# Patient Record
Sex: Female | Born: 1995 | Race: Black or African American | Hispanic: No | Marital: Single | Smoking: Never smoker
Health system: Southern US, Community
[De-identification: ages and names within clinical notes are randomized; demographics above are authoritative.]

## PROBLEM LIST (undated history)

## (undated) ENCOUNTER — Inpatient Hospital Stay (HOSPITAL_COMMUNITY): Payer: Self-pay

## (undated) ENCOUNTER — Inpatient Hospital Stay (HOSPITAL_COMMUNITY): Payer: BC Managed Care – PPO

## (undated) DIAGNOSIS — O039 Complete or unspecified spontaneous abortion without complication: Secondary | ICD-10-CM

## (undated) DIAGNOSIS — F32A Depression, unspecified: Secondary | ICD-10-CM

## (undated) DIAGNOSIS — O210 Mild hyperemesis gravidarum: Secondary | ICD-10-CM

## (undated) DIAGNOSIS — I10 Essential (primary) hypertension: Secondary | ICD-10-CM

## (undated) DIAGNOSIS — E0590 Thyrotoxicosis, unspecified without thyrotoxic crisis or storm: Secondary | ICD-10-CM

## (undated) HISTORY — DX: Complete or unspecified spontaneous abortion without complication: O03.9

---
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
## 2014-06-12 ENCOUNTER — Other Ambulatory Visit: Payer: Self-pay | Admitting: Obstetrics and Gynecology

## 2014-06-12 DIAGNOSIS — O3680X0 Pregnancy with inconclusive fetal viability, not applicable or unspecified: Secondary | ICD-10-CM

## 2014-06-15 ENCOUNTER — Other Ambulatory Visit: Payer: Self-pay

## 2014-06-16 ENCOUNTER — Ambulatory Visit: Payer: Self-pay

## 2014-06-22 ENCOUNTER — Ambulatory Visit (INDEPENDENT_AMBULATORY_CARE_PROVIDER_SITE_OTHER): Payer: BLUE CROSS/BLUE SHIELD

## 2014-06-22 ENCOUNTER — Encounter: Payer: Self-pay | Admitting: Women's Health

## 2014-06-22 ENCOUNTER — Other Ambulatory Visit: Payer: Self-pay | Admitting: Obstetrics and Gynecology

## 2014-06-22 ENCOUNTER — Ambulatory Visit (INDEPENDENT_AMBULATORY_CARE_PROVIDER_SITE_OTHER): Payer: BLUE CROSS/BLUE SHIELD | Admitting: Women's Health

## 2014-06-22 VITALS — BP 128/70 | Wt 145.0 lb

## 2014-06-22 DIAGNOSIS — O3680X0 Pregnancy with inconclusive fetal viability, not applicable or unspecified: Secondary | ICD-10-CM

## 2014-06-22 DIAGNOSIS — O021 Missed abortion: Secondary | ICD-10-CM

## 2014-06-22 NOTE — Patient Instructions (Signed)
Miscarriage A miscarriage is the sudden loss of an unborn baby (fetus) before the 20th week of pregnancy. Most miscarriages happen in the first 3 months of pregnancy. Sometimes, it happens before a woman even knows she is pregnant. A miscarriage is also called a "spontaneous miscarriage" or "early pregnancy loss." Having a miscarriage can be an emotional experience. Talk with your caregiver about any questions you may have about miscarrying, the grieving process, and your future pregnancy plans. CAUSES   Problems with the fetal chromosomes that make it impossible for the baby to develop normally. Problems with the baby's genes or chromosomes are most often the result of errors that occur, by chance, as the embryo divides and grows. The problems are not inherited from the parents.  Infection of the cervix or uterus.   Hormone problems.   Problems with the cervix, such as having an incompetent cervix. This is when the tissue in the cervix is not strong enough to hold the pregnancy.   Problems with the uterus, such as an abnormally shaped uterus, uterine fibroids, or congenital abnormalities.   Certain medical conditions.   Smoking, drinking alcohol, or taking illegal drugs.   Trauma.  Often, the cause of a miscarriage is unknown.  SYMPTOMS   Vaginal bleeding or spotting, with or without cramps or pain.  Pain or cramping in the abdomen or lower back.  Passing fluid, tissue, or blood clots from the vagina. DIAGNOSIS  Your caregiver will perform a physical exam. You may also have an ultrasound to confirm the miscarriage. Blood or urine tests may also be ordered. TREATMENT   Sometimes, treatment is not necessary if you naturally pass all the fetal tissue that was in the uterus. If some of the fetus or placenta remains in the body (incomplete miscarriage), tissue left behind may become infected and must be removed. Usually, a dilation and curettage (D and C) procedure is performed.  During a D and C procedure, the cervix is widened (dilated) and any remaining fetal or placental tissue is gently removed from the uterus.  Antibiotic medicines are prescribed if there is an infection. Other medicines may be given to reduce the size of the uterus (contract) if there is a lot of bleeding.  If you have Rh negative blood and your baby was Rh positive, you will need a Rh immunoglobulin shot. This shot will protect any future baby from having Rh blood problems in future pregnancies. HOME CARE INSTRUCTIONS   Your caregiver may order bed rest or may allow you to continue light activity. Resume activity as directed by your caregiver.  Have someone help with home and family responsibilities during this time.   Keep track of the number of sanitary pads you use each day and how soaked (saturated) they are. Write down this information.   Do not use tampons. Do not douche or have sexual intercourse until approved by your caregiver.   Only take over-the-counter or prescription medicines for pain or discomfort as directed by your caregiver.   Do not take aspirin. Aspirin can cause bleeding.   Keep all follow-up appointments with your caregiver.   If you or your partner have problems with grieving, talk to your caregiver or seek counseling to help cope with the pregnancy loss. Allow enough time to grieve before trying to get pregnant again.  SEEK IMMEDIATE MEDICAL CARE IF:   You have severe cramps or pain in your back or abdomen.  You have a fever.  You pass large blood clots (walnut-sized   or larger) ortissue from your vagina. Save any tissue for your caregiver to inspect.   Your bleeding increases.   You have a thick, bad-smelling vaginal discharge.  You become lightheaded, weak, or you faint.   You have chills.  MAKE SURE YOU:  Understand these instructions.  Will watch your condition.  Will get help right away if you are not doing well or get  worse. Document Released: 10/11/2000 Document Revised: 08/12/2012 Document Reviewed: 06/06/2011 Exit[employer] Patient Information 2015 [employer]. This information is not intended to replace advice given to you by your health care provider. Make sure you discuss any questions you have with your health care provider.  

## 2014-06-22 NOTE — Progress Notes (Signed)
Patient ID: Deborah Evans, female   DOB: Nov 08, 1995, 19 y.o.   MRN: [id number]   [facility]  Patient name: Deborah Evans MRN [id number]  Date of birth: Nov 08, 1995  CC & HPI:  Deborah Evans is a 19 y.o. G1P0 African American female at 7022w0d by certain LMP here today for dating u/s and fetus was found to have no heartbeat. CRL measured 9524w1d. Denies n/v, cramping, vb. Does desire pregnancy. Has been taking pnv.   Pertinent History Reviewed:  Medical & Surgical Hx:   Past Medical History  Diagnosis Date  . Medical history non-contributory    Past Surgical History  Procedure Laterality Date  . No past surgeries     Medications: Reviewed & Updated - see associated section Social History: Reviewed -  reports that she has never smoked. She does not have any smokeless tobacco history on file.  Objective Findings:  Vitals: BP 128/70 mmHg  Wt 145 lb (65.772 kg)  LMP 04/27/2014 (Exact Date)  Physical Examination: General appearance - alert, well appearing, and in no distress Abdomen - soft, nontender  No results found for this or any previous visit (from the past 24 hour(s)).   Assessment & Plan:  A:   G1P0010  7824w1d by CRL  Missed Ab P:  CBC, ABO, HCG today   F/U 1d for visit w/ MD to discuss/schedule D&C  Continue pnv  Wait 3 months before trying to conceive  Marge DuncansBooker, Kimberly Randall CNM, [facility]NP-BC 06/22/2014 3:38 PM

## 2014-06-22 NOTE — Progress Notes (Signed)
This encounter was created in error - please disregard.

## 2014-06-23 ENCOUNTER — Ambulatory Visit (INDEPENDENT_AMBULATORY_CARE_PROVIDER_SITE_OTHER): Payer: BLUE CROSS/BLUE SHIELD | Admitting: Obstetrics & Gynecology

## 2014-06-23 ENCOUNTER — Telehealth: Payer: Self-pay | Admitting: *Deleted

## 2014-06-23 ENCOUNTER — Encounter: Payer: Self-pay | Admitting: Obstetrics & Gynecology

## 2014-06-23 VITALS — BP 116/70 | HR 72 | Wt 143.0 lb

## 2014-06-23 DIAGNOSIS — O021 Missed abortion: Secondary | ICD-10-CM | POA: Diagnosis not present

## 2014-06-23 LAB — HCG, QUANTITATIVE, PREGNANCY: hCG Quant: 89411 m[IU]/mL

## 2014-06-23 LAB — CBC
HEMATOCRIT: 38.3 % (ref 34.0–46.6)
Hemoglobin: 12.2 g/dL (ref 11.1–15.9)
MCH: 25.7 pg — ABNORMAL LOW (ref 26.6–33.0)
MCHC: 31.9 g/dL (ref 31.5–35.7)
MCV: 81 fL (ref 79–97)
PLATELETS: 236 10*3/uL (ref 150–379)
RBC: 4.74 x10E6/uL (ref 3.77–5.28)
RDW: 13.7 % (ref 12.3–15.4)
WBC: 6.4 10*3/uL (ref 3.4–10.8)

## 2014-06-23 LAB — ABO/RH: RH TYPE: POSITIVE

## 2014-06-23 MED ORDER — MISOPROSTOL 200 MCG PO TABS: ORAL_TABLET | ORAL | Status: DC

## 2014-06-23 MED ORDER — HYDROCODONE-ACETAMINOPHEN 5-325 MG PO TABS: 1.0000 | ORAL_TABLET | Freq: Four times a day (QID) | ORAL | Status: DC | PRN

## 2014-06-23 NOTE — Progress Notes (Signed)
Patient ID: Deborah Evans, female   DOB: [birth year], 19 y.o.   MRN: [id number] Chief Complaint  Patient presents with  . Pre-op Exam    missed abortion.     19 y.o. female G1P0 9 weeks 1d non viable  Fetus by CRL by sonogram on 06/22/2014 The patient is not currently bleeding or having any pain  She is without complaints today she is without complaints today  The patient has been brought back in today to discuss her options for management of the pregnancy loss.  She was Seen yesterday by Shawna Clamp CNM for her initial OB visit at which time she was informed that she had an early pregnancy loss. Her initial thought was to have a surgical removal by D&C.  Today we discussed all the options.  I informed the patient that spontaneous miscarriage she is either truly spontaneous or by use of Cytotec carries with it and improved pregnancy outcome for future pregnancies including preterm delivery preterm labor and pregnancy loss as compared to operative pregnancy evacuations. I discussed with the patient the complications of an operative D&C which include but are not limited to infection, Asherman syndrome, perforation, and delayed excessive bleeding.  She understands at this gestation there is about a 10% chance of needing a D&C anyway.  We discussed the use of Cytotec versus waiting for spontaneous pregnancy loss.  Questions were answered regarding these options.  Patient at the time of the visit was unsure which pass she would take as far as spontaneous versus Cytotec As a result I prescribed Cytotec 800 g with 1 refill if she decides to go that route as well as Lortab as needed for cramping.  I told her she could also use Tylenol but I would recommend not using prostaglandin inhibitors because that's what the Cytotec is actually doing which could limit its effectiveness  The sonogram images are reviewed as well as the sonogram report The patient's labs are reviewed and listed below including a  blood type of O+ screen she does not require RhoGAM  Results for orders placed or performed in visit on 06/22/14 (from the past 72 hour(s))  CBC     Status: Abnormal   Collection Time: 06/22/14  3:44 PM  Result Value Ref Range   WBC 6.4 3.4 - 10.8 x10E3/uL   RBC 4.74 3.77 - 5.28 x10E6/uL   Hemoglobin 12.2 11.1 - 15.9 g/dL   HCT 40.9 81.1 - 91.4 %   MCV 81 79 - 97 fL   MCH 25.7 (L) 26.6 - 33.0 pg   MCHC 31.9 31.5 - 35.7 g/dL   RDW 78.2 95.6 - 21.3 %   Platelets 236 150 - 379 x10E3/uL  hCG, quantitative, pregnancy     Status: None   Collection Time: 06/22/14  3:44 PM  Result Value Ref Range   hCG Quant 08657 mIU/mL    Comment:                      Female (Non-pregnant)    0 -     5                             (Postmenopausal)  0 -     8                      Female (Pregnant)  Weeks of Gestation                              3                6 -    71                              4               10 -   750                              5              217 -  7138                              6              158 - [place]                              7             3697 -[place]  18             8099 - 58[address] Roche E CLIA methodology   ABO/Rh     Status: None   Collection Time: 06/22/14  3:44 PM  Result Value Ref Range   ABO Grouping O    Rh Factor Positive     Comment: Please note: Prior records for this patient's ABO / Rh type are not available for additional verification.    Past Medical  History  Diagnosis Date  . Medical history non-contributory     Past Surgical History  Procedure Laterality Date  . No past surgeries      OB History    Gravida Para Term Preterm AB TAB SAB Ectopic Multiple Living   1               No Known Allergies  History   Social History  . Marital Status: Single    Spouse Name: N/A  . Number of Children: N/A  . Years of Education: N/A   Social History Main Topics  . Smoking status: Never Smoker   . Smokeless tobacco: Not on file  . Alcohol Use: No  . Drug Use: No  . Sexual Activity: Not on file   Other Topics Concern  . None   Social History Narrative    History reviewed. No pertinent family history.  Impression: Missed ab at 9167w1d  Plan: cytotec as above lortab prn  follow up 2 weeks or earlier if needed  Face-to-face time 20 minutes Greater than 50% of the time with the patient was spent in counseling and answering questions regarding management which is summarized above

## 2014-06-23 NOTE — Telephone Encounter (Signed)
Pt wanted to know what pharmacy her medication was sent to.

## 2014-07-07 ENCOUNTER — Encounter: Payer: Self-pay | Admitting: Obstetrics & Gynecology

## 2014-07-07 ENCOUNTER — Other Ambulatory Visit: Payer: Self-pay | Admitting: Obstetrics & Gynecology

## 2014-07-07 ENCOUNTER — Ambulatory Visit (INDEPENDENT_AMBULATORY_CARE_PROVIDER_SITE_OTHER): Payer: BLUE CROSS/BLUE SHIELD | Admitting: Obstetrics & Gynecology

## 2014-07-07 ENCOUNTER — Ambulatory Visit (INDEPENDENT_AMBULATORY_CARE_PROVIDER_SITE_OTHER): Payer: BLUE CROSS/BLUE SHIELD

## 2014-07-07 VITALS — BP 130/80 | HR 84 | Wt 143.0 lb

## 2014-07-07 DIAGNOSIS — O021 Missed abortion: Secondary | ICD-10-CM

## 2014-07-07 DIAGNOSIS — O0339 Incomplete spontaneous abortion with other complications: Secondary | ICD-10-CM | POA: Insufficient documentation

## 2014-07-07 DIAGNOSIS — N719 Inflammatory disease of uterus, unspecified: Secondary | ICD-10-CM | POA: Insufficient documentation

## 2014-07-07 MED ORDER — CIPROFLOXACIN HCL 500 MG PO TABS: 500.0000 mg | ORAL_TABLET | Freq: Two times a day (BID) | ORAL | Status: DC

## 2014-07-07 NOTE — Progress Notes (Signed)
Patient ID: Deborah Evans, female   DOB: [birth year], 19 y.o.   MRN: [id number]   Chief Complaint  Patient presents with  . Follow-up     HPI:    Had missed ab status post cytotec.  Experienced bleeding and cramping and passage of tissue after the cytotec, bleeding lasted 5 days  Location:  Vaginal . Quality:  cramps. Severity:  mild. Timing:  daily Duration:  5 days. Context:  After cytotec. Modifying factors:   Signs/Symptoms:    Problem Pertinent ROS:       No burning with urination, frequency or urgency No nausea, vomiting or diarrhea Nor fever chills or other constitutional symptoms No back or abdominal pain   Extended ROS:        PMFSH:             Past Medical History  Diagnosis Date  . Medical history non-contributory     Past Surgical History  Procedure Laterality Date  . No past surgeries      OB History    Gravida Para Term Preterm AB TAB SAB Ectopic Multiple Living   1               No Known Allergies  History   Social History  . Marital Status: Single    Spouse Name: N/A  . Number of Children: N/A  . Years of Education: N/A   Social History Main Topics  . Smoking status: Never Smoker   . Smokeless tobacco: Not on file  . Alcohol Use: No  . Drug Use: No  . Sexual Activity: Not on file   Other Topics Concern  . None   Social History Narrative    History reviewed. No pertinent family history.   Examination:  Vitals:  Blood pressure 130/80, pulse 84, weight 143 lb (64.864 kg), last menstrual period 07/03/2014.    Physical Examination:    Gen  WDWN female in NAD Abdomen:  Soft, non-tender, normal bowel sounds; no hepatosplenomegaly, organomegaly or masses. Vulva:  NEFG, normal appearing vulva with no masses, tenderness or lesions,  Vagina:  normal mucosa, scant blood, slight odor noted, concern for retained POC and infection Cervix small amount of tissue seen at os Uterus NSSC mild tenderness Adnexa no masses mild  tenderness Back No CVAT or low back pain     DATA orders and reviews: Labs were not ordered today:   Imaging studies wereordered today:    Lab tests were reviewed today:   ABO Imaging studies were reviewed today:  Previous sonogram  I did independently review/view images, tracing or specimen(not simply the report) myself.  Today's sonogram, I was in the room during the examination  US Transvaginal Non-ob  07/07/2014   GYNECOLOGIC SONOGRAM   Deborah Evans is a 19 y.o. G1P0  for a pelvic sonogram for f/u MAB  ?retained POC.  Uterus                      Anteverted   Endometrium          With area of ?tissue noted within the lower uterine  segment  Right ovary             5.3 x 4.7 x 3.5 cm, with 4.3 x 4.2 x 3.1cm simple  cyst noted   Left ovary                2.8 x 2.0 x 1.9 cm,   No free fluid noted  within the pelvis  Technician Comments:  Anteverted uterus, Endometrium with Tissue noted within the lower uterine  segment noted, rt ovary with simple cyst noted= 4.3 x 4.2 x 3.1cm, lt  ovary appears WNL   Chari ManningMcBride, Tasha 07/07/2014 12:14 PM  Clinical Impression and recommendations:  I have reviewed the sonogram results above, combined with the patient's  current clinical course, below are my impressions and any appropriate  recommendations for management based on the sonographic findings.  Incomplete missed abortion after treatment with cytotec x 1   Leighann Amadon H 07/07/2014 12:21 PM      Prescription Drug Management:  New Prescriptions: Ciprofloxacin 500 BID x 10 days Renewed Prescriptions:  cytotec 800 micrograms Current prescription changes:     Impression/Plan(Problem Based): 1.  Incomplete missed Ab      (follow up of a pre-existing problem which is worsening) : Additional workup is not needed:  Repeat Cytotec 800 micrograms, follow up with Dr Despina HiddenEure in 1 week    2.  Possible endometritis      (new problem) : Additional workup is not needed at present:  Cipro 500 BID x 7 days, even if need D&C  want to pretreat with cipro     Follow Up:   1  weeks     Face to face time:  na

## 2014-07-14 ENCOUNTER — Encounter (HOSPITAL_COMMUNITY)
Admission: RE | Admit: 2014-07-14 | Discharge: 2014-07-14 | Disposition: A | Discharge status: Home / Self Care | Payer: BLUE CROSS/BLUE SHIELD | Source: Ambulatory Visit | Attending: Obstetrics & Gynecology | Admitting: Obstetrics & Gynecology

## 2014-07-14 ENCOUNTER — Ambulatory Visit (INDEPENDENT_AMBULATORY_CARE_PROVIDER_SITE_OTHER): Payer: BLUE CROSS/BLUE SHIELD | Admitting: Obstetrics & Gynecology

## 2014-07-14 ENCOUNTER — Encounter (HOSPITAL_COMMUNITY): Payer: Self-pay

## 2014-07-14 ENCOUNTER — Encounter: Payer: Self-pay | Admitting: Obstetrics & Gynecology

## 2014-07-14 VITALS — BP 130/80 | HR 84 | Wt 145.0 lb

## 2014-07-14 DIAGNOSIS — O021 Missed abortion: Secondary | ICD-10-CM

## 2014-07-14 DIAGNOSIS — O0339 Incomplete spontaneous abortion with other complications: Secondary | ICD-10-CM

## 2014-07-14 DIAGNOSIS — O030 Genital tract and pelvic infection following incomplete spontaneous abortion: Secondary | ICD-10-CM | POA: Diagnosis not present

## 2014-07-14 DIAGNOSIS — Z3A08 8 weeks gestation of pregnancy: Secondary | ICD-10-CM | POA: Diagnosis not present

## 2014-07-14 DIAGNOSIS — N719 Inflammatory disease of uterus, unspecified: Secondary | ICD-10-CM

## 2014-07-14 DIAGNOSIS — O034 Incomplete spontaneous abortion without complication: Secondary | ICD-10-CM | POA: Diagnosis present

## 2014-07-14 LAB — COMPREHENSIVE METABOLIC PANEL
ALBUMIN: 4 g/dL (ref 3.5–5.2)
ALT: 10 U/L (ref 0–35)
ANION GAP: 5 (ref 5–15)
AST: 21 U/L (ref 0–37)
Alkaline Phosphatase: 82 U/L (ref 39–117)
BUN: 14 mg/dL (ref 6–23)
CALCIUM: 9.5 mg/dL (ref 8.4–10.5)
CO2: 27 mmol/L (ref 19–32)
CREATININE: 0.63 mg/dL (ref 0.50–1.10)
Chloride: 107 mmol/L (ref 96–112)
GFR calc Af Amer: >90 mL/min (ref >90)
GFR calc non Af Amer: >90 mL/min (ref >90)
Glucose, Bld: 101 mg/dL — ABNORMAL HIGH (ref 70–99)
Potassium: 4.3 mmol/L (ref 3.5–5.1)
Sodium: 139 mmol/L (ref 135–145)
Total Bilirubin: 0.6 mg/dL (ref 0.3–1.2)
Total Protein: 7.2 g/dL (ref 6.0–8.3)

## 2014-07-14 LAB — URINALYSIS, ROUTINE W REFLEX MICROSCOPIC
BILIRUBIN URINE: NEGATIVE
Glucose, UA: NEGATIVE mg/dL
KETONES UR: NEGATIVE mg/dL
Nitrite: NEGATIVE
PH: 7.5 (ref 5.0–8.0)
SPECIFIC GRAVITY, URINE: 1.02 (ref 1.005–1.030)
Urobilinogen, UA: 4 mg/dL — ABNORMAL HIGH (ref 0.0–1.0)

## 2014-07-14 LAB — CBC
HEMATOCRIT: 34.1 % — AB (ref 36.0–46.0)
Hemoglobin: 10.9 g/dL — ABNORMAL LOW (ref 12.0–15.0)
MCH: 25.8 pg — AB (ref 26.0–34.0)
MCHC: 32 g/dL (ref 30.0–36.0)
MCV: 80.6 fL (ref 78.0–100.0)
Platelets: 270 10*3/uL (ref 150–400)
RBC: 4.23 MIL/uL (ref 3.87–5.11)
RDW: 13.1 % (ref 11.5–15.5)
WBC: 7.5 10*3/uL (ref 4.0–10.5)

## 2014-07-14 LAB — URINE MICROSCOPIC-ADD ON

## 2014-07-14 NOTE — Progress Notes (Signed)
Patient ID: Deborah Evans, female   DOB: 1995-09-05, 19 y.o.   MRN: [id number] Preoperative History and Physical  Deborah Evans is a 19 y.o. G1P0 with Patient's last menstrual period was 07/03/2014. admitted for a cervical dilation with suction curettage for incomplete ab.  P;lease refer to my note and sonogram from last week.  She took the cytotec this week but had only spotting, on exam there is still products of conception in the lower uterine segment. As a result will proceed with operative management. Pt has been taking cipro this week due to what I felt was endometritis  PMH:    Past Medical History  Diagnosis Date  . Medical history non-contributory     PSH:     Past Surgical History  Procedure Laterality Date  . No past surgeries      POb/GynH:      OB History    Gravida Para Term Preterm AB TAB SAB Ectopic Multiple Living   1               SH:   History  Substance Use Topics  . Smoking status: Never Smoker   . Smokeless tobacco: Not on file  . Alcohol Use: No    FH:   History reviewed. No pertinent family history.   Allergies: No Known Allergies  Medications:       Current outpatient prescriptions:  .  ciprofloxacin (CIPRO) 500 MG tablet, Take 1 tablet (500 mg total) by mouth 2 (two) times daily. (Patient not taking: Reported on 07/14/2014), Disp: 614 tablet, Rfl: 0 .  HYDROcodone-acetaminophen (NORCO/VICODIN) 5-325 MG per tablet, Take 1 tablet by mouth every 6 (six) hours as needed. (Patient not taking: Reported on 07/07/2014), Disp: 10 tablet, Rfl: 0 .  misoprostol (CYTOTEC) 200 MCG tablet, Take 4 tablets at once orally (Patient not taking: Reported on 07/07/2014), Disp: 4 tablet, Rfl: 1 .  Prenatal Vit-Fe Fumarate-FA (MULTIVITAMIN-PRENATAL) 27-0.8 MG TABS tablet, Take 1 tablet by mouth daily at 12 noon., Disp: , Rfl:   Review of Systems:   Review of Systems  Constitutional: Negative for fever, chills, weight loss, malaise/fatigue and diaphoresis.  HENT:  Negative for hearing loss, ear pain, nosebleeds, congestion, sore throat, neck pain, tinnitus and ear discharge.   Eyes: Negative for blurred vision, double vision, photophobia, pain, discharge and redness.  Respiratory: Negative for cough, hemoptysis, sputum production, shortness of breath, wheezing and stridor.   Cardiovascular: Negative for chest pain, palpitations, orthopnea, claudication, leg swelling and PND.  Gastrointestinal: Positive for abdominal pain. Negative for heartburn, nausea, vomiting, diarrhea, constipation, blood in stool and melena.  Genitourinary: Negative for dysuria, urgency, frequency, hematuria and flank pain.  Musculoskeletal: Negative for myalgias, back pain, joint pain and falls.  Skin: Negative for itching and rash.  Neurological: Negative for dizziness, tingling, tremors, sensory change, speech change, focal weakness, seizures, loss of consciousness, weakness and headaches.  Endo/Heme/Allergies: Negative for environmental allergies and polydipsia. Does not bruise/bleed easily.  Psychiatric/Behavioral: Negative for depression, suicidal ideas, hallucinations, memory loss and substance abuse. The patient is not nervous/anxious and does not have insomnia.      PHYSICAL EXAM:  Blood pressure 130/80, pulse 84, weight 145 lb (65.772 kg), last menstrual period 07/03/2014.    Vitals reviewed. Constitutional: She is oriented to person, place, and time. She appears well-developed and well-nourished.  HENT:  Head: Normocephalic and atraumatic.  Right Ear: External ear normal.  Left Ear: External ear normal.  Nose: Nose normal.  Mouth/Throat: Oropharynx is clear  and moist.  Eyes: Conjunctivae and EOM are normal. Pupils are equal, round, and reactive to light. Right eye exhibits no discharge. Left eye exhibits no discharge. No scleral icterus.  Neck: Normal range of motion. Neck supple. No tracheal deviation present. No thyromegaly present.  Cardiovascular: Normal rate,  regular rhythm, normal heart sounds and intact distal pulses.  Exam reveals no gallop and no friction rub.   No murmur heard. Respiratory: Effort normal and breath sounds normal. No respiratory distress. She has no wheezes. She has no rales. She exhibits no tenderness.  GI: Soft. Bowel sounds are normal. She exhibits no distension and no mass. There is tenderness. There is no rebound and no guarding.  Genitourinary:       Vulva is normal without lesions Vagina is pink moist without discharge Cervix normal in appearance tissue is present as it was last week Uterus is normal size Adnexa is negative with normal sized ovaries by sonogram  Musculoskeletal: Normal range of motion. She exhibits no edema and no tenderness.  Neurological: She is alert and oriented to person, place, and time. She has normal reflexes. She displays normal reflexes. No cranial nerve deficit. She exhibits normal muscle tone. Coordination normal.  Skin: Skin is warm and dry. No rash noted. No erythema. No pallor.  Psychiatric: She has a normal mood and affect. Her behavior is normal. Judgment and thought content normal.    Labs: No results found for this or any previous visit (from the past 336 hour(s)).  EKG: No orders found for this or any previous visit.  Imaging Studies: [place] Ob Comp Less 14 Wks  06/23/2014   DATING AND VIABILITY SONOGRAM   Deborah Evans is a 19 y.o. year old G1P0 with LMP 04/27/2014 which would  correlate to  8+[redacted] weeks gestation.  She has regular menstrual cycles.    She is here today for a confirmatory initial sonogram.    GESTATION: SINGLETON     FETAL ACTIVITY:          Heart rate         NO FCA NOTED           The fetus is inactive.  CERVIX: Appears closed   ADNEXA: The ovaries are abnormal Rt ovary with cyst= 5.6 x 5.1x 4.7cm (simple  cyst)  GESTATIONAL AGE AND  BIOMETRICS:  Gestational criteria: Estimated Date of Delivery: 02/01/2015  by LMP now at  8+0 weeks  Previous Scans:0  GESTATIONAL SAC             mm          weeks  CROWN RUMP LENGTH           24 mm         9+1 weeks                                                                               AVERAGE EGA(BY THIS SCAN):   9+1 weeks  TECHNICIAN COMMENTS:  U/S-single IUP with NO FCA NOTED on today's exam, CRL c/w 9+1 wks, cx  appears closed, Rt ovary with 5.6 x 5.1x 4.7cm cyst noted, no free fluid  noted within the pelvis, pt was worked  into Kim's schedule per  [email or web address]    A copy of this report including all images has been saved and backed up to  a second source for retrieval if needed. All measures and details of the  anatomical scan, placentation, fluid volume and pelvic anatomy are  contained in that report.  Chari Manning 06/22/2014 2:48 PM Clinical Impression and recommendations:  I have reviewed the sonogram results above.  Combined with the patient's current clinical course, below are my  impressions and any appropriate recommendations for management based on  the sonographic findings:  1. Nonviable pregnancy at 9w 1 d, based on CRL , sufficiently far in to  pregnancy that inevitability of pregnancy loss is certain. 2. Case reviewed with Dr Despina Hidden and Pt deciding to proceed at this time to  use cytotec for expediting delivery.  FERGUSON,JOHN V   US Transvaginal Non-ob  07/07/2014   GYNECOLOGIC SONOGRAM   Deborah Evans is a 19 y.o. G1P0  for a pelvic sonogram for f/u MAB  ?retained POC.  Uterus                      Anteverted   Endometrium          With area of ?tissue noted within the lower uterine  segment  Right ovary             5.3 x 4.7 x 3.5 cm, with 4.3 x 4.2 x 3.1cm simple  cyst noted   Left ovary                2.8 x 2.0 x 1.9 cm,   No free fluid noted within the pelvis  Technician Comments:  Anteverted uterus, Endometrium with Tissue noted within the lower uterine  segment noted, rt ovary with simple cyst noted= 4.3 x 4.2 x 3.1cm, lt  ovary appears WNL   Chari Manning 07/07/2014 12:14 PM  Clinical Impression and recommendations:  I  have reviewed the sonogram results above, combined with the patient's  current clinical course, below are my impressions and any appropriate  recommendations for management based on the sonographic findings.  Incomplete missed abortion after treatment with cytotec x 1   Annasofia Pohl H 07/07/2014 12:21 PM        Assessment: Patient Active Problem List   Diagnosis Date Noted  . Incomplete abortion with infection 07/07/2014  . Endometritis 07/07/2014  . Missed abortion 06/22/2014  still unresponsive to cipro and repeated cytotec  Plan: Cervical dilation and suction curettage 07/15/2014 Pt understands and agrees Orders are done  Blood type is O positive  Sonya Gunnoe H 07/14/2014 9:58 AM

## 2014-07-14 NOTE — Patient Instructions (Signed)
Len Kluver [place]  07/14/2014   Your procedure is scheduled on:   07/15/2014  Report to [facility] at  840  AM.  Call this number if you have problems the morning of surgery: [phone]   Remember:   Do not eat food or drink liquids after midnight.   Take these medicines the morning of surgery with A SIP OF WATER:  none   Do not wear jewelry, make-up or nail polish.  Do not wear lotions, powders, or perfumes.   Do not shave 48 hours prior to surgery. Men may shave face and neck.  Do not bring valuables to the hospital.  [facility] is not responsible for any belongings or valuables.               Contacts, dentures or bridgework may not be worn into surgery.  Leave suitcase in the car. After surgery it may be brought to your room.  For patients admitted to the hospital, discharge time is determined by your treatment team.               Patients discharged the day of surgery will not be allowed to drive home.  Name and phone number of your driver: family  Special Instructions: Shower using CHG 2 nights before surgery and the night before surgery.  If you shower the day of surgery use CHG.  Use special wash - you have one bottle of CHG for all showers.  You should use approximately 1/3 of the bottle for each shower.   Please read over the following fact sheets that you were given: Pain Booklet, Coughing and Deep Breathing, Surgical Site Infection Prevention, Anesthesia Post-op Instructions and Care and Recovery After Surgery Dilation and Curettage or Vacuum Curettage Dilation and curettage (D&C) and vacuum curettage are minor procedures. A D&C involves stretching (dilation) the cervix and scraping (curettage) the inside lining of the womb (uterus). During a D&C, tissue is gently scraped from the inside lining of the uterus. During a vacuum curettage, the lining and tissue in the uterus are removed with the use of gentle suction.  Curettage may be performed to either diagnose or  treat a problem. As a diagnostic procedure, curettage is performed to examine tissues from the uterus. A diagnostic curettage may be performed for the following symptoms:   Irregular bleeding in the uterus.   Bleeding with the development of clots.   Spotting between menstrual periods.   Prolonged menstrual periods.   Bleeding after menopause.   No menstrual period (amenorrhea).   A change in size and shape of the uterus.  As a treatment procedure, curettage may be performed for the following reasons:   Removal of an IUD (intrauterine device).   Removal of retained placenta after giving birth. Retained placenta can cause an infection or bleeding severe enough to require transfusions.   Abortion.   Miscarriage.   Removal of polyps inside the uterus.   Removal of uncommon types of noncancerous lumps (fibroids).  LET [facility] CARE PROVIDER KNOW ABOUT:   Any allergies you have.   All medicines you are taking, including vitamins, herbs, eye drops, creams, and over-the-counter medicines.   Previous problems you or members of your family have had with the use of anesthetics.   Any blood disorders you have.   Previous surgeries you have had.   Medical conditions you have. RISKS AND COMPLICATIONS  Generally, this is a safe procedure. However, as with any procedure, complications can  occur. Possible complications include:  Excessive bleeding.   Infection of the uterus.   Damage to the cervix.   Development of scar tissue (adhesions) inside the uterus, later causing abnormal amounts of menstrual bleeding.   Complications from the general anesthetic, if a general anesthetic is used.   Putting a hole (perforation) in the uterus. This is rare.  BEFORE THE PROCEDURE   Eat and drink before the procedure only as directed by your health care provider.   Arrange for someone to take you home.  PROCEDURE  This procedure usually takes about 15-30  minutes.  You will be given one of the following:  A medicine that numbs the area in and around the cervix (local anesthetic).   A medicine to make you sleep through the procedure (general anesthetic).  You will lie on your back with your legs in stirrups.   A warm metal or plastic instrument (speculum) will be placed in your vagina to keep it open and to allow the health care provider to see the cervix.  There are two ways in which your cervix can be softened and dilated. These include:   Taking a medicine.   Having thin rods (laminaria) inserted into your cervix.   A curved tool (curette) will be used to scrape cells from the inside lining of the uterus. In some cases, gentle suction is applied with the curette. The curette will then be removed.  AFTER THE PROCEDURE   You will rest in the recovery area until you are stable and are ready to go home.   You may feel sick to your stomach (nauseous) or throw up (vomit) if you were given a general anesthetic.   You may have a sore throat if a tube was placed in your throat during general anesthesia.   You may have light cramping and bleeding. This may last for 2 days to 2 weeks after the procedure.   Your uterus needs to make a new lining after the procedure. This may make your next period late. Document Released: 04/17/2005 Document Revised: 12/18/2012 Document Reviewed: 11/14/2012 [facility] Patient Information 2015 [place], [place]. This information is not intended to replace advice given to you by your health care provider. Make sure you discuss any questions you have with your health care provider. PATIENT INSTRUCTIONS POST-ANESTHESIA  IMMEDIATELY FOLLOWING SURGERY:  Do not drive or operate machinery for the first twenty four hours after surgery.  Do not make any important decisions for twenty four hours after surgery or while taking narcotic pain medications or sedatives.  If you develop intractable nausea and vomiting or  a severe headache please notify your doctor immediately.  FOLLOW-UP:  Please make an appointment with your surgeon as instructed. You do not need to follow up with anesthesia unless specifically instructed to do so.  WOUND CARE INSTRUCTIONS (if applicable):  Keep a dry clean dressing on the anesthesia/puncture wound site if there is drainage.  Once the wound has quit draining you may leave it open to air.  Generally you should leave the bandage intact for twenty four hours unless there is drainage.  If the epidural site drains for more than 36-48 hours please call the anesthesia department.  QUESTIONS?:  Please feel free to call your physician or the hospital operator if you have any questions, and they will be happy to assist you.

## 2014-07-14 NOTE — Pre-Procedure Instructions (Signed)
Patient given information to sign up for my chart at home. 

## 2014-07-15 ENCOUNTER — Ambulatory Visit (HOSPITAL_COMMUNITY): Payer: BLUE CROSS/BLUE SHIELD | Admitting: Anesthesiology

## 2014-07-15 ENCOUNTER — Ambulatory Visit (HOSPITAL_COMMUNITY)
Admission: RE | Admit: 2014-07-15 | Discharge: 2014-07-15 | Disposition: A | Discharge status: Home / Self Care | Payer: BLUE CROSS/BLUE SHIELD | Source: Ambulatory Visit | Attending: Obstetrics & Gynecology | Admitting: Obstetrics & Gynecology

## 2014-07-15 ENCOUNTER — Encounter (HOSPITAL_COMMUNITY): Payer: Self-pay | Admitting: *Deleted

## 2014-07-15 ENCOUNTER — Encounter (HOSPITAL_COMMUNITY)
Admission: RE | Disposition: A | Discharge status: Home / Self Care | Payer: Self-pay | Source: Ambulatory Visit | Attending: Obstetrics & Gynecology

## 2014-07-15 DIAGNOSIS — Z3A08 8 weeks gestation of pregnancy: Secondary | ICD-10-CM | POA: Insufficient documentation

## 2014-07-15 DIAGNOSIS — O030 Genital tract and pelvic infection following incomplete spontaneous abortion: Secondary | ICD-10-CM | POA: Diagnosis not present

## 2014-07-15 DIAGNOSIS — N719 Inflammatory disease of uterus, unspecified: Secondary | ICD-10-CM | POA: Insufficient documentation

## 2014-07-15 HISTORY — PX: DILATION AND CURETTAGE OF UTERUS: SHX78

## 2014-07-15 SURGERY — DILATION AND CURETTAGE
Anesthesia: General | Site: Uterus

## 2014-07-15 MED ORDER — FENTANYL CITRATE 0.05 MG/ML IJ SOLN: 25.0000 ug | INTRAMUSCULAR | Status: DC | PRN

## 2014-07-15 MED ORDER — 0.9 % SODIUM CHLORIDE (POUR BTL) OPTIME
TOPICAL | Status: DC | PRN
  Administered 2014-07-15: 1000 mL

## 2014-07-15 MED ORDER — FENTANYL CITRATE 0.05 MG/ML IJ SOLN
INTRAMUSCULAR | Status: DC | PRN
  Administered 2014-07-15 (×2): 50 ug via INTRAVENOUS

## 2014-07-15 MED ORDER — MIDAZOLAM HCL 2 MG/2ML IJ SOLN
INTRAMUSCULAR | Status: AC
  Filled 2014-07-15: qty 2

## 2014-07-15 MED ORDER — METHYLERGONOVINE MALEATE 0.2 MG/ML IJ SOLN
INTRAMUSCULAR | Status: AC
Start: 2014-07-15 — End: 2014-07-15
  Filled 2014-07-15: qty 1

## 2014-07-15 MED ORDER — KETOROLAC TROMETHAMINE 10 MG PO TABS: 10.0000 mg | ORAL_TABLET | Freq: Three times a day (TID) | ORAL | Status: DC | PRN

## 2014-07-15 MED ORDER — METHYLERGONOVINE MALEATE 0.2 MG/ML IJ SOLN
INTRAMUSCULAR | Status: DC | PRN
  Administered 2014-07-15: 0.2 mg via INTRAMUSCULAR

## 2014-07-15 MED ORDER — LACTATED RINGERS IV SOLN
INTRAVENOUS | Status: DC
  Administered 2014-07-15: 11:00:00 via INTRAVENOUS
  Administered 2014-07-15: 1000 mL via INTRAVENOUS

## 2014-07-15 MED ORDER — METHYLERGONOVINE MALEATE 0.2 MG PO TABS: 0.2000 mg | ORAL_TABLET | Freq: Four times a day (QID) | ORAL | Status: DC

## 2014-07-15 MED ORDER — PROPOFOL 10 MG/ML IV BOLUS
INTRAVENOUS | Status: DC | PRN
  Administered 2014-07-15: 170 mg via INTRAVENOUS
  Administered 2014-07-15: 30 mg via INTRAVENOUS
  Administered 2014-07-15 (×2): 20 mg via INTRAVENOUS

## 2014-07-15 MED ORDER — FENTANYL CITRATE 0.05 MG/ML IJ SOLN
INTRAMUSCULAR | Status: AC
  Filled 2014-07-15: qty 2

## 2014-07-15 MED ORDER — PROPOFOL 10 MG/ML IV BOLUS
INTRAVENOUS | Status: AC
  Filled 2014-07-15: qty 20

## 2014-07-15 MED ORDER — ONDANSETRON HCL 4 MG/2ML IJ SOLN: 4.0000 mg | Freq: Once | INTRAMUSCULAR | Status: DC | PRN

## 2014-07-15 MED ORDER — CEFAZOLIN SODIUM-DEXTROSE 2-3 GM-% IV SOLR
2.0000 g | INTRAVENOUS | Status: AC
  Administered 2014-07-15: 2 g via INTRAVENOUS
  Filled 2014-07-15: qty 50

## 2014-07-15 MED ORDER — KETOROLAC TROMETHAMINE 30 MG/ML IJ SOLN
30.0000 mg | Freq: Once | INTRAMUSCULAR | Status: AC
  Administered 2014-07-15: 30 mg via INTRAVENOUS
  Filled 2014-07-15: qty 1

## 2014-07-15 MED ORDER — LIDOCAINE HCL (CARDIAC) 10 MG/ML IV SOLN
INTRAVENOUS | Status: DC | PRN
  Administered 2014-07-15: 50 mg via INTRAVENOUS

## 2014-07-15 MED ORDER — ONDANSETRON HCL 4 MG/2ML IJ SOLN
4.0000 mg | Freq: Once | INTRAMUSCULAR | Status: AC
  Administered 2014-07-15: 4 mg via INTRAVENOUS

## 2014-07-15 MED ORDER — LIDOCAINE HCL (PF) 1 % IJ SOLN
INTRAMUSCULAR | Status: AC
  Filled 2014-07-15: qty 5

## 2014-07-15 MED ORDER — ONDANSETRON HCL 4 MG/2ML IJ SOLN
INTRAMUSCULAR | Status: AC
  Filled 2014-07-15: qty 2

## 2014-07-15 MED ORDER — FENTANYL CITRATE 0.05 MG/ML IJ SOLN
25.0000 ug | INTRAMUSCULAR | Status: AC
  Administered 2014-07-15 (×2): 25 ug via INTRAVENOUS

## 2014-07-15 MED ORDER — MIDAZOLAM HCL 2 MG/2ML IJ SOLN
1.0000 mg | INTRAMUSCULAR | Status: DC | PRN
  Administered 2014-07-15: 2 mg via INTRAVENOUS

## 2014-07-15 SURGICAL SUPPLY — 32 items
BAG HAMPER (MISCELLANEOUS) ×2 IMPLANT
CLOTH BEACON ORANGE TIMEOUT ST (SAFETY) ×2 IMPLANT
COVER LIGHT HANDLE STERIS (MISCELLANEOUS) ×4 IMPLANT
CURETTE VACUUM 9MM CVD CLR (CANNULA) ×2 IMPLANT
DECANTER SPIKE VIAL GLASS SM (MISCELLANEOUS) IMPLANT
FORMALIN 10 PREFIL 480ML (MISCELLANEOUS) ×2 IMPLANT
GAUZE SPONGE 4X4 16PLY XRAY LF (GAUZE/BANDAGES/DRESSINGS) ×2 IMPLANT
GLOVE BIO SURGEON STRL SZ7 (GLOVE) ×2 IMPLANT
GLOVE BIOGEL PI IND STRL 7.0 (GLOVE) ×1 IMPLANT
GLOVE BIOGEL PI IND STRL 8 (GLOVE) ×1 IMPLANT
GLOVE BIOGEL PI INDICATOR 7.0 (GLOVE) ×1
GLOVE BIOGEL PI INDICATOR 8 (GLOVE) ×1
GLOVE ECLIPSE 8.0 STRL XLNG CF (GLOVE) ×2 IMPLANT
GOWN STRL REUS W/TWL LRG LVL3 (GOWN DISPOSABLE) ×2 IMPLANT
GOWN STRL REUS W/TWL XL LVL3 (GOWN DISPOSABLE) ×2 IMPLANT
KIT BERKELEY 1ST TRIMESTER 3/8 (MISCELLANEOUS) ×2 IMPLANT
KIT ROOM TURNOVER AP CYSTO (KITS) ×2 IMPLANT
MANIFOLD NEPTUNE II (INSTRUMENTS) ×2 IMPLANT
MARKER SKIN DUAL TIP RULER LAB (MISCELLANEOUS) ×2 IMPLANT
NS IRRIG 1000ML POUR BTL (IV SOLUTION) ×2 IMPLANT
PACK BASIC III (CUSTOM PROCEDURE TRAY) ×1
PACK SRG BSC III STRL LF ECLPS (CUSTOM PROCEDURE TRAY) ×1 IMPLANT
PAD ARMBOARD 7.5X6 YLW CONV (MISCELLANEOUS) ×2 IMPLANT
SET BASIN LINEN APH (SET/KITS/TRAYS/PACK) ×2 IMPLANT
SET BERKELEY SUCTION TUBING (SUCTIONS) ×2 IMPLANT
SHEET LAVH (DRAPES) ×2 IMPLANT
SYRINGE 10CC LL (SYRINGE) IMPLANT
TOWEL OR 17X26 4PK STRL BLUE (TOWEL DISPOSABLE) IMPLANT
VACURETTE 10MM (CANNULA) IMPLANT
VACURETTE 12MM (CANNULA) IMPLANT
VACURETTE 14MM (CANNULA) IMPLANT
VACURETTE 8MM (CANNULA) ×2 IMPLANT

## 2014-07-15 NOTE — Anesthesia Procedure Notes (Signed)
Procedure Name: LMA Insertion Date/Time: 07/15/2014 10:46 AM Performed by: Franco NonesYATES, Aiyanna Awtrey S Pre-anesthesia Checklist: Patient identified, Patient being monitored, Emergency Drugs available, Timeout performed and Suction available Patient Re-evaluated:Patient Re-evaluated prior to inductionOxygen Delivery Method: Circle System Utilized Preoxygenation: Pre-oxygenation with 100% oxygen Intubation Type: IV induction Ventilation: Mask ventilation without difficulty LMA: LMA inserted LMA Size: 4.0 Number of attempts: 1 Placement Confirmation: positive ETCO2 and breath sounds checked- equal and bilateral Tube secured with: Tape

## 2014-07-15 NOTE — Transfer of Care (Signed)
Immediate Anesthesia Transfer of Care Note  Patient: Deborah Evans  Procedure(s) Performed: Procedure(s): SUCTION DILATATION AND CURETTAGE (N/A)  Patient Location: PACU  Anesthesia Type:General  Level of Consciousness: sedated and patient cooperative  Airway & Oxygen Therapy: Patient Spontanous Breathing and non-rebreather face mask  Post-op Assessment: Report given to RN, Post -op Vital signs reviewed and stable and Patient moving all extremities  Post vital signs: Reviewed and stable    Complications: No apparent anesthesia complications

## 2014-07-15 NOTE — Anesthesia Preprocedure Evaluation (Addendum)
Anesthesia Evaluation  Patient identified by MRN, date of birth, ID band Patient awake    Reviewed: Allergy & Precautions, NPO status , Patient's Chart, lab work & pertinent test results  Airway Mallampati: I  TM Distance: >3 FB     Dental  (+) Teeth Intact, Dental Advisory Given   Pulmonary neg pulmonary ROS,    breath sounds clear to auscultation       Cardiovascular negative cardio ROS   Rhythm:Regular Rate:Normal     Neuro/Psych    GI/Hepatic negative GI ROS,   Endo/Other    Renal/GU      Musculoskeletal   Abdominal   Peds  Hematology   Anesthesia Other Findings   Reproductive/Obstetrics                             Anesthesia Physical Anesthesia Plan  ASA: I  Anesthesia Plan: General   Post-op Pain Management:    Induction: Intravenous  Airway Management Planned: LMA  Additional Equipment:   Intra-op Plan:   Post-operative Plan: Extubation in OR  Informed Consent: I have reviewed the patients History and Physical, chart, labs and discussed the procedure including the risks, benefits and alternatives for the proposed anesthesia with the patient or authorized representative who has indicated his/her understanding and acceptance.     Plan Discussed with:   Anesthesia Plan Comments:         Anesthesia Quick Evaluation  

## 2014-07-15 NOTE — Discharge Instructions (Signed)
Dilation and Curettage or Vacuum Curettage, Care After  Refer to this sheet in the next few weeks. These instructions provide you with information on caring for yourself after your procedure. Your health care provider may also give you more specific instructions. Your treatment has been planned according to current medical practices, but problems sometimes occur. Call your health care provider if you have any problems or questions after your procedure.  WHAT TO EXPECT AFTER THE PROCEDURE  After your procedure, it is typical to have light cramping and bleeding. This may last for 2 days to 2 weeks after the procedure.  HOME CARE INSTRUCTIONS   · Do not drive for 24 hours.  · Wait 1 week before returning to strenuous activities.  · Take your temperature 2 times a day for 4 days and write it down. Provide these temperatures to your health care provider if you develop a fever.  · Avoid long periods of standing.  · Avoid heavy lifting, pushing, or pulling. Do not lift anything heavier than 10 pounds (4.5 kg).  · Limit stair climbing to once or twice a day.  · Take rest periods often.  · You may resume your usual diet.  · Drink enough fluids to keep your urine clear or pale yellow.  · Your usual bowel function should return. If you have constipation, you may:  ¨ Take a mild laxative with permission from your health care provider.  ¨ Add fruit and bran to your diet.  ¨ Drink more fluids.  · Take showers instead of baths until your health care provider gives you permission to take baths.  · Do not go swimming or use a hot tub until your health care provider approves.  · Try to have someone with you or available to you the first 24-48 hours, especially if you were given a general anesthetic.  · Do not douche, use tampons, or have sex (intercourse) for 2 weeks after the procedure.  · Only take over-the-counter or prescription medicines as directed by your health care provider. Do not take aspirin. It can cause  bleeding.  · Follow up with your health care provider as directed.  SEEK MEDICAL CARE IF:   · You have increasing cramps or pain that is not relieved with medicine.  · You have abdominal pain that does not seem to be related to the same area of earlier cramping and pain.  · You have bad smelling vaginal discharge.  · You have a rash.  · You are having problems with any medicine.  SEEK IMMEDIATE MEDICAL CARE IF:   · You have bleeding that is heavier than a normal menstrual period.  · You have a fever.  · You have chest pain.  · You have shortness of breath.  · You feel dizzy or feel like fainting.  · You pass out.  · You have pain in your shoulder strap area.  · You have heavy vaginal bleeding with or without blood clots.  MAKE SURE YOU:   · Understand these instructions.  · Will watch your condition.  · Will get help right away if you are not doing well or get worse.  Document Released: 04/14/2000 Document Revised: 04/22/2013 Document Reviewed: 11/14/2012  ExitCare® Patient Information ©2015 Exit[employer], LLC. This information is not intended to replace advice given to you by your health care provider. Make sure you discuss any questions you have with your health care provider.

## 2014-07-15 NOTE — Op Note (Signed)
Preoperative diagnosis:  Incomplete Ab with possible infection  Postoperative diagnosis:  Same as above  Procedure:  Cervical dilation with suction and sharp uterine curettage  Surgeon:  Lazaro ArmsEURE,Auston Halfmann H  Anesthesia:  Laryngeal mask airway  Findings:  The patient was known to have a first trimester pregnancy loss and received cytotec x 2 but still by sonogram had some retained tissue.    Description of operation:  The patient was taken to the operating room and placed in the supine position.  She underwent laryngeal mask airway general anesthesia.  The patient was placed in the dorsal lithotomy position.  The vagina was prepped and draped in the usual sterile fashion.  A Graves speculum was placed.  The anterior cervix was grasped with a single-tooth tenaculum.  The cervix was dilated serially with Hegar dilators.  A #8 curved suction curette was placed in the uterus.  The suction pressure was placed at 55 and several passes were made.  All of the intrauterine contents were removed.  The sharp curette was used x1 to feel uterine crie in all areas.  The patient was given Methergine 0.2 mg IM x1.  There was good hemostasis.  The patient was given Ancef IV preoperatively.  The patient was given Toradol 30 mg IV preoperatively.  Estimated blood loss for the procedure was 200 cc.  The patient was awakened from anesthesia taken to the recovery room in good stable condition.  All counts were correct x3.  Aahan Marques H 07/15/2014 11:37 AM

## 2014-07-15 NOTE — H&P (Signed)
Preoperative History and Physical  Deborah Evans is a 19 y.o. G1P0 with Patient's last menstrual period was 07/03/2014. admitted for a cervical dilation and suction and sharp curettage.  For incomplete ab  PMH:    Past Medical History  Diagnosis Date  . Medical history non-contributory     PSH:    No past surgical history on file.  POb/GynH:      OB History    Gravida Para Term Preterm AB TAB SAB Ectopic Multiple Living   1               SH:   History  Substance Use Topics  . Smoking status: Never Smoker   . Smokeless tobacco: Not on file  . Alcohol Use: No    FH:   No family history on file.   Allergies: No Known Allergies  Medications:      No current facility-administered medications for this encounter.  Review of Systems:   Review of Systems  Constitutional: Negative for fever, chills, weight loss, malaise/fatigue and diaphoresis.  HENT: Negative for hearing loss, ear pain, nosebleeds, congestion, sore throat, neck pain, tinnitus and ear discharge.   Eyes: Negative for blurred vision, double vision, photophobia, pain, discharge and redness.  Respiratory: Negative for cough, hemoptysis, sputum production, shortness of breath, wheezing and stridor.   Cardiovascular: Negative for chest pain, palpitations, orthopnea, claudication, leg swelling and PND.  Gastrointestinal: Positive for abdominal pain. Negative for heartburn, nausea, vomiting, diarrhea, constipation, blood in stool and melena.  Genitourinary: Negative for dysuria, urgency, frequency, hematuria and flank pain.  Musculoskeletal: Negative for myalgias, back pain, joint pain and falls.  Skin: Negative for itching and rash.  Neurological: Negative for dizziness, tingling, tremors, sensory change, speech change, focal weakness, seizures, loss of consciousness, weakness and headaches.  Endo/Heme/Allergies: Negative for environmental allergies and polydipsia. Does not bruise/bleed easily.   Psychiatric/Behavioral: Negative for depression, suicidal ideas, hallucinations, memory loss and substance abuse. The patient is not nervous/anxious and does not have insomnia.      PHYSICAL EXAM:  Last menstrual period 07/03/2014.    Vitals reviewed. Constitutional: She is oriented to person, place, and time. She appears well-developed and well-nourished.  HENT:  Head: Normocephalic and atraumatic.  Right Ear: External ear normal.  Left Ear: External ear normal.  Nose: Nose normal.  Mouth/Throat: Oropharynx is clear and moist.  Eyes: Conjunctivae and EOM are normal. Pupils are equal, round, and reactive to light. Right eye exhibits no discharge. Left eye exhibits no discharge. No scleral icterus.  Neck: Normal range of motion. Neck supple. No tracheal deviation present. No thyromegaly present.  Cardiovascular: Normal rate, regular rhythm, normal heart sounds and intact distal pulses.  Exam reveals no gallop and no friction rub.   No murmur heard. Respiratory: Effort normal and breath sounds normal. No respiratory distress. She has no wheezes. She has no rales. She exhibits no tenderness.  GI: Soft. Bowel sounds are normal. She exhibits no distension and no mass. There is tenderness. There is no rebound and no guarding.  Genitourinary:       Vulva is normal without lesions Vagina is pink moist without discharge Cervix normal in appearance and pap is normal Uterus is normal sized Adnexa is negative with normal sized ovaries by sonogram  Musculoskeletal: Normal range of motion. She exhibits no edema and no tenderness.  Neurological: She is alert and oriented to person, place, and time. She has normal reflexes. She displays normal reflexes. No cranial nerve deficit. She exhibits  normal muscle tone. Coordination normal.  Skin: Skin is warm and dry. No rash noted. No erythema. No pallor.  Psychiatric: She has a normal mood and affect. Her behavior is normal. Judgment and thought content  normal.    Labs: Results for orders placed or performed during the hospital encounter of 07/14/14 (from the past 336 hour(s))  CBC   Collection Time: 07/14/14  1:00 PM  Result Value Ref Range   WBC 7.5 4.0 - 10.5 K/uL   RBC 4.23 3.87 - 5.11 MIL/uL   Hemoglobin 10.9 (L) 12.0 - 15.0 g/dL   HCT 16.134.1 (L) 09.636.0 - 04.546.0 %   MCV 80.6 78.0 - 100.0 fL   MCH 25.8 (L) 26.0 - 34.0 pg   MCHC 32.0 30.0 - 36.0 g/dL   RDW 40.913.1 81.111.5 - 91.415.5 %   Platelets 270 150 - 400 K/uL  Comprehensive metabolic panel   Collection Time: 07/14/14  1:00 PM  Result Value Ref Range   Sodium 139 135 - 145 mmol/L   Potassium 4.3 3.5 - 5.1 mmol/L   Chloride 107 96 - 112 mmol/L   CO2 27 19 - 32 mmol/L   Glucose, Bld 101 (H) 70 - 99 mg/dL   BUN 14 6 - 23 mg/dL   Creatinine, Ser 7.820.63 0.50 - 1.10 mg/dL   Calcium 9.5 8.4 - 95.610.5 mg/dL   Total Protein 7.2 6.0 - 8.3 g/dL   Albumin 4.0 3.5 - 5.2 g/dL   AST 21 0 - 37 U/L   ALT 10 0 - 35 U/L   Alkaline Phosphatase 82 39 - 117 U/L   Total Bilirubin 0.6 0.3 - 1.2 mg/dL   GFR calc non Af Amer >90 >90 mL/min   GFR calc Af Amer >90 >90 mL/min   Anion gap 5 5 - 15  Urinalysis, Routine w reflex microscopic   Collection Time: 07/14/14  1:00 PM  Result Value Ref Range   Color, Urine YELLOW YELLOW   APPearance HAZY (A) CLEAR   Specific Gravity, Urine 1.020 1.005 - 1.030   pH 7.5 5.0 - 8.0   Glucose, UA NEGATIVE NEGATIVE mg/dL   Hgb urine dipstick LARGE (A) NEGATIVE   Bilirubin Urine NEGATIVE NEGATIVE   Ketones, ur NEGATIVE NEGATIVE mg/dL   Protein, ur TRACE (A) NEGATIVE mg/dL   Urobilinogen, UA 4.0 (H) 0.0 - 1.0 mg/dL   Nitrite NEGATIVE NEGATIVE   Leukocytes, UA MODERATE (A) NEGATIVE  Urine microscopic-add on   Collection Time: 07/14/14  1:00 PM  Result Value Ref Range   Squamous Epithelial / LPF MANY (A) RARE   WBC, UA 11-20 <3 WBC/hpf   RBC / HPF TOO NUMEROUS TO COUNT <3 RBC/hpf   Bacteria, UA MANY (A) RARE   Urine-Other MUCOUS PRESENT     EKG: No orders found for  this or any previous visit.  Imaging Studies: [place] Ob Comp Less 14 Wks  06/23/2014   DATING AND VIABILITY SONOGRAM   Deborah Evans is a 19 y.o. year old G1P0 with LMP 04/27/2014 which would  correlate to  8+[redacted] weeks gestation.  She has regular menstrual cycles.    She is here today for a confirmatory initial sonogram.    GESTATION: SINGLETON     FETAL ACTIVITY:          Heart rate         NO FCA NOTED           The fetus is inactive.  CERVIX: Appears closed   ADNEXA:  The ovaries are abnormal Rt ovary with cyst= 5.6 x 5.1x 4.7cm (simple  cyst)  GESTATIONAL AGE AND  BIOMETRICS:  Gestational criteria: Estimated Date of Delivery: 02/01/2015  by LMP now at  8+0 weeks  Previous Scans:0  GESTATIONAL SAC            mm          weeks  CROWN RUMP LENGTH           24 mm         9+1 weeks                                                                               AVERAGE EGA(BY THIS SCAN):   9+1 weeks  TECHNICIAN COMMENTS:  U/S-single IUP with NO FCA NOTED on today's exam, CRL c/w 9+1 wks, cx  appears closed, Rt ovary with 5.6 x 5.1x 4.7cm cyst noted, no free fluid  noted within the pelvis, pt was worked into [employer] schedule per  [employer]    A copy of this report including all images has been saved and backed up to  a second source for retrieval if needed. All measures and details of the  anatomical scan, placentation, fluid volume and pelvic anatomy are  contained in that report.  Chari Manning 06/22/2014 2:48 PM Clinical Impression and recommendations:  I have reviewed the sonogram results above.  Combined with the patient's current clinical course, below are my  impressions and any appropriate recommendations for management based on  the sonographic findings:  1. Nonviable pregnancy at 9w 1 d, based on CRL , sufficiently far in to  pregnancy that inevitability of pregnancy loss is certain. 2. Case reviewed with Dr Despina Hidden and Pt deciding to proceed at this time to  use cytotec for expediting delivery.  FERGUSON,JOHN V    US Transvaginal Non-ob  07/07/2014   GYNECOLOGIC SONOGRAM   ERI MCEVERS is a 19 y.o. G1P0  for a pelvic sonogram for f/u MAB  ?retained POC.  Uterus                      Anteverted   Endometrium          With area of ?tissue noted within the lower uterine  segment  Right ovary             5.3 x 4.7 x 3.5 cm, with 4.3 x 4.2 x 3.1cm simple  cyst noted   Left ovary                2.8 x 2.0 x 1.9 cm,   No free fluid noted within the pelvis  Technician Comments:  Anteverted uterus, Endometrium with Tissue noted within the lower uterine  segment noted, rt ovary with simple cyst noted= 4.3 x 4.2 x 3.1cm, lt  ovary appears WNL   Chari Manning 07/07/2014 12:14 PM  Clinical Impression and recommendations:  I have reviewed the sonogram results above, combined with the patient's  current clinical course, below are my impressions and any appropriate  recommendations for management based on the sonographic findings.  Incomplete missed abortion after treatment with cytotec x 1   EURE,LUTHER H 07/07/2014  12:21 PM        Assessment: Patient Active Problem List   Diagnosis Date Noted  . Incomplete abortion with infection 07/07/2014  . Endometritis 07/07/2014  . Missed abortion 06/22/2014    Plan: Cervical dilation and suction and sharp curettage  EURE,LUTHER H 07/15/2014 9:06 AM

## 2014-07-15 NOTE — Anesthesia Postprocedure Evaluation (Signed)
  Anesthesia Post-op Note  Patient: Deborah Evans  Procedure(s) Performed: Procedure(s): SUCTION DILATATION AND CURETTAGE (N/A)  Patient Location: PACU  Anesthesia Type:General  Level of Consciousness: awake and alert   Airway and Oxygen Therapy: Patient Spontanous Breathing  Post-op Pain: mild  Post-op Assessment: Post-op Vital signs reviewed, Patient's Cardiovascular Status Stable, PATIENT'S CARDIOVASCULAR STATUS UNSTABLE and Patent Airway  Post-op Vital Signs: Reviewed and stable  Last Vitals:  Filed Vitals:   07/15/14 1218  BP: 118/73  Pulse: 85  Temp:   Resp:     Complications: No apparent anesthesia complications. Late entry T. Tajae Maiolo CRNA 07/15/2014 1331

## 2014-07-17 ENCOUNTER — Encounter (HOSPITAL_COMMUNITY): Payer: Self-pay | Admitting: Obstetrics & Gynecology

## 2014-07-22 ENCOUNTER — Encounter: Payer: Self-pay | Admitting: Obstetrics & Gynecology

## 2014-07-23 ENCOUNTER — Ambulatory Visit (INDEPENDENT_AMBULATORY_CARE_PROVIDER_SITE_OTHER): Payer: BLUE CROSS/BLUE SHIELD | Admitting: Obstetrics & Gynecology

## 2014-07-23 ENCOUNTER — Encounter: Payer: Self-pay | Admitting: Obstetrics & Gynecology

## 2014-07-23 VITALS — BP 120/70 | HR 84 | Wt 148.0 lb

## 2014-07-23 DIAGNOSIS — O0339 Incomplete spontaneous abortion with other complications: Secondary | ICD-10-CM

## 2014-07-23 DIAGNOSIS — Z9889 Other specified postprocedural states: Secondary | ICD-10-CM

## 2014-07-23 NOTE — Progress Notes (Signed)
Patient ID: Deborah Evans, female   DOB: [birth year], 19 y.o.   MRN: [id number]  HPI: Patient returns for routine postoperative follow-up having undergone D&C on 07/15/2014 due to an incomplete miscarriage status post 2 rounds of cytotec. The patient's early postoperative recovery while in the hospital was notable for na. Since hospital discharge the patient reports only spotting now.   Current Outpatient Prescriptions  Medication Sig Dispense Refill  . ciprofloxacin (CIPRO) 500 MG tablet Take 1 tablet (500 mg total) by mouth 2 (two) times daily. 614 tablet 0  . HYDROcodone-acetaminophen (NORCO/VICODIN) 5-325 MG per tablet Take 1 tablet by mouth every 6 (six) hours as needed. (Patient not taking: Reported on 07/23/2014) 10 tablet 0  . ketorolac (TORADOL) 10 MG tablet Take 1 tablet (10 mg total) by mouth every 8 (eight) hours as needed. (Patient not taking: Reported on 07/23/2014) 15 tablet 0  . methylergonovine (METHERGINE) 0.2 MG tablet Take 1 tablet (0.2 mg total) by mouth every 6 (six) hours. (Patient not taking: Reported on 07/23/2014) 8 tablet 0   No current facility-administered medications for this visit.    Physical Exam: NEFG Vagina moist no discharge or bleeding Cervix nulliparous in appearance no tissue seen no bleeding no CMT Uterus NSSC NT Adnexa negative  Diagnostic Tests: none  Impression: S/P D&C for incomplete pregnancy loss S/P 2 rounds of cytotec  No evidence of endometritis at this point  Plan: No BCM for 2 more weeks, declines BCM   Lazaro ArmsEURE,Tevon Berhane H, MD

## 2014-09-22 ENCOUNTER — Encounter (HOSPITAL_COMMUNITY): Payer: Self-pay | Admitting: *Deleted

## 2014-09-22 DIAGNOSIS — Y998 Other external cause status: Secondary | ICD-10-CM | POA: Diagnosis not present

## 2014-09-22 DIAGNOSIS — Y92410 Unspecified street and highway as the place of occurrence of the external cause: Secondary | ICD-10-CM | POA: Diagnosis not present

## 2014-09-22 DIAGNOSIS — S4991XA Unspecified injury of right shoulder and upper arm, initial encounter: Secondary | ICD-10-CM | POA: Diagnosis present

## 2014-09-22 DIAGNOSIS — Z792 Long term (current) use of antibiotics: Secondary | ICD-10-CM | POA: Insufficient documentation

## 2014-09-22 DIAGNOSIS — Y9389 Activity, other specified: Secondary | ICD-10-CM | POA: Diagnosis not present
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
## 2014-09-23 ENCOUNTER — Emergency Department (HOSPITAL_COMMUNITY)
Admission: EM | Admit: 2014-09-23 | Discharge: 2014-09-23 | Disposition: A | Discharge status: Home / Self Care | Payer: BLUE CROSS/BLUE SHIELD | Attending: Emergency Medicine | Admitting: Emergency Medicine

## 2014-09-23 DIAGNOSIS — M25511 Pain in right shoulder: Secondary | ICD-10-CM

## 2014-09-23 MED ORDER — TRAMADOL HCL 50 MG PO TABS: 50.0000 mg | ORAL_TABLET | Freq: Four times a day (QID) | ORAL | Status: DC | PRN

## 2014-09-23 MED ORDER — TRAMADOL HCL 50 MG PO TABS
50.0000 mg | ORAL_TABLET | Freq: Once | ORAL | Status: AC
Start: 2014-09-23 — End: 2014-09-23
  Administered 2014-09-23: 50 mg via ORAL
  Filled 2014-09-23: qty 1

## 2014-09-23 MED ORDER — NAPROXEN 500 MG PO TABS
500.0000 mg | ORAL_TABLET | Freq: Once | ORAL | Status: AC
  Administered 2014-09-23: 500 mg via ORAL
  Filled 2014-09-23: qty 1

## 2014-09-23 MED ORDER — NAPROXEN 500 MG PO TABS: 500.0000 mg | ORAL_TABLET | Freq: Two times a day (BID) | ORAL | Status: DC
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
## 2014-09-23 NOTE — Discharge Instructions (Signed)
Please follow the directions guided. Use resource guide to establish care with a primary care doctor to make sure you're getting better. Please use the naproxen twice a day to help with pain and inflammation. You may use the tramadol for pain not relieved by the naproxen. He may use ice or heat to help with discomfort. Don't hesitate to return for any new, worsening, or concerning symptoms.    SEEK IMMEDIATE MEDICAL CARE IF:  You have numbness, tingling, or weakness in the arms or legs.  You develop severe headaches not relieved with medicine.  You have severe neck pain, especially tenderness in the middle of the back of your neck.  You have changes in bowel or bladder control.  There is increasing pain in any area of the body.  You have shortness of breath, light-headedness, dizziness, or fainting.  You have chest pain.  You feel sick to your stomach (nauseous), throw up (vomit), or sweat.  You have increasing abdominal discomfort.  There is blood in your urine, stool, or vomit.  You have pain in your shoulder (shoulder strap areas).  You feel your symptoms are getting worse.   Emergency Department Resource Guide 1) Find a Doctor and Pay Out of Pocket Although you won't have to find out who is covered by your insurance plan, it is a good idea to ask around and get recommendations. You will then need to call the office and see if the doctor you have chosen will accept you as a new patient and what types of options they offer for patients who are self-pay. Some doctors offer discounts or will set up payment plans for their patients who do not have insurance, but you will need to ask so you aren't surprised when you get to your appointment.  2) Contact Your Local Health Department Not all health departments have doctors that can see patients for sick visits, but many do, so it is worth a call to see if yours does. If you don't know where your local health department is, you can check in your  phone book. The CDC also has a tool to help you locate your state's health department, and many state websites also have listings of all of their local health departments.  3) Find a Walk-in Clinic If your illness is not likely to be very severe or complicated, you may want to try a walk in clinic. These are popping up all over the country in pharmacies, drugstores, and shopping centers. They're usually staffed by nurse practitioners or physician assistants that have been trained to treat common illnesses and complaints. They're usually fairly quick and inexpensive. However, if you have serious medical issues or chronic medical problems, these are probably not your best option.  No Primary Care Doctor: - Call Health Connect at  [phone] - they can help you locate a primary care doctor that  accepts your insurance, provides certain services, etc. - Physician Referral Service- [phone]  Chronic Pain Problems: Organization         Address  Phone   Notes  [facility]  [phone] Patients need to be referred by their primary care doctor.   Medication Assistance: Organization         Address  Phone   Notes  [facility] Medication [facility] [address] [place], [place] [address] [phone] --Must be a resident of [facility] -- Must have NO insurance coverage whatsoever (no Medicaid/ Medicare, etc.) -- The pt.  MUST have a primary care doctor that directs their care regularly and follows them in the community   Med[facility]ist  [phone]   [employer]  [phone]    Agencies that provide inexpensive medical care: Organization         Address  Phone   Notes  [facility]  [phone]) [phone]   [facility]    [phone]) [phone]   [facility] [address] [place], [place] [address] [phone]   [facility] [address], [place] [phone]   Planned  Parenthood    [phone]   [facility]    [phone]   [facility] Health and [facility]  [address], [place] Phone:  [phone], Fax:  [phone] Hours of Operation:  9 am - 6 pm, M-F.  Also accepts Medicaid/Medicare and self-pay.  [facility]  [address], [place] Phone: [phone], Fax: [phone]) [phone]. Hours of Operation:  8:30 am - 5:30 pm, M-F.  Also accepts Medicaid and self-pay.  [facility] [place] [address], [place] Phone: [phone](336) [phone]   [facility] [address], [place], [place] [phone], Ext. [phone] Mondays & Thursdays: 7-9 AM.  First 15 patients are seen on a first come, first serve basis.    Medicaid-accepting [facility] Providers:  Organization         Address  Phone   Notes  [facility] [address], [place] [phone] Also accepts self-pay patients.  [facility] [address] [place], [place]  [phone]) [phone]   [facility] [address], [place] [phone]) [phone]   [facility] [address] [place] [address], [place] [phone]) [phone]   Renaye RakersVeita Bland [address], [place]   [phone] Only accepts [place] Access [place] patients after they have their name applied to their card.   Self-Pay (no insurance) in [facility]:  Organization         Address  Phone   Notes  Sickle Cell Patients, [facility] [address], [place] [phone]) [phone]   [facility] [address], [place] [phone]   [facility] [place]  [address], [place] [phone]   [facility]/Dr. Osei-Bonsu  [address] [place] [address], [place] or [phone], [place] [phone] Phone number for both [place] and [place] locations is the same.    [facility] [address], [place] [phone]   [facility]  [address] [place] [address], [place] or [address] [phone]) [phone]   [facility] [address], [place] [phone]) [phone], phone; [phone], fax Sees patients 1st and 3rd Saturday of every month.  Must not qualify for public or private insurance (i.e. Medicaid, Medicare, [place] Health Choice, Veterans' Benefits)  Household income should be no more than 200% of the poverty level The clinic cannot treat you if you are pregnant or think you are pregnant  Sexually transmitted diseases are not treated at the clinic.    Dental Care: Organization         Address  Phone  Notes  [facility] [address], [place] [phone]) [phone] Accepts children up to age 20 who are enrolled in Medicaid or [place] Health Choice; pregnant women with a Medicaid card; and children who have applied for Medicaid or [place] Health Choice, but were declined, whose parents can pay a reduced fee at time of service.  [facility] [place]  [address], [place] [phone]) [phone] Accepts children up to age 20 who are enrolled in [place] or [place] Health Choice; pregnant women with a Medicaid card; and children who have applied for Medicaid or [place] Health Choice, but were declined, whose parents can pay a reduced fee at time of service.  [facility]  [address], [place] [phone]) [phone] Patients are seen by appointment only. Walk-ins are not accepted. [facility] will see patients 20 years of age and older. Monday - Tuesday (8am-5pm) Most Wednesdays (8:30-5pm) $30 per visit, cash only  [facility]  [address], [facility] [phone]) [phone] Patients are seen by appointment only. Walk-ins are not accepted. [facility] will see patients 1918  years of age and older. One Wednesday Evening (Monthly: Volunteer Based).  $30 per visit, cash only  [employer] [facility] [facility]  [phone] for adults; Children under age 934, call Graduate Pediatric Dentistry at [phone]. Children aged 254-14, please call [phone] to request a pediatric application.  Dental services are provided in all areas of dental care including fillings, crowns and bridges, complete and partial dentures, implants, gum treatment, root canals, and extractions. Preventive care is also provided. Treatment is provided to both adults and children. Patients are selected via a lottery and there is often a waiting list.   [facility] [address], [place]  [phone]) [phone] [email or web address]   [facility] [address], [place], [place] [phone], Ext. [phone] Second and Fourth Thursday of each month, opens at 6:30 AM; Clinic ends at 9 AM.  Patients are seen on a first-come first-served basis, and a limited number are seen during each clinic.   [facility]  [address], [place], [place] [phone]) [phone]   Eligibility Requirements You must have lived in [place], [place], or [place] counties for at least the last three months.   You cannot be eligible for state or federal sponsored National Cityhealthcare insurance, including CIGNAVeterans Administration, [place]icaid, or [employer]Medicare.   You generally cannot be eligible for healthcare insurance through your employer.    How to apply: Eligibility screenings are held every Tuesday and Wednesday afternoon from 1:00 pm until 4:00 pm. You do not need an appointment for the interview!  [facility] [address], [place], [place] [phone]   [facility]  [phone]   [facility]  [phone]   [facility]  [phone]    Behavioral Health Resources in the Community: Intensive Outpatient  Programs Organization         Address  Phone  Notes  [facility] [address], [place], [place] [phone]   [facility] [address], [place], [place] [phone]   [facility] [address], [place], [place]  [phone]   [facility] [address],  [place], [place] [phone] or [phone]   Substance Abuse Resources Organization         Address  Phone  Notes  Alcohol and  Drug Services  [place]  [phone]   The [employer]   Chinita Pester  [phone]   Residential & Outpatient Substance Abuse Program  [phone]   Psychological Services Organization         Address  Phone  Notes  [facility]  [place]  [phone]   [place] [address] [place] or [phone]    Mobile Crisis Teams Organization         Address  Phone  Notes  [facility], Mobile Crisis Care Unit  [phone]   [facility]  [address] [place], [place]   [place] Levels [address] [place] [phone]    Self-Help/Support Groups Organization         Address  Phone             Notes  [place]. of [place] - variety of support groups  [place] Call for more information  Narcotics Anonymous (NA), [facility]aring [facility] [address], [employer] [place]  2 meetings at this location   Special educational needs teacher         Address  Phone  Notes  ASAP Residential Treatment [place],    [place]  [phone]   [facility]  [address], [place] [address]8099, [place], [place]   [place], [place] [phone] Admissions: 8am-3pm M-F  Incentives Substance [place] [address]    [place], [place]  [phone]   [facility] [address] [place], [place], [place]   [facility] [address],  [place], [place]   Insight Programs - Intensive Outpatient [address], [place], [place]   [facility] ([address]rey.) [address].,  [place], [place] [phone] or [phone]   [facility] Treatment Services (RTS) [address], [place], [place] Accepts Medicaid  [facility] [place] [address]  [place] [phone] Substance Abuse/Addiction Treatment   [facility] Organization         Address  Phone  Notes  [facility]  [phone]   Domenic Schwab, PhD [address]lis Port[address] [place], [place]   [phone] or [phone]   [place], [place] [phone]   [facility] [address] [place], [place] [phone] Insurance/Medicaid/sponsorship through [facility] and Families [address] [place]                                    [place], [place] [phone] [place] [address], [place] [phone]    Dr. Adele Schilder  [phone]   [facility] Dept. 1) [address] [place] 2) [place] 3)  [address], [place] [phone]  [phone]   [place] [phone] or [phone] (After Hours)

## 2014-12-03 ENCOUNTER — Emergency Department (HOSPITAL_COMMUNITY)
Admission: EM | Admit: 2014-12-03 | Discharge: 2014-12-03 | Disposition: A | Discharge status: Home / Self Care | Payer: BLUE CROSS/BLUE SHIELD | Attending: Emergency Medicine | Admitting: Emergency Medicine

## 2014-12-03 ENCOUNTER — Encounter (HOSPITAL_COMMUNITY): Payer: Self-pay

## 2014-12-03 DIAGNOSIS — Z791 Long term (current) use of non-steroidal anti-inflammatories (NSAID): Secondary | ICD-10-CM | POA: Diagnosis not present

## 2014-12-03 DIAGNOSIS — Z3202 Encounter for pregnancy test, result negative: Secondary | ICD-10-CM | POA: Diagnosis not present

## 2014-12-03 DIAGNOSIS — S39012A Strain of muscle, fascia and tendon of lower back, initial encounter: Secondary | ICD-10-CM

## 2014-12-03 DIAGNOSIS — Y92410 Unspecified street and highway as the place of occurrence of the external cause: Secondary | ICD-10-CM | POA: Insufficient documentation

## 2014-12-03 DIAGNOSIS — Z792 Long term (current) use of antibiotics: Secondary | ICD-10-CM | POA: Insufficient documentation

## 2014-12-03 DIAGNOSIS — N390 Urinary tract infection, site not specified: Secondary | ICD-10-CM | POA: Diagnosis not present

## 2014-12-03 DIAGNOSIS — Y9389 Activity, other specified: Secondary | ICD-10-CM | POA: Insufficient documentation

## 2014-12-03 DIAGNOSIS — S3992XA Unspecified injury of lower back, initial encounter: Secondary | ICD-10-CM | POA: Diagnosis present

## 2014-12-03 DIAGNOSIS — Y998 Other external cause status: Secondary | ICD-10-CM | POA: Insufficient documentation

## 2014-12-03 LAB — URINALYSIS, ROUTINE W REFLEX MICROSCOPIC
Bilirubin Urine: NEGATIVE
GLUCOSE, UA: NEGATIVE mg/dL
HGB URINE DIPSTICK: NEGATIVE
Ketones, ur: 15 mg/dL — AB
Leukocytes, UA: NEGATIVE
Nitrite: POSITIVE — AB
PROTEIN: NEGATIVE mg/dL
Specific Gravity, Urine: 1.027 (ref 1.005–1.030)
UROBILINOGEN UA: 1 mg/dL (ref 0.0–1.0)
pH: 7.5 (ref 5.0–8.0)

## 2014-12-03 LAB — PREGNANCY, URINE: Preg Test, Ur: NEGATIVE

## 2014-12-03 LAB — URINE MICROSCOPIC-ADD ON

## 2014-12-03 MED ORDER — CEPHALEXIN 500 MG PO CAPS: 500.0000 mg | ORAL_CAPSULE | Freq: Two times a day (BID) | ORAL | Status: DC

## 2014-12-03 MED ORDER — CYCLOBENZAPRINE HCL 10 MG PO TABS: 10.0000 mg | ORAL_TABLET | Freq: Two times a day (BID) | ORAL | Status: DC | PRN
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
## 2016-10-28 ENCOUNTER — Emergency Department (HOSPITAL_COMMUNITY): Payer: BLUE CROSS/BLUE SHIELD

## 2016-10-28 ENCOUNTER — Emergency Department (HOSPITAL_COMMUNITY)
Admission: EM | Admit: 2016-10-28 | Discharge: 2016-10-28 | Disposition: A | Discharge status: Home / Self Care | Payer: BLUE CROSS/BLUE SHIELD | Attending: Emergency Medicine | Admitting: Emergency Medicine

## 2016-10-28 ENCOUNTER — Encounter (HOSPITAL_COMMUNITY): Payer: Self-pay

## 2016-10-28 DIAGNOSIS — M545 Low back pain: Secondary | ICD-10-CM | POA: Diagnosis present

## 2016-10-28 DIAGNOSIS — M5432 Sciatica, left side: Secondary | ICD-10-CM | POA: Diagnosis not present

## 2016-10-28 LAB — POC URINE PREG, ED: PREG TEST UR: NEGATIVE

## 2016-10-28 MED ORDER — METHOCARBAMOL 500 MG PO TABS
1000.0000 mg | ORAL_TABLET | Freq: Once | ORAL | Status: AC
  Administered 2016-10-28: 1000 mg via ORAL
  Filled 2016-10-28: qty 2

## 2016-10-28 MED ORDER — KETOROLAC TROMETHAMINE 60 MG/2ML IM SOLN
60.0000 mg | Freq: Once | INTRAMUSCULAR | Status: AC
  Administered 2016-10-28: 60 mg via INTRAMUSCULAR
  Filled 2016-10-28: qty 2

## 2016-10-28 MED ORDER — HYDROCODONE-ACETAMINOPHEN 5-325 MG PO TABS
ORAL_TABLET | ORAL | Status: AC
  Filled 2016-10-28: qty 1

## 2016-10-28 MED ORDER — TRAMADOL HCL 50 MG PO TABS: 50.0000 mg | ORAL_TABLET | Freq: Four times a day (QID) | ORAL | 0 refills | Status: DC | PRN

## 2016-10-28 MED ORDER — CYCLOBENZAPRINE HCL 5 MG PO TABS: 5.0000 mg | ORAL_TABLET | Freq: Three times a day (TID) | ORAL | 0 refills | Status: DC | PRN

## 2016-10-28 MED ORDER — PREDNISONE 10 MG PO TABS: ORAL_TABLET | ORAL | 0 refills | Status: DC

## 2016-10-28 MED ORDER — HYDROCODONE-ACETAMINOPHEN 5-325 MG PO TABS
2.0000 | ORAL_TABLET | Freq: Once | ORAL | Status: AC
  Administered 2016-10-28: 2 via ORAL
  Filled 2016-10-28: qty 2
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
## 2016-10-28 NOTE — Discharge Instructions (Signed)
Take your entire course of prednisone.  Use the the other medicines as directed.  Do not drive within 4 hours of taking tramadol as this will make you drowsy.  Avoid lifting,  Bending,  Twisting or any other activity that worsens your pain over the next week.  Apply a heating pad to your lower back for 20 minutes several times daily.  You should get rechecked if your symptoms are not better over the next 5 days,  Or you develop increased pain,  Weakness in your leg(s) or loss of bladder or bowel function - these are symptoms of a worse injury.

## 2016-10-28 NOTE — ED Triage Notes (Signed)
Pt was brought in by EMS due to lower back pain. Pt reports that back has been hurting for 4 days. States she cannot walk due to pain. States she has numbness to legs

## 2017-05-01 NOTE — L&D Delivery Note (Signed)
Patient: Lebron Quamyana S Downie MRN: [id number]  GBS status: negative, IAP given: not indicated  Patient is a 22 y.o. now G2P1 s/p NSVD at 2559w2d, who was admitted for IOL for PROM. PROM 10h 4554m prior to delivery with clear fluid.    Delivery Note At 2:18 AM a viable female was delivered via Vaginal, Spontaneous (Presentation: cephalic; OA).  APGAR: 9, 10; weight: pending (appears AGA).   Placenta status: intact, 3-vessel cord.  Cord:  with the following complications: nuchal x 1, delivered through.   Shoulder and body delivered in usual fashion. Infant with spontaneous cry, placed on mother's abdomen, dried and bulb suctioned. Cord clamped x 2 after 1-minute delay, and cut by family member. Cord blood drawn. Placenta delivered spontaneously with gentle cord traction. Fundus firm with massage and Pitocin. Perineum inspected and found to have 2nd degree perineal and right periuretheral lacerations, which were repaired with 3.0 vicryl with good hemostasis achieved.  Anesthesia:  Epidural + lidocaine for repair Episiotomy: None Lacerations: 2nd degree;Periurethral Suture Repair: 3.0 vicryl Est. Blood Loss (mL):  200  Mom to postpartum.  Baby to Couplet care / Skin to Skin.  Gwenevere AbbotNimeka Arn Mcomber 04/25/2018, 2:58 AM

## 2017-10-13 ENCOUNTER — Emergency Department (HOSPITAL_COMMUNITY)
Admission: EM | Admit: 2017-10-13 | Discharge: 2017-10-14 | Disposition: A | Discharge status: Home / Self Care | Payer: Medicaid Other | Attending: Emergency Medicine | Admitting: Emergency Medicine

## 2017-10-13 ENCOUNTER — Other Ambulatory Visit: Payer: Self-pay

## 2017-10-13 ENCOUNTER — Encounter (HOSPITAL_COMMUNITY): Payer: Self-pay | Admitting: Emergency Medicine

## 2017-10-13 DIAGNOSIS — O9989 Other specified diseases and conditions complicating pregnancy, childbirth and the puerperium: Secondary | ICD-10-CM | POA: Diagnosis present

## 2017-10-13 DIAGNOSIS — R8271 Bacteriuria: Secondary | ICD-10-CM | POA: Diagnosis not present

## 2017-10-13 DIAGNOSIS — O210 Mild hyperemesis gravidarum: Secondary | ICD-10-CM | POA: Diagnosis not present

## 2017-10-13 DIAGNOSIS — Z3A11 11 weeks gestation of pregnancy: Secondary | ICD-10-CM | POA: Insufficient documentation

## 2017-10-13 DIAGNOSIS — Z79899 Other long term (current) drug therapy: Secondary | ICD-10-CM | POA: Insufficient documentation

## 2017-10-13 LAB — URINALYSIS, ROUTINE W REFLEX MICROSCOPIC
Bilirubin Urine: NEGATIVE
GLUCOSE, UA: NEGATIVE mg/dL
Hgb urine dipstick: NEGATIVE
Ketones, ur: 80 mg/dL — AB
Nitrite: NEGATIVE
Protein, ur: 100 mg/dL — AB
SPECIFIC GRAVITY, URINE: 1.029 (ref 1.005–1.030)
Squamous Epithelial / LPF: >50 — ABNORMAL HIGH (ref 0–5)
pH: 6 (ref 5.0–8.0)

## 2017-10-13 LAB — CBC WITH DIFFERENTIAL/PLATELET
BASOS ABS: 0 10*3/uL (ref 0.0–0.1)
Basophils Relative: 0 %
EOS PCT: 0 %
Eosinophils Absolute: 0 10*3/uL (ref 0.0–0.7)
HCT: 36.5 % (ref 36.0–46.0)
Hemoglobin: 12 g/dL (ref 12.0–15.0)
Lymphocytes Relative: 26 %
Lymphs Abs: 1.8 10*3/uL (ref 0.7–4.0)
MCH: 26.3 pg (ref 26.0–34.0)
MCHC: 32.9 g/dL (ref 30.0–36.0)
MCV: 80 fL (ref 78.0–100.0)
Monocytes Absolute: 0.6 10*3/uL (ref 0.1–1.0)
Monocytes Relative: 8 %
Neutro Abs: 4.3 10*3/uL (ref 1.7–7.7)
Neutrophils Relative %: 66 %
PLATELETS: 200 10*3/uL (ref 150–400)
RBC: 4.56 MIL/uL (ref 3.87–5.11)
RDW: 12.8 % (ref 11.5–15.5)
WBC: 6.7 10*3/uL (ref 4.0–10.5)

## 2017-10-13 LAB — COMPREHENSIVE METABOLIC PANEL
ALT: 49 U/L (ref 14–54)
AST: 27 U/L (ref 15–41)
Albumin: 4.1 g/dL (ref 3.5–5.0)
Alkaline Phosphatase: 51 U/L (ref 38–126)
Anion gap: 8 (ref 5–15)
BUN: 12 mg/dL (ref 6–20)
CHLORIDE: 105 mmol/L (ref 101–111)
CO2: 23 mmol/L (ref 22–32)
CREATININE: 0.53 mg/dL (ref 0.44–1.00)
Calcium: 9.3 mg/dL (ref 8.9–10.3)
GFR calc non Af Amer: >60 mL/min (ref >60)
Glucose, Bld: 100 mg/dL — ABNORMAL HIGH (ref 65–99)
Potassium: 3.5 mmol/L (ref 3.5–5.1)
Sodium: 136 mmol/L (ref 135–145)
Total Bilirubin: 1 mg/dL (ref 0.3–1.2)
Total Protein: 7.4 g/dL (ref 6.5–8.1)

## 2017-10-13 MED ORDER — SODIUM CHLORIDE 0.9 % IV SOLN
1.0000 g | Freq: Once | INTRAVENOUS | Status: AC
  Administered 2017-10-13: 1 g via INTRAVENOUS
  Filled 2017-10-13: qty 10

## 2017-10-13 MED ORDER — SODIUM CHLORIDE 0.9 % IV BOLUS
1000.0000 mL | Freq: Once | INTRAVENOUS | Status: AC
  Administered 2017-10-13: 1000 mL via INTRAVENOUS

## 2017-10-13 MED ORDER — ONDANSETRON HCL 4 MG/2ML IJ SOLN
4.0000 mg | Freq: Once | INTRAMUSCULAR | Status: AC
  Administered 2017-10-13: 4 mg via INTRAVENOUS
  Filled 2017-10-13: qty 2

## 2017-10-13 NOTE — ED Triage Notes (Signed)
Pt reports she is 8032w6d pregnant and is having emesis and unable to hold anything down since becoming pregnant, pt is a new pt with [facility], first appt is 10/23/17

## 2017-10-13 NOTE — ED Provider Notes (Signed)
[facility] EMERGENCY DEPARTMENT Provider Note   CSN: [id number] Arrival date & time: 10/13/17  2104     History   Chief Complaint Chief Complaint  Patient presents with  . Emesis    HPI Deborah Evans is a 22 y.o. female.  HPI   22 year old female who is pregnant here with complaints of nausea and vomiting.  Patient states she is about [redacted] weeks pregnant based on last menstruation according to the health department.  Since being diagnosed with pregnancy, she has endorsed nausea and vomiting.  Symptom has become progressively worse.  Initially she was able to drink fluid and now anything that she eats or drinks makes her very nauseous and occasional vomiting.  She is a G2 P1 with a miscarriage at 5 weeks.  She currently denies having fever, chills, headache, lightheadedness, dizziness, chest pain, shortness of breath, productive cough, abdominal pain, back pain, dysuria, hematuria, vaginal bleeding or vaginal discharge.  She denies any specific treatment tried at home.  She is scheduled to be seen by her OB/GYN on 10/23/2017.  She has not have a formal ultrasound.  Past Medical History:  Diagnosis Date  . Medical history non-contributory     Patient Active Problem List   Diagnosis Date Noted  . Incomplete abortion with infection 07/07/2014  . Endometritis 07/07/2014  . Missed abortion 06/22/2014    Past Surgical History:  Procedure Laterality Date  . DILATION AND CURETTAGE OF UTERUS N/A 07/15/2014   Procedure: SUCTION DILATATION AND CURETTAGE;  Surgeon: Lazaro Arms, MD;  Location: [facility];  Service: Gynecology;  Laterality: N/A;     OB History    Gravida  2   Para      Term      Preterm      AB      Living        SAB      TAB      Ectopic      Multiple      Live Births               Home Medications    Prior to Admission medications   Medication Sig Start Date End Date Taking? Authorizing Provider  Prenatal Vit-Fe Fumarate-FA  (MULTIVITAMIN-PRENATAL) 27-0.8 MG TABS tablet Take 1 tablet by mouth daily at 12 noon.   Yes [provider]    Family History History reviewed. No pertinent family history.  Social History Social History   Tobacco Use  . Smoking status: Never Smoker  . Smokeless tobacco: Never Used  Substance Use Topics  . Alcohol use: No  . Drug use: No     Allergies   Patient has no known allergies.   Review of Systems Review of Systems  All other systems reviewed and are negative.    Physical Exam Updated Vital Signs BP (!) 122/91 (BP Location: Right Arm)   Pulse 97   Temp 98.2 F (36.8 C) (Oral)   Resp 17   Ht 5\' 7"  (1.702 m)   Wt 72.1 kg (159 lb)   SpO2 100%   BMI 24.90 kg/m   Physical Exam  Constitutional: She appears well-developed and well-nourished. No distress.  HENT:  Head: Atraumatic.  Mouth/Throat: Oropharynx is clear and moist.  Eyes: Conjunctivae are normal.  Neck: Normal range of motion. Neck supple.  Cardiovascular: Normal rate and regular rhythm.  Pulmonary/Chest: Effort normal and breath sounds normal.  Abdominal: Soft. Bowel sounds are normal. She exhibits no distension. There is no  tenderness.  Neurological: She is alert.  Skin: No rash noted.  Psychiatric: She has a normal mood and affect.  Nursing note and vitals reviewed.    ED Treatments / Results  Labs (all labs ordered are listed, but only abnormal results are displayed) Labs Reviewed  COMPREHENSIVE METABOLIC PANEL - Abnormal; Notable for the following components:      Result Value   Glucose, Bld 100 (*)    All other components within normal limits  URINALYSIS, ROUTINE W REFLEX MICROSCOPIC - Abnormal; Notable for the following components:   Color, Urine AMBER (*)    APPearance CLOUDY (*)    Ketones, ur 80 (*)    Protein, ur 100 (*)    Leukocytes, UA TRACE (*)    Bacteria, UA FEW (*)    Squamous Epithelial / LPF >50 (*)    All other components within normal limits  HCG,  QUANTITATIVE, PREGNANCY - Abnormal; Notable for the following components:   hCG, Beta Chain, Quant, S [id number] (*)    All other components within normal limits  CBC WITH DIFFERENTIAL/PLATELET    EKG None  Radiology No results found.  Procedures Procedures (including critical care time)  Medications Ordered in ED Medications  ondansetron (ZOFRAN) injection 4 mg (4 mg Intravenous Given 10/13/17 2318)  sodium chloride 0.9 % bolus 1,000 mL (0 mLs Intravenous Stopped 10/14/17 0017)  cefTRIAXone (ROCEPHIN) 1 g in sodium chloride 0.9 % 100 mL IVPB (0 g Intravenous Stopped 10/14/17 0017)     Initial Impression / Assessment and Plan / ED Course  I have reviewed the triage vital signs and the nursing notes.  Pertinent labs & imaging results that were available during my care of the patient were reviewed by me and considered in my medical decision making (see chart for details).     BP (!) 122/91 (BP Location: Right Arm)   Pulse 97   Temp 98.2 F (36.8 C) (Oral)   Resp 17   Ht 5\' 7"  (1.702 m)   Wt 72.1 kg (159 lb)   SpO2 100%   BMI 24.90 kg/m    Final Clinical Impressions(s) / ED Diagnoses   Final diagnoses:  Hyperemesis gravidarum  Asymptomatic bacteriuria during pregnancy    ED Discharge Orders        Ordered    cephALEXin (KEFLEX) 500 MG capsule  3 times daily     10/14/17 0024    Doxylamine-Pyridoxine 10-10 MG TBEC  Every 8 hours PRN     10/14/17 0024     11:16 PM Patient who is [redacted] weeks pregnant here with symptoms suggestive of hyperemesis gravidarum.  She states the symptom has been ongoing for the duration of her pregnancy.  She however did not appears to be dehydrated.  She has no abdominal pain, vaginal bleeding or vaginal discharge.  Her vital signs stable and she is well-appearing.  Urinalysis shows 80 ketones, as well as evidence suggestive of urinary tract infection.  It is not a clean urine as they are greater than 50 squamous epithelial and she denies any  urinary symptoms.  However, due to her pregnancy status, she will be treated for asymptomatic bacteriuria with Rocephin.  Patient will benefit from Diglegis antinausea medication.  However, due to the enormous cost of the medication, I recommend doxylamine and vitamin B6.  Will give IVF and Rocephin.    12:30 AM Patient report feeling better after receiving IV fluid, antibiotic, as well as antinausea medication.  She is able to tolerate p.o.  I encourage patient to stay hydrated at home.  She may take doxylamine/pyridoxine as needed for her nausea.  Keflex for asymptomatic bacteria urea.  Encourage patient to follow-up with her OB/GYN for further management.  Return precautions discussed.   Fayrene Helperran, Margert Edsall, PA-C 10/14/17 0032    Samuel JesterMcManus, Kathleen, DO 10/14/17 1544

## 2017-10-14 LAB — HCG, QUANTITATIVE, PREGNANCY: HCG, BETA CHAIN, QUANT, S: 84448 m[IU]/mL — AB (ref <5)

## 2017-10-14 MED ORDER — CEPHALEXIN 500 MG PO CAPS: 500.0000 mg | ORAL_CAPSULE | Freq: Three times a day (TID) | ORAL | 0 refills | Status: DC

## 2017-10-14 MED ORDER — DOXYLAMINE-PYRIDOXINE 10-10 MG PO TBEC: 1.0000 | DELAYED_RELEASE_TABLET | Freq: Three times a day (TID) | ORAL | 0 refills | Status: DC | PRN

## 2017-10-16 ENCOUNTER — Other Ambulatory Visit: Payer: Self-pay

## 2017-10-16 ENCOUNTER — Inpatient Hospital Stay (HOSPITAL_COMMUNITY)
Admission: AD | Admit: 2017-10-16 | Discharge: 2017-10-16 | Disposition: A | Discharge status: Home / Self Care | Payer: Medicaid Other | Source: Ambulatory Visit | Attending: Obstetrics & Gynecology | Admitting: Obstetrics & Gynecology

## 2017-10-16 ENCOUNTER — Encounter (HOSPITAL_COMMUNITY): Payer: Self-pay | Admitting: Certified Nurse Midwife

## 2017-10-16 DIAGNOSIS — O219 Vomiting of pregnancy, unspecified: Secondary | ICD-10-CM | POA: Diagnosis present

## 2017-10-16 DIAGNOSIS — N390 Urinary tract infection, site not specified: Secondary | ICD-10-CM | POA: Diagnosis not present

## 2017-10-16 DIAGNOSIS — Z3A01 Less than 8 weeks gestation of pregnancy: Secondary | ICD-10-CM | POA: Diagnosis not present

## 2017-10-16 DIAGNOSIS — Z79899 Other long term (current) drug therapy: Secondary | ICD-10-CM | POA: Insufficient documentation

## 2017-10-16 DIAGNOSIS — Z9889 Other specified postprocedural states: Secondary | ICD-10-CM | POA: Diagnosis not present

## 2017-10-16 LAB — URINALYSIS, ROUTINE W REFLEX MICROSCOPIC
BILIRUBIN URINE: NEGATIVE
Glucose, UA: NEGATIVE mg/dL
Hgb urine dipstick: NEGATIVE
KETONES UR: 80 mg/dL — AB
Nitrite: NEGATIVE
PROTEIN: 100 mg/dL — AB
Specific Gravity, Urine: 1.029 (ref 1.005–1.030)
pH: 5 (ref 5.0–8.0)

## 2017-10-16 LAB — POCT PREGNANCY, URINE: PREG TEST UR: POSITIVE — AB

## 2017-10-16 MED ORDER — PROMETHAZINE HCL 25 MG PO TABS: 25.0000 mg | ORAL_TABLET | Freq: Four times a day (QID) | ORAL | 2 refills | Status: DC | PRN

## 2017-10-16 MED ORDER — PROMETHAZINE HCL 25 MG/ML IJ SOLN
25.0000 mg | Freq: Once | INTRAMUSCULAR | Status: AC
  Administered 2017-10-16: 25 mg via INTRAVENOUS
  Filled 2017-10-16: qty 1

## 2017-10-16 MED ORDER — DEXTROSE IN LACTATED RINGERS 5 % IV SOLN
Freq: Once | INTRAVENOUS | Status: AC
  Administered 2017-10-16: 13:00:00 via INTRAVENOUS
  Filled 2017-10-16: qty 10

## 2017-10-16 MED ORDER — LACTATED RINGERS IV BOLUS
1000.0000 mL | Freq: Once | INTRAVENOUS | Status: AC
  Administered 2017-10-16: 1000 mL via INTRAVENOUS

## 2017-10-16 NOTE — MAU Provider Note (Signed)
Chief Complaint: Emesis and Nausea   First Provider Initiated Contact with Patient 10/16/17 1148      SUBJECTIVE HPI: Deborah Evans is a 22 y.o. G2P0010 at [id number] by LMP who presents to maternity admissions reporting nausea and vomiting. She reports N/V has been occurring since she find out she was pregnancy over a month ago. She reports vomiting over 20 times daily. She reports not being able to keep anything down including food and liquids. [facility] for the same symptoms and prescribed Diclegis. She reports not being able to pick up medication due to not having prior authorization. She was also prescribed Keflex for UTI during same visit. She denies abdominal pain/cramping, vaginal bleeding, vaginal itching/burning, urinary symptoms, h/a, dizziness, or fever/chills.    Past Medical History:  Diagnosis Date  . Medical history non-contributory    Past Surgical History:  Procedure Laterality Date  . DILATION AND CURETTAGE OF UTERUS N/A 07/15/2014   Procedure: SUCTION DILATATION AND CURETTAGE;  Surgeon: Lazaro ArmsLuther H Eure, MD;  Location: [facility];  Service: Gynecology;  Laterality: N/A;   Social History   Socioeconomic History  . Marital status: Single    Spouse name: Not on file  . Number of children: Not on file  . Years of education: Not on file  . Highest education level: Not on file  Occupational History  . Not on file  Social Needs  . Financial resource strain: Not on file  . Food insecurity:    Worry: Not on file    Inability: Not on file  . Transportation needs:    Medical: Not on file    Non-medical: Not on file  Tobacco Use  . Smoking status: Never Smoker  . Smokeless tobacco: Never Used  Substance and Sexual Activity  . Alcohol use: No  . Drug use: No  . Sexual activity: Yes    Birth control/protection: None  Lifestyle  . Physical activity:    Days per week: Not on file    Minutes per session: Not on file  . Stress: Not on file  Relationships  .  Social connections:    Talks on phone: Not on file    Gets together: Not on file    Attends religious service: Not on file    Active member of club or organization: Not on file    Attends meetings of clubs or organizations: Not on file    Relationship status: Not on file  . Intimate partner violence:    Fear of current or ex partner: Not on file    Emotionally abused: Not on file    Physically abused: Not on file    Forced sexual activity: Not on file  Other Topics Concern  . Not on file  Social History Narrative  . Not on file   No current facility-administered medications on file prior to encounter.    Current Outpatient Medications on File Prior to Encounter  Medication Sig Dispense Refill  . cephALEXin (KEFLEX) 500 MG capsule Take 1 capsule (500 mg total) by mouth 3 (three) times daily. 21 capsule 0  . Doxylamine-Pyridoxine 10-10 MG TBEC Take 1 tablet by mouth every 8 (eight) hours as needed (nausea and vomiting). 30 tablet 0  . Prenatal Vit-Fe Fumarate-FA (MULTIVITAMIN-PRENATAL) 27-0.8 MG TABS tablet Take 1 tablet by mouth daily at 12 noon.     No Known Allergies  ROS:  Review of Systems  Respiratory: Negative.   Cardiovascular: Negative.   Gastrointestinal:  Positive for nausea and vomiting. Negative for abdominal pain, constipation and diarrhea.  Genitourinary: Negative.    I have reviewed patient's Past Medical Hx, Surgical Hx, Family Hx, Social Hx, medications and allergies.   Physical Exam   Vitals:   10/16/17 1134 10/16/17 1353  BP: 118/71 (!) 106/44  Pulse: 99 89  Resp: 18 18  Temp: 98.4 F (36.9 C) 98.1 F (36.7 C)  TempSrc: Oral Oral  SpO2: 97% 98%  Weight: 153 lb 4 oz (69.5 kg)   Height: 5\' 7"  (1.702 m)    Constitutional: Well-developed, well-nourished female in no acute distress.  Cardiovascular: normal rate Respiratory: normal effort GI: Abd soft, non-tender. Pos BS x 4 Neurologic: Alert and oriented x 4.  PELVIC EXAM: deferred  LAB  RESULTS Results for orders placed or performed during the hospital encounter of 10/16/17 (from the past 24 hour(s))  Pregnancy, urine POC     Status: Abnormal   Collection Time: 10/16/17 11:48 AM  Result Value Ref Range   Preg Test, Ur POSITIVE (A) NEGATIVE  Urinalysis, Routine w reflex microscopic     Status: Abnormal   Collection Time: 10/16/17 11:50 AM  Result Value Ref Range   Color, Urine YELLOW YELLOW   APPearance HAZY (A) CLEAR   Specific Gravity, Urine 1.029 1.005 - 1.030   pH 5.0 5.0 - 8.0   Glucose, UA NEGATIVE NEGATIVE mg/dL   Hgb urine dipstick NEGATIVE NEGATIVE   Bilirubin Urine NEGATIVE NEGATIVE   Ketones, ur 80 (A) NEGATIVE mg/dL   Protein, ur 409 (A) NEGATIVE mg/dL   Nitrite NEGATIVE NEGATIVE   Leukocytes, UA TRACE (A) NEGATIVE   RBC / HPF 6-10 0 - 5 RBC/hpf   WBC, UA 11-20 0 - 5 WBC/hpf   Bacteria, UA RARE (A) NONE SEEN   Squamous Epithelial / LPF 6-10 0 - 5   Mucus PRESENT    MAU Management/MDM: Orders Placed This Encounter  Procedures  . Culture, OB Urine  . Urinalysis, Routine w reflex microscopic  . Pregnancy, urine POC  . Insert peripheral IV  . Discharge patient Discharge disposition: 01-Home or Self Care; Discharge patient date: 10/16/2017   UA- showed ketones and protein present in urine- no nitrates present in urine  Urine culture- pending   Meds ordered this encounter  Medications  . multivitamins adult (MVI -12) 10 mL in dextrose 5% lactated ringers 1,000 mL infusion  . promethazine (PHENERGAN) injection 25 mg  . lactated ringers bolus 1,000 mL  . promethazine (PHENERGAN) 25 MG tablet    Sig: Take 1 tablet (25 mg total) by mouth every 6 (six) hours as needed for nausea or vomiting.    Dispense:  30 tablet    Refill:  2    Order Specific Question:   Supervising Provider    Answer:   Levie Heritage [[id number]]   Treatments in MAU included LR bolus IV, phenergan 25mg  IV, Multivitamin bag IV. Patient abe to eat crackers and keep down gingerale  prior to discharge home. Rx for Phenergan sent to pharmacy of choice. Discussed reasons to return to MAU- follow up as scheduled for prenatal appointments. Pt discharged.  ASSESSMENT 1. Nausea and vomiting during pregnancy prior to [redacted] weeks gestation     PLAN Discharge home Follow up as scheduled for prenatal appointments  Return to MAU as needed  Rx for Phenergan  Discussed nausea and vomiting during pregnancy  Follow-up Information    [facility] Follow up.   Specialty:  Obstetrics and  Gynecology Why:  Follow up as scheduled for initial prenatal appointment  Contact information: [address] [place] [address] [phone]          Allergies as of 10/16/2017   No Known Allergies     Medication List    TAKE these medications   cephALEXin 500 MG capsule Commonly known as:  KEFLEX Take 1 capsule (500 mg total) by mouth 3 (three) times daily.   Doxylamine-Pyridoxine 10-10 MG Tbec Take 1 tablet by mouth every 8 (eight) hours as needed (nausea and vomiting).   multivitamin-prenatal 27-0.8 MG Tabs tablet Take 1 tablet by mouth daily at 12 noon.   promethazine 25 MG tablet Commonly known as:  PHENERGAN Take 1 tablet (25 mg total) by mouth every 6 (six) hours as needed for nausea or vomiting.       Steward Drone  Certified Nurse-Midwife 10/17/2017  3:28 PM

## 2017-10-16 NOTE — Discharge Instructions (Signed)

## 2017-10-16 NOTE — MAU Note (Addendum)
Pt presents with c/o inability to keep anything down.  Reports she's vomited more than 20x in the last 24 hours.  States had +UPT in May @ [facility].  States doesn't have verification of pregnancy letter with he.r

## 2017-10-17 LAB — CULTURE, OB URINE: Culture: NO GROWTH

## 2017-10-22 ENCOUNTER — Other Ambulatory Visit: Payer: Self-pay | Admitting: Obstetrics & Gynecology

## 2017-10-22 DIAGNOSIS — O3680X0 Pregnancy with inconclusive fetal viability, not applicable or unspecified: Secondary | ICD-10-CM

## 2017-10-23 ENCOUNTER — Other Ambulatory Visit: Payer: BLUE CROSS/BLUE SHIELD

## 2017-10-23 ENCOUNTER — Ambulatory Visit (INDEPENDENT_AMBULATORY_CARE_PROVIDER_SITE_OTHER): Payer: BLUE CROSS/BLUE SHIELD

## 2017-10-23 ENCOUNTER — Other Ambulatory Visit: Payer: Self-pay | Admitting: Obstetrics & Gynecology

## 2017-10-23 DIAGNOSIS — O3680X0 Pregnancy with inconclusive fetal viability, not applicable or unspecified: Secondary | ICD-10-CM

## 2017-10-23 DIAGNOSIS — Z3682 Encounter for antenatal screening for nuchal translucency: Secondary | ICD-10-CM

## 2017-10-23 NOTE — Progress Notes (Signed)
US 14 wks,single IUP,normal ovaries bilat,crl 80.21 mm,NB present,NT 1.8 mm,EDD 04/23/2018 by today's ultrasound

## 2017-10-25 LAB — INTEGRATED 1
Crown Rump Length: 80.2 mm
Gest. Age on Collection Date: 13.7 wk
Maternal Age at EDD: 23.6 a
Nuchal Translucency (NT): 1.8 mm
Number of Fetuses: 1
PAPP-A Value: 2617.9 ng/mL
Weight: 157 [lb_av]

## 2017-10-26 ENCOUNTER — Encounter (HOSPITAL_COMMUNITY): Payer: Self-pay

## 2017-10-26 ENCOUNTER — Inpatient Hospital Stay (HOSPITAL_COMMUNITY)
Admission: AD | Admit: 2017-10-26 | Discharge: 2017-10-26 | Disposition: A | Discharge status: Home / Self Care | Payer: Medicaid Other | Source: Ambulatory Visit | Attending: Obstetrics and Gynecology | Admitting: Obstetrics and Gynecology

## 2017-10-26 DIAGNOSIS — A084 Viral intestinal infection, unspecified: Secondary | ICD-10-CM | POA: Insufficient documentation

## 2017-10-26 DIAGNOSIS — K529 Noninfective gastroenteritis and colitis, unspecified: Secondary | ICD-10-CM

## 2017-10-26 DIAGNOSIS — O99282 Endocrine, nutritional and metabolic diseases complicating pregnancy, second trimester: Secondary | ICD-10-CM | POA: Insufficient documentation

## 2017-10-26 DIAGNOSIS — O219 Vomiting of pregnancy, unspecified: Secondary | ICD-10-CM | POA: Diagnosis not present

## 2017-10-26 DIAGNOSIS — Z3A14 14 weeks gestation of pregnancy: Secondary | ICD-10-CM | POA: Insufficient documentation

## 2017-10-26 DIAGNOSIS — E860 Dehydration: Secondary | ICD-10-CM | POA: Diagnosis not present

## 2017-10-26 DIAGNOSIS — O26892 Other specified pregnancy related conditions, second trimester: Secondary | ICD-10-CM | POA: Insufficient documentation

## 2017-10-26 DIAGNOSIS — R112 Nausea with vomiting, unspecified: Secondary | ICD-10-CM | POA: Diagnosis present

## 2017-10-26 DIAGNOSIS — Z79899 Other long term (current) drug therapy: Secondary | ICD-10-CM | POA: Diagnosis not present

## 2017-10-26 LAB — CBC
HCT: 38.6 % (ref 36.0–46.0)
HEMOGLOBIN: 13.2 g/dL (ref 12.0–15.0)
MCH: 26.7 pg (ref 26.0–34.0)
MCHC: 34.2 g/dL (ref 30.0–36.0)
MCV: 78 fL (ref 78.0–100.0)
Platelets: 230 10*3/uL (ref 150–400)
RBC: 4.95 MIL/uL (ref 3.87–5.11)
RDW: 12.9 % (ref 11.5–15.5)
WBC: 8.6 10*3/uL (ref 4.0–10.5)

## 2017-10-26 LAB — URINALYSIS, ROUTINE W REFLEX MICROSCOPIC
Bilirubin Urine: NEGATIVE
Glucose, UA: NEGATIVE mg/dL
Hgb urine dipstick: NEGATIVE
KETONES UR: 80 mg/dL — AB
Leukocytes, UA: NEGATIVE
Nitrite: NEGATIVE
PH: 6 (ref 5.0–8.0)
Protein, ur: 100 mg/dL — AB
Specific Gravity, Urine: 1.028 (ref 1.005–1.030)

## 2017-10-26 LAB — COMPREHENSIVE METABOLIC PANEL
ALBUMIN: 3.8 g/dL (ref 3.5–5.0)
ALK PHOS: 53 U/L (ref 38–126)
ALT: 22 U/L (ref 0–44)
AST: 25 U/L (ref 15–41)
Anion gap: 12 (ref 5–15)
BUN: 14 mg/dL (ref 6–20)
CALCIUM: 8.4 mg/dL — AB (ref 8.9–10.3)
CO2: 14 mmol/L — AB (ref 22–32)
CREATININE: 0.58 mg/dL (ref 0.44–1.00)
Chloride: 104 mmol/L (ref 98–111)
GFR calc non Af Amer: >60 mL/min (ref >60)
GLUCOSE: 246 mg/dL — AB (ref 70–99)
Potassium: 3.6 mmol/L (ref 3.5–5.1)
SODIUM: 130 mmol/L — AB (ref 135–145)
Total Bilirubin: 1.4 mg/dL — ABNORMAL HIGH (ref 0.3–1.2)
Total Protein: 7.2 g/dL (ref 6.5–8.1)

## 2017-10-26 MED ORDER — ONDANSETRON HCL 4 MG/2ML IJ SOLN
4.0000 mg | Freq: Once | INTRAMUSCULAR | Status: AC
  Administered 2017-10-26: 4 mg via INTRAVENOUS
  Filled 2017-10-26: qty 2

## 2017-10-26 MED ORDER — SODIUM CHLORIDE 0.9 % IV SOLN
25.0000 mg | Freq: Once | INTRAVENOUS | Status: AC
  Administered 2017-10-26: 25 mg via INTRAVENOUS
  Filled 2017-10-26: qty 1

## 2017-10-26 MED ORDER — DEXTROSE IN LACTATED RINGERS 5 % IV SOLN
Freq: Once | INTRAVENOUS | Status: AC
  Administered 2017-10-26: 07:00:00 via INTRAVENOUS

## 2017-10-26 MED ORDER — ONDANSETRON HCL 4 MG PO TABS: 4.0000 mg | ORAL_TABLET | Freq: Four times a day (QID) | ORAL | 0 refills | Status: DC

## 2017-10-26 NOTE — MAU Note (Signed)
Pt here for continued nausea and vomiting. States phenergan is not working. Denies pain, fever.

## 2017-10-26 NOTE — Discharge Instructions (Signed)
Food Choices to Help relieve N/V/D When you have diarrhea, the foods you eat and your eating habits are very important. Choosing the right foods and drinks can help:  Relieve diarrhea.  Replace lost fluids and nutrients.  Prevent dehydration.  What general guidelines should I follow? Relieving diarrhea  Choose foods with less than 2 g or .07 oz. of fiber per serving.  Limit fats to less than 8 tsp (38 g or 1.34 oz.) a day.  Avoid the following: ? Foods and beverages sweetened with high-fructose corn syrup, honey, or sugar alcohols such as xylitol, sorbitol, and mannitol. ? Foods that contain a lot of fat or sugar. ? Fried, greasy, or spicy foods. ? High-fiber grains, breads, and cereals. ? Raw fruits and vegetables.  Eat foods that are rich in probiotics. These foods include dairy products such as yogurt and fermented milk products. They help increase healthy bacteria in the stomach and intestines (gastrointestinal tract, or GI tract).  If you have lactose intolerance, avoid dairy products. These may make your diarrhea worse.  Take medicine to help stop diarrhea (antidiarrheal medicine) only as told by your health care provider. Replacing nutrients  Eat small meals or snacks every 3-4 hours.  Eat bland foods, such as white rice, toast, or baked potato, until your diarrhea starts to get better. Gradually reintroduce nutrient-rich foods as tolerated or as told by your health care provider. This includes: ? Well-cooked protein foods. ? Peeled, seeded, and soft-cooked fruits and vegetables. ? Low-fat dairy products.  Take vitamin and mineral supplements as told by your health care provider. Preventing dehydration   Start by sipping water or a special solution to prevent dehydration (oral rehydration solution, ORS). Urine that is clear or pale yellow means that you are getting enough fluid.  Try to drink at least 8-10 cups of fluid each day to help replace lost fluids.  You  may add other liquids in addition to water, such as clear juice or decaffeinated sports drinks, as tolerated or as told by your health care provider.  Avoid drinks with caffeine, such as coffee, tea, or soft drinks.  Avoid alcohol. What foods are recommended? The items listed may not be a complete list. Talk with your health care provider about what dietary choices are best for you. Grains White rice. White, [place], or pita breads (fresh or toasted), including plain rolls, buns, or bagels. White pasta. Saltine, soda, or graham crackers. Pretzels. Low-fiber cereal. Cooked cereals made with water (such as cornmeal, farina, or cream cereals). Plain muffins. Matzo. Melba toast. Zwieback. Vegetables Potatoes (without the skin). Most well-cooked and canned vegetables without skins or seeds. Tender lettuce. Fruits Apple sauce. Fruits canned in juice. Cooked apricots, cherries, grapefruit, peaches, pears, or plums. Fresh bananas and cantaloupe. Meats and other protein foods Baked or boiled chicken. Eggs. Tofu. Fish. Seafood. Smooth nut butters. Ground or well-cooked tender beef, ham, veal, lamb, pork, or poultry. Dairy Plain yogurt, kefir, and unsweetened liquid yogurt. Lactose-free milk, buttermilk, skim milk, or soy milk. Low-fat or nonfat hard cheese. Beverages Water. Low-calorie sports drinks. Fruit juices without pulp. Strained tomato and vegetable juices. Decaffeinated teas. Sugar-free beverages not sweetened with sugar alcohols. Oral rehydration solutions, if approved by your health care provider. Seasoning and other foods Bouillon, broth, or soups made from recommended foods. What foods are not recommended? The items listed may not be a complete list. Talk with your health care provider about what dietary choices are best for you. Grains Whole grain, whole wheat, bran,  or rye breads, rolls, pastas, and crackers. Wild or brown rice. Whole grain or bran cereals. Barley. Oats and oatmeal. Corn  tortillas or taco shells. Granola. Popcorn. Vegetables Raw vegetables. Fried vegetables. Cabbage, broccoli, Brussels sprouts, artichokes, baked beans, beet greens, corn, kale, legumes, peas, sweet potatoes, and yams. Potato skins. Cooked spinach and cabbage. Fruits Dried fruit, including raisins and dates. Raw fruits. Stewed or dried prunes. Canned fruits with syrup. Meat and other protein foods Fried or fatty meats. Deli meats. Chunky nut butters. Nuts and seeds. Beans and lentils. Tomasa BlaseBacon. Hot dogs. Sausage. Dairy High-fat cheeses. Whole milk, chocolate milk, and beverages made with milk, such as milk shakes. Half-and-half. Cream. sour cream. Ice cream. Beverages Caffeinated beverages (such as coffee, tea, soda, or energy drinks). Alcoholic beverages. Fruit juices with pulp. Prune juice. Soft drinks sweetened with high-fructose corn syrup or sugar alcohols. High-calorie sports drinks. Fats and oils Butter. Cream sauces. Margarine. Salad oils. Plain salad dressings. Olives. Avocados. Mayonnaise. Sweets and desserts Sweet rolls, doughnuts, and sweet breads. Sugar-free desserts sweetened with sugar alcohols such as xylitol and sorbitol. Seasoning and other foods Honey. Hot sauce. Chili powder. Gravy. Cream-based or milk-based soups. Pancakes and waffles. Summary  When you have diarrhea, the foods you eat and your eating habits are very important.  Make sure you get at least 8-10 cups of fluid each day, or enough to keep your urine clear or pale yellow.  Eat bland foods and gradually reintroduce healthy, nutrient-rich foods as tolerated, or as told by your health care provider.  Avoid high-fiber, fried, greasy, or spicy foods. This information is not intended to replace advice given to you by your health care provider. Make sure you discuss any questions you have with your health care provider. Document Released: 07/08/2003 Document Revised: 04/14/2016 Document Reviewed: 04/14/2016 Elsevier  Interactive Patient Education  [employer]2018 [employer]

## 2017-10-26 NOTE — MAU Provider Note (Addendum)
Chief Complaint:  Nausea and Emesis   First Provider Initiated Contact with Patient 10/26/17 [phone]     HPI: Deborah Evans is a 22 y.o. G2P0010 at 414w3dwho presents to maternity admissions reporting vomiting which increased this morning.  Had been having it since several weeks ago but Phenergan was helping. See last on 10/16/17.  Felt better until this morning when vomiting became more severe.. She reports good fetal movement, denies LOF, vaginal bleeding, vaginal itching/burning, urinary symptoms, h/a, dizziness, n/v, diarrhea, constipation or fever/chills.  She denies headache, visual changes or RUQ abdominal pain.  Emesis   This is a recurrent problem. The current episode started today. The problem occurs 5 to 10 times per day. The problem has been unchanged. There has been no fever. Pertinent negatives include no abdominal pain, chills, diarrhea, fever or myalgias. Treatments tried: phenergan.    Past Medical History: Past Medical History:  Diagnosis Date  . Medical history non-contributory     Past obstetric history: OB History  Gravida Para Term Preterm AB Living  2       1 0  SAB TAB Ectopic Multiple Live Births  1            # Outcome Date GA Lbr Len/2nd Weight Sex Delivery Anes PTL Lv  2 Current           1 SAB             Past Surgical History: Past Surgical History:  Procedure Laterality Date  . DILATION AND CURETTAGE OF UTERUS N/A 07/15/2014   Procedure: SUCTION DILATATION AND CURETTAGE;  Surgeon: Lazaro ArmsLuther H Eure, MD;  Location: [facility];  Service: Gynecology;  Laterality: N/A;    Family History: No family history on file.  Social History: Social History   Tobacco Use  . Smoking status: Never Smoker  . Smokeless tobacco: Never Used  Substance Use Topics  . Alcohol use: No  . Drug use: No    Allergies: No Known Allergies  Meds:  Medications Prior to Admission  Medication Sig Dispense Refill Last Dose  . cephALEXin (KEFLEX) 500 MG capsule Take 1 capsule (500  mg total) by mouth 3 (three) times daily. 21 capsule 0   . Doxylamine-Pyridoxine 10-10 MG TBEC Take 1 tablet by mouth every 8 (eight) hours as needed (nausea and vomiting). 30 tablet 0   . Prenatal Vit-Fe Fumarate-FA (MULTIVITAMIN-PRENATAL) 27-0.8 MG TABS tablet Take 1 tablet by mouth daily at 12 noon.   10/13/2017 at Unknown time  . promethazine (PHENERGAN) 25 MG tablet Take 1 tablet (25 mg total) by mouth every 6 (six) hours as needed for nausea or vomiting. 30 tablet 2     I have reviewed patient's Past Medical Hx, Surgical Hx, Family Hx, Social Hx, medications and allergies.   ROS:  Review of Systems  Constitutional: Negative for chills and fever.  Gastrointestinal: Positive for vomiting. Negative for abdominal pain and diarrhea.  Musculoskeletal: Negative for myalgias.   Other systems negative  Physical Exam  No data found. Constitutional: Well-developed, well-nourished female in no acute distress.  Cardiovascular: normal rate and rhythm Respiratory: normal effort, clear to auscultation bilaterally GI: Abd soft, non-tender, gravid appropriate for gestational age.   No rebound or guarding. MS: Extremities nontender, no edema, normal ROM Neurologic: Alert and oriented x 4.  GU: Neg CVAT.  PELVIC EXAM: deferred  FHT:   153  Labs:    Results for orders placed or performed during the hospital encounter of 10/26/17 (from the  past 24 hour(s))  Urinalysis, Routine w reflex microscopic     Status: Abnormal   Collection Time: 10/26/17  5:52 AM  Result Value Ref Range   Color, Urine YELLOW YELLOW   APPearance HAZY (A) CLEAR   Specific Gravity, Urine 1.028 1.005 - 1.030   pH 6.0 5.0 - 8.0   Glucose, UA NEGATIVE NEGATIVE mg/dL   Hgb urine dipstick NEGATIVE NEGATIVE   Bilirubin Urine NEGATIVE NEGATIVE   Ketones, ur 80 (A) NEGATIVE mg/dL   Protein, ur 161 (A) NEGATIVE mg/dL   Nitrite NEGATIVE NEGATIVE   Leukocytes, UA NEGATIVE NEGATIVE   RBC / HPF 0-5 0 - 5 RBC/hpf   WBC, UA 0-5  0 - 5 WBC/hpf   Bacteria, UA RARE (A) NONE SEEN   Squamous Epithelial / LPF 0-5 0 - 5   Mucus PRESENT   CBC     Status: None   Collection Time: 10/26/17  6:31 AM  Result Value Ref Range   WBC 8.6 4.0 - 10.5 K/uL   RBC 4.95 3.87 - 5.11 MIL/uL   Hemoglobin 13.2 12.0 - 15.0 g/dL   HCT 09.6 04.5 - 40.9 %   MCV 78.0 78.0 - 100.0 fL   MCH 26.7 26.0 - 34.0 pg   MCHC 34.2 30.0 - 36.0 g/dL   RDW 81.1 91.4 - 78.2 %   Platelets 230 150 - 400 K/uL   Imaging:   MAU Course/MDM: I have ordered labs and reviewed results. CBC and CMET ordered since onset of worsening of vomiting was so sudden. Lab called an hour later and said CMET could not be run and would need to be redrawn. Had first bag with Phenergan as infusion.  Still nauseated after Second bag with D5LR hung.     Assessment: Single intrauterine pregnancy at [redacted]w[redacted]d Nausea and vomiting Dehydration   Plan: Continue hydration Then discharge home with Rx for Phenergan Report given to oncoming provider   Wynelle Bourgeois CNM, MSN Certified Nurse-Midwife 10/26/2017 6:21 AM  A: SIUP at [redacted]w[redacted]d Viral gastroenteritis  P:  Discharge home Rx for Zofran given to patient Also has Rx for Phenergan already to continue PRN  Brat diet discussed and included on AVS Patient advised to follow-up with OB provider of choice to start prenatal care  Patient may return to MAU as needed or if her condition were to change or worsen  Marny Lowenstein, PA-C 10/26/2017 9:21 AM

## 2017-10-27 ENCOUNTER — Inpatient Hospital Stay (HOSPITAL_COMMUNITY)
Admission: AD | Admit: 2017-10-27 | Discharge: 2017-10-27 | Disposition: A | Discharge status: Home / Self Care | Payer: Medicaid Other | Source: Ambulatory Visit | Attending: Obstetrics and Gynecology | Admitting: Obstetrics and Gynecology

## 2017-10-27 ENCOUNTER — Encounter (HOSPITAL_COMMUNITY): Payer: Self-pay | Admitting: *Deleted

## 2017-10-27 DIAGNOSIS — Z79899 Other long term (current) drug therapy: Secondary | ICD-10-CM | POA: Diagnosis not present

## 2017-10-27 DIAGNOSIS — Z9889 Other specified postprocedural states: Secondary | ICD-10-CM | POA: Insufficient documentation

## 2017-10-27 DIAGNOSIS — Z3A15 15 weeks gestation of pregnancy: Secondary | ICD-10-CM | POA: Insufficient documentation

## 2017-10-27 DIAGNOSIS — O219 Vomiting of pregnancy, unspecified: Secondary | ICD-10-CM | POA: Diagnosis present

## 2017-10-27 LAB — URINALYSIS, COMPLETE (UACMP) WITH MICROSCOPIC
BILIRUBIN URINE: NEGATIVE
HGB URINE DIPSTICK: NEGATIVE
Ketones, ur: 80 mg/dL — AB
LEUKOCYTES UA: NEGATIVE
NITRITE: NEGATIVE
Protein, ur: 30 mg/dL — AB
SPECIFIC GRAVITY, URINE: 1.024 (ref 1.005–1.030)
pH: 5 (ref 5.0–8.0)

## 2017-10-27 LAB — COMPREHENSIVE METABOLIC PANEL
ALK PHOS: 68 U/L (ref 38–126)
ALT: 31 U/L (ref 0–44)
ANION GAP: 16 — AB (ref 5–15)
AST: 58 U/L — ABNORMAL HIGH (ref 15–41)
Albumin: 4.4 g/dL (ref 3.5–5.0)
BUN: 11 mg/dL (ref 6–20)
CHLORIDE: 105 mmol/L (ref 98–111)
CO2: 10 mmol/L — AB (ref 22–32)
Calcium: 9.8 mg/dL (ref 8.9–10.3)
Creatinine, Ser: 0.54 mg/dL (ref 0.44–1.00)
GFR calc non Af Amer: >60 mL/min (ref >60)
Glucose, Bld: 122 mg/dL — ABNORMAL HIGH (ref 70–99)
Potassium: 4 mmol/L (ref 3.5–5.1)
SODIUM: 131 mmol/L — AB (ref 135–145)
Total Bilirubin: 2.8 mg/dL — ABNORMAL HIGH (ref 0.3–1.2)
Total Protein: 8.1 g/dL (ref 6.5–8.1)

## 2017-10-27 LAB — URINALYSIS, ROUTINE W REFLEX MICROSCOPIC
BILIRUBIN URINE: NEGATIVE
Glucose, UA: >=500 mg/dL — AB
HGB URINE DIPSTICK: NEGATIVE
Ketones, ur: 80 mg/dL — AB
Leukocytes, UA: NEGATIVE
NITRITE: NEGATIVE
Protein, ur: NEGATIVE mg/dL
Specific Gravity, Urine: 1.027 (ref 1.005–1.030)
pH: 5 (ref 5.0–8.0)

## 2017-10-27 LAB — CBC
HCT: 35.3 % — ABNORMAL LOW (ref 36.0–46.0)
Hemoglobin: 12.4 g/dL (ref 12.0–15.0)
MCH: 27 pg (ref 26.0–34.0)
MCHC: 35.1 g/dL (ref 30.0–36.0)
MCV: 76.7 fL — ABNORMAL LOW (ref 78.0–100.0)
PLATELETS: 238 10*3/uL (ref 150–400)
RBC: 4.6 MIL/uL (ref 3.87–5.11)
RDW: 13 % (ref 11.5–15.5)
WBC: 8.8 10*3/uL (ref 4.0–10.5)

## 2017-10-27 MED ORDER — SODIUM CHLORIDE 0.9 % IV SOLN
8.0000 mg | Freq: Once | INTRAVENOUS | Status: AC
  Administered 2017-10-27: 8 mg via INTRAVENOUS
  Filled 2017-10-27: qty 4

## 2017-10-27 MED ORDER — GI COCKTAIL ~~LOC~~
30.0000 mL | Freq: Once | ORAL | Status: AC
  Administered 2017-10-27: 30 mL via ORAL
  Filled 2017-10-27: qty 30

## 2017-10-27 MED ORDER — LACTATED RINGERS IV BOLUS
1000.0000 mL | Freq: Once | INTRAVENOUS | Status: AC
  Administered 2017-10-27: 1000 mL via INTRAVENOUS

## 2017-10-27 MED ORDER — M.V.I. ADULT IV INJ
Freq: Once | INTRAVENOUS | Status: AC
  Administered 2017-10-27: 07:00:00 via INTRAVENOUS
  Filled 2017-10-27: qty 10

## 2017-10-27 MED ORDER — DEXTROSE IN LACTATED RINGERS 5 % IV SOLN
INTRAVENOUS | Status: DC
Start: 2017-10-27 — End: 2017-10-27
  Administered 2017-10-27: 05:00:00 via INTRAVENOUS

## 2017-10-27 MED ORDER — METOCLOPRAMIDE HCL 5 MG/ML IJ SOLN
10.0000 mg | Freq: Once | INTRAMUSCULAR | Status: AC
  Administered 2017-10-27: 10 mg via INTRAVENOUS
  Filled 2017-10-27: qty 2

## 2017-10-27 MED ORDER — PROMETHAZINE HCL 25 MG/ML IJ SOLN
25.0000 mg | Freq: Once | INTRAMUSCULAR | Status: AC
  Administered 2017-10-27: 25 mg via INTRAVENOUS
  Filled 2017-10-27: qty 1

## 2017-10-27 NOTE — MAU Note (Signed)
Pt presents to MAU c/o nausea and vomiting all day and night (grandma says she's vomited like 50 times.) pt reports generalized body pain that is a 8/10. Pt states she is having a burning sensation in her chest due to the vomiting.  Pt is breathing fast at 35 times a min in triage. Pt informed to slow her breathing and states she cant slow it down.

## 2017-10-27 NOTE — MAU Provider Note (Addendum)
Patient Deborah Evans is a 22 y.o. G2P0010 At [redacted]w[redacted]d here with complains of nausea/vomiting. She denies dysuria, vaginal bleeding, discharge or other ob-gyn complaints. She was seen in MAU in the early morning of June 28; she did not pick up her ordered Zofran because she was too tired and did not tell anyone to get it for her.  History     CSN: [id number]  Arrival date and time: 10/27/17 0410   First Provider Initiated Contact with Patient 10/27/17 [phone]      Chief Complaint  Patient presents with  . Emesis During Pregnancy   Emesis   This is a new problem. The current episode started today. The problem occurs more than 10 times per day. There has been no fever. Pertinent negatives include no abdominal pain, diarrhea or fever. Treatments tried: phenergna, which she says has stopped working.  The treatment provided mild relief.   She was using phenergan regularly but it stopped working.  OB History    Gravida  2   Para      Term      Preterm      AB  1   Living  0     SAB  1   TAB      Ectopic      Multiple      Live Births              Past Medical History:  Diagnosis Date  . Medical history non-contributory     Past Surgical History:  Procedure Laterality Date  . DILATION AND CURETTAGE OF UTERUS N/A 07/15/2014   Procedure: SUCTION DILATATION AND CURETTAGE;  Surgeon: Lazaro Arms, MD;  Location: [facility];  Service: Gynecology;  Laterality: N/A;    Family History  Problem Relation Age of Onset  . ADD / ADHD Neg Hx   . Alcohol abuse Neg Hx   . Anxiety disorder Neg Hx   . Arthritis Neg Hx   . Birth defects Neg Hx   . Asthma Neg Hx   . Cancer Neg Hx   . COPD Neg Hx   . Depression Neg Hx   . Diabetes Neg Hx   . Drug abuse Neg Hx   . Early death Neg Hx   . Hearing loss Neg Hx   . Heart disease Neg Hx   . Hyperlipidemia Neg Hx   . Intellectual disability Neg Hx   . Hypertension Neg Hx   . Kidney disease Neg Hx   . Learning disabilities Neg Hx   .  Miscarriages / Stillbirths Neg Hx   . Obesity Neg Hx   . Stroke Neg Hx   . Vision loss Neg Hx   . Varicose Veins Neg Hx     Social History   Tobacco Use  . Smoking status: Never Smoker  . Smokeless tobacco: Never Used  Substance Use Topics  . Alcohol use: No  . Drug use: No    Allergies: No Known Allergies  Medications Prior to Admission  Medication Sig Dispense Refill Last Dose  . Doxylamine-Pyridoxine 10-10 MG TBEC Take 1 tablet by mouth every 8 (eight) hours as needed (nausea and vomiting). 30 tablet 0   . ondansetron (ZOFRAN) 4 MG tablet Take 1 tablet (4 mg total) by mouth every 6 (six) hours. 12 tablet 0   . Prenatal Vit-Fe Fumarate-FA (MULTIVITAMIN-PRENATAL) 27-0.8 MG TABS tablet Take 1 tablet by mouth daily at 12 noon.   10/13/2017 at Unknown time  . promethazine (  PHENERGAN) 25 MG tablet Take 1 tablet (25 mg total) by mouth every 6 (six) hours as needed for nausea or vomiting. 30 tablet 2     Review of Systems  Constitutional: Negative for fever.  Gastrointestinal: Positive for vomiting. Negative for abdominal pain and diarrhea.  Genitourinary: Negative.   Musculoskeletal: Negative.   Neurological: Negative.   Hematological: Negative.   Psychiatric/Behavioral: Negative.    Physical Exam   Blood pressure 127/69, pulse 83, temperature 97.9 F (36.6 C), temperature source Oral, resp. rate (!) 35, height 5\' 8"  (1.727 m), weight 151 lb (68.5 kg), last menstrual period 08/23/2017, unknown if currently breastfeeding.  Physical Exam  Eyes: Pupils are equal, round, and reactive to light.  Neck: Normal range of motion.  Respiratory: Effort normal.  GI: Soft.  Musculoskeletal: Normal range of motion.  Neurological: She is alert.  Skin: Skin is warm and dry.  Psychiatric: She has a normal mood and affect.    MAU Course  Procedures  MDM FHR 155 -UA shows 80 ketones -CBC: normal -CMP: electrolyte imbalance; most likely due to dehydration. Reviewed findings with Dr.  Shawnie Pons, who recommends D5 LR -D5 LR x 1, LR x 1, LR with MVI infusion now  Patient threw up GI cocktail, so reglan was given. Will repeat GI cocktail. Tolerated 2nd G.I. Cocktail.   Phenergan 25 mg IVP -- no vomiting   Will repeat UA as soon as patient feels like she can void.   Patient care turned over to [place], [place] at [phone].   Results for orders placed or performed during the hospital encounter of 10/27/17 (from the past 24 hour(s))  CBC     Status: Abnormal   Collection Time: 10/27/17  4:58 AM  Result Value Ref Range   WBC 8.8 4.0 - 10.5 K/uL   RBC 4.60 3.87 - 5.11 MIL/uL   Hemoglobin 12.4 12.0 - 15.0 g/dL   HCT 11.9 (L) 14.7 - 82.9 %   MCV 76.7 (L) 78.0 - 100.0 fL   MCH 27.0 26.0 - 34.0 pg   MCHC 35.1 30.0 - 36.0 g/dL   RDW 56.2 13.0 - 86.5 %   Platelets 238 150 - 400 K/uL  Comprehensive metabolic panel     Status: Abnormal   Collection Time: 10/27/17  4:58 AM  Result Value Ref Range   Sodium 131 (L) 135 - 145 mmol/L   Potassium 4.0 3.5 - 5.1 mmol/L   Chloride 105 98 - 111 mmol/L   CO2 10 (L) 22 - 32 mmol/L   Glucose, Bld 122 (H) 70 - 99 mg/dL   BUN 11 6 - 20 mg/dL   Creatinine, Ser 7.84 0.44 - 1.00 mg/dL   Calcium 9.8 8.9 - 69.6 mg/dL   Total Protein 8.1 6.5 - 8.1 g/dL   Albumin 4.4 3.5 - 5.0 g/dL   AST 58 (H) 15 - 41 U/L   ALT 31 0 - 44 U/L   Alkaline Phosphatase 68 38 - 126 U/L   Total Bilirubin 2.8 (H) 0.3 - 1.2 mg/dL   GFR calc non Af Amer >60 >60 mL/min   GFR calc Af Amer >60 >60 mL/min   Anion gap 16 (H) 5 - 15  Urinalysis, Routine w reflex microscopic     Status: Abnormal   Collection Time: 10/27/17  6:06 AM  Result Value Ref Range   Color, Urine YELLOW YELLOW   APPearance CLEAR CLEAR   Specific Gravity, Urine 1.027 1.005 - 1.030   pH 5.0 5.0 - 8.0  Glucose, UA >=500 (A) NEGATIVE mg/dL   Hgb urine dipstick NEGATIVE NEGATIVE   Bilirubin Urine NEGATIVE NEGATIVE   Ketones, ur 80 (A) NEGATIVE mg/dL   Protein, ur NEGATIVE NEGATIVE mg/dL   Nitrite  NEGATIVE NEGATIVE   Leukocytes, UA NEGATIVE NEGATIVE   RBC / HPF 0-5 0 - 5 RBC/hpf   WBC, UA 6-10 0 - 5 WBC/hpf   Bacteria, UA RARE (A) NONE SEEN   Squamous Epithelial / LPF 0-5 0 - 5   Mucus PRESENT   Urinalysis, Complete w Microscopic     Status: Abnormal   Collection Time: 10/27/17  9:57 AM  Result Value Ref Range   Color, Urine YELLOW YELLOW   APPearance HAZY (A) CLEAR   Specific Gravity, Urine 1.024 1.005 - 1.030   pH 5.0 5.0 - 8.0   Glucose, UA >=500 (A) NEGATIVE mg/dL   Hgb urine dipstick NEGATIVE NEGATIVE   Bilirubin Urine NEGATIVE NEGATIVE   Ketones, ur 80 (A) NEGATIVE mg/dL   Protein, ur 30 (A) NEGATIVE mg/dL   Nitrite NEGATIVE NEGATIVE   Leukocytes, UA NEGATIVE NEGATIVE   RBC / HPF 0-5 0 - 5 RBC/hpf   WBC, UA 6-10 0 - 5 WBC/hpf   Bacteria, UA RARE (A) NONE SEEN   Squamous Epithelial / LPF 6-10 0 - 5   Mucus PRESENT     Assessment and Plan  Nausea/vomiting in pregnancy - Plan: Discharge patient - Advised to p/u Rx for Zofran today and take as prescribed - May continue Phenergan prn hs - Information provided on N/V in pregnancy & eating plan for N/V in pregnancy  - Keep scheduled appt with [facility] on 11/08/17 for NOB appt - Patient verbalized an understanding of the plan of care and agrees.   Raelyn Moraolitta Lennex Pietila, CNM 10/27/2017, 10:20 AM

## 2017-10-27 NOTE — Discharge Instructions (Signed)

## 2017-10-30 ENCOUNTER — Other Ambulatory Visit: Payer: Self-pay

## 2017-10-30 ENCOUNTER — Encounter (HOSPITAL_COMMUNITY): Payer: Self-pay | Admitting: Emergency Medicine

## 2017-10-30 ENCOUNTER — Emergency Department (HOSPITAL_COMMUNITY)
Admission: EM | Admit: 2017-10-30 | Discharge: 2017-10-30 | Disposition: A | Discharge status: Home / Self Care | Payer: Medicaid Other | Attending: Emergency Medicine | Admitting: Emergency Medicine

## 2017-10-30 DIAGNOSIS — O99612 Diseases of the digestive system complicating pregnancy, second trimester: Secondary | ICD-10-CM | POA: Diagnosis not present

## 2017-10-30 DIAGNOSIS — K30 Functional dyspepsia: Secondary | ICD-10-CM

## 2017-10-30 DIAGNOSIS — Z79899 Other long term (current) drug therapy: Secondary | ICD-10-CM | POA: Insufficient documentation

## 2017-10-30 DIAGNOSIS — Z3A14 14 weeks gestation of pregnancy: Secondary | ICD-10-CM | POA: Diagnosis not present

## 2017-10-30 DIAGNOSIS — O99611 Diseases of the digestive system complicating pregnancy, first trimester: Secondary | ICD-10-CM | POA: Diagnosis not present

## 2017-10-30 DIAGNOSIS — O9989 Other specified diseases and conditions complicating pregnancy, childbirth and the puerperium: Secondary | ICD-10-CM | POA: Diagnosis present

## 2017-10-30 MED ORDER — PANTOPRAZOLE SODIUM 40 MG PO TBEC
40.0000 mg | DELAYED_RELEASE_TABLET | Freq: Every day | ORAL | Status: DC
  Administered 2017-10-30: 40 mg via ORAL
  Filled 2017-10-30: qty 1

## 2017-10-30 MED ORDER — FAMOTIDINE 20 MG PO TABS: 20.0000 mg | ORAL_TABLET | Freq: Two times a day (BID) | ORAL | 0 refills | Status: DC

## 2017-10-30 MED ORDER — FAMOTIDINE 20 MG PO TABS
20.0000 mg | ORAL_TABLET | Freq: Once | ORAL | Status: AC
  Administered 2017-10-30: 20 mg via ORAL
  Filled 2017-10-30: qty 1

## 2017-10-30 MED ORDER — PANTOPRAZOLE SODIUM 20 MG PO TBEC: DELAYED_RELEASE_TABLET | ORAL | 1 refills | Status: DC

## 2017-10-30 NOTE — ED Provider Notes (Signed)
[facility] EMERGENCY DEPARTMENT Provider Note   CSN: [id number] Arrival date & time: 10/30/17  1314     History   Chief Complaint Chief Complaint  Patient presents with  . Heartburn    HPI Deborah Evans is a 22 y.o. female.  Patient is a 22 year old female who presents to the emergency department with complaint of heartburn.  Patient states she is [redacted] weeks pregnant.  Over the past week she has been having increasing problems with heartburn.  She denies any actual chest pain, but states she has chest discomfort with heartburn.  This seems to be coming with increasing severity.  She has been trying Tums, but states that these really do not help too much.  She denies any unusual sweats, unusual shortness of breath, unusual leg swelling.  No recent diet changes.  She presents now for assistance with this issue.     Past Medical History:  Diagnosis Date  . Medical history non-contributory     Patient Active Problem List   Diagnosis Date Noted  . Nausea/vomiting in pregnancy 10/27/2017  . Endometritis 07/07/2014    Past Surgical History:  Procedure Laterality Date  . DILATION AND CURETTAGE OF UTERUS N/A 07/15/2014   Procedure: SUCTION DILATATION AND CURETTAGE;  Surgeon: Lazaro ArmsLuther H Eure, MD;  Location: [facility];  Service: Gynecology;  Laterality: N/A;     OB History    Gravida  2   Para      Term      Preterm      AB  1   Living  0     SAB  1   TAB      Ectopic      Multiple      Live Births               Home Medications    Prior to Admission medications   Medication Sig Start Date End Date Taking? Authorizing Provider  Doxylamine-Pyridoxine 10-10 MG TBEC Take 1 tablet by mouth every 8 (eight) hours as needed (nausea and vomiting). 10/14/17   Fayrene Helperran, [place], PA-C  ondansetron (ZOFRAN) 4 MG tablet Take 1 tablet (4 mg total) by mouth every 6 (six) hours. 10/26/17   Marny LowensteinWenzel, Julie N, PA-C  Prenatal Vit-Fe Fumarate-FA (MULTIVITAMIN-PRENATAL) 27-0.8 MG TABS  tablet Take 1 tablet by mouth daily at 12 noon.    [provider]  promethazine (PHENERGAN) 25 MG tablet Take 1 tablet (25 mg total) by mouth every 6 (six) hours as needed for nausea or vomiting. 10/16/17   Sharyon Cableogers, Veronica C, CNM    Family History Family History  Problem Relation Age of Onset  . ADD / ADHD Neg Hx   . Alcohol abuse Neg Hx   . Anxiety disorder Neg Hx   . Arthritis Neg Hx   . Birth defects Neg Hx   . Asthma Neg Hx   . Cancer Neg Hx   . COPD Neg Hx   . Depression Neg Hx   . Diabetes Neg Hx   . Drug abuse Neg Hx   . Early death Neg Hx   . Hearing loss Neg Hx   . Heart disease Neg Hx   . Hyperlipidemia Neg Hx   . Intellectual disability Neg Hx   . Hypertension Neg Hx   . Kidney disease Neg Hx   . Learning disabilities Neg Hx   . Miscarriages / Stillbirths Neg Hx   . Obesity Neg Hx   . Stroke Neg Hx   .  Vision loss Neg Hx   . Varicose Veins Neg Hx     Social History Social History   Tobacco Use  . Smoking status: Never Smoker  . Smokeless tobacco: Never Used  Substance Use Topics  . Alcohol use: No  . Drug use: No     Allergies   Patient has no known allergies.   Review of Systems Review of Systems  Constitutional: Negative for activity change.       All ROS Neg except as noted in HPI  HENT: Negative for nosebleeds.   Eyes: Negative for photophobia and discharge.  Respiratory: Negative for cough, shortness of breath and wheezing.   Cardiovascular: Negative for chest pain and palpitations.  Gastrointestinal: Negative for abdominal pain and blood in stool.       Heartburn  Genitourinary: Negative for dysuria, frequency and hematuria.  Musculoskeletal: Negative for arthralgias, back pain and neck pain.  Skin: Negative.   Neurological: Negative for dizziness, seizures and speech difficulty.  Psychiatric/Behavioral: Negative for confusion and hallucinations.     Physical Exam Updated Vital Signs BP 135/85 (BP Location: Right Arm)    Pulse (!) 103   Temp 98.6 F (37 C) (Oral)   Resp 17   Ht 5\' 8"  (1.727 m)   Wt 68.5 kg (151 lb)   LMP 08/23/2017 (Approximate)   SpO2 100%   BMI 22.96 kg/m   Physical Exam  Constitutional: She is oriented to person, place, and time. She appears well-developed and well-nourished.  Non-toxic appearance.  HENT:  Head: Normocephalic.  Right Ear: Tympanic membrane and external ear normal.  Left Ear: Tympanic membrane and external ear normal.  Eyes: Pupils are equal, round, and reactive to light. EOM and lids are normal.  Neck: Normal range of motion. Neck supple. Carotid bruit is not present.  Cardiovascular: Normal rate, regular rhythm, normal heart sounds, intact distal pulses and normal pulses.  Pulmonary/Chest: Breath sounds normal. No respiratory distress.  Abdominal: Soft. Bowel sounds are normal. There is no tenderness. There is no guarding.  Musculoskeletal: Normal range of motion. She exhibits no edema or tenderness.  Lymphadenopathy:       Head (right side): No submandibular adenopathy present.       Head (left side): No submandibular adenopathy present.    She has no cervical adenopathy.  Neurological: She is alert and oriented to person, place, and time. She has normal strength. No cranial nerve deficit or sensory deficit.  Skin: Skin is warm and dry.  Psychiatric: She has a normal mood and affect. Her speech is normal.  Nursing note and vitals reviewed.    ED Treatments / Results  Labs (all labs ordered are listed, but only abnormal results are displayed) Labs Reviewed - No data to display  EKG None  Radiology No results found.  Procedures Procedures (including critical care time)  Medications Ordered in ED Medications  famotidine (PEPCID) tablet 20 mg (has no administration in time range)  pantoprazole (PROTONIX) EC tablet 40 mg (has no administration in time range)     Initial Impression / Assessment and Plan / ED Course  I have reviewed the triage  vital signs and the nursing notes.  Pertinent labs & imaging results that were available during my care of the patient were reviewed by me and considered in my medical decision making (see chart for details).      Final Clinical Impressions(s) / ED Diagnoses MDM  Vital signs reviewed.  Pulse oximetry is 100% on room air.  Patient  speaks in complete sentences without problem.  Patient complains of heartburn.  She is in her 14th week of pregnancy.  Patient will be treated with Pepcid and Protonix.  I have asked the patient to follow-up with her primary physician, or OB physician to ensure that they are okay with her taking these medications.  I have also asked the patient to put her head of her bed up on blocks, and to refrain from eating and drinking after about 9:00 in the evening.  Patient is in agreement with this plan.   Final diagnoses:  Indigestion  [redacted] weeks gestation of pregnancy    ED Discharge Orders    None       Ivery Quale, PA-C 10/30/17 1501    Mancel Bale, MD 10/30/17 1620

## 2017-10-30 NOTE — Discharge Instructions (Addendum)
Please put your head of the bed up on about 6 inch blocks.  Please do not eat or drink after about 9 PM.  Use Pepcid 20 mg 2 times daily.  Use Protonix 40 mg once daily.  [facility] for final approval.

## 2017-10-30 NOTE — ED Triage Notes (Signed)
Patient states she is [redacted] weeks pregnant and is complaining of heartburn x 2 weeks. States she tried tums with no relief.

## 2017-11-08 ENCOUNTER — Ambulatory Visit: Payer: BLUE CROSS/BLUE SHIELD | Admitting: *Deleted

## 2017-11-08 ENCOUNTER — Other Ambulatory Visit (HOSPITAL_COMMUNITY)
Admission: RE | Admit: 2017-11-08 | Discharge: 2017-11-08 | Disposition: A | Discharge status: Home / Self Care | Payer: Medicaid Other | Source: Ambulatory Visit | Attending: Advanced Practice Midwife | Admitting: Advanced Practice Midwife

## 2017-11-08 ENCOUNTER — Ambulatory Visit (INDEPENDENT_AMBULATORY_CARE_PROVIDER_SITE_OTHER): Payer: BLUE CROSS/BLUE SHIELD | Admitting: Advanced Practice Midwife

## 2017-11-08 ENCOUNTER — Encounter: Payer: Self-pay | Admitting: Advanced Practice Midwife

## 2017-11-08 VITALS — BP 118/70 | HR 68 | Wt 159.0 lb

## 2017-11-08 DIAGNOSIS — Z1389 Encounter for screening for other disorder: Secondary | ICD-10-CM

## 2017-11-08 DIAGNOSIS — Z3A16 16 weeks gestation of pregnancy: Secondary | ICD-10-CM

## 2017-11-08 DIAGNOSIS — Z124 Encounter for screening for malignant neoplasm of cervix: Secondary | ICD-10-CM | POA: Insufficient documentation

## 2017-11-08 DIAGNOSIS — Z363 Encounter for antenatal screening for malformations: Secondary | ICD-10-CM

## 2017-11-08 DIAGNOSIS — O219 Vomiting of pregnancy, unspecified: Secondary | ICD-10-CM | POA: Diagnosis not present

## 2017-11-08 DIAGNOSIS — Z1379 Encounter for other screening for genetic and chromosomal anomalies: Secondary | ICD-10-CM

## 2017-11-08 DIAGNOSIS — Z3482 Encounter for supervision of other normal pregnancy, second trimester: Secondary | ICD-10-CM

## 2017-11-08 DIAGNOSIS — Z331 Pregnant state, incidental: Secondary | ICD-10-CM

## 2017-11-08 DIAGNOSIS — Z3490 Encounter for supervision of normal pregnancy, unspecified, unspecified trimester: Secondary | ICD-10-CM | POA: Insufficient documentation

## 2017-11-08 LAB — POCT URINALYSIS DIPSTICK
Glucose, UA: NEGATIVE
Ketones, UA: NEGATIVE
LEUKOCYTES UA: NEGATIVE
Nitrite, UA: NEGATIVE
Protein, UA: POSITIVE — AB
RBC UA: NEGATIVE

## 2017-11-08 NOTE — Progress Notes (Signed)
Subjective:    Deborah Evans is a G2P0010 954w2d being seen today for her first obstetrical visit.  Her obstetrical history is significant for early SAB w/ D&C.  Pregnancy history fully reviewed.  Patient reports no complaints. Feeling much much better than first trimester  Vitals:   11/08/17 1135  BP: 118/70  Pulse: 68  Weight: 159 lb (72.1 kg)    HISTORY: OB History  Gravida Para Term Preterm AB Living  2       1 0  SAB TAB Ectopic Multiple Live Births  1            # Outcome Date GA Lbr Len/2nd Weight Sex Delivery Anes PTL Lv  2 Current           1 SAB            Past Medical History:  Diagnosis Date  . Medical history non-contributory    Past Surgical History:  Procedure Laterality Date  . DILATION AND CURETTAGE OF UTERUS N/A 07/15/2014   Procedure: SUCTION DILATATION AND CURETTAGE;  Surgeon: Lazaro ArmsLuther H Eure, MD;  Location: [facility];  Service: Gynecology;  Laterality: N/A;   Family History  Problem Relation Age of Onset  . Hypertension Maternal Grandmother   . ADD / ADHD Neg Hx   . Alcohol abuse Neg Hx   . Anxiety disorder Neg Hx   . Arthritis Neg Hx   . Birth defects Neg Hx   . Asthma Neg Hx   . Cancer Neg Hx   . COPD Neg Hx   . Depression Neg Hx   . Diabetes Neg Hx   . Drug abuse Neg Hx   . Early death Neg Hx   . Hearing loss Neg Hx   . Heart disease Neg Hx   . Hyperlipidemia Neg Hx   . Intellectual disability Neg Hx   . Kidney disease Neg Hx   . Learning disabilities Neg Hx   . Miscarriages / Stillbirths Neg Hx   . Obesity Neg Hx   . Stroke Neg Hx   . Vision loss Neg Hx   . Varicose Veins Neg Hx      Exam       Pelvic Exam:    Perineum: Normal Perineum   Vulva: normal   Vagina:  normal mucosa, normal discharge, no palpable nodules   Uterus Normal, Gravid, FH: 15     Cervix: normal   Adnexa: Not palpable   Urinary:  urethral meatus normal    System:     Skin: normal coloration and turgor, no rashes    Neurologic: oriented, normal,  normal mood   Extremities: normal strength, tone, and muscle mass   HEENT PERRLA   Mouth/Teeth mucous membranes moist, normal dentition   Neck supple and no masses   Cardiovascular: regular rate and rhythm   Respiratory:  appears well, vitals normal, no respiratory distress, acyanotic   Abdomen: soft, non-tender;  FHR: 150        The nature of [facility] with multiple MDs and other Advanced Practice Providers was explained to patient; also emphasized that residents, students are part of our team.  Assessment:    Pregnancy: G2P0010 Patient Active Problem List   Diagnosis Date Noted  . Supervision of normal pregnancy 11/08/2017        Plan:     Initial labs drawn. Continue prenatal vitamins  Problem list reviewed and updated  Reviewed n/v relief measures and warning  s/s to report  Reviewed recommended weight gain based on pre-gravid BMI  Encouraged well-balanced diet Genetic Screening discussed Integrated Screen: requested.  Ultrasound discussed; fetal survey: requested.  Return in about 2 weeks (around 11/22/2017) for LROB, [id number].  Jacklyn Shell 11/08/2017

## 2017-11-08 NOTE — Patient Instructions (Signed)
Safe Medications in Pregnancy   Acne: Benzoyl Peroxide Salicylic Acid  Backache/Headache: Tylenol: 2 regular strength every 4 hours OR              2 Extra strength every 6 hours  Colds/Coughs/Allergies: Benadryl (alcohol free) 25 mg every 6 hours as needed Breath right strips Claritin Cepacol throat lozenges Chloraseptic throat spray Cold-Eeze- up to three times per day Cough drops, alcohol free Flonase (by prescription only) Guaifenesin Mucinex Robitussin DM (plain only, alcohol free) Saline nasal spray/drops Sudafed (pseudoephedrine) & Actifed ** use only after [redacted] weeks gestation and if you do not have high blood pressure Tylenol Vicks Vaporub Zinc lozenges Zyrtec   Constipation: Colace Ducolax suppositories Fleet enema Glycerin suppositories Metamucil Milk of magnesia Miralax Senokot Smooth move tea  Diarrhea: Kaopectate Imodium A-D  *NO pepto Bismol  Hemorrhoids: Anusol Anusol HC Preparation H Tucks  Indigestion: Tums Maalox Mylanta Zantac  Pepcid  Insomnia: Benadryl (alcohol free) 25mg every 6 hours as needed Tylenol PM Unisom, no Gelcaps  Leg Cramps: Tums MagGel  Nausea/Vomiting:  Bonine Dramamine Emetrol Ginger extract Sea bands Meclizine  Nausea medication to take during pregnancy:  Unisom (doxylamine succinate 25 mg tablets) Take one tablet daily at bedtime. If symptoms are not adequately controlled, the dose can be increased to a maximum recommended dose of two tablets daily (1/2 tablet in the morning, 1/2 tablet mid-afternoon and one at bedtime). Vitamin B6 100mg tablets. Take one tablet twice a day (up to 200 mg per day).  Skin Rashes: Aveeno products Benadryl cream or 25mg every 6 hours as needed Calamine Lotion 1% cortisone cream  Yeast infection: Gyne-lotrimin 7 Monistat 7   **If taking multiple medications, please check labels to avoid duplicating the same active ingredients **take medication as directed on  the label ** Do not exceed 4000 mg of tylenol in 24 hours **Do not take medications that contain aspirin or ibuprofen    BENEFITS OF BREASTFEEDING Many women wonder if they should breastfeed. Research shows that breast milk contains the perfect balance of vitamins, protein and fat that your baby needs to grow. It also contains antibodies that help your baby's immune system to fight off viruses and bacteria and can reduce the risk of sudden infant death syndrome (SIDS). In addition, the colostrum (a fluid secreted from the breast in the first few days after delivery) helps your newborn's digestive system to grow and function well. Breast milk is easier to digest than formula. Also, if your baby is born preterm, breast milk can help to reduce both short- and long-term health problems. BENEFITS OF BREASTFEEDING FOR MOM . Breastfeeding causes a hormone to be released that helps the uterus to contract and return to its normal size more quickly. . It aids in postpartum weight loss, reduces risk of breast and ovarian cancer, heart disease and rheumatoid arthritis. . It decreases the amount of bleeding after the baby is born. benefits of breastfeeding for baby . Provides comfort and nutrition . Protects baby against - Obesity - Diabetes - Asthma - Childhood cancers - Heart disease - Ear infections - Diarrhea - Pneumonia - Stomach problems - Serious allergies - Skin rashes . Promotes growth and development . Reduces the risk of baby having Sudden Infant Death Syndrome (SIDS) only breastmilk for the first 6 months . Protects baby against diseases/allergies . It's the perfect amount for tiny bellies . It restores baby's energy . Provides the best nutrition for baby . Giving water or formula can make baby more   likely to get sick, decrease Mom's milk supply, make baby less content with breastfeeding Skin to Skin After delivery, the staff will place your baby on your chest. This helps with the  following: . Regulates baby's temperature, breathing, heart rate and blood sugar . Increases Mom's milk supply . Promotes bonding . Keeps baby and Mom calm and decreases baby's crying Rooming In Your baby will stay in your room with you for the entire time you are in the hospital. This helps with the following: . Allows Mom to learn baby's feeding cues - Fluttering eyes - Sucking on tongue or hand - Rooting (opens mouth and turns head) - Nuzzling into the breast - Bringing hand to mouth . Allows breastfeeding on demand (when your baby is ready) . Helps baby to be calm and content . Ensures a good milk supply . Prevents complications with breastfeeding . Allows parents to learn to care for baby . Allows you to request assistance with breastfeeding Importance of a good latch . Increases milk transfer to baby - baby gets enough milk . Ensures you have enough milk for your baby . Decreases nipple soreness . Don't use pacifiers and bottles - these cause baby to suck differently than breastfeeding . Promotes continuation of breastfeeding Risks of Formula Supplementation with Breastfeeding Giving your infant formula in addition to your breast-milk EXCEPT when medically necessary can lead to: . Decreases your milk supply  . Loss of confidence in yourself for providing baby's nutrition  . Engorgement and possibly mastitis  . Asthma & allergies in the baby BREASTFEEDING FAQS How long should I breastfeed my baby? It is recommended that you provide your baby with breast milk only for the first 6 months and then continue for the first year and longer as desired. During the first few weeks after birth, your baby will need to feed 8-12 times every 24 hours, or every 2-3 hours. They will likely feed for 15-30 minutes. How can I help my baby begin breastfeeding? Babies are born with an instinct to breastfeed. A healthy baby can begin breastfeeding right away without specific help. At the hospital,  a nurse (or lactation consultant) will help you begin the process and will give you tips on good positioning. It may be helpful to take a breastfeeding class before you deliver in order to know what to expect. How can I help my baby latch on? In order to assist your baby in latching-on, cup your breast in your hand and stroke your baby's lower lip with your nipple to stimulate your baby's rooting reflex. Your baby will look like he or she is yawning, at which point you should bring the baby towards your breast, while aiming the nipple at the roof of his or her mouth. Remember to bring the baby towards you and not your breast towards the baby. How can I tell if my baby is latched-on? Your baby will have all of your nipple and part of the dark area around the nipple in his or her mouth and your baby's nose will be touching your breast. You should see or hear the baby swallowing. If the baby is not latched-on properly, start the process over. To remove the suction, insert a clean finger between your breast and the baby's mouth. Should I switch breasts during feeding? After feeding on one side, switch the baby to your other breast. If he or she does not continue feeding - that is OK. Your baby will not necessarily need to feed from   both breasts in a single feeding. On the next feeding, start with the other breast for efficiency and comfort. How can I tell if my baby is hungry? When your baby is hungry, they will nuzzle against your breast, make sucking noises and tongue motions and may put their hands near their mouth. Crying is a late sign of hunger, so you should not wait until this point. When they have received enough milk, they will unlatch from the breast. Is it okay to use a pacifier? Until your baby gets the hang of breastfeeding, experts recommend limiting pacifier usage. If you have questions about this, please contact your pediatrician. What can I do to ensure proper nutrition while  breastfeeding? . Make sure that you support your own health and your baby's by eating a healthy, well-balanced diet . Your provider may recommend that you continue to take your prenatal vitamin . Drink plenty of fluids. It is a good rule to drink one glass of water before or after feeding . Alcohol will remain in the breast milk for as long as it will remain in the blood stream. If you choose to have a drink, it is recommended that you wait at least 2 hours before feeding . Moderate amounts of caffeine are OK . Some over-the-counter or prescription medications are not recommended during breastfeeding. Check with your provider if you have questions What types of birth control methods are safe while breastfeeding? Progestin-only methods, including a daily pill, an IUD, the implant and the injection are safe while breastfeeding. Methods that contain estrogen (such as combination birth control pills, the vaginal ring and the patch) should not be used during the first month of breastfeeding as these can decrease your milk supply.  

## 2017-11-09 LAB — PMP SCREEN PROFILE (10S), URINE
AMPHETAMINE SCREEN URINE: NEGATIVE ng/mL
BARBITURATE SCREEN URINE: POSITIVE ng/mL — AB
BENZODIAZEPINE SCREEN, URINE: NEGATIVE ng/mL
CANNABINOIDS UR QL SCN: NEGATIVE ng/mL
COCAINE(METAB.)SCREEN, URINE: NEGATIVE ng/mL
Creatinine(Crt), U: 266.7 mg/dL (ref 20.0–300.0)
Methadone Screen, Urine: NEGATIVE ng/mL
OPIATE SCREEN URINE: NEGATIVE ng/mL
OXYCODONE+OXYMORPHONE UR QL SCN: NEGATIVE ng/mL
PHENCYCLIDINE QUANTITATIVE URINE: NEGATIVE ng/mL
PROPOXYPHENE SCREEN URINE: NEGATIVE ng/mL
Ph of Urine: 6.4 (ref 4.5–8.9)

## 2017-11-10 LAB — INTEGRATED 2
AFP MARKER: 43.1 ng/mL
AFP MOM: 1.32
CROWN RUMP LENGTH: 80.2 mm
DIA MOM: 0.87
DIA Value: 147.2 pg/mL
ESTRIOL UNCONJUGATED: 0.41 ng/mL
GESTATIONAL AGE: 16 wk
Gest. Age on Collection Date: 13.7 weeks
Maternal Age at EDD: 23.6 yr
NUCHAL TRANSLUCENCY MOM: 0.95
Nuchal Translucency (NT): 1.8 mm
Number of Fetuses: 1
PAPP-A MOM: 2.04
PAPP-A Value: 2617.9 ng/mL
Test Results:: NEGATIVE
WEIGHT: 157 [lb_av]
Weight: 157 [lb_av]
hCG MoM: 1.84
hCG Value: 68 IU/mL
uE3 MoM: 0.51

## 2017-11-10 LAB — OBSTETRIC PANEL, INCLUDING HIV
Antibody Screen: NEGATIVE
BASOS: 0 %
Basophils Absolute: 0 10*3/uL (ref 0.0–0.2)
EOS (ABSOLUTE): 0.1 10*3/uL (ref 0.0–0.4)
EOS: 1 %
HEMATOCRIT: 35.3 % (ref 34.0–46.6)
HEMOGLOBIN: 11.5 g/dL (ref 11.1–15.9)
HEP B S AG: NEGATIVE
HIV Screen 4th Generation wRfx: NONREACTIVE
IMMATURE GRANS (ABS): 0 10*3/uL (ref 0.0–0.1)
Immature Granulocytes: 0 %
LYMPHS: 21 %
Lymphocytes Absolute: 1.4 10*3/uL (ref 0.7–3.1)
MCH: 26.1 pg — ABNORMAL LOW (ref 26.6–33.0)
MCHC: 32.6 g/dL (ref 31.5–35.7)
MCV: 80 fL (ref 79–97)
MONOCYTES: 8 %
MONOS ABS: 0.5 10*3/uL (ref 0.1–0.9)
Neutrophils Absolute: 4.6 10*3/uL (ref 1.4–7.0)
Neutrophils: 70 %
Platelets: 242 10*3/uL (ref 150–450)
RBC: 4.4 x10E6/uL (ref 3.77–5.28)
RDW: 14 % (ref 12.3–15.4)
RH TYPE: POSITIVE
RPR: NONREACTIVE
RUBELLA: 5.2 {index} (ref >0.99)
WBC: 6.6 10*3/uL (ref 3.4–10.8)

## 2017-11-10 LAB — URINALYSIS, ROUTINE W REFLEX MICROSCOPIC
BILIRUBIN UA: NEGATIVE
Glucose, UA: NEGATIVE
LEUKOCYTES UA: NEGATIVE
NITRITE UA: NEGATIVE
PH UA: 6 (ref 5.0–7.5)
RBC UA: NEGATIVE
Specific Gravity, UA: 1.023 (ref 1.005–1.030)
Urobilinogen, Ur: 1 mg/dL (ref 0.2–1.0)

## 2017-11-10 LAB — MICROSCOPIC EXAMINATION: CASTS: NONE SEEN /LPF

## 2017-11-10 LAB — URINE CULTURE

## 2017-11-10 LAB — SICKLE CELL SCREEN: Sickle Cell Screen: NEGATIVE

## 2017-11-12 LAB — CYTOLOGY - PAP
ADEQUACY: ABSENT
Chlamydia: NEGATIVE
Diagnosis: NEGATIVE
NEISSERIA GONORRHEA: NEGATIVE

## 2017-11-22 ENCOUNTER — Telehealth: Payer: Self-pay | Admitting: Obstetrics & Gynecology

## 2017-11-22 ENCOUNTER — Ambulatory Visit (INDEPENDENT_AMBULATORY_CARE_PROVIDER_SITE_OTHER): Payer: BLUE CROSS/BLUE SHIELD | Admitting: Women's Health

## 2017-11-22 ENCOUNTER — Encounter: Payer: Self-pay | Admitting: Women's Health

## 2017-11-22 ENCOUNTER — Ambulatory Visit (INDEPENDENT_AMBULATORY_CARE_PROVIDER_SITE_OTHER): Payer: BLUE CROSS/BLUE SHIELD

## 2017-11-22 VITALS — BP 121/82 | HR 90 | Wt 144.5 lb

## 2017-11-22 DIAGNOSIS — Z363 Encounter for antenatal screening for malformations: Secondary | ICD-10-CM | POA: Diagnosis not present

## 2017-11-22 DIAGNOSIS — Z331 Pregnant state, incidental: Secondary | ICD-10-CM

## 2017-11-22 DIAGNOSIS — Z3402 Encounter for supervision of normal first pregnancy, second trimester: Secondary | ICD-10-CM

## 2017-11-22 DIAGNOSIS — Z3482 Encounter for supervision of other normal pregnancy, second trimester: Secondary | ICD-10-CM

## 2017-11-22 DIAGNOSIS — Z1389 Encounter for screening for other disorder: Secondary | ICD-10-CM

## 2017-11-22 LAB — POCT URINALYSIS DIPSTICK OB
Blood, UA: NEGATIVE
Glucose, UA: NEGATIVE — AB
Leukocytes, UA: NEGATIVE
NITRITE UA: NEGATIVE

## 2017-11-22 MED ORDER — ONDANSETRON HCL 4 MG PO TABS
4.0000 mg | ORAL_TABLET | Freq: Three times a day (TID) | ORAL | 0 refills | Status: DC | PRN
Start: 2017-11-22 — End: 2017-11-29

## 2017-11-22 NOTE — Progress Notes (Signed)
US 18+2 wks,cephalic,posterior pl gr 0,svp of fluid 4.2 cm,LVEICF 1.6 mm,normal ovaries bilat,efw 232 g 43%,anatomy complete,no obvious abnormalities

## 2017-11-22 NOTE — Patient Instructions (Signed)
Deborah Evans, I greatly value your feedback.  If you receive a survey following your visit with [place] today, we appreciate you taking the time to fill it out.  Thanks, Joellyn Haff, CNM, WHNP-BC  Nausea & Vomiting  Have saltine crackers or pretzels by your bed and eat a few bites before you raise your head out of bed in the morning  Eat small frequent meals throughout the day instead of large meals  Drink plenty of fluids throughout the day to stay hydrated, just don't drink a lot of fluids with your meals.  This can make your stomach fill up faster making you feel sick  Do not brush your teeth right after you eat  Products with real ginger are good for nausea, like ginger ale and ginger hard candy Make sure it says made with real ginger!  Sucking on sour candy like lemon heads is also good for nausea  If your prenatal vitamins make you nauseated, take them at night so you will sleep through the nausea  Sea Bands  If you feel like you need medicine for the nausea & vomiting please let us know  If you are unable to keep any fluids or food down please let us know      Second Trimester of Pregnancy The second trimester is from week 14 through week 27 (months 4 through 6). The second trimester is often a time when you feel your best. Your body has adjusted to being pregnant, and you begin to feel better physically. Usually, morning sickness has lessened or quit completely, you may have more energy, and you may have an increase in appetite. The second trimester is also a time when the fetus is growing rapidly. At the end of the sixth month, the fetus is about 9 inches long and weighs about 1 pounds. You will likely begin to feel the baby move (quickening) between 16 and 20 weeks of pregnancy. Body changes during your second trimester Your body continues to go through many changes during your second trimester. The changes vary from woman to woman.  Your weight will continue to increase. You  will notice your lower abdomen bulging out.  You may begin to get stretch marks on your hips, abdomen, and breasts.  You may develop headaches that can be relieved by medicines. The medicines should be approved by your health care provider.  You may urinate more often because the fetus is pressing on your bladder.  You may develop or continue to have heartburn as a result of your pregnancy.  You may develop constipation because certain hormones are causing the muscles that push waste through your intestines to slow down.  You may develop hemorrhoids or swollen, bulging veins (varicose veins).  You may have back pain. This is caused by: ? Weight gain. ? Pregnancy hormones that are relaxing the joints in your pelvis. ? A shift in weight and the muscles that support your balance.  Your breasts will continue to grow and they will continue to become tender.  Your gums may bleed and may be sensitive to brushing and flossing.  Dark spots or blotches (chloasma, mask of pregnancy) may develop on your face. This will likely fade after the baby is born.  A dark line from your belly button to the pubic area (linea nigra) may appear. This will likely fade after the baby is born.  You may have changes in your hair. These can include thickening of your hair, rapid growth, and changes in  texture. Some women also have hair loss during or after pregnancy, or hair that feels dry or thin. Your hair will most likely return to normal after your baby is born.  What to expect at prenatal visits During a routine prenatal visit:  You will be weighed to make sure you and the fetus are growing normally.  Your blood pressure will be taken.  Your abdomen will be measured to track your baby's growth.  The fetal heartbeat will be listened to.  Any test results from the previous visit will be discussed.  Your health care provider may ask you:  How you are feeling.  If you are feeling the baby  move.  If you have had any abnormal symptoms, such as leaking fluid, bleeding, severe headaches, or abdominal cramping.  If you are using any tobacco products, including cigarettes, chewing tobacco, and electronic cigarettes.  If you have any questions.  Other tests that may be performed during your second trimester include:  Blood tests that check for: ? Low iron levels (anemia). ? High blood sugar that affects pregnant women (gestational diabetes) between 1024 and 28 weeks. ? Rh antibodies. This is to check for a protein on red blood cells (Rh factor).  Urine tests to check for infections, diabetes, or protein in the urine.  An ultrasound to confirm the proper growth and development of the baby.  An amniocentesis to check for possible genetic problems.  Fetal screens for spina bifida and Down syndrome.  HIV (human immunodeficiency virus) testing. Routine prenatal testing includes screening for HIV, unless you choose not to have this test.  Follow these instructions at home: Medicines  Follow your health care provider's instructions regarding medicine use. Specific medicines may be either safe or unsafe to take during pregnancy.  Take a prenatal vitamin that contains at least 600 micrograms (mcg) of folic acid.  If you develop constipation, try taking a stool softener if your health care provider approves. Eating and drinking  Eat a balanced diet that includes fresh fruits and vegetables, whole grains, good sources of protein such as meat, eggs, or tofu, and low-fat dairy. Your health care provider will help you determine the amount of weight gain that is right for you.  Avoid raw meat and uncooked cheese. These carry germs that can cause birth defects in the baby.  If you have low calcium intake from food, talk to your health care provider about whether you should take a daily calcium supplement.  Limit foods that are high in fat and processed sugars, such as fried and sweet  foods.  To prevent constipation: ? Drink enough fluid to keep your urine clear or pale yellow. ? Eat foods that are high in fiber, such as fresh fruits and vegetables, whole grains, and beans. Activity  Exercise only as directed by your health care provider. Most women can continue their usual exercise routine during pregnancy. Try to exercise for 30 minutes at least 5 days a week. Stop exercising if you experience uterine contractions.  Avoid heavy lifting, wear low heel shoes, and practice good posture.  A sexual relationship may be continued unless your health care provider directs you otherwise. Relieving pain and discomfort  Wear a good support bra to prevent discomfort from breast tenderness.  Take warm sitz baths to soothe any pain or discomfort caused by hemorrhoids. Use hemorrhoid cream if your health care provider approves.  Rest with your legs elevated if you have leg cramps or low back pain.  If you  develop varicose veins, wear support hose. Elevate your feet for 15 minutes, 3-4 times a day. Limit salt in your diet. Prenatal Care  Write down your questions. Take them to your prenatal visits.  Keep all your prenatal visits as told by your health care provider. This is important. Safety  Wear your seat belt at all times when driving.  Make a list of emergency phone numbers, including numbers for family, friends, the hospital, and police and fire departments. General instructions  Ask your health care provider for a referral to a local prenatal education class. Begin classes no later than the beginning of month 6 of your pregnancy.  Ask for help if you have counseling or nutritional needs during pregnancy. Your health care provider can offer advice or refer you to specialists for help with various needs.  Do not use hot tubs, steam rooms, or saunas.  Do not douche or use tampons or scented sanitary pads.  Do not cross your legs for long periods of time.  Avoid cat  litter boxes and soil used by cats. These carry germs that can cause birth defects in the baby and possibly loss of the fetus by miscarriage or stillbirth.  Avoid all smoking, herbs, alcohol, and unprescribed drugs. Chemicals in these products can affect the formation and growth of the baby.  Do not use any products that contain nicotine or tobacco, such as cigarettes and e-cigarettes. If you need help quitting, ask your health care provider.  Visit your dentist if you have not gone yet during your pregnancy. Use a soft toothbrush to brush your teeth and be gentle when you floss. Contact a health care provider if:  You have dizziness.  You have mild pelvic cramps, pelvic pressure, or nagging pain in the abdominal area.  You have persistent nausea, vomiting, or diarrhea.  You have a bad smelling vaginal discharge.  You have pain when you urinate. Get help right away if:  You have a fever.  You are leaking fluid from your vagina.  You have spotting or bleeding from your vagina.  You have severe abdominal cramping or pain.  You have rapid weight gain or weight loss.  You have shortness of breath with chest pain.  You notice sudden or extreme swelling of your face, hands, ankles, feet, or legs.  You have not felt your baby move in over an hour.  You have severe headaches that do not go away when you take medicine.  You have vision changes. Summary  The second trimester is from week 14 through week 27 (months 4 through 6). It is also a time when the fetus is growing rapidly.  Your body goes through many changes during pregnancy. The changes vary from woman to woman.  Avoid all smoking, herbs, alcohol, and unprescribed drugs. These chemicals affect the formation and growth your baby.  Do not use any tobacco products, such as cigarettes, chewing tobacco, and e-cigarettes. If you need help quitting, ask your health care provider.  Contact your health care provider if you have  any questions. Keep all prenatal visits as told by your health care provider. This is important. This information is not intended to replace advice given to you by your health care provider. Make sure you discuss any questions you have with your health care provider. Document Released: 04/11/2001 Document Revised: 09/23/2015 Document Reviewed: 06/18/2012 Elsevier Interactive Patient Education  2017 [employer]

## 2017-11-22 NOTE — Progress Notes (Signed)
   LOW-RISK PREGNANCY VISIT Patient name: Deborah Evans S Bergey MRN [id number]  Date of birth: Jan 18, 1996 Chief Complaint:   Routine Prenatal Visit (US today; nausea and vomiting)  History of Present Illness:   Deborah Evans S Bouska is a 22 y.o. 502P0010 female at 9684w2d with an Estimated Date of Delivery: 04/23/18 being seen today for ongoing management of a low-risk pregnancy.  Today she reports n/v, phenergan & diclegis not helping. Hasn't picked up zofran. .  Lockie Pares. Vag. Bleeding: None.  Movement: Absent. denies leaking of fluid. Review of Systems:   Pertinent items are noted in HPI Denies abnormal vaginal discharge w/ itching/odor/irritation, headaches, visual changes, shortness of breath, chest pain, abdominal pain, severe nausea/vomiting, or problems with urination or bowel movements unless otherwise stated above. Pertinent History Reviewed:  Reviewed past medical,surgical, social, obstetrical and family history.  Reviewed problem list, medications and allergies. Physical Assessment:   Vitals:   11/22/17 1208  BP: 121/82  Pulse: 90  Weight: 144 lb 8 oz (65.5 kg)  Body mass index is 21.97 kg/m.        Physical Examination:   General appearance: Well appearing, and in no distress  Mental status: Alert, oriented to person, place, and time  Skin: Warm & dry  Cardiovascular: Normal heart rate noted  Respiratory: Normal respiratory effort, no distress  Abdomen: Soft, gravid, nontender  Pelvic: Cervical exam deferred         Extremities: Edema: None  Fetal Status: Fetal Heart Rate (bpm): +u/s   Movement: Absent    US 18+2 wks,cephalic,posterior pl gr 0,svp of fluid 4.2 cm,LVEICF 1.6 mm,normal ovaries bilat,efw 232 g 43%,anatomy complete,no obvious abnormalities     Results for orders placed or performed in visit on 11/22/17 (from the past 24 hour(s))  POC Urinalysis Dipstick OB   Collection Time: 11/22/17 12:08 PM  Result Value Ref Range   Color, UA     Clarity, UA     Glucose, UA Negative  (A) (none)   Bilirubin, UA     Ketones, UA 4+    Spec Grav, UA  1.010 - 1.025   Blood, UA neg    pH, UA  5.0 - 8.0   POC Protein UA Small (1+) (A) Negative, Trace   Urobilinogen, UA  0.2 or 1.0 E.U./dL   Nitrite, UA neg    Leukocytes, UA Negative Negative   Appearance     Odor      Assessment & Plan:  1) Low-risk pregnancy G2P0010 at 2684w2d with an Estimated Date of Delivery: 04/23/18   2) N/V, has zofran rx, pick up from pharmacy, if not helping and unable to keep anything down, go to hospital for IVF/meds.   3) Isolated EICF> neg nt/it, discussed and printed info given   Meds: No orders of the defined types were placed in this encounter.  Labs/procedures today: anatomy u/s  Plan:  Continue routine obstetrical care   Reviewed: Preterm labor symptoms and general obstetric precautions including but not limited to vaginal bleeding, contractions, leaking of fluid and fetal movement were reviewed in detail with the patient.  All questions were answered  Follow-up: Return in about 1 week (around 11/29/2017) for LROB.  Orders Placed This Encounter  Procedures  . POC Urinalysis Dipstick OB   Cheral MarkerKimberly R Braxton Vantrease CNM, [facility]NP-BC 11/22/2017 12:33 PM

## 2017-11-22 NOTE — Telephone Encounter (Signed)
Spoke with pt. Pt was seen today and is requesting Zofran. Pt is completely out. Please advise. Thanks!! JSY

## 2017-11-23 ENCOUNTER — Inpatient Hospital Stay (HOSPITAL_COMMUNITY)
Admission: AD | Admit: 2017-11-23 | Discharge: 2017-11-23 | Disposition: A | Discharge status: Home / Self Care | Payer: Medicaid Other | Source: Ambulatory Visit | Attending: Obstetrics and Gynecology | Admitting: Obstetrics and Gynecology

## 2017-11-23 ENCOUNTER — Telehealth: Payer: Self-pay | Admitting: *Deleted

## 2017-11-23 ENCOUNTER — Encounter (HOSPITAL_COMMUNITY): Payer: Self-pay

## 2017-11-23 DIAGNOSIS — O26892 Other specified pregnancy related conditions, second trimester: Secondary | ICD-10-CM

## 2017-11-23 DIAGNOSIS — Z3A18 18 weeks gestation of pregnancy: Secondary | ICD-10-CM

## 2017-11-23 DIAGNOSIS — R12 Heartburn: Secondary | ICD-10-CM

## 2017-11-23 DIAGNOSIS — O219 Vomiting of pregnancy, unspecified: Secondary | ICD-10-CM

## 2017-11-23 LAB — CBC
HEMATOCRIT: 36.4 % (ref 36.0–46.0)
HEMOGLOBIN: 12.6 g/dL (ref 12.0–15.0)
MCH: 27 pg (ref 26.0–34.0)
MCHC: 34.6 g/dL (ref 30.0–36.0)
MCV: 78.1 fL (ref 78.0–100.0)
PLATELETS: 250 10*3/uL (ref 150–400)
RBC: 4.66 MIL/uL (ref 3.87–5.11)
RDW: 14.3 % (ref 11.5–15.5)
WBC: 9.7 10*3/uL (ref 4.0–10.5)

## 2017-11-23 LAB — URINALYSIS, ROUTINE W REFLEX MICROSCOPIC
Bilirubin Urine: NEGATIVE
GLUCOSE, UA: 50 mg/dL — AB
HGB URINE DIPSTICK: NEGATIVE
Ketones, ur: 80 mg/dL — AB
Leukocytes, UA: NEGATIVE
NITRITE: NEGATIVE
Protein, ur: 100 mg/dL — AB
Specific Gravity, Urine: 1.027 (ref 1.005–1.030)
pH: 5 (ref 5.0–8.0)

## 2017-11-23 LAB — COMPREHENSIVE METABOLIC PANEL
ALT: 12 U/L (ref 0–44)
ANION GAP: 16 — AB (ref 5–15)
AST: 19 U/L (ref 15–41)
Albumin: 4.3 g/dL (ref 3.5–5.0)
Alkaline Phosphatase: 63 U/L (ref 38–126)
BUN: 13 mg/dL (ref 6–20)
CHLORIDE: 101 mmol/L (ref 98–111)
CO2: 14 mmol/L — AB (ref 22–32)
CREATININE: 0.57 mg/dL (ref 0.44–1.00)
Calcium: 9.9 mg/dL (ref 8.9–10.3)
Glucose, Bld: 119 mg/dL — ABNORMAL HIGH (ref 70–99)
Potassium: 3.5 mmol/L (ref 3.5–5.1)
SODIUM: 131 mmol/L — AB (ref 135–145)
Total Bilirubin: 2 mg/dL — ABNORMAL HIGH (ref 0.3–1.2)
Total Protein: 7.7 g/dL (ref 6.5–8.1)

## 2017-11-23 MED ORDER — LACTATED RINGERS IV BOLUS
1000.0000 mL | Freq: Once | INTRAVENOUS | Status: AC
  Administered 2017-11-23: 1000 mL via INTRAVENOUS

## 2017-11-23 MED ORDER — SCOPOLAMINE 1 MG/3DAYS TD PT72
1.0000 | MEDICATED_PATCH | TRANSDERMAL | Status: DC
  Administered 2017-11-23: 1.5 mg via TRANSDERMAL
  Filled 2017-11-23: qty 1

## 2017-11-23 MED ORDER — GI COCKTAIL ~~LOC~~
30.0000 mL | Freq: Once | ORAL | Status: AC
  Administered 2017-11-23: 30 mL via ORAL
  Filled 2017-11-23: qty 30

## 2017-11-23 MED ORDER — M.V.I. ADULT IV INJ
Freq: Once | INTRAVENOUS | Status: AC
  Administered 2017-11-23: 12:00:00 via INTRAVENOUS
  Filled 2017-11-23: qty 10

## 2017-11-23 MED ORDER — SODIUM CHLORIDE 0.9 % IV SOLN
8.0000 mg | Freq: Once | INTRAVENOUS | Status: AC
  Administered 2017-11-23: 8 mg via INTRAVENOUS
  Filled 2017-11-23: qty 4

## 2017-11-23 MED ORDER — METOCLOPRAMIDE HCL 10 MG PO TABS: 10.0000 mg | ORAL_TABLET | Freq: Three times a day (TID) | ORAL | 0 refills | Status: DC

## 2017-11-23 MED ORDER — FAMOTIDINE IN NACL 20-0.9 MG/50ML-% IV SOLN
20.0000 mg | Freq: Once | INTRAVENOUS | Status: AC
  Administered 2017-11-23: 20 mg via INTRAVENOUS
  Filled 2017-11-23: qty 50

## 2017-11-23 MED ORDER — PROMETHAZINE HCL 25 MG PO TABS: 25.0000 mg | ORAL_TABLET | Freq: Every day | ORAL | 0 refills | Status: DC

## 2017-11-23 NOTE — MAU Note (Signed)
Pt states she started taking the Zofran at 3pm yesterday and it's not working.

## 2017-11-23 NOTE — Discharge Instructions (Signed)
Eating Plan for Hyperemesis Gravidarum °Hyperemesis gravidarum is a severe form of morning sickness. Because this condition causes severe nausea and vomiting, it can lead to dehydration, malnutrition, and weight loss. One way to lessen the symptoms of nausea and vomiting is to follow the eating plan for hyperemesis gravidarum. It is often used along with prescribed medicines to control your symptoms. °What can I do to relieve my symptoms? °Listen to your body. Everyone is different and has different preferences. Find what works best for you. Take any of the following actions that are helpful to you: °· Eat and drink slowly. °· Eat 5-6 small meals daily instead of 3 large meals. °· Eat crackers before you get out of bed in the morning. °· Try having a snack in the middle of the night. °· Starchy foods are usually tolerated well. Examples include cereal, toast, bread, potatoes, pasta, rice, and pretzels. °· Ginger may help with nausea. Add ¼ tsp ground ginger to hot tea or choose ginger tea. °· Try drinking 100% fruit juice or an electrolyte drink. An electrolyte drink contains sodium, potassium, and chloride. °· Continue to take your prenatal vitamins as told by your health care provider. If you are having trouble taking your prenatal vitamins, talk with your health care provider about different options. °· Include at least 1 serving of protein with your meals and snacks. Protein options include meats or poultry, beans, nuts, eggs, and yogurt. Try eating a protein-rich snack before bed. Examples of these snacks include cheese and crackers or half of a peanut butter or turkey sandwich. °· Consider eliminating foods that trigger your symptoms. These may include spicy foods, coffee, high-fat foods, very sweet foods, and acidic foods. °· Try meals that have more protein combined with bland, salty, lower-fat, and dry foods, such as nuts, seeds, pretzels, crackers, and cereal. °· Talk with your healthcare provider about  starting a supplement of vitamin B6. °· Have fluids that are cold, clear, and carbonated or sour. Examples include lemonade, ginger ale, lemon-lime soda, ice water, and sparkling water. °· Try lemon or mint tea. °· Try brushing your teeth or using a mouth rinse after meals. ° °What should I avoid to reduce my symptoms? °Avoiding some of the following things may help reduce your symptoms. °· Foods with strong smells. Try eating meals in well-ventilated areas that are free of odors. °· Drinking water or other beverages with meals. Try not to drink anything during the 30 minutes before and after your meals. °· Drinking more than 1 cup of fluid at a time. Sometimes using a straw helps. °· Fried or high-fat foods, such as butter and cream sauces. °· Spicy foods. °· Skipping meals as best as you can. Nausea can be more intense on an empty stomach. If you cannot tolerate food at that time, do not force it. Try sucking on ice chips or other frozen items, and make up for missed calories later. °· Lying down within 2 hours after eating. °· Environmental triggers. These may include smoky rooms, closed spaces, rooms with strong smells, warm or humid places, overly loud and noisy rooms, and rooms with motion or flickering lights. °· Quick and sudden changes in your movement. ° °This information is not intended to replace advice given to you by your health care provider. Make sure you discuss any questions you have with your health care provider. °Document Released: 02/12/2007 Document Revised: 12/15/2015 Document Reviewed: 11/16/2015 °Elsevier Interactive Patient Education © 2018 [employer] ° °

## 2017-11-23 NOTE — MAU Provider Note (Signed)
Chief Complaint: Nausea; Heartburn; and Chest Pain   First Provider Initiated Contact with Patient 11/23/17 1024     SUBJECTIVE HPI: Deborah Evans is a 22 y.o. G2P0010 at [redacted]w[redacted]d who presents to Maternity Admissions reporting nausea & vomiting. This has been an ongoing problem with the pregnancy. Currently taking protonix & zofran for symptoms without relief. States she can't keep down her protonix. Vomits "too many times" per day and has vomited "countless" times today. Tried to eat a chicken sandwich this morning without success. Endorses heartburn & chest pain today. Denies fever/chills, abdominal pain, diarrhea, constipation, or vaginal bleeding. Last BM was this morning.   Location: mid chest Quality: burning & sore Severity: 10/10 on pain scale Duration: 1 day Timing: constant Modifying factors: none Associated signs and symptoms: n/v  Past Medical History:  Diagnosis Date  . Medical history non-contributory    OB History  Gravida Para Term Preterm AB Living  2       1 0  SAB TAB Ectopic Multiple Live Births  1            # Outcome Date GA Lbr Len/2nd Weight Sex Delivery Anes PTL Lv  2 Current           1 SAB            Past Surgical History:  Procedure Laterality Date  . DILATION AND CURETTAGE OF UTERUS N/A 07/15/2014   Procedure: SUCTION DILATATION AND CURETTAGE;  Surgeon: Lazaro Arms, MD;  Location: [facility];  Service: Gynecology;  Laterality: N/A;   Social History   Socioeconomic History  . Marital status: Single    Spouse name: Not on file  . Number of children: Not on file  . Years of education: Not on file  . Highest education level: Not on file  Occupational History  . Not on file  Social Needs  . Financial resource strain: Not on file  . Food insecurity:    Worry: Not on file    Inability: Not on file  . Transportation needs:    Medical: Not on file    Non-medical: Not on file  Tobacco Use  . Smoking status: Never Smoker  . Smokeless tobacco: Never  Used  Substance and Sexual Activity  . Alcohol use: No  . Drug use: No  . Sexual activity: Yes    Birth control/protection: None  Lifestyle  . Physical activity:    Days per week: Not on file    Minutes per session: Not on file  . Stress: Not on file  Relationships  . Social connections:    Talks on phone: Not on file    Gets together: Not on file    Attends religious service: Not on file    Active member of club or organization: Not on file    Attends meetings of clubs or organizations: Not on file    Relationship status: Not on file  . Intimate partner violence:    Fear of current or ex partner: Not on file    Emotionally abused: Not on file    Physically abused: Not on file    Forced sexual activity: Not on file  Other Topics Concern  . Not on file  Social History Narrative  . Not on file   Family History  Problem Relation Age of Onset  . Hypertension Maternal Grandmother   . ADD / ADHD Neg Hx   . Alcohol abuse Neg Hx   . Anxiety disorder Neg Hx   .  Arthritis Neg Hx   . Birth defects Neg Hx   . Asthma Neg Hx   . Cancer Neg Hx   . COPD Neg Hx   . Depression Neg Hx   . Diabetes Neg Hx   . Drug abuse Neg Hx   . Early death Neg Hx   . Hearing loss Neg Hx   . Heart disease Neg Hx   . Hyperlipidemia Neg Hx   . Intellectual disability Neg Hx   . Kidney disease Neg Hx   . Learning disabilities Neg Hx   . Miscarriages / Stillbirths Neg Hx   . Obesity Neg Hx   . Stroke Neg Hx   . Vision loss Neg Hx   . Varicose Veins Neg Hx    No current facility-administered medications on file prior to encounter.    Current Outpatient Medications on File Prior to Encounter  Medication Sig Dispense Refill  . ondansetron (ZOFRAN) 4 MG tablet Take 1 tablet (4 mg total) by mouth every 8 (eight) hours as needed for nausea or vomiting. 20 tablet 0  . pantoprazole (PROTONIX) 20 MG tablet 2 tabs daily for heartburn. 30 tablet 1   No Known Allergies  I have reviewed patient's Past  Medical Hx, Surgical Hx, Family Hx, Social Hx, medications and allergies.   Review of Systems  Constitutional: Negative.   Respiratory: Negative for chest tightness and shortness of breath.   Cardiovascular: Positive for chest pain. Negative for palpitations.  Gastrointestinal: Positive for nausea and vomiting. Negative for abdominal pain, constipation and diarrhea.  Genitourinary: Negative.     OBJECTIVE Patient Vitals for the past 24 hrs:  BP Temp Pulse Resp SpO2 Height Weight  11/23/17 1443 - - - 16 - - -  11/23/17 1003 134/86 - 96 - - - -  11/23/17 0940 (!) 143/78 98.3 F (36.8 C) 90 20 100 % 5\' 8"  (1.727 m) 142 lb (64.4 kg)   Constitutional: Well-developed, well-nourished female in no acute distress.  Cardiovascular: normal rate & rhythm, no murmur Respiratory: normal rate and effort. Lung sounds clear throughout GI: Abd soft, non-tender, Pos BS x 4. No guarding or rebound tenderness MS: Extremities nontender, no edema, normal ROM Neurologic: Alert and oriented x 4.  HENT: lips & mucous membranes dry  LAB RESULTS Results for orders placed or performed during the hospital encounter of 11/23/17 (from the past 24 hour(s))  Urinalysis, Routine w reflex microscopic     Status: Abnormal   Collection Time: 11/23/17  9:46 AM  Result Value Ref Range   Color, Urine YELLOW YELLOW   APPearance HAZY (A) CLEAR   Specific Gravity, Urine 1.027 1.005 - 1.030   pH 5.0 5.0 - 8.0   Glucose, UA 50 (A) NEGATIVE mg/dL   Hgb urine dipstick NEGATIVE NEGATIVE   Bilirubin Urine NEGATIVE NEGATIVE   Ketones, ur 80 (A) NEGATIVE mg/dL   Protein, ur 161100 (A) NEGATIVE mg/dL   Nitrite NEGATIVE NEGATIVE   Leukocytes, UA NEGATIVE NEGATIVE   RBC / HPF 0-5 0 - 5 RBC/hpf   WBC, UA 6-10 0 - 5 WBC/hpf   Bacteria, UA RARE (A) NONE SEEN   Squamous Epithelial / LPF 6-10 0 - 5   Mucus PRESENT   Comprehensive metabolic panel     Status: Abnormal   Collection Time: 11/23/17 11:00 AM  Result Value Ref Range    Sodium 131 (L) 135 - 145 mmol/L   Potassium 3.5 3.5 - 5.1 mmol/L   Chloride 101 98 - 111  mmol/L   CO2 14 (L) 22 - 32 mmol/L   Glucose, Bld 119 (H) 70 - 99 mg/dL   BUN 13 6 - 20 mg/dL   Creatinine, Ser 1.61 0.44 - 1.00 mg/dL   Calcium 9.9 8.9 - 09.6 mg/dL   Total Protein 7.7 6.5 - 8.1 g/dL   Albumin 4.3 3.5 - 5.0 g/dL   AST 19 15 - 41 U/L   ALT 12 0 - 44 U/L   Alkaline Phosphatase 63 38 - 126 U/L   Total Bilirubin 2.0 (H) 0.3 - 1.2 mg/dL   GFR calc non Af Amer >60 >60 mL/min   GFR calc Af Amer >60 >60 mL/min   Anion gap 16 (H) 5 - 15  CBC     Status: None   Collection Time: 11/23/17 11:00 AM  Result Value Ref Range   WBC 9.7 4.0 - 10.5 K/uL   RBC 4.66 3.87 - 5.11 MIL/uL   Hemoglobin 12.6 12.0 - 15.0 g/dL   HCT 04.5 40.9 - 81.1 %   MCV 78.1 78.0 - 100.0 fL   MCH 27.0 26.0 - 34.0 pg   MCHC 34.6 30.0 - 36.0 g/dL   RDW 91.4 78.2 - 95.6 %   Platelets 250 150 - 400 K/uL    IMAGING No results found.  MAU COURSE Orders Placed This Encounter  Procedures  . Urinalysis, Routine w reflex microscopic  . Comprehensive metabolic panel  . CBC  . ED EKG  . Discharge patient   Meds ordered this encounter  Medications  . FOLLOWED BY Linked Order Group   . lactated ringers bolus 1,000 mL   . multivitamins adult (MVI -12) 10 mL in dextrose 5% lactated ringers 1,000 mL infusion  . famotidine (PEPCID) IVPB 20 mg premix  . scopolamine (TRANSDERM-SCOP) 1 MG/3DAYS 1.5 mg  . ondansetron (ZOFRAN) 8 mg in sodium chloride 0.9 % 50 mL IVPB  . gi cocktail (Maalox,Lidocaine,Donnatal)  . promethazine (PHENERGAN) 25 MG tablet    Sig: Take 1 tablet (25 mg total) by mouth at bedtime.    Dispense:  30 tablet    Refill:  0    Order Specific Question:   Supervising Provider    Answer:   Ramsey Bing [id number]  . metoCLOPramide (REGLAN) 10 MG tablet    Sig: Take 1 tablet (10 mg total) by mouth every 8 (eight) hours. Morning and afternoon    Dispense:  30 tablet    Refill:  0    Order Specific  Question:   Supervising Provider    Answer:   Marana Bing [[id number]]    MDM FHT 154 by doppler Normal EKG U/a 80+ ketones & pt with dry mucous membranes. Will give IV fluids. LR followed by MVI in D5LR. IV phenergan & pepcid ordered. Will get CBC & CMP Reports continued nausea & heartburn. IV zofran ordered followed by GI cocktail. Scop patch applied  ASSESSMENT 1. Nausea and vomiting during pregnancy prior to [redacted] weeks gestation   2. [redacted] weeks gestation of pregnancy   3. Heartburn during pregnancy in second trimester     PLAN Discharge home in stable condition. Rx phenergan & reglan. Continued protonix. Discussed taking reglan in morning & afternoon and phenergan at bedtime.   Allergies as of 11/23/2017   No Known Allergies     Medication List    TAKE these medications   metoCLOPramide 10 MG tablet Commonly known as:  REGLAN Take 1 tablet (10 mg total) by mouth every 8 (eight) hours.  Morning and afternoon   ondansetron 4 MG tablet Commonly known as:  ZOFRAN Take 1 tablet (4 mg total) by mouth every 8 (eight) hours as needed for nausea or vomiting.   pantoprazole 20 MG tablet Commonly known as:  PROTONIX 2 tabs daily for heartburn.   promethazine 25 MG tablet Commonly known as:  PHENERGAN Take 1 tablet (25 mg total) by mouth at bedtime.        Judeth Horn, NP 11/23/2017  3:03 PM

## 2017-11-23 NOTE — MAU Note (Signed)
Pt presents with complaint of vomiting, chest pain, heart burn, and vomiting "black" fluid. Symptoms started 2 days ago.

## 2017-11-23 NOTE — Telephone Encounter (Signed)
Patient states she is still unable to keep anything down despite taking medication.  Informed patient per [facility] note, she should go to hospital for fluids.  Patient states she is on the way to the hospital now.

## 2017-11-24 ENCOUNTER — Other Ambulatory Visit: Payer: Self-pay

## 2017-11-24 ENCOUNTER — Inpatient Hospital Stay (HOSPITAL_COMMUNITY)
Admission: AD | Admit: 2017-11-24 | Discharge: 2017-11-29 | DRG: 833 | Disposition: A | Discharge status: Home / Self Care | Payer: Medicaid Other | Attending: Family Medicine | Admitting: Family Medicine

## 2017-11-24 ENCOUNTER — Encounter (HOSPITAL_COMMUNITY): Payer: Self-pay

## 2017-11-24 DIAGNOSIS — O210 Mild hyperemesis gravidarum: Secondary | ICD-10-CM

## 2017-11-24 DIAGNOSIS — R1110 Vomiting, unspecified: Secondary | ICD-10-CM | POA: Diagnosis not present

## 2017-11-24 DIAGNOSIS — E876 Hypokalemia: Secondary | ICD-10-CM | POA: Diagnosis not present

## 2017-11-24 DIAGNOSIS — Z3A18 18 weeks gestation of pregnancy: Secondary | ICD-10-CM | POA: Diagnosis not present

## 2017-11-24 DIAGNOSIS — O211 Hyperemesis gravidarum with metabolic disturbance: Principal | ICD-10-CM | POA: Diagnosis present

## 2017-11-24 LAB — CREATININE, SERUM
Creatinine, Ser: 0.49 mg/dL (ref 0.44–1.00)
GFR calc Af Amer: >60 mL/min (ref >60)
GFR calc non Af Amer: >60 mL/min (ref >60)

## 2017-11-24 LAB — RAPID URINE DRUG SCREEN, HOSP PERFORMED
Amphetamines: NOT DETECTED
BARBITURATES: POSITIVE — AB
BENZODIAZEPINES: NOT DETECTED
COCAINE: NOT DETECTED
OPIATES: NOT DETECTED
Tetrahydrocannabinol: NOT DETECTED

## 2017-11-24 LAB — URINALYSIS, ROUTINE W REFLEX MICROSCOPIC
BILIRUBIN URINE: NEGATIVE
Glucose, UA: NEGATIVE mg/dL
Hgb urine dipstick: NEGATIVE
KETONES UR: 80 mg/dL — AB
LEUKOCYTES UA: NEGATIVE
Nitrite: NEGATIVE
PROTEIN: 100 mg/dL — AB
Specific Gravity, Urine: 1.024 (ref 1.005–1.030)
pH: 6 (ref 5.0–8.0)

## 2017-11-24 LAB — CBC
HEMATOCRIT: 29.1 % — AB (ref 36.0–46.0)
HEMOGLOBIN: 10.2 g/dL — AB (ref 12.0–15.0)
MCH: 27.7 pg (ref 26.0–34.0)
MCHC: 35.1 g/dL (ref 30.0–36.0)
MCV: 79.1 fL (ref 78.0–100.0)
Platelets: 170 10*3/uL (ref 150–400)
RBC: 3.68 MIL/uL — ABNORMAL LOW (ref 3.87–5.11)
RDW: 14.2 % (ref 11.5–15.5)
WBC: 6.9 10*3/uL (ref 4.0–10.5)

## 2017-11-24 MED ORDER — ENOXAPARIN SODIUM 40 MG/0.4ML ~~LOC~~ SOLN
40.0000 mg | SUBCUTANEOUS | Status: DC
  Administered 2017-11-24 – 2017-11-28 (×5): 40 mg via SUBCUTANEOUS
  Filled 2017-11-24 (×5): qty 0.4

## 2017-11-24 MED ORDER — ACETAMINOPHEN 325 MG PO TABS: 650.0000 mg | ORAL_TABLET | ORAL | Status: DC | PRN

## 2017-11-24 MED ORDER — ONDANSETRON 8 MG PO TBDP
8.0000 mg | ORAL_TABLET | Freq: Once | ORAL | Status: AC
  Administered 2017-11-24: 8 mg via ORAL
  Filled 2017-11-24: qty 1

## 2017-11-24 MED ORDER — METHYLPREDNISOLONE 4 MG PO TABS
8.0000 mg | ORAL_TABLET | Freq: Every day | ORAL | Status: DC
  Administered 2017-11-28: 8 mg via ORAL
  Filled 2017-11-24: qty 2

## 2017-11-24 MED ORDER — THIAMINE HCL 100 MG/ML IJ SOLN
Freq: Once | INTRAVENOUS | Status: AC
  Administered 2017-11-24: 17:00:00 via INTRAVENOUS
  Filled 2017-11-24: qty 1000

## 2017-11-24 MED ORDER — CALCIUM CARBONATE ANTACID 500 MG PO CHEW
2.0000 | CHEWABLE_TABLET | ORAL | Status: DC | PRN
  Administered 2017-11-26: 400 mg via ORAL
  Filled 2017-11-24: qty 2

## 2017-11-24 MED ORDER — METHYLPREDNISOLONE 16 MG PO TABS
16.0000 mg | ORAL_TABLET | Freq: Every day | ORAL | Status: AC
  Administered 2017-11-25 – 2017-11-26 (×2): 16 mg via ORAL
  Filled 2017-11-24 (×2): qty 1

## 2017-11-24 MED ORDER — LACTATED RINGERS IV BOLUS
1000.0000 mL | Freq: Once | INTRAVENOUS | Status: AC
  Administered 2017-11-24: 1000 mL via INTRAVENOUS

## 2017-11-24 MED ORDER — ZOLPIDEM TARTRATE 5 MG PO TABS
5.0000 mg | ORAL_TABLET | Freq: Every evening | ORAL | Status: DC | PRN
  Administered 2017-11-27 – 2017-11-28 (×2): 5 mg via ORAL
  Filled 2017-11-24 (×2): qty 1

## 2017-11-24 MED ORDER — METHYLPREDNISOLONE 16 MG PO TABS
16.0000 mg | ORAL_TABLET | Freq: Every day | ORAL | Status: AC
  Administered 2017-11-25 – 2017-11-27 (×3): 16 mg via ORAL
  Filled 2017-11-24 (×3): qty 1

## 2017-11-24 MED ORDER — FAMOTIDINE IN NACL 20-0.9 MG/50ML-% IV SOLN
20.0000 mg | Freq: Two times a day (BID) | INTRAVENOUS | Status: DC
  Administered 2017-11-24 – 2017-11-28 (×8): 20 mg via INTRAVENOUS
  Filled 2017-11-24 (×9): qty 50

## 2017-11-24 MED ORDER — METHYLPREDNISOLONE 16 MG PO TABS
16.0000 mg | ORAL_TABLET | Freq: Every day | ORAL | Status: AC
  Administered 2017-11-25 – 2017-11-28 (×4): 16 mg via ORAL
  Filled 2017-11-24 (×4): qty 1

## 2017-11-24 MED ORDER — PROMETHAZINE HCL 25 MG/ML IJ SOLN
12.5000 mg | Freq: Four times a day (QID) | INTRAMUSCULAR | Status: DC
  Administered 2017-11-24 – 2017-11-26 (×7): 12.5 mg via INTRAVENOUS
  Filled 2017-11-24 (×7): qty 1

## 2017-11-24 MED ORDER — METHYLPREDNISOLONE 4 MG PO TABS: 4.0000 mg | ORAL_TABLET | Freq: Every day | ORAL | Status: DC

## 2017-11-24 MED ORDER — METHYLPREDNISOLONE 4 MG PO TABS
8.0000 mg | ORAL_TABLET | Freq: Every day | ORAL | Status: DC
  Administered 2017-11-29: 8 mg via ORAL
  Filled 2017-11-24: qty 2

## 2017-11-24 MED ORDER — PRENATAL MULTIVITAMIN CH
1.0000 | ORAL_TABLET | Freq: Every day | ORAL | Status: DC
  Administered 2017-11-28: 1 via ORAL
  Filled 2017-11-24: qty 1

## 2017-11-24 MED ORDER — DEXTROSE IN LACTATED RINGERS 5 % IV SOLN
INTRAVENOUS | Status: DC
  Administered 2017-11-24 – 2017-11-28 (×10): via INTRAVENOUS

## 2017-11-24 MED ORDER — DOCUSATE SODIUM 100 MG PO CAPS
100.0000 mg | ORAL_CAPSULE | Freq: Every day | ORAL | Status: DC
  Administered 2017-11-28 – 2017-11-29 (×2): 100 mg via ORAL
  Filled 2017-11-24 (×2): qty 1

## 2017-11-24 MED ORDER — METHYLPREDNISOLONE SODIUM SUCC 125 MG IJ SOLR
48.0000 mg | Freq: Once | INTRAMUSCULAR | Status: AC
  Administered 2017-11-24: 48 mg via INTRAVENOUS
  Filled 2017-11-24: qty 0.77

## 2017-11-24 MED ORDER — METHYLPREDNISOLONE 4 MG PO TABS
8.0000 mg | ORAL_TABLET | Freq: Every day | ORAL | Status: DC
  Administered 2017-11-27 – 2017-11-28 (×2): 8 mg via ORAL
  Filled 2017-11-24 (×4): qty 2

## 2017-11-24 NOTE — H&P (Signed)
History   22 year old G2 P0-0-1-0 at 18 weeks and 4 days and with hyperemesis of pregnancy.  This is patient's second admission to MAU in 2 days.  She is currently taking Reglan Zofran and Phenergan Protonix and has a Transderm-Scop patch.  She states nothing is helping she throws up everything she takes in.  This is an ongoing problem since the start of the pregnancy.  CSN: [id number]  Arrival date & time 11/24/17  1433   None        Chief Complaint  Patient presents with  . Emesis    HPI      Past Medical History:  Diagnosis Date  . Medical history non-contributory          Past Surgical History:  Procedure Laterality Date  . DILATION AND CURETTAGE OF UTERUS N/A 07/15/2014   Procedure: SUCTION DILATATION AND CURETTAGE;  Surgeon: Lazaro ArmsLuther H Eure, MD;  Location: [facility];  Service: Gynecology;  Laterality: N/A;         Family History  Problem Relation Age of Onset  . Hypertension Maternal Grandmother   . ADD / ADHD Neg Hx   . Alcohol abuse Neg Hx   . Anxiety disorder Neg Hx   . Arthritis Neg Hx   . Birth defects Neg Hx   . Asthma Neg Hx   . Cancer Neg Hx   . COPD Neg Hx   . Depression Neg Hx   . Diabetes Neg Hx   . Drug abuse Neg Hx   . Early death Neg Hx   . Hearing loss Neg Hx   . Heart disease Neg Hx   . Hyperlipidemia Neg Hx   . Intellectual disability Neg Hx   . Kidney disease Neg Hx   . Learning disabilities Neg Hx   . Miscarriages / Stillbirths Neg Hx   . Obesity Neg Hx   . Stroke Neg Hx   . Vision loss Neg Hx   . Varicose Veins Neg Hx     Social History       Tobacco Use  . Smoking status: Never Smoker  . Smokeless tobacco: Never Used  Substance Use Topics  . Alcohol use: No  . Drug use: No    OB History    Gravida  2   Para      Term      Preterm      AB  1   Living  0     SAB  1   TAB      Ectopic      Multiple      Live Births              Review of Systems    Constitutional: Negative.   HENT: Negative.   Eyes: Negative.   Respiratory: Negative.   Gastrointestinal: Positive for nausea and vomiting.  Endocrine: Negative.   Genitourinary: Negative.   Musculoskeletal: Negative.   Skin: Negative.   Allergic/Immunologic: Negative.   Neurological: Negative.   Hematological: Negative.   Psychiatric/Behavioral: Negative.     Allergies  Patient has no known allergies.  Home Medications    BP 116/75 (BP Location: Left Arm)   Pulse 96   Temp 98.3 F (36.8 C) (Oral)   Resp 18   Wt 143 lb 1.3 oz (64.9 kg)   LMP 08/23/2017 (Approximate)   BMI 21.76 kg/m   Physical Exam  Constitutional: She is oriented to person, place, and time. She appears well-developed and  well-nourished.  HENT:  Head: Normocephalic.  Neck: Normal range of motion.  Cardiovascular: Normal rate, regular rhythm, normal heart sounds and intact distal pulses.  Pulmonary/Chest: Effort normal and breath sounds normal.  Abdominal: Soft. Bowel sounds are normal.  Musculoskeletal: Normal range of motion.  Neurological: She is alert and oriented to person, place, and time.  Skin: Skin is warm and dry.  Psychiatric: She has a normal mood and affect. Her behavior is normal. Judgment and thought content normal.    MAU Course  Procedures (including critical care time)  Labs Reviewed  URINALYSIS, ROUTINE W REFLEX MICROSCOPIC  RAPID URINE DRUG SCREEN, HOSP PERFORMED   ImagingResults(Last48hours)  No results found.    Results for orders placed or performed during the hospital encounter of 11/24/17 (from the past 24 hour(s))  Urinalysis, Routine w reflex microscopic     Status: Abnormal   Collection Time: 11/24/17  2:43 PM  Result Value Ref Range   Color, Urine AMBER (A) YELLOW   APPearance CLOUDY (A) CLEAR   Specific Gravity, Urine 1.024 1.005 - 1.030   pH 6.0 5.0 - 8.0   Glucose, UA NEGATIVE NEGATIVE mg/dL   Hgb urine dipstick NEGATIVE NEGATIVE    Bilirubin Urine NEGATIVE NEGATIVE   Ketones, ur 80 (A) NEGATIVE mg/dL   Protein, ur 045 (A) NEGATIVE mg/dL   Nitrite NEGATIVE NEGATIVE   Leukocytes, UA NEGATIVE NEGATIVE   RBC / HPF 0-5 0 - 5 RBC/hpf   WBC, UA 21-50 0 - 5 WBC/hpf   Bacteria, UA MANY (A) NONE SEEN   Squamous Epithelial / LPF 11-20 0 - 5   Mucus PRESENT   Urine rapid drug screen (hosp performed)     Status: Abnormal   Collection Time: 11/24/17  2:43 PM  Result Value Ref Range   Opiates NONE DETECTED NONE DETECTED   Cocaine NONE DETECTED NONE DETECTED   Benzodiazepines NONE DETECTED NONE DETECTED   Amphetamines NONE DETECTED NONE DETECTED   Tetrahydrocannabinol NONE DETECTED NONE DETECTED   Barbiturates POSITIVE (A) NONE DETECTED    1. Hyperemesis       MDM  Vital signs are stable.  Patient unresponsive to oral treatments.  Urine 80 ketones.  Will IV hydrate and discuss plan of care with Dr. Alysia Penna. To admit to ante.

## 2017-11-24 NOTE — MAU Provider Note (Signed)
History   22 year old G2 P0-0-1-0 at 18 weeks and 4 days and with hyperemesis of pregnancy.  This is patient's second admission to MAU in 2 days.  She is currently taking Reglan Zofran and Phenergan Protonix and has a Transderm-Scop patch.  She states nothing is helping she throws up everything she takes in.  This is an ongoing problem since the start of the pregnancy.  CSN: [id number]  Arrival date & time 11/24/17  1433   None     Chief Complaint  Patient presents with  . Emesis    HPI  Past Medical History:  Diagnosis Date  . Medical history non-contributory     Past Surgical History:  Procedure Laterality Date  . DILATION AND CURETTAGE OF UTERUS N/A 07/15/2014   Procedure: SUCTION DILATATION AND CURETTAGE;  Surgeon: Lazaro Arms, MD;  Location: [facility];  Service: Gynecology;  Laterality: N/A;    Family History  Problem Relation Age of Onset  . Hypertension Maternal Grandmother   . ADD / ADHD Neg Hx   . Alcohol abuse Neg Hx   . Anxiety disorder Neg Hx   . Arthritis Neg Hx   . Birth defects Neg Hx   . Asthma Neg Hx   . Cancer Neg Hx   . COPD Neg Hx   . Depression Neg Hx   . Diabetes Neg Hx   . Drug abuse Neg Hx   . Early death Neg Hx   . Hearing loss Neg Hx   . Heart disease Neg Hx   . Hyperlipidemia Neg Hx   . Intellectual disability Neg Hx   . Kidney disease Neg Hx   . Learning disabilities Neg Hx   . Miscarriages / Stillbirths Neg Hx   . Obesity Neg Hx   . Stroke Neg Hx   . Vision loss Neg Hx   . Varicose Veins Neg Hx     Social History   Tobacco Use  . Smoking status: Never Smoker  . Smokeless tobacco: Never Used  Substance Use Topics  . Alcohol use: No  . Drug use: No    OB History    Gravida  2   Para      Term      Preterm      AB  1   Living  0     SAB  1   TAB      Ectopic      Multiple      Live Births              Review of Systems  Constitutional: Negative.   HENT: Negative.   Eyes: Negative.   Respiratory:  Negative.   Gastrointestinal: Positive for nausea and vomiting.  Endocrine: Negative.   Genitourinary: Negative.   Musculoskeletal: Negative.   Skin: Negative.   Allergic/Immunologic: Negative.   Neurological: Negative.   Hematological: Negative.   Psychiatric/Behavioral: Negative.     Allergies  Patient has no known allergies.  Home Medications    BP 116/75 (BP Location: Left Arm)   Pulse 96   Temp 98.3 F (36.8 C) (Oral)   Resp 18   Wt 143 lb 1.3 oz (64.9 kg)   LMP 08/23/2017 (Approximate)   BMI 21.76 kg/m   Physical Exam  Constitutional: She is oriented to person, place, and time. She appears well-developed and well-nourished.  HENT:  Head: Normocephalic.  Neck: Normal range of motion.  Cardiovascular: Normal rate, regular rhythm, normal heart sounds and intact distal pulses.  Pulmonary/Chest: Effort normal and breath sounds normal.  Abdominal: Soft. Bowel sounds are normal.  Musculoskeletal: Normal range of motion.  Neurological: She is alert and oriented to person, place, and time.  Skin: Skin is warm and dry.  Psychiatric: She has a normal mood and affect. Her behavior is normal. Judgment and thought content normal.    MAU Course  Procedures (including critical care time)  Labs Reviewed  URINALYSIS, ROUTINE W REFLEX MICROSCOPIC  RAPID URINE DRUG SCREEN, HOSP PERFORMED   No results found.   1. Hyperemesis       MDM  Vital signs are stable.  Patient unresponsive to oral treatments.  Urine 80 ketones.  Will IV hydrate and discuss plan of care with Dr. Alysia PennaErvin. To admit to ante.

## 2017-11-24 NOTE — MAU Note (Signed)
Ongoing N/V and heartburn. Unable to keep anything down. Reports she took Rx as prescribed and it did not work

## 2017-11-25 DIAGNOSIS — Z3A18 18 weeks gestation of pregnancy: Secondary | ICD-10-CM

## 2017-11-25 DIAGNOSIS — O210 Mild hyperemesis gravidarum: Secondary | ICD-10-CM

## 2017-11-25 LAB — URINE CULTURE

## 2017-11-25 LAB — GLUCOSE, RANDOM: Glucose, Bld: 115 mg/dL — ABNORMAL HIGH (ref 70–99)

## 2017-11-25 MED ORDER — METOCLOPRAMIDE HCL 5 MG/ML IJ SOLN
5.0000 mg | Freq: Four times a day (QID) | INTRAMUSCULAR | Status: DC
  Administered 2017-11-25 – 2017-11-28 (×12): 5 mg via INTRAVENOUS
  Filled 2017-11-25 (×12): qty 2

## 2017-11-25 NOTE — Progress Notes (Signed)
Patient ID: Deborah Evans, female   DOB: 09-23-1995, 22 y.o.   MRN: [id number]   FACULTY PRACTICE ANTEPARTUM(COMPREHENSIVE) NOTE  Deborah Evans is a 22 y.o. G2P0010 at 7122w5d by best clinical estimate who is admitted for hyperemesis gravidarum.   Fetal presentation is unsure. Length of Stay:  1  Days  Subjective: Still nauseated, has not vomited since yesterday Patient reports the fetal movement as not noted yet. Patient reports uterine contraction  activity as none. Patient reports  vaginal bleeding as none. Patient describes fluid per vagina as None.  Vitals:  Blood pressure 112/75, pulse 86, temperature (!) 97.5 F (36.4 C), temperature source Oral, resp. rate 16, height 5\' 8"  (1.727 m), weight 141 lb 2.9 oz (64 kg), last menstrual period 08/23/2017, SpO2 100 %, unknown if currently breastfeeding. Physical Examination:  General appearance - alert, well appearing, and in no distress Heart - normal rate and regular rhythm Abdomen - soft, nontender, nondistended Fundal Height:  size equals dates Membranes:intact  Fetal Monitoring:   Fetal Heart Rate A  Mode Doppler filed at 11/24/2017 2300  Baseline Rate (A) 152 bpm filed      Labs:  Results for orders placed or performed during the hospital encounter of 11/24/17 (from the past 24 hour(s))  Urinalysis, Routine w reflex microscopic   Collection Time: 11/24/17  2:43 PM  Result Value Ref Range   Color, Urine AMBER (A) YELLOW   APPearance CLOUDY (A) CLEAR   Specific Gravity, Urine 1.024 1.005 - 1.030   pH 6.0 5.0 - 8.0   Glucose, UA NEGATIVE NEGATIVE mg/dL   Hgb urine dipstick NEGATIVE NEGATIVE   Bilirubin Urine NEGATIVE NEGATIVE   Ketones, ur 80 (A) NEGATIVE mg/dL   Protein, ur 536100 (A) NEGATIVE mg/dL   Nitrite NEGATIVE NEGATIVE   Leukocytes, UA NEGATIVE NEGATIVE   RBC / HPF 0-5 0 - 5 RBC/hpf   WBC, UA 21-50 0 - 5 WBC/hpf   Bacteria, UA MANY (A) NONE SEEN   Squamous Epithelial / LPF 11-20 0 - 5   Mucus PRESENT   Urine  rapid drug screen (hosp performed)   Collection Time: 11/24/17  2:43 PM  Result Value Ref Range   Opiates NONE DETECTED NONE DETECTED   Cocaine NONE DETECTED NONE DETECTED   Benzodiazepines NONE DETECTED NONE DETECTED   Amphetamines NONE DETECTED NONE DETECTED   Tetrahydrocannabinol NONE DETECTED NONE DETECTED   Barbiturates POSITIVE (A) NONE DETECTED  CBC   Collection Time: 11/24/17  5:39 PM  Result Value Ref Range   WBC 6.9 4.0 - 10.5 K/uL   RBC 3.68 (L) 3.87 - 5.11 MIL/uL   Hemoglobin 10.2 (L) 12.0 - 15.0 g/dL   HCT 64.429.1 (L) 03.436.0 - 74.246.0 %   MCV 79.1 78.0 - 100.0 fL   MCH 27.7 26.0 - 34.0 pg   MCHC 35.1 30.0 - 36.0 g/dL   RDW 59.514.2 63.811.5 - 75.615.5 %   Platelets 170 150 - 400 K/uL  Creatinine, serum   Collection Time: 11/24/17  5:39 PM  Result Value Ref Range   Creatinine, Ser 0.49 0.44 - 1.00 mg/dL   GFR calc non Af Amer >60 >60 mL/min   GFR calc Af Amer >60 >60 mL/min  Glucose, fasting - daily while on taper   Collection Time: 11/25/17  5:29 AM  Result Value Ref Range   Glucose, Bld 115 (H) 70 - 99 mg/dL      Medications:  Scheduled . docusate sodium  100 mg Oral Daily  .  enoxaparin (LOVENOX) injection  40 mg Subcutaneous Q24H  . methylPREDNISolone  16 mg Oral Q breakfast   Followed by  . [START ON 11/29/2017] methylPREDNISolone  8 mg Oral Q breakfast   Followed by  . [START ON 12/06/2017] methylPREDNISolone  4 mg Oral Q breakfast  . methylPREDNISolone  16 mg Oral Q1400   Followed by  . [START ON 11/27/2017] methylPREDNISolone  8 mg Oral Q1400   Followed by  . [START ON 11/30/2017] methylPREDNISolone  4 mg Oral Q1400  . methylPREDNISolone  16 mg Oral QHS   Followed by  . [START ON 11/28/2017] methylPREDNISolone  8 mg Oral QHS   Followed by  . [START ON 12/01/2017] methylPREDNISolone  4 mg Oral QHS  . metoCLOPramide (REGLAN) injection  5 mg Intravenous Q6H  . prenatal multivitamin  1 tablet Oral Q1200  . promethazine  12.5 mg Intravenous Q6H   I have reviewed the patient's  current medications.  ASSESSMENT: Patient Active Problem List   Diagnosis Date Noted  . Hyperemesis 11/24/2017  . Supervision of normal pregnancy 11/08/2017    PLAN: Add Reglan for nausea  Scheryl Darter 11/25/2017,10:44 AM

## 2017-11-26 DIAGNOSIS — O210 Mild hyperemesis gravidarum: Secondary | ICD-10-CM

## 2017-11-26 DIAGNOSIS — Z3A18 18 weeks gestation of pregnancy: Secondary | ICD-10-CM

## 2017-11-26 LAB — GLUCOSE, CAPILLARY: GLUCOSE-CAPILLARY: 114 mg/dL — AB (ref 70–99)

## 2017-11-26 LAB — GLUCOSE, RANDOM: GLUCOSE: 112 mg/dL — AB (ref 70–99)

## 2017-11-26 MED ORDER — ONDANSETRON HCL 4 MG/2ML IJ SOLN
4.0000 mg | Freq: Four times a day (QID) | INTRAMUSCULAR | Status: DC | PRN
  Administered 2017-11-26 – 2017-11-27 (×4): 4 mg via INTRAVENOUS
  Filled 2017-11-26 (×4): qty 2

## 2017-11-26 MED ORDER — PROCHLORPERAZINE EDISYLATE 10 MG/2ML IJ SOLN
10.0000 mg | Freq: Four times a day (QID) | INTRAMUSCULAR | Status: DC
  Administered 2017-11-26 – 2017-11-28 (×8): 10 mg via INTRAVENOUS
  Filled 2017-11-26 (×9): qty 2

## 2017-11-26 MED ORDER — PROCHLORPERAZINE EDISYLATE 10 MG/2ML IJ SOLN: 25.0000 mg | Freq: Four times a day (QID) | INTRAMUSCULAR | Status: DC

## 2017-11-26 NOTE — Progress Notes (Signed)
Patient ID: Deborah Evans, female   DOB: October 14, 1995, 22 y.o.   MRN: [id number]  FACULTY PRACTICE ANTEPARTUM NOTE  Deborah Evans is a 22 y.o. G2P0010 at 3222w6d  who is admitted for hyperemesis.   Fetal presentation is unsure. Length of Stay:  2  Days  Subjective: Tolerated some diet yesterday, but began vomiting again last night. Currently not tolerating PO.  Patient hasn't begun to feel fetal movement.   She reports no uterine contractions She reports no bleeding  She reports no loss of fluid per vagina.  Vitals:  Blood pressure 129/86, pulse 86, temperature 98.5 F (36.9 C), temperature source Oral, resp. rate 18, height 5\' 8"  (1.727 m), weight 147 lb (66.7 kg), last menstrual period 08/23/2017, SpO2 100 %, unknown if currently breastfeeding. Physical Examination:  General appearance - alert, well appearing, and in no distress Chest - clear to auscultation, no wheezes, rales or rhonchi, symmetric air entry Heart - normal rate, regular rhythm, normal S1, S2, no murmurs, rubs, clicks or gallops Abdomen - soft, mild diffuse abdominal tenderness, nondistended, no masses or organomegaly Fundal Height:  size equals dates Extremities: extremities normal, atraumatic, no cyanosis or edema, Homans sign is negative, no sign of DVT and no edema, redness or tenderness in the calves or thighs  Membranes:intact  Fetal Monitoring:  n/a  Labs:  Results for orders placed or performed during the hospital encounter of 11/24/17 (from the past 24 hour(s))  Glucose, capillary   Collection Time: 11/26/17  6:27 AM  Result Value Ref Range   Glucose-Capillary 114 (H) 70 - 99 mg/dL   Comment 1 Notify RN   Glucose, fasting - daily while on taper   Collection Time: 11/26/17  6:49 AM  Result Value Ref Range   Glucose, Bld 112 (H) 70 - 99 mg/dL    Imaging Studies:       Medications:  Scheduled . docusate sodium  100 mg Oral Daily  . enoxaparin (LOVENOX) injection  40 mg Subcutaneous Q24H  .  methylPREDNISolone  16 mg Oral Q breakfast   Followed by  . [START ON 11/29/2017] methylPREDNISolone  8 mg Oral Q breakfast   Followed by  . [START ON 12/06/2017] methylPREDNISolone  4 mg Oral Q breakfast  . methylPREDNISolone  16 mg Oral Q1400   Followed by  . [START ON 11/27/2017] methylPREDNISolone  8 mg Oral Q1400   Followed by  . [START ON 11/30/2017] methylPREDNISolone  4 mg Oral Q1400  . methylPREDNISolone  16 mg Oral QHS   Followed by  . [START ON 11/28/2017] methylPREDNISolone  8 mg Oral QHS   Followed by  . [START ON 12/01/2017] methylPREDNISolone  4 mg Oral QHS  . metoCLOPramide (REGLAN) injection  5 mg Intravenous Q6H  . prenatal multivitamin  1 tablet Oral Q1200  . promethazine  12.5 mg Intravenous Q6H   I have reviewed the patient's current medications.  ASSESSMENT: Active Problems:   Hyperemesis   [redacted] weeks gestation of pregnancy   PLAN: 1. Hyperemesis Gravidum  On steroid taper  On Scheduled phenergan  On reglan  Will add zofran.  If still not tolerating diet, may change phenergan to compazine  Will consider NG tube  Continue routine antenatal care.   Levie HeritageStinson, Coda Mathey J, DO 11/26/2017,9:27 AM

## 2017-11-27 LAB — GLUCOSE, CAPILLARY: Glucose-Capillary: 101 mg/dL — ABNORMAL HIGH (ref 70–99)

## 2017-11-27 LAB — BASIC METABOLIC PANEL
ANION GAP: 10 (ref 5–15)
BUN: <5 mg/dL — ABNORMAL LOW (ref 6–20)
CALCIUM: 8.2 mg/dL — AB (ref 8.9–10.3)
CHLORIDE: 98 mmol/L (ref 98–111)
CO2: 23 mmol/L (ref 22–32)
CREATININE: 0.42 mg/dL — AB (ref 0.44–1.00)
GFR calc Af Amer: >60 mL/min (ref >60)
GFR calc non Af Amer: >60 mL/min (ref >60)
GLUCOSE: 102 mg/dL — AB (ref 70–99)
Potassium: 2.3 mmol/L — CL (ref 3.5–5.1)
Sodium: 131 mmol/L — ABNORMAL LOW (ref 135–145)

## 2017-11-27 LAB — CBC
HEMATOCRIT: 28.8 % — AB (ref 36.0–46.0)
Hemoglobin: 10.1 g/dL — ABNORMAL LOW (ref 12.0–15.0)
MCH: 27.7 pg (ref 26.0–34.0)
MCHC: 35.1 g/dL (ref 30.0–36.0)
MCV: 78.9 fL (ref 78.0–100.0)
Platelets: 160 10*3/uL (ref 150–400)
RBC: 3.65 MIL/uL — ABNORMAL LOW (ref 3.87–5.11)
RDW: 14.3 % (ref 11.5–15.5)
WBC: 6.7 10*3/uL (ref 4.0–10.5)

## 2017-11-27 LAB — GLUCOSE, RANDOM: Glucose, Bld: 94 mg/dL (ref 70–99)

## 2017-11-27 LAB — TSH: TSH: 1.277 u[IU]/mL (ref 0.350–4.500)

## 2017-11-27 LAB — MAGNESIUM: Magnesium: 1.5 mg/dL — ABNORMAL LOW (ref 1.7–2.4)

## 2017-11-27 MED ORDER — POTASSIUM CHLORIDE 10 MEQ/100ML IV SOLN
10.0000 meq | INTRAVENOUS | Status: AC
  Administered 2017-11-27 (×6): 10 meq via INTRAVENOUS
  Filled 2017-11-27 (×6): qty 100

## 2017-11-27 MED ORDER — MECLIZINE HCL 25 MG PO TABS
25.0000 mg | ORAL_TABLET | Freq: Three times a day (TID) | ORAL | Status: DC
  Administered 2017-11-27 – 2017-11-29 (×7): 25 mg via ORAL
  Filled 2017-11-27 (×7): qty 1

## 2017-11-27 MED ORDER — MAGNESIUM SULFATE 50 % IJ SOLN
2.0000 g | Freq: Once | INTRAVENOUS | Status: AC
  Administered 2017-11-27: 2 g via INTRAVENOUS
  Filled 2017-11-27: qty 4

## 2017-11-27 MED ORDER — MAGNESIUM SULFATE 2 GM/50ML IV SOLN
2.0000 g | Freq: Once | INTRAVENOUS | Status: DC
  Filled 2017-11-27: qty 50

## 2017-11-27 NOTE — Progress Notes (Signed)
Patient ID: Deborah Evans, female   DOB: 01/22/1996, 22 y.o.   MRN: [id number]  FACULTY PRACTICE ANTEPARTUM NOTE  Deborah Quamyana S Hester is a 22 y.o. G2P0010 at 5921w6d  who is admitted for hyperemesis.   Fetal presentation is unsure. Length of Stay:  3  Days  Subjective: Less vomiting, but still severely nauseated.  Patient hasn't begun to feel fetal movement.   She reports no uterine contractions She reports no bleeding  She reports no loss of fluid per vagina.  Vitals:  Blood pressure 130/83, pulse 91, temperature 99.1 F (37.3 C), temperature source Oral, resp. rate 18, height 5\' 8"  (1.727 m), weight 147 lb 0.8 oz (66.7 kg), last menstrual period 08/23/2017, SpO2 99 %, unknown if currently breastfeeding. Physical Examination:  General appearance - alert, well appearing, and in no distress Chest - clear to auscultation, no wheezes, rales or rhonchi, symmetric air entry Heart - normal rate, regular rhythm, normal S1, S2, no murmurs, rubs, clicks or gallops Abdomen - soft, mild diffuse abdominal tenderness, nondistended, no masses or organomegaly Fundal Height:  size equals dates Extremities: extremities normal, atraumatic, no cyanosis or edema, Homans sign is negative, no sign of DVT and no edema, redness or tenderness in the calves or thighs  Membranes:intact  Fetal Monitoring:  n/a  Labs:  Results for orders placed or performed during the hospital encounter of 11/24/17 (from the past 24 hour(s))  Glucose, fasting - daily while on taper   Collection Time: 11/27/17  5:35 AM  Result Value Ref Range   Glucose, Bld 94 70 - 99 mg/dL  Glucose, capillary   Collection Time: 11/27/17  5:40 AM  Result Value Ref Range   Glucose-Capillary 101 (H) 70 - 99 mg/dL   Comment 1 Notify RN     Imaging Studies:       Medications:  Scheduled . docusate sodium  100 mg Oral Daily  . enoxaparin (LOVENOX) injection  40 mg Subcutaneous Q24H  . meclizine  25 mg Oral TID  . methylPREDNISolone  16 mg  Oral Q breakfast   Followed by  . [START ON 11/29/2017] methylPREDNISolone  8 mg Oral Q breakfast   Followed by  . [START ON 12/06/2017] methylPREDNISolone  4 mg Oral Q breakfast  . methylPREDNISolone  16 mg Oral QHS   Followed by  . [START ON 11/28/2017] methylPREDNISolone  8 mg Oral QHS   Followed by  . [START ON 12/01/2017] methylPREDNISolone  4 mg Oral QHS  . methylPREDNISolone  8 mg Oral Q1400   Followed by  . [START ON 11/30/2017] methylPREDNISolone  4 mg Oral Q1400  . metoCLOPramide (REGLAN) injection  5 mg Intravenous Q6H  . prenatal multivitamin  1 tablet Oral Q1200  . prochlorperazine  10 mg Intravenous Q6H   I have reviewed the patient's current medications.  ASSESSMENT: Active Problems:   Hyperemesis   [redacted] weeks gestation of pregnancy   PLAN: 1. Hyperemesis Gravidum  On steroid taper  On Scheduled compazine  On scheduled reglan  Zofran added yesterday, but not very effective.  Add meclazine  Will consider NG tube  Check CBC, BMP, TSH  Continue routine antenatal care.   Levie HeritageStinson, Meesha Sek J, DO 11/27/2017,9:33 AM

## 2017-11-28 DIAGNOSIS — R1110 Vomiting, unspecified: Secondary | ICD-10-CM

## 2017-11-28 DIAGNOSIS — E876 Hypokalemia: Secondary | ICD-10-CM | POA: Diagnosis not present

## 2017-11-28 LAB — GLUCOSE, CAPILLARY: GLUCOSE-CAPILLARY: 117 mg/dL — AB (ref 70–99)

## 2017-11-28 LAB — MAGNESIUM: MAGNESIUM: 2.2 mg/dL (ref 1.7–2.4)

## 2017-11-28 MED ORDER — ONDANSETRON HCL 4 MG/2ML IJ SOLN: 4.0000 mg | Freq: Four times a day (QID) | INTRAMUSCULAR | Status: DC | PRN

## 2017-11-28 MED ORDER — FAMOTIDINE 20 MG PO TABS
20.0000 mg | ORAL_TABLET | Freq: Two times a day (BID) | ORAL | Status: DC
  Administered 2017-11-28 – 2017-11-29 (×2): 20 mg via ORAL
  Filled 2017-11-28 (×2): qty 1

## 2017-11-28 MED ORDER — PROCHLORPERAZINE MALEATE 10 MG PO TABS
10.0000 mg | ORAL_TABLET | Freq: Four times a day (QID) | ORAL | Status: DC | PRN
Start: 2017-11-28 — End: 2017-11-29
  Filled 2017-11-28: qty 1

## 2017-11-28 MED ORDER — POTASSIUM CHLORIDE 10 MEQ/100ML IV SOLN
10.0000 meq | INTRAVENOUS | Status: AC
  Administered 2017-11-28 (×5): 10 meq via INTRAVENOUS
  Filled 2017-11-28 (×5): qty 100

## 2017-11-28 MED ORDER — ENSURE ENLIVE PO LIQD
237.0000 mL | Freq: Two times a day (BID) | ORAL | Status: DC
  Administered 2017-11-28 – 2017-11-29 (×2): 237 mL via ORAL
  Filled 2017-11-28 (×3): qty 237

## 2017-11-28 MED ORDER — METOCLOPRAMIDE HCL 10 MG PO TABS
5.0000 mg | ORAL_TABLET | Freq: Four times a day (QID) | ORAL | Status: DC
  Administered 2017-11-28 – 2017-11-29 (×4): 5 mg via ORAL
  Filled 2017-11-28 (×4): qty 1

## 2017-11-28 NOTE — Progress Notes (Signed)
Patient ID: Deborah Evans, female   DOB: [birth year], 22 y.o.   MRN: [id number]  FACULTY PRACTICE ANTEPARTUM NOTE  Deborah Evans is a 22 y.o. G2P0010 at [redacted]w[redacted]d  who is admitted for hyperemesis.   Fetal presentation is unsure. Length of Stay:  4  Days  Subjective: Feeling better today - tolerating some sips and some grapes.  Patient reports good fetal movement.   She reports no uterine contractions She reports no bleeding  She reports no loss of fluid per vagina.  Vitals:  Blood pressure 119/83, pulse 96, temperature 98 F (36.7 C), temperature source Oral, resp. rate 18, height 5\' 8"  (1.727 m), weight 146 lb 12 oz (66.6 kg), last menstrual period 08/23/2017, SpO2 99 %, unknown if currently breastfeeding. Physical Examination:  General appearance - alert, well appearing, and in no distress Chest - clear to auscultation, no wheezes, rales or rhonchi, symmetric air entry Heart - normal rate, regular rhythm, normal S1, S2, no murmurs, rubs, clicks or gallops Abdomen - soft, nontender, nondistended, no masses or organomegaly Extremities: extremities normal, atraumatic, no cyanosis or edema and Homans sign is negative, no sign of DVT  Membranes:intact  Fetal Monitoring:  n/a  Labs:  Results for orders placed or performed during the hospital encounter of 11/24/17 (from the past 24 hour(s))  Glucose, capillary   Collection Time: 11/28/17  5:03 AM  Result Value Ref Range   Glucose-Capillary 117 (H) 70 - 99 mg/dL   Comment 1 Notify RN   Basic metabolic panel   Collection Time: 11/28/17  5:21 AM  Result Value Ref Range   Sodium 134 (L) 135 - 145 mmol/L   Potassium 2.9 (L) 3.5 - 5.1 mmol/L   Chloride 102 98 - 111 mmol/L   CO2 24 22 - 32 mmol/L   Glucose, Bld 110 (H) 70 - 99 mg/dL   BUN <5 (L) 6 - 20 mg/dL   Creatinine, Ser 4.09 (L) 0.44 - 1.00 mg/dL   Calcium 8.4 (L) 8.9 - 10.3 mg/dL   GFR calc non Af Amer >60 >60 mL/min   GFR calc Af Amer >60 >60 mL/min   Anion gap 8 5 - 15   Magnesium   Collection Time: 11/28/17  5:21 AM  Result Value Ref Range   Magnesium 2.2 1.7 - 2.4 mg/dL    Imaging Studies:       Medications:  Scheduled . docusate sodium  100 mg Oral Daily  . enoxaparin (LOVENOX) injection  40 mg Subcutaneous Q24H  . meclizine  25 mg Oral TID  . [START ON 11/29/2017] methylPREDNISolone  8 mg Oral Q breakfast   Followed by  . [START ON 12/06/2017] methylPREDNISolone  4 mg Oral Q breakfast  . methylPREDNISolone  8 mg Oral Q1400   Followed by  . [START ON 11/30/2017] methylPREDNISolone  4 mg Oral Q1400  . methylPREDNISolone  8 mg Oral QHS   Followed by  . [START ON 12/01/2017] methylPREDNISolone  4 mg Oral QHS  . metoCLOPramide (REGLAN) injection  5 mg Intravenous Q6H  . prenatal multivitamin  1 tablet Oral Q1200  . prochlorperazine  10 mg Intravenous Q6H   I have reviewed the patient's current medications.  ASSESSMENT: Active Problems:   Hyperemesis   [redacted] weeks gestation of pregnancy   Hypomagnesemia   Hypokalemia due to loss of potassium   PLAN: 1. Hyperemesis Gravidum  On steroid taper  On Scheduled compazine  On scheduled reglan  Will change IV antiemetics to PO.  Continue meclazine  PO  Will consider NG tube  2. Hypokalemia  K+ 2.3 to 2.9 today after 6 runs  Will give 5 additional runs today.  Recheck potassium in AM 3. Hpomagnesemia  Replaced.  Continue routine antenatal care.   Levie HeritageStinson, Kc Summerson J, DO 11/28/2017,10:28 AM

## 2017-11-28 NOTE — Progress Notes (Addendum)
Initial Nutrition Assessment  DOCUMENTATION CODES:  Not applicable  INTERVENTION:  Regular diet - encouraged low fat, smal freq meals Ensure enlive BID Snacks between meals  NUTRITION DIAGNOSIS:  Inadequate oral intake related to nausea, vomiting as evidenced by energy intake < or equal to 50% for > or equal to 5 days, percent weight loss.   GOAL:  Patient will meet greater than or equal to 90% of their needs, Weight gain  MONITOR:  Supplement acceptance, Weight trends  REASON FOR ASSESSMENT:  Antenatal(hyperemesis)   ASSESSMENT:   19 weeks/ hyperemesis. Reports pre-preg weight of 169 lbs. N/V since early preg which resolved at end of first trimester for 2-3 weeks and promoted weight gain. Hyperemesis returned about 2 weeks ago and has had an acute weight loss of 13 lbs. Weight is now at 13.6 % of usual    Discussed consumption of small freq low fat meals, foods with ginger that may reduce nausea, foods to choose on menu while adm, Ensure was tol at home PTA   Given significance of weight loss this puts pt at high risk for malnutrtion  Diet Order:   Diet Order           Diet regular Room service appropriate? Yes; Fluid consistency: Thin  Diet effective now          EDUCATION NEEDS:  No education needs have been identified at this time  Skin:  Skin Assessment: Reviewed RN Assessment   Height:   Ht Readings from Last 1 Encounters:  11/24/17 5\' 8"  (1.727 m)    Weight:   Wt Readings from Last 1 Encounters:  11/28/17 146 lb 12 oz (66.6 kg)    Ideal Body Weight:   140 lbs   BMI:  Body mass index is 22.31 kg/m.  Estimated Nutritional Needs:   Kcal:  1900-2100  Protein:  85-95 g  Fluid:  2.2 L    Elisabeth CaraKatherine Anterio Scheel M.Odis LusterEd. R.D. LDN Neonatal Nutrition Support Specialist/RD III Pager [phone]      Phone [phone]

## 2017-11-29 ENCOUNTER — Encounter: Payer: BLUE CROSS/BLUE SHIELD | Admitting: Advanced Practice Midwife

## 2017-11-29 LAB — BASIC METABOLIC PANEL
ANION GAP: 8 (ref 5–15)
Anion gap: 8 (ref 5–15)
BUN: <5 mg/dL — ABNORMAL LOW (ref 6–20)
BUN: <5 mg/dL — ABNORMAL LOW (ref 6–20)
CALCIUM: 8.4 mg/dL — AB (ref 8.9–10.3)
CHLORIDE: 102 mmol/L (ref 98–111)
CO2: 24 mmol/L (ref 22–32)
CO2: 24 mmol/L (ref 22–32)
CREATININE: 0.4 mg/dL — AB (ref 0.44–1.00)
Calcium: 8.4 mg/dL — ABNORMAL LOW (ref 8.9–10.3)
Chloride: 102 mmol/L (ref 98–111)
Creatinine, Ser: 0.46 mg/dL (ref 0.44–1.00)
GFR calc Af Amer: >60 mL/min (ref >60)
GFR calc non Af Amer: >60 mL/min (ref >60)
GLUCOSE: 96 mg/dL (ref 70–99)
Glucose, Bld: 110 mg/dL — ABNORMAL HIGH (ref 70–99)
POTASSIUM: 3.2 mmol/L — AB (ref 3.5–5.1)
Potassium: 2.9 mmol/L — ABNORMAL LOW (ref 3.5–5.1)
SODIUM: 134 mmol/L — AB (ref 135–145)
Sodium: 134 mmol/L — ABNORMAL LOW (ref 135–145)

## 2017-11-29 MED ORDER — METOCLOPRAMIDE HCL 5 MG PO TABS: 5.0000 mg | ORAL_TABLET | Freq: Four times a day (QID) | ORAL | 3 refills | Status: DC | PRN

## 2017-11-29 MED ORDER — METHYLPREDNISOLONE 4 MG PO TABS: 4.0000 mg | ORAL_TABLET | Freq: Every day | ORAL | 0 refills | Status: DC

## 2017-11-29 MED ORDER — MECLIZINE HCL 25 MG PO TABS: 25.0000 mg | ORAL_TABLET | Freq: Three times a day (TID) | ORAL | 0 refills | Status: DC

## 2017-11-29 MED ORDER — PROCHLORPERAZINE MALEATE 10 MG PO TABS: 10.0000 mg | ORAL_TABLET | Freq: Four times a day (QID) | ORAL | 0 refills | Status: DC | PRN

## 2017-11-29 NOTE — Discharge Summary (Signed)
Physician Discharge Summary  Patient ID: Deborah Evans MRN: [id number] DOB/AGE: 04-Oct-1995 22 y.o.  Admit date: 11/24/2017 Discharge date: 11/29/2017  Admission Diagnoses:  Discharge Diagnoses:  Active Problems:   Hyperemesis   [redacted] weeks gestation of pregnancy   Hypomagnesemia   Hypokalemia due to loss of potassium   Discharged Condition: good  Hospital Course: Patient admitted on 11/24/17 for hyperemesis with a 13 pound weight loss in 2 weeks.  She had been on Zofran and Reglan outpatient, but continued to have vomiting.  She was put on scheduled Reglan IV and scheduled Phenergan IV.  On 7/30, she was found to have hypokalemia and hypomagnesemia.  These were replaced.  Repeat labs showed improvement.  Patient also began to have improvement when meclizine was added.  She is tolerated p.o. fluids and food will be discharged home.  Consults: None  Significant Diagnostic Studies: Potassium of 2.3 which is improved to 3.2.  Treatments: IV hydration and antiemetics  Discharge Exam: Blood pressure 114/78, pulse 93, temperature 98.3 F (36.8 C), temperature source Oral, resp. rate 18, height 5\' 8"  (1.727 m), weight 145 lb 8 oz (66 kg), last menstrual period 08/23/2017, SpO2 97 %, unknown if currently breastfeeding. General appearance: alert, cooperative and no distress Resp: clear to auscultation bilaterally Cardio: regular rate and rhythm, S1, S2 normal, no murmur, click, rub or gallop GI: soft, non-tender; bowel sounds normal; no masses,  no organomegaly Extremities: extremities normal, atraumatic, no cyanosis or edema and Homans sign is negative, no sign of DVT  Disposition: Discharge disposition: 01-Home or Self Care       Discharge Instructions    Discharge activity:  Bathroom / Shower only   Complete by:  As directed    Discharge activity:  No Restrictions   Complete by:  As directed    Discharge activity:  Up to eat   Complete by:  As directed    Discharge activity:  Bedrest   Complete by:  As directed      Allergies as of 11/29/2017   No Known Allergies     Medication List    STOP taking these medications   ondansetron 4 MG tablet Commonly known as:  ZOFRAN   promethazine 25 MG tablet Commonly known as:  PHENERGAN     TAKE these medications   meclizine 25 MG tablet Commonly known as:  ANTIVERT Take 1 tablet (25 mg total) by mouth 3 (three) times daily.   methylPREDNISolone 4 MG tablet Commonly known as:  MEDROL Take 1 tablet (4 mg total) by mouth daily. Refer to table given by inpatient pharmacist   metoCLOPramide 5 MG tablet Commonly known as:  REGLAN Take 1 tablet (5 mg total) by mouth every 6 (six) hours as needed for nausea or vomiting. What changed:    medication strength  how much to take  when to take this  reasons to take this  additional instructions   pantoprazole 20 MG tablet Commonly known as:  PROTONIX 2 tabs daily for heartburn.   prochlorperazine 10 MG tablet Commonly known as:  COMPAZINE Take 1 tablet (10 mg total) by mouth every 6 (six) hours as needed for nausea or vomiting.      Follow-up Information    [facility] Follow up.   Specialty:  Obstetrics and Gynecology Contact information: [address] [place] [id number] [phone]          Signed: Levie HeritageJacob J Auburn Hert 11/29/2017, 9:09 AM

## 2017-11-29 NOTE — Discharge Instructions (Signed)

## 2017-11-29 NOTE — Progress Notes (Signed)
Pharmacy Consult:   MEDROL (METHYLPREDNISOLONE) TAPER  FOR HYPEREMESIS GRAVIDARUM PATIENTS  The following is a 14 day taper of methylprednisolone for hyperemesis. Doses on day 1  will be given IV.  All doses starting on day 2  will be given PO. (If patient cannot tolerate oral medications, contact the pharmacy to change route to IV.)   Date Day Morning Midday Bedtime  11/24/17 1   48 mg  11/25/17 2 16  mg 16 mg 16 mg  11/26/17 3 16  mg 16 mg 16 mg  11/27/17 4 16  mg 8 mg 16 mg  11/28/17 5 16  mg 8 mg 8 mg  11/29/17 6 8  mg 8 mg 8 mg  11/30/17 7 8  mg 4 mg 8 mg  12/01/17 8 8  mg 4 mg 4 mg  12/02/17 9 8  mg 4 mg   12/03/17 10 8  mg 4 mg   12/04/17 11 8  mg    12/05/17 12 8  mg    12/06/17 13 4  mg    12/07/17 14 4  mg     Check fasting blood sugars daily while on the taper. Notify MD if fasting blood sugar>95.  Sherrilyn RistKaren W Eivin Mascio RPh 11/29/2017

## 2017-11-29 NOTE — Progress Notes (Signed)
Pt ambulated out teaching complete  

## 2017-11-30 ENCOUNTER — Ambulatory Visit (INDEPENDENT_AMBULATORY_CARE_PROVIDER_SITE_OTHER): Payer: BLUE CROSS/BLUE SHIELD | Admitting: Women's Health

## 2017-11-30 ENCOUNTER — Encounter: Payer: Self-pay | Admitting: Women's Health

## 2017-11-30 VITALS — BP 113/75 | HR 93 | Wt 146.0 lb

## 2017-11-30 DIAGNOSIS — Z3482 Encounter for supervision of other normal pregnancy, second trimester: Secondary | ICD-10-CM

## 2017-11-30 DIAGNOSIS — Z3A19 19 weeks gestation of pregnancy: Secondary | ICD-10-CM

## 2017-11-30 DIAGNOSIS — Z1389 Encounter for screening for other disorder: Secondary | ICD-10-CM

## 2017-11-30 DIAGNOSIS — Z331 Pregnant state, incidental: Secondary | ICD-10-CM

## 2017-11-30 LAB — POCT URINALYSIS DIPSTICK OB
Blood, UA: NEGATIVE
GLUCOSE, UA: NEGATIVE — AB
KETONES UA: NEGATIVE
Leukocytes, UA: NEGATIVE
Nitrite, UA: NEGATIVE
POC,PROTEIN,UA: NEGATIVE

## 2017-11-30 NOTE — Patient Instructions (Signed)
Deborah Evans, I greatly value your feedback.  If you receive a survey following your visit with us today, we appreciate you taking the time to fill it out.  Thanks, Joellyn HaffKim Yuval Nolet, CNM, WHNP-BC   Second Trimester of Pregnancy The second trimester is from week 14 through week 27 (months 4 through 6). The second trimester is often a time when you feel your best. Your body has adjusted to being pregnant, and you begin to feel better physically. Usually, morning sickness has lessened or quit completely, you may have more energy, and you may have an increase in appetite. The second trimester is also a time when the fetus is growing rapidly. At the end of the sixth month, the fetus is about 9 inches long and weighs about 1 pounds. You will likely begin to feel the baby move (quickening) between 16 and 20 weeks of pregnancy. Body changes during your second trimester Your body continues to go through many changes during your second trimester. The changes vary from woman to woman.  Your weight will continue to increase. You will notice your lower abdomen bulging out.  You may begin to get stretch marks on your hips, abdomen, and breasts.  You may develop headaches that can be relieved by medicines. The medicines should be approved by your health care provider.  You may urinate more often because the fetus is pressing on your bladder.  You may develop or continue to have heartburn as a result of your pregnancy.  You may develop constipation because certain hormones are causing the muscles that push waste through your intestines to slow down.  You may develop hemorrhoids or swollen, bulging veins (varicose veins).  You may have back pain. This is caused by: ? Weight gain. ? Pregnancy hormones that are relaxing the joints in your pelvis. ? A shift in weight and the muscles that support your balance.  Your breasts will continue to grow and they will continue to become tender.  Your gums may bleed  and may be sensitive to brushing and flossing.  Dark spots or blotches (chloasma, mask of pregnancy) may develop on your face. This will likely fade after the baby is born.  A dark line from your belly button to the pubic area (linea nigra) may appear. This will likely fade after the baby is born.  You may have changes in your hair. These can include thickening of your hair, rapid growth, and changes in texture. Some women also have hair loss during or after pregnancy, or hair that feels dry or thin. Your hair will most likely return to normal after your baby is born.  What to expect at prenatal visits During a routine prenatal visit:  You will be weighed to make sure you and the fetus are growing normally.  Your blood pressure will be taken.  Your abdomen will be measured to track your baby's growth.  The fetal heartbeat will be listened to.  Any test results from the previous visit will be discussed.  Your health care provider may ask you:  How you are feeling.  If you are feeling the baby move.  If you have had any abnormal symptoms, such as leaking fluid, bleeding, severe headaches, or abdominal cramping.  If you are using any tobacco products, including cigarettes, chewing tobacco, and electronic cigarettes.  If you have any questions.  Other tests that may be performed during your second trimester include:  Blood tests that check for: ? Low iron levels (anemia). ? High  blood sugar that affects pregnant women (gestational diabetes) between 3 and 28 weeks. ? Rh antibodies. This is to check for a protein on red blood cells (Rh factor).  Urine tests to check for infections, diabetes, or protein in the urine.  An ultrasound to confirm the proper growth and development of the baby.  An amniocentesis to check for possible genetic problems.  Fetal screens for spina bifida and Down syndrome.  HIV (human immunodeficiency virus) testing. Routine prenatal testing includes  screening for HIV, unless you choose not to have this test.  Follow these instructions at home: Medicines  Follow your health care provider's instructions regarding medicine use. Specific medicines may be either safe or unsafe to take during pregnancy.  Take a prenatal vitamin that contains at least 600 micrograms (mcg) of folic acid.  If you develop constipation, try taking a stool softener if your health care provider approves. Eating and drinking  Eat a balanced diet that includes fresh fruits and vegetables, whole grains, good sources of protein such as meat, eggs, or tofu, and low-fat dairy. Your health care provider will help you determine the amount of weight gain that is right for you.  Avoid raw meat and uncooked cheese. These carry germs that can cause birth defects in the baby.  If you have low calcium intake from food, talk to your health care provider about whether you should take a daily calcium supplement.  Limit foods that are high in fat and processed sugars, such as fried and sweet foods.  To prevent constipation: ? Drink enough fluid to keep your urine clear or pale yellow. ? Eat foods that are high in fiber, such as fresh fruits and vegetables, whole grains, and beans. Activity  Exercise only as directed by your health care provider. Most women can continue their usual exercise routine during pregnancy. Try to exercise for 30 minutes at least 5 days a week. Stop exercising if you experience uterine contractions.  Avoid heavy lifting, wear low heel shoes, and practice good posture.  A sexual relationship may be continued unless your health care provider directs you otherwise. Relieving pain and discomfort  Wear a good support bra to prevent discomfort from breast tenderness.  Take warm sitz baths to soothe any pain or discomfort caused by hemorrhoids. Use hemorrhoid cream if your health care provider approves.  Rest with your legs elevated if you have leg cramps  or low back pain.  If you develop varicose veins, wear support hose. Elevate your feet for 15 minutes, 3-4 times a day. Limit salt in your diet. Prenatal Care  Write down your questions. Take them to your prenatal visits.  Keep all your prenatal visits as told by your health care provider. This is important. Safety  Wear your seat belt at all times when driving.  Make a list of emergency phone numbers, including numbers for family, friends, the hospital, and police and fire departments. General instructions  Ask your health care provider for a referral to a local prenatal education class. Begin classes no later than the beginning of month 6 of your pregnancy.  Ask for help if you have counseling or nutritional needs during pregnancy. Your health care provider can offer advice or refer you to specialists for help with various needs.  Do not use hot tubs, steam rooms, or saunas.  Do not douche or use tampons or scented sanitary pads.  Do not cross your legs for long periods of time.  Avoid cat litter boxes and soil  used by cats. These carry germs that can cause birth defects in the baby and possibly loss of the fetus by miscarriage or stillbirth.  Avoid all smoking, herbs, alcohol, and unprescribed drugs. Chemicals in these products can affect the formation and growth of the baby.  Do not use any products that contain nicotine or tobacco, such as cigarettes and e-cigarettes. If you need help quitting, ask your health care provider.  Visit your dentist if you have not gone yet during your pregnancy. Use a soft toothbrush to brush your teeth and be gentle when you floss. Contact a health care provider if:  You have dizziness.  You have mild pelvic cramps, pelvic pressure, or nagging pain in the abdominal area.  You have persistent nausea, vomiting, or diarrhea.  You have a bad smelling vaginal discharge.  You have pain when you urinate. Get help right away if:  You have a  fever.  You are leaking fluid from your vagina.  You have spotting or bleeding from your vagina.  You have severe abdominal cramping or pain.  You have rapid weight gain or weight loss.  You have shortness of breath with chest pain.  You notice sudden or extreme swelling of your face, hands, ankles, feet, or legs.  You have not felt your baby move in over an hour.  You have severe headaches that do not go away when you take medicine.  You have vision changes. Summary  The second trimester is from week 14 through week 27 (months 4 through 6). It is also a time when the fetus is growing rapidly.  Your body goes through many changes during pregnancy. The changes vary from woman to woman.  Avoid all smoking, herbs, alcohol, and unprescribed drugs. These chemicals affect the formation and growth your baby.  Do not use any tobacco products, such as cigarettes, chewing tobacco, and e-cigarettes. If you need help quitting, ask your health care provider.  Contact your health care provider if you have any questions. Keep all prenatal visits as told by your health care provider. This is important. This information is not intended to replace advice given to you by your health care provider. Make sure you discuss any questions you have with your health care provider. Document Released: 04/11/2001 Document Revised: 09/23/2015 Document Reviewed: 06/18/2012 Elsevier Interactive Patient Education  2017 [employer].

## 2017-11-30 NOTE — Progress Notes (Signed)
   LOW-RISK PREGNANCY VISIT Patient name: Deborah Evans MRN [id number]  Date of birth: 1995/06/06 Chief Complaint:   Routine Prenatal Visit  History of Present Illness:   Deborah Evans is a 22 y.o. 522P0010 female at 8741w3d with an Estimated Date of Delivery: 04/23/18 being seen today for ongoing management of a low-risk pregnancy.  Today she reports no complaints. [facility] hype[facility]. Has not vomited since d/c, eating well, says she has to stay away from oily foods b/c they make her sick. Has gained 2lbs since last visit!  Contractions: Not present. Vag. Bleeding: None.  Movement: Absent. denies leaking of fluid. Review of Systems:   Pertinent items are noted in HPI Denies abnormal vaginal discharge w/ itching/odor/irritation, headaches, visual changes, shortness of breath, chest pain, abdominal pain, severe nausea/vomiting, or problems with urination or bowel movements unless otherwise stated above. Pertinent History Reviewed:  Reviewed past medical,surgical, social, obstetrical and family history.  Reviewed problem list, medications and allergies. Physical Assessment:   Vitals:   11/30/17 1015  BP: 113/75  Pulse: 93  Weight: 146 lb (66.2 kg)  Body mass index is 22.2 kg/m.        Physical Examination:   General appearance: Well appearing, and in no distress  Mental status: Alert, oriented to person, place, and time  Skin: Warm & dry  Cardiovascular: Normal heart rate noted  Respiratory: Normal respiratory effort, no distress  Abdomen: Soft, gravid, nontender  Pelvic: Cervical exam deferred         Extremities: Edema: None  Fetal Status: Fetal Heart Rate (bpm): 155   Movement: Absent    Results for orders placed or performed in visit on 11/30/17 (from the past 24 hour(s))  POC Urinalysis Dipstick OB   Collection Time: 11/30/17 10:18 AM  Result Value Ref Range   Color, UA     Clarity, UA     Glucose, UA Negative (A) (none)   Bilirubin,  UA     Ketones, UA neg    Spec Grav, UA  1.010 - 1.025   Blood, UA neg    pH, UA  5.0 - 8.0   POC Protein UA Negative Negative, Trace   Urobilinogen, UA  0.2 or 1.0 E.U./dL   Nitrite, UA neg    Leukocytes, UA Negative Negative   Appearance     Odor      Assessment & Plan:  1) Low-risk pregnancy G2P0010 at 6041w3d with an Estimated Date of Delivery: 04/23/18   2) Hyperemesis, improving, gained 2lbs since last visit, s/p 5d hospital stay, n/v controlled on current regimen. Let us know if worsens again, will f/u in 2wks to make sure continuing to improve/gain wt   Meds: No orders of the defined types were placed in this encounter.  Labs/procedures today: none  Plan:  Continue routine obstetrical care   Reviewed: Preterm labor symptoms and general obstetric precautions including but not limited to vaginal bleeding, contractions, leaking of fluid and fetal movement were reviewed in detail with the patient.  All questions were answered  Follow-up: Return in about 2 weeks (around 12/14/2017) for LROB.  Orders Placed This Encounter  Procedures  . POC Urinalysis Dipstick OB   Cheral MarkerKimberly R Booker CNM, [facility] 11/30/2017 10:41 AM

## 2017-12-01 ENCOUNTER — Encounter (HOSPITAL_COMMUNITY): Payer: Self-pay | Admitting: *Deleted

## 2017-12-01 ENCOUNTER — Inpatient Hospital Stay (HOSPITAL_COMMUNITY)
Admission: AD | Admit: 2017-12-01 | Discharge: 2017-12-01 | Disposition: A | Discharge status: Home / Self Care | Payer: Medicaid Other | Source: Ambulatory Visit | Attending: Obstetrics and Gynecology | Admitting: Obstetrics and Gynecology

## 2017-12-01 DIAGNOSIS — E860 Dehydration: Secondary | ICD-10-CM | POA: Diagnosis not present

## 2017-12-01 DIAGNOSIS — O210 Mild hyperemesis gravidarum: Secondary | ICD-10-CM | POA: Diagnosis not present

## 2017-12-01 DIAGNOSIS — Z3A19 19 weeks gestation of pregnancy: Secondary | ICD-10-CM | POA: Insufficient documentation

## 2017-12-01 DIAGNOSIS — O99282 Endocrine, nutritional and metabolic diseases complicating pregnancy, second trimester: Secondary | ICD-10-CM | POA: Diagnosis not present

## 2017-12-01 LAB — URINALYSIS, ROUTINE W REFLEX MICROSCOPIC
Bilirubin Urine: NEGATIVE
Glucose, UA: NEGATIVE mg/dL
HGB URINE DIPSTICK: NEGATIVE
Ketones, ur: 80 mg/dL — AB
Leukocytes, UA: NEGATIVE
Nitrite: NEGATIVE
Protein, ur: NEGATIVE mg/dL
SPECIFIC GRAVITY, URINE: 1.009 (ref 1.005–1.030)
pH: 7 (ref 5.0–8.0)

## 2017-12-01 MED ORDER — FAMOTIDINE IN NACL 20-0.9 MG/50ML-% IV SOLN
20.0000 mg | Freq: Once | INTRAVENOUS | Status: AC
  Administered 2017-12-01: 20 mg via INTRAVENOUS
  Filled 2017-12-01: qty 50

## 2017-12-01 MED ORDER — SODIUM CHLORIDE 0.9 % IV SOLN
8.0000 mg | Freq: Once | INTRAVENOUS | Status: AC
  Administered 2017-12-01: 8 mg via INTRAVENOUS
  Filled 2017-12-01: qty 4

## 2017-12-01 MED ORDER — LACTATED RINGERS IV SOLN
Freq: Once | INTRAVENOUS | Status: AC
  Administered 2017-12-01: 18:00:00 via INTRAVENOUS
  Filled 2017-12-01: qty 10

## 2017-12-01 MED ORDER — LACTATED RINGERS IV BOLUS
1000.0000 mL | Freq: Once | INTRAVENOUS | Status: AC
  Administered 2017-12-01: 1000 mL via INTRAVENOUS

## 2017-12-01 NOTE — [facility] Note (Signed)
Deborah Evans is a 22 y.o. at 2071w4d here in [facility] reporting:  +emesis Reports "25" episodes of vomiting in the past 24 hours States unable to keep anything down despite her meds Endorses recently being seen in [facility] for emesis  +heartburn  Pain score: denies Vitals:   12/01/17 1532  BP: 108/60  Pulse: 100  Resp: 17  Temp: 98.3 F (36.8 C)  SpO2: 100%     FHT:164 Lab orders placed from triage: [facility]

## 2017-12-01 NOTE — Discharge Instructions (Signed)
Eating Plan for Hyperemesis Gravidarum °Hyperemesis gravidarum is a severe form of morning sickness. Because this condition causes severe nausea and vomiting, it can lead to dehydration, malnutrition, and weight loss. One way to lessen the symptoms of nausea and vomiting is to follow the eating plan for hyperemesis gravidarum. It is often used along with prescribed medicines to control your symptoms. °What can I do to relieve my symptoms? °Listen to your body. Everyone is different and has different preferences. Find what works best for you. Take any of the following actions that are helpful to you: °· Eat and drink slowly. °· Eat 5-6 small meals daily instead of 3 large meals. °· Eat crackers before you get out of bed in the morning. °· Try having a snack in the middle of the night. °· Starchy foods are usually tolerated well. Examples include cereal, toast, bread, potatoes, pasta, rice, and pretzels. °· Ginger may help with nausea. Add ¼ tsp ground ginger to hot tea or choose ginger tea. °· Try drinking 100% fruit juice or an electrolyte drink. An electrolyte drink contains sodium, potassium, and chloride. °· Continue to take your prenatal vitamins as told by your health care provider. If you are having trouble taking your prenatal vitamins, talk with your health care provider about different options. °· Include at least 1 serving of protein with your meals and snacks. Protein options include meats or poultry, beans, nuts, eggs, and yogurt. Try eating a protein-rich snack before bed. Examples of these snacks include cheese and crackers or half of a peanut butter or turkey sandwich. °· Consider eliminating foods that trigger your symptoms. These may include spicy foods, coffee, high-fat foods, very sweet foods, and acidic foods. °· Try meals that have more protein combined with bland, salty, lower-fat, and dry foods, such as nuts, seeds, pretzels, crackers, and cereal. °· Talk with your healthcare provider about  starting a supplement of vitamin B6. °· Have fluids that are cold, clear, and carbonated or sour. Examples include lemonade, ginger ale, lemon-lime soda, ice water, and sparkling water. °· Try lemon or mint tea. °· Try brushing your teeth or using a mouth rinse after meals. ° °What should I avoid to reduce my symptoms? °Avoiding some of the following things may help reduce your symptoms. °· Foods with strong smells. Try eating meals in well-ventilated areas that are free of odors. °· Drinking water or other beverages with meals. Try not to drink anything during the 30 minutes before and after your meals. °· Drinking more than 1 cup of fluid at a time. Sometimes using a straw helps. °· Fried or high-fat foods, such as butter and cream sauces. °· Spicy foods. °· Skipping meals as best as you can. Nausea can be more intense on an empty stomach. If you cannot tolerate food at that time, do not force it. Try sucking on ice chips or other frozen items, and make up for missed calories later. °· Lying down within 2 hours after eating. °· Environmental triggers. These may include smoky rooms, closed spaces, rooms with strong smells, warm or humid places, overly loud and noisy rooms, and rooms with motion or flickering lights. °· Quick and sudden changes in your movement. ° °This information is not intended to replace advice given to you by your health care provider. Make sure you discuss any questions you have with your health care provider. °Document Released: 02/12/2007 Document Revised: 12/15/2015 Document Reviewed: 11/16/2015 °Elsevier Interactive Patient Education © 2018 [employer] ° ° °Hyperemesis Gravidarum °  Hyperemesis gravidarum is a severe form of nausea and vomiting that happens during pregnancy. Hyperemesis is worse than morning sickness. It may cause you to have nausea or vomiting all day for many days. It may keep you from eating and drinking enough food and liquids. Hyperemesis usually occurs during the  first half (the first 20 weeks) of pregnancy. It often goes away once a woman is in her second half of pregnancy. However, sometimes hyperemesis continues through an entire pregnancy. °What are the causes? °The cause of this condition is not known. It may be related to changes in chemicals (hormones) in the body during pregnancy, such as the high level of pregnancy hormone (human chorionic gonadotropin) or the increase in the female sex hormone (estrogen). °What are the signs or symptoms? °Symptoms of this condition include: °· Severe nausea and vomiting. °· Nausea that does not go away. °· Vomiting that does not allow you to keep any food down. °· Weight loss. °· Body fluid loss (dehydration). °· Having no desire to eat, or not liking food that you have previously enjoyed. ° °How is this diagnosed? °This condition may be diagnosed based on: °· A physical exam. °· Your medical history. °· Your symptoms. °· Blood tests. °· Urine tests. ° °How is this treated? °This condition may be managed with medicine. If medicines to do not help relieve nausea and vomiting, you may need to receive fluids through an IV tube at the hospital. °Follow these instructions at home: °· Take over-the-counter and prescription medicines only as told by your health care provider. °· Avoid iron pills and multivitamins that contain iron for the first 3-4 months of pregnancy. If you take prescription iron pills, do not stop taking them unless your health care provider approves. °· Take the following actions to help prevent nausea and vomiting: °? In the morning, before getting out of bed, try eating a couple of dry crackers or a piece of toast. °? Avoid foods and smells that upset your stomach. Fatty and spicy foods may make nausea worse. °? Eat 5-6 small meals a day. °? Do not drink fluids while eating meals. Drink between meals. °? Eat or suck on things that have ginger in them. Ginger can help relieve nausea. °? Avoid food preparation. The  smell of food can spoil your appetite or trigger nausea. °· Follow instructions from your health care provider about eating or drinking restrictions. °· For snacks, eat high-protein foods, such as cheese. °· Keep all follow-up and pre-birth (prenatal) visits as told by your health care provider. This is important. °Contact a health care provider if: °· You have pain in your abdomen. °· You have a severe headache. °· You have vision problems. °· You are losing weight. °Get help right away if: °· You cannot drink fluids without vomiting. °· You vomit blood. °· You have constant nausea and vomiting. °· You are very weak. °· You are very thirsty. °· You feel dizzy. °· You faint. °· You have a fever or other symptoms that last for more than 2-3 days. °· You have a fever and your symptoms suddenly get worse. °Summary °· Hyperemesis gravidarum is a severe form of nausea and vomiting that happens during pregnancy. °· Making some changes to your eating habits may help relieve nausea and vomiting. °· This condition may be managed with medicine. °· If medicines to do not help relieve nausea and vomiting, you may need to receive fluids through an IV tube at the hospital. °This information   is not intended to replace advice given to you by your health care provider. Make sure you discuss any questions you have with your health care provider. °Document Released: 04/17/2005 Document Revised: 12/15/2015 Document Reviewed: 12/15/2015 °Elsevier Interactive Patient Education © 2017 [employer] ° °

## 2017-12-01 NOTE — MAU Provider Note (Signed)
History      CSN: [id number]  Arrival date and time: 12/01/17 1523   First Provider Initiated Contact with Patient 12/01/17 1547      Chief Complaint  Patient presents with  . Emesis  . Heartburn   G2P0010 @19 .4 wks here with continue HEG. N/V started back last night. She cannot tolerate any goods and only water sometimes. She has been taking Compazine, Meclizine, Pepcid, and steroid. Denies bleeding or pain. She was discharged 2 days ago after admission for HEG.   OB History    Gravida  2   Para      Term      Preterm      AB  1   Living  0     SAB  1   TAB      Ectopic      Multiple      Live Births              Past Medical History:  Diagnosis Date  . Medical history non-contributory     Past Surgical History:  Procedure Laterality Date  . DILATION AND CURETTAGE OF UTERUS N/A 07/15/2014   Procedure: SUCTION DILATATION AND CURETTAGE;  Surgeon: Lazaro Arms, MD;  Location: [facility];  Service: Gynecology;  Laterality: N/A;    Family History  Problem Relation Age of Onset  . Hypertension Maternal Grandmother   . ADD / ADHD Neg Hx   . Alcohol abuse Neg Hx   . Anxiety disorder Neg Hx   . Arthritis Neg Hx   . Birth defects Neg Hx   . Asthma Neg Hx   . Cancer Neg Hx   . COPD Neg Hx   . Depression Neg Hx   . Diabetes Neg Hx   . Drug abuse Neg Hx   . Early death Neg Hx   . Hearing loss Neg Hx   . Heart disease Neg Hx   . Hyperlipidemia Neg Hx   . Intellectual disability Neg Hx   . Kidney disease Neg Hx   . Learning disabilities Neg Hx   . Miscarriages / Stillbirths Neg Hx   . Obesity Neg Hx   . Stroke Neg Hx   . Vision loss Neg Hx   . Varicose Veins Neg Hx     Social History   Tobacco Use  . Smoking status: Never Smoker  . Smokeless tobacco: Never Used  Substance Use Topics  . Alcohol use: No  . Drug use: No    Allergies: No Known Allergies  Medications Prior to Admission  Medication Sig Dispense Refill Last Dose  . meclizine  (ANTIVERT) 25 MG tablet Take 1 tablet (25 mg total) by mouth 3 (three) times daily. 30 tablet 0 12/01/2017 at 1400  . methylPREDNISolone (MEDROL) 4 MG tablet Take 1 tablet (4 mg total) by mouth daily. Refer to table given by inpatient pharmacist 25 tablet 0 12/01/2017 at 0800  . metoCLOPramide (REGLAN) 5 MG tablet Take 1 tablet (5 mg total) by mouth every 6 (six) hours as needed for nausea or vomiting. 120 tablet 3 12/01/2017 at 1400  . pantoprazole (PROTONIX) 20 MG tablet 2 tabs daily for heartburn. 30 tablet 1 12/01/2017 at 0800  . Prenatal MV-Min-FA-Omega-3 (PRENATAL GUMMIES/DHA & FA PO) Take by mouth.   12/01/2017 at 0800  . prochlorperazine (COMPAZINE) 10 MG tablet Take 1 tablet (10 mg total) by mouth every 6 (six) hours as needed for nausea or vomiting. 30 tablet 0 12/01/2017 at 1400  Review of Systems  Constitutional: Negative for fever.  Gastrointestinal: Positive for nausea and vomiting. Negative for abdominal pain, constipation and diarrhea.  Genitourinary: Negative for vaginal bleeding.   Physical Exam   Blood pressure 108/60, pulse 100, temperature 98.3 F (36.8 C), temperature source Oral, resp. rate 17, weight 143 lb 1.9 oz (64.9 kg), last menstrual period 08/23/2017, SpO2 100 %, unknown if currently breastfeeding.  Physical Exam  Constitutional: She is oriented to person, place, and time. She appears well-developed and well-nourished.  HENT:  Head: Normocephalic and atraumatic.  Neck: Normal range of motion.  Respiratory: Effort normal. No respiratory distress.  Musculoskeletal: Normal range of motion.  Neurological: She is alert and oriented to person, place, and time.  Skin: Skin is warm and dry.  Psychiatric: She has a normal mood and affect.  FHT 164  Results for orders placed or performed during the hospital encounter of 12/01/17 (from the past 24 hour(s))  Urinalysis, Routine w reflex microscopic     Status: Abnormal   Collection Time: 12/01/17  3:45 PM  Result Value Ref  Range   Color, Urine YELLOW YELLOW   APPearance HAZY (A) CLEAR   Specific Gravity, Urine 1.009 1.005 - 1.030   pH 7.0 5.0 - 8.0   Glucose, UA NEGATIVE NEGATIVE mg/dL   Hgb urine dipstick NEGATIVE NEGATIVE   Bilirubin Urine NEGATIVE NEGATIVE   Ketones, ur 80 (A) NEGATIVE mg/dL   Protein, ur NEGATIVE NEGATIVE mg/dL   Nitrite NEGATIVE NEGATIVE   Leukocytes, UA NEGATIVE NEGATIVE   MAU Course  Procedures LR MTV Pepcid Zofran  MDM Labs ordered and reviewed. No further emesis. Tolerating po. Recommend Zofran (has at home), continue steroid and Protonix. Stable for discharge home.   Assessment and Plan   1. [redacted] weeks gestation of pregnancy   2. Hyperemesis gravidarum   3. Dehydration    Discharge home Follow up in OB office as scheduled Maintain hydration HEG diet  Allergies as of 12/01/2017   No Known Allergies     Medication List    STOP taking these medications   meclizine 25 MG tablet Commonly known as:  ANTIVERT   metoCLOPramide 5 MG tablet Commonly known as:  REGLAN   prochlorperazine 10 MG tablet Commonly known as:  COMPAZINE     TAKE these medications   methylPREDNISolone 4 MG tablet Commonly known as:  MEDROL Take 1 tablet (4 mg total) by mouth daily. Refer to table given by inpatient pharmacist   pantoprazole 20 MG tablet Commonly known as:  PROTONIX 2 tabs daily for heartburn.   PRENATAL GUMMIES/DHA & FA PO Take by mouth.      Donette LarryMelanie Opie Maclaughlin, CNM 12/01/2017, 3:53 PM

## 2017-12-02 ENCOUNTER — Inpatient Hospital Stay (HOSPITAL_COMMUNITY): Payer: Medicaid Other

## 2017-12-02 ENCOUNTER — Encounter (HOSPITAL_COMMUNITY): Payer: Self-pay

## 2017-12-02 ENCOUNTER — Observation Stay (HOSPITAL_COMMUNITY)
Admission: AD | Admit: 2017-12-02 | Discharge: 2017-12-03 | Disposition: A | Discharge status: Home / Self Care | Payer: Medicaid Other | Source: Ambulatory Visit | Attending: Obstetrics & Gynecology | Admitting: Obstetrics & Gynecology

## 2017-12-02 ENCOUNTER — Other Ambulatory Visit: Payer: Self-pay

## 2017-12-02 DIAGNOSIS — O211 Hyperemesis gravidarum with metabolic disturbance: Secondary | ICD-10-CM | POA: Diagnosis not present

## 2017-12-02 DIAGNOSIS — Z79899 Other long term (current) drug therapy: Secondary | ICD-10-CM | POA: Diagnosis not present

## 2017-12-02 DIAGNOSIS — R1110 Vomiting, unspecified: Secondary | ICD-10-CM | POA: Diagnosis present

## 2017-12-02 DIAGNOSIS — E876 Hypokalemia: Secondary | ICD-10-CM | POA: Insufficient documentation

## 2017-12-02 DIAGNOSIS — Z3A19 19 weeks gestation of pregnancy: Secondary | ICD-10-CM | POA: Diagnosis not present

## 2017-12-02 DIAGNOSIS — O210 Mild hyperemesis gravidarum: Secondary | ICD-10-CM | POA: Diagnosis present

## 2017-12-02 DIAGNOSIS — Z3490 Encounter for supervision of normal pregnancy, unspecified, unspecified trimester: Secondary | ICD-10-CM

## 2017-12-02 LAB — URINALYSIS, ROUTINE W REFLEX MICROSCOPIC
BACTERIA UA: NONE SEEN
Bilirubin Urine: NEGATIVE
GLUCOSE, UA: NEGATIVE mg/dL
HGB URINE DIPSTICK: NEGATIVE
Ketones, ur: 80 mg/dL — AB
Leukocytes, UA: NEGATIVE
NITRITE: NEGATIVE
PROTEIN: 30 mg/dL — AB
Specific Gravity, Urine: 1.016 (ref 1.005–1.030)
Trans Epithel, UA: <1
pH: 8 (ref 5.0–8.0)

## 2017-12-02 LAB — COMPREHENSIVE METABOLIC PANEL
ALT: 31 U/L (ref 0–44)
ANION GAP: 11 (ref 5–15)
AST: 65 U/L — ABNORMAL HIGH (ref 15–41)
Albumin: 3.9 g/dL (ref 3.5–5.0)
Alkaline Phosphatase: 53 U/L (ref 38–126)
BUN: <5 mg/dL — ABNORMAL LOW (ref 6–20)
CHLORIDE: 101 mmol/L (ref 98–111)
CO2: 18 mmol/L — ABNORMAL LOW (ref 22–32)
Calcium: 8.6 mg/dL — ABNORMAL LOW (ref 8.9–10.3)
Creatinine, Ser: 0.32 mg/dL — ABNORMAL LOW (ref 0.44–1.00)
Glucose, Bld: 81 mg/dL (ref 70–99)
Potassium: 5.9 mmol/L — ABNORMAL HIGH (ref 3.5–5.1)
Sodium: 130 mmol/L — ABNORMAL LOW (ref 135–145)
Total Bilirubin: 2.5 mg/dL — ABNORMAL HIGH (ref 0.3–1.2)
Total Protein: 6.8 g/dL (ref 6.5–8.1)

## 2017-12-02 LAB — CBC WITH DIFFERENTIAL/PLATELET
Basophils Absolute: 0 10*3/uL (ref 0.0–0.1)
Basophils Relative: 0 %
EOS ABS: 0 10*3/uL (ref 0.0–0.7)
Eosinophils Relative: 0 %
HCT: 33.7 % — ABNORMAL LOW (ref 36.0–46.0)
HEMOGLOBIN: 11.6 g/dL — AB (ref 12.0–15.0)
LYMPHS ABS: 1.7 10*3/uL (ref 0.7–4.0)
LYMPHS PCT: 18 %
MCH: 27.8 pg (ref 26.0–34.0)
MCHC: 34.4 g/dL (ref 30.0–36.0)
MCV: 80.6 fL (ref 78.0–100.0)
Monocytes Absolute: 0.7 10*3/uL (ref 0.1–1.0)
Monocytes Relative: 8 %
NEUTROS PCT: 74 %
Neutro Abs: 6.8 10*3/uL (ref 1.7–7.7)
Platelets: 211 10*3/uL (ref 150–400)
RBC: 4.18 MIL/uL (ref 3.87–5.11)
RDW: 14.9 % (ref 11.5–15.5)
WBC: 9.2 10*3/uL (ref 4.0–10.5)

## 2017-12-02 MED ORDER — PROMETHAZINE HCL 25 MG/ML IJ SOLN
25.0000 mg | Freq: Once | INTRAMUSCULAR | Status: AC
  Administered 2017-12-02: 25 mg via INTRAVENOUS
  Filled 2017-12-02: qty 1

## 2017-12-02 MED ORDER — SODIUM CHLORIDE 0.9 % IV SOLN
8.0000 mg | Freq: Once | INTRAVENOUS | Status: AC
  Administered 2017-12-02: 8 mg via INTRAVENOUS
  Filled 2017-12-02: qty 4

## 2017-12-02 MED ORDER — FAMOTIDINE IN NACL 20-0.9 MG/50ML-% IV SOLN
20.0000 mg | Freq: Once | INTRAVENOUS | Status: AC
  Administered 2017-12-02: 20 mg via INTRAVENOUS
  Filled 2017-12-02: qty 50

## 2017-12-02 MED ORDER — PANTOPRAZOLE SODIUM 20 MG PO TBEC
20.0000 mg | DELAYED_RELEASE_TABLET | Freq: Two times a day (BID) | ORAL | Status: DC
  Administered 2017-12-03 (×2): 20 mg via ORAL
  Filled 2017-12-02 (×4): qty 1

## 2017-12-02 MED ORDER — PROCHLORPERAZINE EDISYLATE 10 MG/2ML IJ SOLN
10.0000 mg | Freq: Once | INTRAMUSCULAR | Status: AC
  Administered 2017-12-02: 10 mg via INTRAVENOUS
  Filled 2017-12-02: qty 2

## 2017-12-02 MED ORDER — SODIUM CHLORIDE 0.9 % IV SOLN
8.0000 mg | Freq: Three times a day (TID) | INTRAVENOUS | Status: DC
  Administered 2017-12-03 (×2): 8 mg via INTRAVENOUS
  Filled 2017-12-02 (×3): qty 4

## 2017-12-02 NOTE — MAU Provider Note (Signed)
ANTEPARTUM ADMISSION HISTORY AND PHYSICAL NOTE   History of Present Illness: Deborah Evans is a 22 y.o. G2P0010 at 363w5d admitted for hyperemesis gravidarum. Patient has had multiple MAU visits and one Antepartum Admission. Was discharged home on 8/1 with RX for protonix, Steroid taper, compazine, meclizine and reglan. Patient was seen in MAU on 8-3 and 8-4; after discussion with Dr. Erin FullingHarraway-Smith patient to be admitted.    Patient reports the fetal movement as NA. Marland Kitchen. Patient reports uterine contraction  activity as none.  Patient reports  vaginal bleeding as none. Fetal presentation is unsure and NA. Marland Kitchen.  Patient Active Problem List   Diagnosis Date Noted  . Hypomagnesemia 11/28/2017  . Hypokalemia due to loss of potassium 11/28/2017  . Hyperemesis 11/24/2017  . Supervision of normal pregnancy 11/08/2017    Past Medical History:  Diagnosis Date  . Medical history non-contributory     Past Surgical History:  Procedure Laterality Date  . DILATION AND CURETTAGE OF UTERUS N/A 07/15/2014   Procedure: SUCTION DILATATION AND CURETTAGE;  Surgeon: Lazaro ArmsLuther H Eure, MD;  Location: [facility];  Service: Gynecology;  Laterality: N/A;    OB History  Gravida Para Term Preterm AB Living  2       1 0  SAB TAB Ectopic Multiple Live Births  1            # Outcome Date GA Lbr Len/2nd Weight Sex Delivery Anes PTL Lv  2 Current           1 SAB             Social History   Socioeconomic History  . Marital status: Single    Spouse name: Not on file  . Number of children: Not on file  . Years of education: Not on file  . Highest education level: Not on file  Occupational History  . Not on file  Social Needs  . Financial resource strain: Not on file  . Food insecurity:    Worry: Not on file    Inability: Not on file  . Transportation needs:    Medical: Not on file    Non-medical: Not on file  Tobacco Use  . Smoking status: Never Smoker  . Smokeless tobacco: Never Used  Substance and  Sexual Activity  . Alcohol use: No  . Drug use: No  . Sexual activity: Yes    Birth control/protection: None  Lifestyle  . Physical activity:    Days per week: Not on file    Minutes per session: Not on file  . Stress: Not on file  Relationships  . Social connections:    Talks on phone: Not on file    Gets together: Not on file    Attends religious service: Not on file    Active member of club or organization: Not on file    Attends meetings of clubs or organizations: Not on file    Relationship status: Not on file  Other Topics Concern  . Not on file  Social History Narrative  . Not on file    Family History  Problem Relation Age of Onset  . Hypertension Maternal Grandmother   . ADD / ADHD Neg Hx   . Alcohol abuse Neg Hx   . Anxiety disorder Neg Hx   . Arthritis Neg Hx   . Birth defects Neg Hx   . Asthma Neg Hx   . Cancer Neg Hx   . COPD Neg Hx   . Depression  Neg Hx   . Diabetes Neg Hx   . Drug abuse Neg Hx   . Early death Neg Hx   . Hearing loss Neg Hx   . Heart disease Neg Hx   . Hyperlipidemia Neg Hx   . Intellectual disability Neg Hx   . Kidney disease Neg Hx   . Learning disabilities Neg Hx   . Miscarriages / Stillbirths Neg Hx   . Obesity Neg Hx   . Stroke Neg Hx   . Vision loss Neg Hx   . Varicose Veins Neg Hx     No Known Allergies  Medications Prior to Admission  Medication Sig Dispense Refill Last Dose  . methylPREDNISolone (MEDROL) 4 MG tablet Take 1 tablet (4 mg total) by mouth daily. Refer to table given by inpatient pharmacist 25 tablet 0 12/01/2017 at 0800  . pantoprazole (PROTONIX) 20 MG tablet 2 tabs daily for heartburn. 30 tablet 1 12/01/2017 at 0800  . Prenatal MV-Min-FA-Omega-3 (PRENATAL GUMMIES/DHA & FA PO) Take by mouth.   12/01/2017 at 0800    Review of Systems - Positive for vomiting, abdominal pain.  Vitals:  BP 116/60   Pulse 96   Temp 98.4 F (36.9 C) (Oral)   Resp 16   Ht 5\' 8"  (1.727 m)   Wt 142 lb 14.4 oz (64.8 kg)   LMP  08/23/2017 (Approximate)   BMI 21.73 kg/m  Physical Examination: CONSTITUTIONAL: Well-developed, well-nourished female in no acute distress.  HENT:  Normocephalic, atraumatic, External right and left ear normal. Oropharynx is clear and moist EYES: Conjunctivae and EOM are normal. Pupils are equal, round, and reactive to light. No scleral icterus.  NECK: Normal range of motion, supple, no masses SKIN: Skin is warm and dry. No rash noted. Not diaphoretic. No erythema. No pallor. NEUROLGIC: Alert and oriented to person, place, and time. Normal reflexes, muscle tone coordination. No cranial nerve deficit noted. PSYCHIATRIC: Normal mood and affect. Normal behavior. Normal judgment and thought content. CARDIOVASCULAR: Normal heart rate noted, regular rhythm RESPIRATORY: Effort and breath sounds normal, no problems with respiration noted ABDOMEN: Soft, nontender, nondistended, gravid. MUSCULOSKELETAL: Normal range of motion. No edema and no tenderness. 2+ distal pulses.  Cervix: Not examined  Membranes:intact Fetal Monitoring:NA.  Tocometer: Flat  Labs:  Results for orders placed or performed during the hospital encounter of 12/02/17 (from the past 24 hour(s))  Urinalysis, Routine w reflex microscopic   Collection Time: 12/02/17  8:12 PM  Result Value Ref Range   Color, Urine YELLOW YELLOW   APPearance CLEAR CLEAR   Specific Gravity, Urine 1.016 1.005 - 1.030   pH 8.0 5.0 - 8.0   Glucose, UA NEGATIVE NEGATIVE mg/dL   Hgb urine dipstick NEGATIVE NEGATIVE   Bilirubin Urine NEGATIVE NEGATIVE   Ketones, ur 80 (A) NEGATIVE mg/dL   Protein, ur 30 (A) NEGATIVE mg/dL   Nitrite NEGATIVE NEGATIVE   Leukocytes, UA NEGATIVE NEGATIVE   WBC, UA 0-5 0 - 5 WBC/hpf   Bacteria, UA NONE SEEN NONE SEEN   Squamous Epithelial / LPF 0-5 0 - 5   Trans Epithel, UA <1    Mucus PRESENT   Comprehensive metabolic panel   Collection Time: 12/02/17  9:05 PM  Result Value Ref Range   Sodium 130 (L) 135 - 145  mmol/L   Potassium 5.9 (H) 3.5 - 5.1 mmol/L   Chloride 101 98 - 111 mmol/L   CO2 18 (L) 22 - 32 mmol/L   Glucose, Bld 81 70 - 99 mg/dL  BUN <5 (L) 6 - 20 mg/dL   Creatinine, Ser 1.61 (L) 0.44 - 1.00 mg/dL   Calcium 8.6 (L) 8.9 - 10.3 mg/dL   Total Protein 6.8 6.5 - 8.1 g/dL   Albumin 3.9 3.5 - 5.0 g/dL   AST 65 (H) 15 - 41 U/L   ALT 31 0 - 44 U/L   Alkaline Phosphatase 53 38 - 126 U/L   Total Bilirubin 2.5 (H) 0.3 - 1.2 mg/dL   GFR calc non Af Amer >60 >60 mL/min   GFR calc Af Amer >60 >60 mL/min   Anion gap 11 5 - 15  CBC with Differential/Platelet   Collection Time: 12/02/17  9:05 PM  Result Value Ref Range   WBC 9.2 4.0 - 10.5 K/uL   RBC 4.18 3.87 - 5.11 MIL/uL   Hemoglobin 11.6 (L) 12.0 - 15.0 g/dL   HCT 09.6 (L) 04.5 - 40.9 %   MCV 80.6 78.0 - 100.0 fL   MCH 27.8 26.0 - 34.0 pg   MCHC 34.4 30.0 - 36.0 g/dL   RDW 81.1 91.4 - 78.2 %   Platelets 211 150 - 400 K/uL   Neutrophils Relative % 74 %   Neutro Abs 6.8 1.7 - 7.7 K/uL   Lymphocytes Relative 18 %   Lymphs Abs 1.7 0.7 - 4.0 K/uL   Monocytes Relative 8 %   Monocytes Absolute 0.7 0.1 - 1.0 K/uL   Eosinophils Relative 0 %   Eosinophils Absolute 0.0 0.0 - 0.7 K/uL   Basophils Relative 0 %   Basophils Absolute 0.0 0.0 - 0.1 K/uL    Imaging Studies: No results found.   Assessment and Plan: Patient Active Problem List   Diagnosis Date Noted  . Hypomagnesemia 11/28/2017  . Hypokalemia due to loss of potassium 11/28/2017  . Hyperemesis 11/24/2017  . Supervision of normal pregnancy 11/08/2017   Admit to Antenatal Routine antenatal care  1. FHR q shift 2. Continue predisone, protonix, compazine, meclizine and Zofran.  3. Dubhoff tube placement 4. Continue IV LR  5. Abdominal US    Marylene Land, CNM

## 2017-12-02 NOTE — MAU Note (Addendum)
G2P @ 19.[redacted] wksga. Here dt N/V dizziness, and weakness and heartburn. N/v with has been ongoing with the pregnancy.  Denies LOF or bleeding.   Doppler: 155  BP 107/62   Pulse 84   Temp 98.4 F (36.9 C) (Oral)   Resp 16   Ht 5\' 8"  (1.727 m)   Wt 142 lb 14.4 oz (64.8 kg)   LMP 08/23/2017 (Approximate)   BMI 21.73 kg/m   2011: provider at bs assessing. Urine sent.   2050: provider made aware of the 80 ketones. Orders received for d5lr with phenergan, CBC and CMP. Lab made aware that labs will be drawn with IV placement.   2100 labs drawn and sent. IV saline lock placed. Pending phenergan bag from pharmacy.   2115: D5LR with phenergan up and bolus infusing  2117: blanket applied   2142 checked on patient. C/o heartburn. Provider aware and will order medication for the heart burn   2152: pepcid hung by RN Samara DeistKathryn.   2208: relinquished care over to RN Tobi BastosAnna

## 2017-12-02 NOTE — MAU Provider Note (Addendum)
History     CSN: [id number]  Arrival date and time: 12/02/17 1913   None     Chief Complaint  Patient presents with  . Nausea  . Emesis  . Dizziness  . Fatigue   HPI Deborah Evans is 22 y.o. G2P0010 6742w5d weeks presenting with hyperemesis.  She has been seen multiple times last week  and admitted for 4 days.  Discharged 8/1 and returned to MAU yesterday.  Given IV hydration, Pepcid.  She returns tonight stating she took Med this am and was able to eat.  Woke up at 8am tried to eat again was unable to keep anything down.  Vomited X 20+ times.  Last ate at 7:30 tonight but didn't stay down.      Past Medical History:  Diagnosis Date  . Medical history non-contributory     Past Surgical History:  Procedure Laterality Date  . DILATION AND CURETTAGE OF UTERUS N/A 07/15/2014   Procedure: SUCTION DILATATION AND CURETTAGE;  Surgeon: Lazaro ArmsLuther H Eure, MD;  Location: [facility];  Service: Gynecology;  Laterality: N/A;    Family History  Problem Relation Age of Onset  . Hypertension Maternal Grandmother   . ADD / ADHD Neg Hx   . Alcohol abuse Neg Hx   . Anxiety disorder Neg Hx   . Arthritis Neg Hx   . Birth defects Neg Hx   . Asthma Neg Hx   . Cancer Neg Hx   . COPD Neg Hx   . Depression Neg Hx   . Diabetes Neg Hx   . Drug abuse Neg Hx   . Early death Neg Hx   . Hearing loss Neg Hx   . Heart disease Neg Hx   . Hyperlipidemia Neg Hx   . Intellectual disability Neg Hx   . Kidney disease Neg Hx   . Learning disabilities Neg Hx   . Miscarriages / Stillbirths Neg Hx   . Obesity Neg Hx   . Stroke Neg Hx   . Vision loss Neg Hx   . Varicose Veins Neg Hx     Social History   Tobacco Use  . Smoking status: Never Smoker  . Smokeless tobacco: Never Used  Substance Use Topics  . Alcohol use: No  . Drug use: No    Allergies: No Known Allergies  Medications Prior to Admission  Medication Sig Dispense Refill Last Dose  . methylPREDNISolone (MEDROL) 4 MG tablet Take 1 tablet (4  mg total) by mouth daily. Refer to table given by inpatient pharmacist 25 tablet 0 12/01/2017 at 0800  . pantoprazole (PROTONIX) 20 MG tablet 2 tabs daily for heartburn. 30 tablet 1 12/01/2017 at 0800  . Prenatal MV-Min-FA-Omega-3 (PRENATAL GUMMIES/DHA & FA PO) Take by mouth.   12/01/2017 at 0800    Review of Systems  Constitutional: Positive for appetite change. Negative for activity change.  Gastrointestinal: Positive for nausea and vomiting. Negative for abdominal pain.  Genitourinary: Negative for dysuria and vaginal bleeding.   Physical Exam   Blood pressure 107/62, pulse 84, temperature 98.4 F (36.9 C), temperature source Oral, resp. rate 16, height 5\' 8"  (1.727 m), weight 142 lb 14.4 oz (64.8 kg), last menstrual period 08/23/2017, unknown if currently breastfeeding.  Physical Exam  Nursing note and vitals reviewed. Constitutional: She is oriented to person, place, and time. She appears well-developed and well-nourished.  HENT:  Head: Normocephalic.  Neck: Normal range of motion.  Cardiovascular: Normal rate and regular rhythm.  Neurological: She is alert and  oriented to person, place, and time.  Skin: Skin is warm and dry.  Psychiatric: She has a normal mood and affect. Her behavior is normal. Thought content normal.   Results for orders placed or performed during the hospital encounter of 12/02/17 (from the past 24 hour(s))  Urinalysis, Routine w reflex microscopic     Status: Abnormal   Collection Time: 12/02/17  8:12 PM  Result Value Ref Range   Color, Urine YELLOW YELLOW   APPearance CLEAR CLEAR   Specific Gravity, Urine 1.016 1.005 - 1.030   pH 8.0 5.0 - 8.0   Glucose, UA NEGATIVE NEGATIVE mg/dL   Hgb urine dipstick NEGATIVE NEGATIVE   Bilirubin Urine NEGATIVE NEGATIVE   Ketones, ur 80 (A) NEGATIVE mg/dL   Protein, ur 30 (A) NEGATIVE mg/dL   Nitrite NEGATIVE NEGATIVE   Leukocytes, UA NEGATIVE NEGATIVE   WBC, UA 0-5 0 - 5 WBC/hpf   Bacteria, UA NONE SEEN NONE SEEN    Squamous Epithelial / LPF 0-5 0 - 5   Trans Epithel, UA <1    Mucus PRESENT    MAU Course  Procedures  MDM MSE Labs Care turned over to K. Crisoforo Oxford, CNM   Assessment and Plan  A;  Hyperemesis in second trimester  Eve M Key 12/02/2017, 8:07 PM   940: Patient will try Pepcid and Zofran IV push, now on 2nd bag of fluid and still with vomiting.   Discussed case with Dr. Erin Fulling, who recommends abdominal US, admission and Dubhoff tube.   Patient amenable to plan of care.   Luna Kitchens

## 2017-12-03 ENCOUNTER — Other Ambulatory Visit: Payer: Self-pay

## 2017-12-03 DIAGNOSIS — Z3A19 19 weeks gestation of pregnancy: Secondary | ICD-10-CM | POA: Diagnosis not present

## 2017-12-03 DIAGNOSIS — O210 Mild hyperemesis gravidarum: Secondary | ICD-10-CM | POA: Diagnosis present

## 2017-12-03 DIAGNOSIS — D734 Cyst of spleen: Secondary | ICD-10-CM | POA: Diagnosis not present

## 2017-12-03 DIAGNOSIS — K8020 Calculus of gallbladder without cholecystitis without obstruction: Secondary | ICD-10-CM | POA: Diagnosis not present

## 2017-12-03 DIAGNOSIS — O211 Hyperemesis gravidarum with metabolic disturbance: Principal | ICD-10-CM

## 2017-12-03 LAB — COMPREHENSIVE METABOLIC PANEL
ALBUMIN: 2.9 g/dL — AB (ref 3.5–5.0)
ALK PHOS: 46 U/L (ref 38–126)
ALT: 20 U/L (ref 0–44)
ALT: 25 U/L (ref 0–44)
ANION GAP: 9 (ref 5–15)
AST: 19 U/L (ref 15–41)
AST: 23 U/L (ref 15–41)
Albumin: 3.4 g/dL — ABNORMAL LOW (ref 3.5–5.0)
Alkaline Phosphatase: 41 U/L (ref 38–126)
Anion gap: 8 (ref 5–15)
BILIRUBIN TOTAL: 1.1 mg/dL (ref 0.3–1.2)
BUN: 5 mg/dL — ABNORMAL LOW (ref 6–20)
CALCIUM: 8.1 mg/dL — AB (ref 8.9–10.3)
CALCIUM: 8.1 mg/dL — AB (ref 8.9–10.3)
CO2: 20 mmol/L — ABNORMAL LOW (ref 22–32)
CO2: 19 mmol/L — AB (ref 22–32)
CREATININE: 0.34 mg/dL — AB (ref 0.44–1.00)
CREATININE: 0.39 mg/dL — AB (ref 0.44–1.00)
Chloride: 104 mmol/L (ref 98–111)
Chloride: 105 mmol/L (ref 98–111)
GFR calc Af Amer: >60 mL/min (ref >60)
Glucose, Bld: 101 mg/dL — ABNORMAL HIGH (ref 70–99)
Glucose, Bld: 144 mg/dL — ABNORMAL HIGH (ref 70–99)
Potassium: 2.9 mmol/L — ABNORMAL LOW (ref 3.5–5.1)
Potassium: 3.2 mmol/L — ABNORMAL LOW (ref 3.5–5.1)
SODIUM: 132 mmol/L — AB (ref 135–145)
Sodium: 133 mmol/L — ABNORMAL LOW (ref 135–145)
Total Bilirubin: 1 mg/dL (ref 0.3–1.2)
Total Protein: 5.4 g/dL — ABNORMAL LOW (ref 6.5–8.1)
Total Protein: 6.1 g/dL — ABNORMAL LOW (ref 6.5–8.1)

## 2017-12-03 LAB — MAGNESIUM
MAGNESIUM: 1.9 mg/dL (ref 1.7–2.4)
Magnesium: 1.8 mg/dL (ref 1.7–2.4)

## 2017-12-03 LAB — POTASSIUM: POTASSIUM: 3.2 mmol/L — AB (ref 3.5–5.1)

## 2017-12-03 MED ORDER — POTASSIUM CHLORIDE CRYS ER 20 MEQ PO TBCR: 40.0000 meq | EXTENDED_RELEASE_TABLET | Freq: Every day | ORAL | 1 refills | Status: DC

## 2017-12-03 MED ORDER — METHYLPREDNISOLONE 16 MG PO TABS
16.0000 mg | ORAL_TABLET | Freq: Every day | ORAL | Status: DC
  Filled 2017-12-03: qty 1

## 2017-12-03 MED ORDER — METHYLPREDNISOLONE 16 MG PO TABS: 16.0000 mg | ORAL_TABLET | Freq: Every day | ORAL | Status: DC

## 2017-12-03 MED ORDER — METHYLPREDNISOLONE 4 MG PO TABS: 8.0000 mg | ORAL_TABLET | Freq: Every day | ORAL | Status: DC

## 2017-12-03 MED ORDER — MECLIZINE HCL 25 MG PO TABS
25.0000 mg | ORAL_TABLET | Freq: Three times a day (TID) | ORAL | Status: DC
  Administered 2017-12-03: 25 mg via ORAL
  Filled 2017-12-03 (×4): qty 1

## 2017-12-03 MED ORDER — LACTATED RINGERS IV SOLN: INTRAVENOUS | Status: DC

## 2017-12-03 MED ORDER — CALCIUM CARBONATE-VITAMIN D 500-200 MG-UNIT PO TABS
2.0000 | ORAL_TABLET | Freq: Every day | ORAL | Status: DC
  Administered 2017-12-03: 2 via ORAL
  Filled 2017-12-03 (×2): qty 2

## 2017-12-03 MED ORDER — KCL IN DEXTROSE-NACL 40-5-0.45 MEQ/L-%-% IV SOLN
INTRAVENOUS | Status: DC
  Administered 2017-12-03: 01:00:00 via INTRAVENOUS
  Filled 2017-12-03 (×3): qty 1000

## 2017-12-03 MED ORDER — METHYLPREDNISOLONE 4 MG PO TABS: 4.0000 mg | ORAL_TABLET | Freq: Every day | ORAL | Status: DC

## 2017-12-03 MED ORDER — POTASSIUM CHLORIDE 10 MEQ/100ML IV SOLN
10.0000 meq | INTRAVENOUS | Status: AC
  Administered 2017-12-03 (×5): 10 meq via INTRAVENOUS
  Filled 2017-12-03 (×5): qty 100

## 2017-12-03 MED ORDER — CALCIUM CITRATE-VITAMIN D 500-500 MG-UNIT PO CHEW: 2.0000 | CHEWABLE_TABLET | Freq: Every day | ORAL | Status: DC

## 2017-12-03 MED ORDER — ENOXAPARIN SODIUM 40 MG/0.4ML ~~LOC~~ SOLN
40.0000 mg | SUBCUTANEOUS | Status: DC
  Administered 2017-12-03: 40 mg via SUBCUTANEOUS
  Filled 2017-12-03: qty 0.4

## 2017-12-03 MED ORDER — CALCIUM CARBONATE ANTACID 500 MG PO CHEW: 2.0000 | CHEWABLE_TABLET | ORAL | Status: DC | PRN

## 2017-12-03 MED ORDER — PROCHLORPERAZINE MALEATE 10 MG PO TABS
10.0000 mg | ORAL_TABLET | Freq: Four times a day (QID) | ORAL | Status: DC
  Administered 2017-12-03 (×2): 10 mg via ORAL
  Filled 2017-12-03 (×6): qty 1

## 2017-12-03 MED ORDER — DOCUSATE SODIUM 100 MG PO CAPS
100.0000 mg | ORAL_CAPSULE | Freq: Every day | ORAL | Status: DC
  Administered 2017-12-03: 100 mg via ORAL
  Filled 2017-12-03: qty 1

## 2017-12-03 MED ORDER — METHYLPREDNISOLONE 16 MG PO TABS
16.0000 mg | ORAL_TABLET | Freq: Three times a day (TID) | ORAL | Status: DC
  Administered 2017-12-03 (×2): 16 mg via ORAL
  Filled 2017-12-03 (×3): qty 1

## 2017-12-03 MED ORDER — ZOLPIDEM TARTRATE 5 MG PO TABS: 5.0000 mg | ORAL_TABLET | Freq: Every evening | ORAL | Status: DC | PRN

## 2017-12-03 NOTE — Progress Notes (Signed)
Discharge teaching complete with pt. Pt understood all information and did not have any questions. 

## 2017-12-03 NOTE — Discharge Summary (Signed)
Physician Discharge Summary  Patient ID: Deborah Evans MRN: [id number] DOB/AGE: 10-10-1995 22 y.o.  Admit date: 12/02/2017 Discharge date: 12/03/2017 Principal Problem:   Hyperemesis Active Problems:   Supervision of normal pregnancy   Hypomagnesemia   Hypokalemia due to loss of potassium   Hyperemesis affecting pregnancy, antepartum Admission Diagnoses:[redacted]w[redacted]d,  hyperemesis  Discharge Diagnoses:  Principal Problem:   Hyperemesis Active Problems:   Supervision of normal pregnancy   Hypomagnesemia   Hypokalemia due to loss of potassium   Hyperemesis affecting pregnancy, antepartum   Discharged Condition: good  Hospital Course: History of Present Illness:  Deborah Evans is a 22 y.o. G2P0010 at [redacted]w[redacted]d admitted for hyperemesis gravidarum. Patient has had multiple MAU visits and one Antepartum Admission. Was discharged home on 8/1 with RX for protonix, Steroid taper, compazine, meclizine and reglan. Patient was seen in MAU on 8-3 and 8-4; after discussion with Dr. Erin Fulling patient to be admitted.  Patient reports the fetal movement as NA. Marland Kitchen  Patient reports uterine contraction activity as none.  Patient reports vaginal bleeding as none.  Fetal presentation is unsure and NA. Marland Kitchen      Patient Active Problem List   Diagnosis Date Noted  . Hypomagnesemia 11/28/2017  . Hypokalemia due to loss of potassium 11/28/2017  . Hyperemesis 11/24/2017  . Supervision of normal pregnancy 11/08/2017       Past Medical History:  Diagnosis Date  . Medical history non-contributory         Past Surgical History:  Procedure Laterality Date  . DILATION AND CURETTAGE OF UTERUS N/A 07/15/2014   Procedure: SUCTION DILATATION AND CURETTAGE; Surgeon: Lazaro Arms, MD; Location: [facility]; Service: Gynecology; Laterality: N/A;                   OB History  Gravida Para Term Preterm AB Living  2    1 0  SAB TAB Ectopic Multiple Live Births     1           # Outcome Date GA Lbr Len/2nd Weight  Sex Delivery Anes PTL Lv  2 Current           1 SAB            Social History        Socioeconomic History  . Marital status: Single    Spouse name: Not on file  . Number of children: Not on file  . Years of education: Not on file  . Highest education level: Not on file  Occupational History  . Not on file  Social Needs  . Financial resource strain: Not on file  . Food insecurity:    Worry: Not on file    Inability: Not on file  . Transportation needs:    Medical: Not on file    Non-medical: Not on file  Tobacco Use  . Smoking status: Never Smoker  . Smokeless tobacco: Never Used  Substance and Sexual Activity  . Alcohol use: No  . Drug use: No  . Sexual activity: Yes    Birth control/protection: None  Lifestyle  . Physical activity:    Days per week: Not on file    Minutes per session: Not on file  . Stress: Not on file  Relationships  . Social connections:    Talks on phone: Not on file    Gets together: Not on file    Attends religious service: Not on file    Active member of club or organization:  Not on file    Attends meetings of clubs or organizations: Not on file    Relationship status: Not on file  Other Topics Concern  . Not on file  Social History Narrative  . Not on file        Family History  Problem Relation Age of Onset  . Hypertension Maternal Grandmother   . ADD / ADHD Neg Hx   . Alcohol abuse Neg Hx   . Anxiety disorder Neg Hx   . Arthritis Neg Hx   . Birth defects Neg Hx   . Asthma Neg Hx   . Cancer Neg Hx   . COPD Neg Hx   . Depression Neg Hx   . Diabetes Neg Hx   . Drug abuse Neg Hx   . Early death Neg Hx   . Hearing loss Neg Hx   . Heart disease Neg Hx   . Hyperlipidemia Neg Hx   . Intellectual disability Neg Hx   . Kidney disease Neg Hx   . Learning disabilities Neg Hx   . Miscarriages / Stillbirths Neg Hx   . Obesity Neg Hx   . Stroke Neg Hx   . Vision loss Neg Hx   . Varicose Veins Neg Hx    No Known Allergies          Medications Prior to Admission  Medication Sig Dispense Refill Last Dose  . methylPREDNISolone (MEDROL) 4 MG tablet Take 1 tablet (4 mg total) by mouth daily. Refer to table given by inpatient pharmacist 25 tablet 0 12/01/2017 at 0800  . pantoprazole (PROTONIX) 20 MG tablet 2 tabs daily for heartburn. 30 tablet 1 12/01/2017 at 0800  . Prenatal MV-Min-FA-Omega-3 (PRENATAL GUMMIES/DHA & FA PO) Take by mouth.   12/01/2017 at 0800   Review of Systems - Positive for vomiting, abdominal pain.  Vitals: BP 116/60  Pulse 96  Temp 98.4 F (36.9 C) (Oral)  Resp 16  Ht 5\' 8"  (1.727 m)  Wt 142 lb 14.4 oz (64.8 kg)  LMP 08/23/2017 (Approximate)  BMI 21.73 kg/m  Physical Examination:  CONSTITUTIONAL: Well-developed, well-nourished female in no acute distress.  HENT: Normocephalic, atraumatic, External right and left ear normal. Oropharynx is clear and moist  EYES: Conjunctivae and EOM are normal. Pupils are equal, round, and reactive to light. No scleral icterus.  NECK: Normal range of motion, supple, no masses  SKIN: Skin is warm and dry. No rash noted. Not diaphoretic. No erythema. No pallor.  NEUROLGIC: Alert and oriented to person, place, and time. Normal reflexes, muscle tone coordination. No cranial nerve deficit noted.  PSYCHIATRIC: Normal mood and affect. Normal behavior. Normal judgment and thought content.  CARDIOVASCULAR: Normal heart rate noted, regular rhythm  RESPIRATORY: Effort and breath sounds normal, no problems with respiration noted  ABDOMEN: Soft, nontender, nondistended, gravid.  MUSCULOSKELETAL: Normal range of motion. No edema and no tenderness. 2+ distal pulses.  Cervix: Not examined  Membranes:intact  Fetal Monitoring:NA.  Tocometer: Flat  Labs:       Results for orders placed or performed during the hospital encounter of 12/02/17 (from the past 24 hour(s))  Urinalysis, Routine w reflex microscopic   Collection Time: 12/02/17 8:12 PM  Result Value Ref Range   Color,  Urine YELLOW YELLOW   APPearance CLEAR CLEAR   Specific Gravity, Urine 1.016 1.005 - 1.030   pH 8.0 5.0 - 8.0   Glucose, UA NEGATIVE NEGATIVE mg/dL   Hgb urine dipstick NEGATIVE NEGATIVE   Bilirubin Urine NEGATIVE  NEGATIVE   Ketones, ur 80 (A) NEGATIVE mg/dL   Protein, ur 30 (A) NEGATIVE mg/dL   Nitrite NEGATIVE NEGATIVE   Leukocytes, UA NEGATIVE NEGATIVE   WBC, UA 0-5 0 - 5 WBC/hpf   Bacteria, UA NONE SEEN NONE SEEN   Squamous Epithelial / LPF 0-5 0 - 5   Trans Epithel, UA <1    Mucus PRESENT   Comprehensive metabolic panel   Collection Time: 12/02/17 9:05 PM  Result Value Ref Range   Sodium 130 (L) 135 - 145 mmol/L   Potassium 5.9 (H) 3.5 - 5.1 mmol/L   Chloride 101 98 - 111 mmol/L   CO2 18 (L) 22 - 32 mmol/L   Glucose, Bld 81 70 - 99 mg/dL   BUN <5 (L) 6 - 20 mg/dL   Creatinine, Ser 1.910.32 (L) 0.44 - 1.00 mg/dL   Calcium 8.6 (L) 8.9 - 10.3 mg/dL   Total Protein 6.8 6.5 - 8.1 g/dL   Albumin 3.9 3.5 - 5.0 g/dL   AST 65 (H) 15 - 41 U/L   ALT 31 0 - 44 U/L   Alkaline Phosphatase 53 38 - 126 U/L   Total Bilirubin 2.5 (H) 0.3 - 1.2 mg/dL   GFR calc non Af Amer >60 >60 mL/min   GFR calc Af Amer >60 >60 mL/min   Anion gap 11 5 - 15  CBC with Differential/Platelet   Collection Time: 12/02/17 9:05 PM  Result Value Ref Range   WBC 9.2 4.0 - 10.5 K/uL   RBC 4.18 3.87 - 5.11 MIL/uL   Hemoglobin 11.6 (L) 12.0 - 15.0 g/dL   HCT 47.833.7 (L) 29.536.0 - 62.146.0 %   MCV 80.6 78.0 - 100.0 fL   MCH 27.8 26.0 - 34.0 pg   MCHC 34.4 30.0 - 36.0 g/dL   RDW 30.814.9 65.711.5 - 84.615.5 %   Platelets 211 150 - 400 K/uL   Neutrophils Relative % 74 %   Neutro Abs 6.8 1.7 - 7.7 K/uL   Lymphocytes Relative 18 %   Lymphs Abs 1.7 0.7 - 4.0 K/uL   Monocytes Relative 8 %   Monocytes Absolute 0.7 0.1 - 1.0 K/uL   Eosinophils Relative 0 %   Eosinophils Absolute 0.0 0.0 - 0.7 K/uL   Basophils Relative 0 %   Basophils Absolute 0.0 0.0 - 0.1 K/uL   Imaging Studies:  Imaging Results     Assessment and Plan:       Patient Active Problem List   Diagnosis Date Noted  . Hypomagnesemia 11/28/2017  . Hypokalemia due to loss of potassium 11/28/2017  . Hyperemesis 11/24/2017  . Supervision of normal pregnancy 11/08/2017   Admit to Antenatal  Routine antenatal care  1. FHR q shift  2. Continue predisone, protonix, compazine, meclizine and Zofran.  3. Dubhoff tube placement  4. Continue IV LR  5. Abdominal US  Marylene LandKathryn Lorraine Kooistra, CNM  I examined the patient this morning. Her steroid taper was restarted and we will continue to replete potassium.  She felt well and tolerated a diet this afternoon and requested discharge home. She will finish her steroid taper and I added K+ supplement.    Consults: None  Significant Diagnostic Studies: radiology: Ultrasound:   CLINICAL DATA:  Chronic vomiting.  EXAM: ABDOMEN ULTRASOUND COMPLETE  COMPARISON:  None.  FINDINGS: Gallbladder: Mild echogenic sludge and stones are seen within the gallbladder. No gallbladder wall thickening or pericholecystic fluid is seen. No ultrasonographic Murphy's sign is elicited.  Common bile duct: Diameter:  0.2 cm, within normal limits in caliber.  Liver: No focal lesion identified. Within normal limits in parenchymal echogenicity. Portal vein is patent on color Doppler imaging with normal direction of blood flow towards the liver.  IVC: No abnormality visualized.  Pancreas: Visualized portion unremarkable.  Spleen: Size and appearance within normal limits. A simple 1.4 cm cyst is noted within the spleen.  Right Kidney: Length: 13.1 cm. Echogenicity within normal limits. No mass or hydronephrosis visualized.  Left Kidney: Length: 11.7 cm. Echogenicity within normal limits. No mass or hydronephrosis visualized.  Abdominal aorta: No aneurysm visualized.  Other findings: None.  IMPRESSION: 1. No acute abnormality seen to explain the patient's symptoms. 2. Mild echogenic sludge and stones  within the gallbladder. No evidence for cholecystitis. 3. Small splenic cyst noted.   Electronically Signed   By: Roanna Raider M.D.   On: 12/03/2017 00:32 Treatments: IV hydration and antiemetics  Discharge Exam: Blood pressure 122/77, pulse 90, temperature 98.4 F (36.9 C), temperature source Oral, resp. rate 18, height 5\' 8"  (1.727 m), weight 68 kg (150 lb 0.1 oz), last menstrual period 08/23/2017, SpO2 100 %, unknown if currently breastfeeding. General appearance: alert, cooperative and no distress GI: soft, non-tender; bowel sounds normal; no masses,  no organomegaly Extremities: extremities normal, atraumatic, no cyanosis or edema  Disposition: Discharge disposition: 01-Home or Self Care        Allergies as of 12/03/2017   No Known Allergies     Medication List    TAKE these medications   methylPREDNISolone 4 MG tablet Commonly known as:  MEDROL Take 1 tablet (4 mg total) by mouth daily. Refer to table given by inpatient pharmacist   ondansetron 8 MG tablet Commonly known as:  ZOFRAN Take 8 mg by mouth every 8 (eight) hours as needed for nausea or vomiting.   pantoprazole 20 MG tablet Commonly known as:  PROTONIX 2 tabs daily for heartburn. What changed:    how much to take  how to take this  when to take this  additional instructions   potassium chloride SA 20 MEQ tablet Commonly known as:  K-DUR,KLOR-CON Take 2 tablets (40 mEq total) by mouth daily.   PRENATAL GUMMIES/DHA & FA PO Take 2 tablets by mouth daily.      Follow-up Information    [facility] Follow up in 2 week(s).   Specialty:  Obstetrics and Gynecology Contact information: [address] [place] [address] [phone]          Signed: Scheryl Darter 12/03/2017, 2:00 PM

## 2017-12-03 NOTE — H&P (Signed)
ANTEPARTUM ADMISSION HISTORY AND PHYSICAL NOTE   History of Present Illness: Deborah Evans is a 22 y.o. G2P0010 at [redacted]w[redacted]d admitted for hyperemesis gravidarum. Patient has had multiple MAU visits and one Antepartum Admission. Was discharged home on 8/1 with RX for protonix, Steroid taper, compazine, meclizine and reglan. Patient was seen in MAU on 8-3 and 8-4; after discussion with Dr. Erin Fulling patient to be admitted.    Patient reports the fetal movement as NA. Marland Kitchen Patient reports uterine contraction  activity as none.  Patient reports  vaginal bleeding as none. Fetal presentation is unsure and NA. Marland Kitchen      Patient Active Problem List   Diagnosis Date Noted  . Hypomagnesemia 11/28/2017  . Hypokalemia due to loss of potassium 11/28/2017  . Hyperemesis 11/24/2017  . Supervision of normal pregnancy 11/08/2017        Past Medical History:  Diagnosis Date  . Medical history non-contributory          Past Surgical History:  Procedure Laterality Date  . DILATION AND CURETTAGE OF UTERUS N/A 07/15/2014   Procedure: SUCTION DILATATION AND CURETTAGE;  Surgeon: Lazaro Arms, MD;  Location: [facility];  Service: Gynecology;  Laterality: N/A;                    OB History  Gravida Para Term Preterm AB Living  2       1 0  SAB TAB Ectopic Multiple Live Births     1               # Outcome Date GA Lbr Len/2nd Weight Sex Delivery Anes PTL Lv  2 Current           1 SAB             Social History        Socioeconomic History  . Marital status: Single    Spouse name: Not on file  . Number of children: Not on file  . Years of education: Not on file  . Highest education level: Not on file  Occupational History  . Not on file  Social Needs  . Financial resource strain: Not on file  . Food insecurity:    Worry: Not on file    Inability: Not on file  . Transportation needs:    Medical: Not on file    Non-medical: Not on file  Tobacco  Use  . Smoking status: Never Smoker  . Smokeless tobacco: Never Used  Substance and Sexual Activity  . Alcohol use: No  . Drug use: No  . Sexual activity: Yes    Birth control/protection: None  Lifestyle  . Physical activity:    Days per week: Not on file    Minutes per session: Not on file  . Stress: Not on file  Relationships  . Social connections:    Talks on phone: Not on file    Gets together: Not on file    Attends religious service: Not on file    Active member of club or organization: Not on file    Attends meetings of clubs or organizations: Not on file    Relationship status: Not on file  Other Topics Concern  . Not on file  Social History Narrative  . Not on file         Family History  Problem Relation Age of Onset  . Hypertension Maternal Grandmother   . ADD / ADHD Neg Hx   . Alcohol  abuse Neg Hx   . Anxiety disorder Neg Hx   . Arthritis Neg Hx   . Birth defects Neg Hx   . Asthma Neg Hx   . Cancer Neg Hx   . COPD Neg Hx   . Depression Neg Hx   . Diabetes Neg Hx   . Drug abuse Neg Hx   . Early death Neg Hx   . Hearing loss Neg Hx   . Heart disease Neg Hx   . Hyperlipidemia Neg Hx   . Intellectual disability Neg Hx   . Kidney disease Neg Hx   . Learning disabilities Neg Hx   . Miscarriages / Stillbirths Neg Hx   . Obesity Neg Hx   . Stroke Neg Hx   . Vision loss Neg Hx   . Varicose Veins Neg Hx     No Known Allergies         Medications Prior to Admission  Medication Sig Dispense Refill Last Dose  . methylPREDNISolone (MEDROL) 4 MG tablet Take 1 tablet (4 mg total) by mouth daily. Refer to table given by inpatient pharmacist 25 tablet 0 12/01/2017 at 0800  . pantoprazole (PROTONIX) 20 MG tablet 2 tabs daily for heartburn. 30 tablet 1 12/01/2017 at 0800  . Prenatal MV-Min-FA-Omega-3 (PRENATAL GUMMIES/DHA & FA PO) Take by mouth.   12/01/2017 at 0800    Review of Systems - Positive for vomiting,  abdominal pain.  Vitals:  BP 116/60   Pulse 96   Temp 98.4 F (36.9 C) (Oral)   Resp 16   Ht 5\' 8"  (1.727 m)   Wt 142 lb 14.4 oz (64.8 kg)   LMP 08/23/2017 (Approximate)   BMI 21.73 kg/m  Physical Examination: CONSTITUTIONAL: Well-developed, well-nourished female in no acute distress.  HENT:  Normocephalic, atraumatic, External right and left ear normal. Oropharynx is clear and moist EYES: Conjunctivae and EOM are normal. Pupils are equal, round, and reactive to light. No scleral icterus.  NECK: Normal range of motion, supple, no masses SKIN: Skin is warm and dry. No rash noted. Not diaphoretic. No erythema. No pallor. NEUROLGIC: Alert and oriented to person, place, and time. Normal reflexes, muscle tone coordination. No cranial nerve deficit noted. PSYCHIATRIC: Normal mood and affect. Normal behavior. Normal judgment and thought content. CARDIOVASCULAR: Normal heart rate noted, regular rhythm RESPIRATORY: Effort and breath sounds normal, no problems with respiration noted ABDOMEN: Soft, nontender, nondistended, gravid. MUSCULOSKELETAL: Normal range of motion. No edema and no tenderness. 2+ distal pulses.  Cervix: Not examined  Membranes:intact Fetal Monitoring:NA.  Tocometer: Flat  Labs:       Results for orders placed or performed during the hospital encounter of 12/02/17 (from the past 24 hour(s))  Urinalysis, Routine w reflex microscopic   Collection Time: 12/02/17  8:12 PM  Result Value Ref Range   Color, Urine YELLOW YELLOW   APPearance CLEAR CLEAR   Specific Gravity, Urine 1.016 1.005 - 1.030   pH 8.0 5.0 - 8.0   Glucose, UA NEGATIVE NEGATIVE mg/dL   Hgb urine dipstick NEGATIVE NEGATIVE   Bilirubin Urine NEGATIVE NEGATIVE   Ketones, ur 80 (A) NEGATIVE mg/dL   Protein, ur 30 (A) NEGATIVE mg/dL   Nitrite NEGATIVE NEGATIVE   Leukocytes, UA NEGATIVE NEGATIVE   WBC, UA 0-5 0 - 5 WBC/hpf   Bacteria, UA NONE SEEN NONE SEEN   Squamous Epithelial / LPF  0-5 0 - 5   Trans Epithel, UA <1    Mucus PRESENT   Comprehensive metabolic panel  Collection Time: 12/02/17  9:05 PM  Result Value Ref Range   Sodium 130 (L) 135 - 145 mmol/L   Potassium 5.9 (H) 3.5 - 5.1 mmol/L   Chloride 101 98 - 111 mmol/L   CO2 18 (L) 22 - 32 mmol/L   Glucose, Bld 81 70 - 99 mg/dL   BUN <5 (L) 6 - 20 mg/dL   Creatinine, Ser 1.610.32 (L) 0.44 - 1.00 mg/dL   Calcium 8.6 (L) 8.9 - 10.3 mg/dL   Total Protein 6.8 6.5 - 8.1 g/dL   Albumin 3.9 3.5 - 5.0 g/dL   AST 65 (H) 15 - 41 U/L   ALT 31 0 - 44 U/L   Alkaline Phosphatase 53 38 - 126 U/L   Total Bilirubin 2.5 (H) 0.3 - 1.2 mg/dL   GFR calc non Af Amer >60 >60 mL/min   GFR calc Af Amer >60 >60 mL/min   Anion gap 11 5 - 15  CBC with Differential/Platelet   Collection Time: 12/02/17  9:05 PM  Result Value Ref Range   WBC 9.2 4.0 - 10.5 K/uL   RBC 4.18 3.87 - 5.11 MIL/uL   Hemoglobin 11.6 (L) 12.0 - 15.0 g/dL   HCT 09.633.7 (L) 04.536.0 - 40.946.0 %   MCV 80.6 78.0 - 100.0 fL   MCH 27.8 26.0 - 34.0 pg   MCHC 34.4 30.0 - 36.0 g/dL   RDW 81.114.9 91.411.5 - 78.215.5 %   Platelets 211 150 - 400 K/uL   Neutrophils Relative % 74 %   Neutro Abs 6.8 1.7 - 7.7 K/uL   Lymphocytes Relative 18 %   Lymphs Abs 1.7 0.7 - 4.0 K/uL   Monocytes Relative 8 %   Monocytes Absolute 0.7 0.1 - 1.0 K/uL   Eosinophils Relative 0 %   Eosinophils Absolute 0.0 0.0 - 0.7 K/uL   Basophils Relative 0 %   Basophils Absolute 0.0 0.0 - 0.1 K/uL    Imaging Studies: ImagingResults  No results found.     Assessment and Plan:     Patient Active Problem List   Diagnosis Date Noted  . Hypomagnesemia 11/28/2017  . Hypokalemia due to loss of potassium 11/28/2017  . Hyperemesis 11/24/2017  . Supervision of normal pregnancy 11/08/2017   Admit to Antenatal Routine antenatal care  1. FHR q shift 2. Continue predisone, protonix, compazine, meclizine and Zofran.  3. Dubhoff tube placement 4. Continue IV LR   5. Abdominal US    Marylene LandKathryn Lorraine Kooistra, CNM  Attestation of Attending Supervision of Advanced Practitioner (CNM/NP/PA): Evaluation and management procedures were performed by the Advanced Practitioner under my supervision and collaboration. I have reviewed the Advanced Practitioner's note and chart, and I agree with the management and plan. I examined the patient this morning. Her steroid taper was restarted and we will continue to replete potassium. I will schedule anatomy US.  Scheryl DarterJames Helene Bernstein MD 12/03/2017 9:41 AM

## 2017-12-03 NOTE — Discharge Instructions (Signed)
Morning Sickness °Morning sickness is when you feel sick to your stomach (nauseous) during pregnancy. This nauseous feeling may or may not come with vomiting. It often occurs in the morning but can be a problem any time of day. Morning sickness is most common during the first trimester, but it may continue throughout pregnancy. While morning sickness is unpleasant, it is usually harmless unless you develop severe and continual vomiting (hyperemesis gravidarum). This condition requires more intense treatment. °What are the causes? °The cause of morning sickness is not completely known but seems to be related to normal hormonal changes that occur in pregnancy. °What increases the risk? °You are at greater risk if you: °· Experienced nausea or vomiting before your pregnancy. °· Had morning sickness during a previous pregnancy. °· Are pregnant with more than one baby, such as twins. ° °How is this treated? °Do not use any medicines (prescription, over-the-counter, or herbal) for morning sickness without first talking to your health care provider. Your health care provider may prescribe or recommend: °· Vitamin B6 supplements. °· Anti-nausea medicines. °· The herbal medicine ginger. ° °Follow these instructions at home: °· Only take over-the-counter or prescription medicines as directed by your health care provider. °· Taking multivitamins before getting pregnant can prevent or decrease the severity of morning sickness in most women. °· Eat a piece of dry toast or unsalted crackers before getting out of bed in the morning. °· Eat five or six small meals a day. °· Eat dry and bland foods (rice, baked potato). Foods high in carbohydrates are often helpful. °· Do not drink liquids with your meals. Drink liquids between meals. °· Avoid greasy, fatty, and spicy foods. °· Get someone to cook for you if the smell of any food causes nausea and vomiting. °· If you feel nauseous after taking prenatal vitamins, take the vitamins at  night or with a snack. °· Snack on protein foods (nuts, yogurt, cheese) between meals if you are hungry. °· Eat unsweetened gelatins for desserts. °· Wearing an acupressure wristband (worn for sea sickness) may be helpful. °· Acupuncture may be helpful. °· Do not smoke. °· Get a humidifier to keep the air in your house free of odors. °· Get plenty of fresh air. °Contact a health care provider if: °· Your home remedies are not working, and you need medicine. °· You feel dizzy or lightheaded. °· You are losing weight. °Get help right away if: °· You have persistent and uncontrolled nausea and vomiting. °· You pass out (faint). °This information is not intended to replace advice given to you by your health care provider. Make sure you discuss any questions you have with your health care provider. °Document Released: 06/08/2006 Document Revised: 09/23/2015 Document Reviewed: 10/02/2012 °Elsevier Interactive Patient Education © 2017 [employer] ° °

## 2017-12-03 NOTE — Progress Notes (Signed)
Pharmacy Consult:   MEDROL (METHYLPREDNISOLONE) TAPER  FOR HYPEREMESIS GRAVIDARUM PATIENTS  The following is a 14 day taper of methylprednisolone for hyperemesis.   All doses will be given PO. (If patient cannot tolerate oral medications, contact the pharmacy to change route to IV.)   Date Day Morning Midday Bedtime  12/03/17 1 16mg  16 mg 16 mg  12/04/17 2 16  mg 16 mg 16 mg  12/05/17 3 16  mg 16 mg 16 mg  12/06/17 4 16  mg 8 mg 16 mg  12/07/17 5 16  mg 8 mg 8 mg  12/08/17 6 8  mg 8 mg 8 mg  12/09/17 7 8  mg 4 mg 8 mg  12/10/17 8 8  mg 4 mg 4 mg  12/11/17 9 8  mg 4 mg   12/12/17 10 8  mg 4 mg   12/13/17 11 8  mg    12/14/17 12 8  mg    12/15/17 13 4  mg    12/16/17 14 4  mg     Check fasting blood sugars daily while on the taper. Notify MD if fasting blood sugar>95.  Sherrilyn RistKaren W Treysen Sudbeck 12/03/2017

## 2017-12-09 ENCOUNTER — Emergency Department (HOSPITAL_COMMUNITY)
Admission: EM | Admit: 2017-12-09 | Discharge: 2017-12-09 | Disposition: A | Discharge status: Home / Self Care | Payer: Medicaid Other | Attending: Emergency Medicine | Admitting: Emergency Medicine

## 2017-12-09 ENCOUNTER — Encounter (HOSPITAL_COMMUNITY): Payer: Self-pay | Admitting: Emergency Medicine

## 2017-12-09 DIAGNOSIS — E162 Hypoglycemia, unspecified: Secondary | ICD-10-CM | POA: Diagnosis not present

## 2017-12-09 DIAGNOSIS — Z3A20 20 weeks gestation of pregnancy: Secondary | ICD-10-CM | POA: Diagnosis not present

## 2017-12-09 DIAGNOSIS — E860 Dehydration: Secondary | ICD-10-CM | POA: Diagnosis not present

## 2017-12-09 DIAGNOSIS — O039 Complete or unspecified spontaneous abortion without complication: Secondary | ICD-10-CM | POA: Diagnosis not present

## 2017-12-09 DIAGNOSIS — Z79899 Other long term (current) drug therapy: Secondary | ICD-10-CM | POA: Diagnosis not present

## 2017-12-09 DIAGNOSIS — E161 Other hypoglycemia: Secondary | ICD-10-CM | POA: Diagnosis not present

## 2017-12-09 DIAGNOSIS — O211 Hyperemesis gravidarum with metabolic disturbance: Secondary | ICD-10-CM | POA: Diagnosis not present

## 2017-12-09 DIAGNOSIS — R000 Tachycardia, unspecified: Secondary | ICD-10-CM | POA: Diagnosis not present

## 2017-12-09 DIAGNOSIS — O210 Mild hyperemesis gravidarum: Secondary | ICD-10-CM

## 2017-12-09 LAB — CBC WITH DIFFERENTIAL/PLATELET
Basophils Absolute: 0 10*3/uL (ref 0.0–0.1)
Basophils Relative: 0 %
Eosinophils Absolute: 0 10*3/uL (ref 0.0–0.7)
Eosinophils Relative: 1 %
HEMATOCRIT: 37.6 % (ref 36.0–46.0)
Hemoglobin: 12.4 g/dL (ref 12.0–15.0)
Lymphocytes Relative: 24 %
Lymphs Abs: 1.6 10*3/uL (ref 0.7–4.0)
MCH: 27.3 pg (ref 26.0–34.0)
MCHC: 33 g/dL (ref 30.0–36.0)
MCV: 82.6 fL (ref 78.0–100.0)
MONO ABS: 0.6 10*3/uL (ref 0.1–1.0)
Monocytes Relative: 9 %
NEUTROS ABS: 4.3 10*3/uL (ref 1.7–7.7)
Neutrophils Relative %: 66 %
Platelets: 191 10*3/uL (ref 150–400)
RBC: 4.55 MIL/uL (ref 3.87–5.11)
RDW: 14.4 % (ref 11.5–15.5)
WBC: 6.5 10*3/uL (ref 4.0–10.5)

## 2017-12-09 LAB — URINALYSIS, ROUTINE W REFLEX MICROSCOPIC
Bilirubin Urine: NEGATIVE
GLUCOSE, UA: NEGATIVE mg/dL
Hgb urine dipstick: NEGATIVE
KETONES UR: 80 mg/dL — AB
Leukocytes, UA: NEGATIVE
Nitrite: NEGATIVE
PROTEIN: 100 mg/dL — AB
Specific Gravity, Urine: 1.024 (ref 1.005–1.030)
pH: 7 (ref 5.0–8.0)

## 2017-12-09 LAB — BASIC METABOLIC PANEL
Anion gap: 11 (ref 5–15)
BUN: 7 mg/dL (ref 6–20)
CHLORIDE: 101 mmol/L (ref 98–111)
CO2: 23 mmol/L (ref 22–32)
CREATININE: 0.49 mg/dL (ref 0.44–1.00)
Calcium: 9.5 mg/dL (ref 8.9–10.3)
GFR calc Af Amer: >60 mL/min (ref >60)
GFR calc non Af Amer: >60 mL/min (ref >60)
GLUCOSE: 89 mg/dL (ref 70–99)
Potassium: 4.2 mmol/L (ref 3.5–5.1)
SODIUM: 135 mmol/L (ref 135–145)

## 2017-12-09 MED ORDER — PROMETHAZINE HCL 25 MG PO TABS: 25.0000 mg | ORAL_TABLET | Freq: Four times a day (QID) | ORAL | 0 refills | Status: DC | PRN

## 2017-12-09 MED ORDER — SODIUM CHLORIDE 0.9 % IV BOLUS
1000.0000 mL | Freq: Once | INTRAVENOUS | Status: AC
  Administered 2017-12-09: 1000 mL via INTRAVENOUS

## 2017-12-09 MED ORDER — ONDANSETRON HCL 4 MG/2ML IJ SOLN
4.0000 mg | Freq: Once | INTRAMUSCULAR | Status: AC
  Administered 2017-12-09: 4 mg via INTRAVENOUS
  Filled 2017-12-09: qty 2

## 2017-12-09 MED ORDER — PROMETHAZINE HCL 25 MG/ML IJ SOLN
12.5000 mg | Freq: Once | INTRAMUSCULAR | Status: AC
  Administered 2017-12-09: 12.5 mg via INTRAVENOUS
  Filled 2017-12-09: qty 1

## 2017-12-09 NOTE — ED Provider Notes (Signed)
[facility] EMERGENCY DEPARTMENT Provider Note   CSN: [id number] Arrival date & time: 12/09/17  1726     History   Chief Complaint Chief Complaint  Patient presents with  . Emesis During Pregnancy    HPI Deborah Evans is a 22 y.o. female.  G2 P0 Ab1 approximately [redacted] weeks pregnant presents with persistent nausea and vomiting since Saturday night at 9 PM.  She has had multiple MAU admissions for hyperemesis gravidarum.  She has been taking Zofran with minimal success.  Severity of symptoms is moderate.  Nothing makes symptoms better or worse.  No fever, sweats, chills, dysuria, vaginal bleeding, vaginal discharge.     Past Medical History:  Diagnosis Date  . Medical history non-contributory     Patient Active Problem List   Diagnosis Date Noted  . Hyperemesis affecting pregnancy, antepartum 12/03/2017  . Hypomagnesemia 11/28/2017  . Hypokalemia due to loss of potassium 11/28/2017  . Hyperemesis 11/24/2017  . Supervision of normal pregnancy 11/08/2017    Past Surgical History:  Procedure Laterality Date  . DILATION AND CURETTAGE OF UTERUS N/A 07/15/2014   Procedure: SUCTION DILATATION AND CURETTAGE;  Surgeon: Lazaro ArmsLuther H Eure, MD;  Location: [facility];  Service: Gynecology;  Laterality: N/A;     OB History    Gravida  2   Para      Term      Preterm      AB  1   Living  0     SAB  1   TAB      Ectopic      Multiple      Live Births               Home Medications    Prior to Admission medications   Medication Sig Start Date End Date Taking? Authorizing Provider  ondansetron (ZOFRAN) 8 MG tablet Take 8 mg by mouth every 8 (eight) hours as needed for nausea or vomiting.   Yes [provider]  pantoprazole (PROTONIX) 20 MG tablet 2 tabs daily for heartburn. Patient taking differently: Take 20 mg by mouth 2 (two) times daily.  10/30/17  Yes Ivery QualeBryant, Hobson, PA-C  potassium chloride SA (K-DUR,KLOR-CON) 20 MEQ tablet Take 2 tablets (40 mEq  total) by mouth daily. 12/03/17  Yes Adam PhenixArnold, James G, MD  Prenatal MV-Min-FA-Omega-3 (PRENATAL GUMMIES/DHA & FA PO) Take 2 tablets by mouth daily.    Yes [provider]  methylPREDNISolone (MEDROL) 4 MG tablet Take 1 tablet (4 mg total) by mouth daily. Refer to table given by inpatient pharmacist 11/29/17   Levie HeritageStinson, Jacob J, DO  promethazine (PHENERGAN) 25 MG tablet Take 1 tablet (25 mg total) by mouth every 6 (six) hours as needed. 12/09/17   Donnetta Hutchingook, Alleen Kehm, MD    Family History Family History  Problem Relation Age of Onset  . Hypertension Maternal Grandmother   . ADD / ADHD Neg Hx   . Alcohol abuse Neg Hx   . Anxiety disorder Neg Hx   . Arthritis Neg Hx   . Birth defects Neg Hx   . Asthma Neg Hx   . Cancer Neg Hx   . COPD Neg Hx   . Depression Neg Hx   . Diabetes Neg Hx   . Drug abuse Neg Hx   . Early death Neg Hx   . Hearing loss Neg Hx   . Heart disease Neg Hx   . Hyperlipidemia Neg Hx   . Intellectual disability Neg Hx   .  Kidney disease Neg Hx   . Learning disabilities Neg Hx   . Miscarriages / Stillbirths Neg Hx   . Obesity Neg Hx   . Stroke Neg Hx   . Vision loss Neg Hx   . Varicose Veins Neg Hx     Social History Social History   Tobacco Use  . Smoking status: Never Smoker  . Smokeless tobacco: Never Used  Substance Use Topics  . Alcohol use: No  . Drug use: No     Allergies   Patient has no known allergies.   Review of Systems Review of Systems  All other systems reviewed and are negative.    Physical Exam Updated Vital Signs BP 111/66   Pulse 84   Temp (!) 95.4 F (35.2 C) (Oral)   Resp 18   Ht 5\' 8"  (1.727 m)   Wt 68 kg   LMP 08/23/2017 (Approximate)   SpO2 100%   BMI 22.79 kg/m   Physical Exam  Constitutional: She is oriented to person, place, and time.  Nontoxic-appearing, dry mucous membranes.  HENT:  Head: Normocephalic and atraumatic.  Eyes: Conjunctivae are normal.  Neck: Neck supple.  Cardiovascular: Normal rate and  regular rhythm.  Pulmonary/Chest: Effort normal and breath sounds normal.  Abdominal: Soft. Bowel sounds are normal.  Musculoskeletal: Normal range of motion.  Neurological: She is alert and oriented to person, place, and time.  Skin: Skin is warm and dry.  Psychiatric: She has a normal mood and affect. Her behavior is normal.  Nursing note and vitals reviewed.    ED Treatments / Results  Labs (all labs ordered are listed, but only abnormal results are displayed) Labs Reviewed  URINALYSIS, ROUTINE W REFLEX MICROSCOPIC - Abnormal; Notable for the following components:      Result Value   APPearance HAZY (*)    Ketones, ur 80 (*)    Protein, ur 100 (*)    Bacteria, UA RARE (*)    All other components within normal limits  CBC WITH DIFFERENTIAL/PLATELET  BASIC METABOLIC PANEL    EKG None  Radiology No results found.  Procedures Procedures (including critical care time)  Medications Ordered in ED Medications  ondansetron (ZOFRAN) injection 4 mg (4 mg Intravenous Given 12/09/17 1818)  sodium chloride 0.9 % bolus 1,000 mL (0 mLs Intravenous Stopped 12/09/17 1957)  sodium chloride 0.9 % bolus 1,000 mL (0 mLs Intravenous Stopped 12/09/17 1957)  sodium chloride 0.9 % bolus 1,000 mL (0 mLs Intravenous Stopped 12/09/17 2206)  promethazine (PHENERGAN) injection 12.5 mg (12.5 mg Intravenous Given 12/09/17 2051)     Initial Impression / Assessment and Plan / ED Course  I have reviewed the triage vital signs and the nursing notes.  Pertinent labs & imaging results that were available during my care of the patient were reviewed by me and considered in my medical decision making (see chart for details).     History and physical most consistent with hyperemesis gravidarum.  Labs are stable.  Patient responded well to 3 L of IV fluid, IV Zofran, IV Phenergan.  I will discharge her on oral Phenergan since she is done poorly with oral Zofran.  She has ob gyn follow-up  Final Clinical  Impressions(s) / ED Diagnoses   Final diagnoses:  Hyperemesis gravidarum    ED Discharge Orders         Ordered    promethazine (PHENERGAN) 25 MG tablet  Every 6 hours PRN,   Status:  Discontinued     12/09/17  2211    promethazine (PHENERGAN) 25 MG tablet  Every 6 hours PRN     12/09/17 2214           Donnetta Hutching, MD 12/09/17 2245

## 2017-12-09 NOTE — Discharge Instructions (Addendum)
Prescription for nausea medication.  Follow-up with your obstetrician.

## 2017-12-09 NOTE — ED Triage Notes (Signed)
Pt reports multiple visits/admit to MAU for hyperemesis and hypokalemia.  Vomiting since last night.  Denies vag bleeding or abd pain.

## 2017-12-14 ENCOUNTER — Other Ambulatory Visit: Payer: Self-pay

## 2017-12-14 ENCOUNTER — Encounter: Payer: Self-pay | Admitting: Obstetrics and Gynecology

## 2017-12-14 ENCOUNTER — Ambulatory Visit (INDEPENDENT_AMBULATORY_CARE_PROVIDER_SITE_OTHER): Payer: BLUE CROSS/BLUE SHIELD | Admitting: Obstetrics and Gynecology

## 2017-12-14 VITALS — BP 122/77 | HR 102 | Wt 140.0 lb

## 2017-12-14 DIAGNOSIS — Z3482 Encounter for supervision of other normal pregnancy, second trimester: Secondary | ICD-10-CM

## 2017-12-14 DIAGNOSIS — Z3A21 21 weeks gestation of pregnancy: Secondary | ICD-10-CM

## 2017-12-14 DIAGNOSIS — Z331 Pregnant state, incidental: Secondary | ICD-10-CM

## 2017-12-14 DIAGNOSIS — Z1389 Encounter for screening for other disorder: Secondary | ICD-10-CM

## 2017-12-14 LAB — POCT URINALYSIS DIPSTICK OB
Blood, UA: NEGATIVE
Glucose, UA: NEGATIVE — AB
LEUKOCYTES UA: NEGATIVE
Nitrite, UA: NEGATIVE

## 2017-12-14 MED ORDER — ONDANSETRON HCL 8 MG PO TABS: 8.0000 mg | ORAL_TABLET | Freq: Three times a day (TID) | ORAL | 3 refills | Status: DC | PRN

## 2017-12-14 MED ORDER — PANTOPRAZOLE SODIUM 20 MG PO TBEC: DELAYED_RELEASE_TABLET | ORAL | 1 refills | Status: DC

## 2017-12-14 NOTE — Patient Instructions (Signed)
CHILDBIRTH CLASSES ° °[facility] °Call to Register: [phone] or [phone] or Register Online: [email or web address] ° °THESE CLASSES FILL UP VERY QUICKLY, SO SIGN UP AS SOON AS YOU CAN!!! ° °*Please visit [facility] pregnancy website at [email or web address]* ° °Option 1: Birth & Baby Series °• This series of 3 weekly classes helps you and your labor partner prepare for childbirth at [facility]. °• Reviews newborn care, labor & birth, pain management, and comfort techniques °• Maternity Care Center Tour of [facility] is included. °• Cost: $60 per couple for insured or self-pay, $30 per couple for Medicaid ° °Option 2: Weekend Birth & Baby °• This class is a weekend version of our Birth & Baby series. It is designed for parents who have a difficult time fitting several weeks of classes into their schedule.  °• Maternity Care Center Tour of [facility] is included.  °• Friday 6:30pm-8:30pm, Saturday 9am-4pm °• Cost: $75 per couple for insured or self-pay, $30 per couple for Medicaid ° °Option 3: Natural Childbirth °• This series of 5 weekly classes is for expectant parents who want to learn and practice natural methods of coping with the process of labor and childbirth. °• Maternity Care Center Tour of [facility] is included. °• Cost: $75 per couple for insured or self-pay, $30 per couple for Medicaid ° °Option 4: Online Birth & Baby °• This online class offers you the freedom to complete a Birth & Baby series in the comfort of your own home. The flexibility of this option allows you to review sections at your own place, at times convenient to you and your support people.  °• Cost: $60 for 60 days of online access ° ° ° °Other Available Classes °Baby & Me °Enjoy this time to discuss newborn & infant parenting topics and family adjustment issues with other new mothers in a relaxed environment. Each week brings a new speaker or baby-centered activity. We encourage  mothers and their babies (birth to crawling) to join us every Thursday in the [facility] at 11:00 am. You are welcome to visit this group even if you haven't delivered yet! It's wonderful to make new friends early and watch other moms interact with their babies. No registration or fee.  ° °Big Brother/ Big Sister °Let your children share in the joy of a new brother or sister in this special class designed just for them. This class is designed for children ages 2-6, but any age is welcome. Please register each child individually. ° °Breastfeeding Support Group °This group is a mother-to-mother support circle where moms have the opportunity to share their breastfeeding experiences. A breastfeeding Support nurse is present for questions and concerns. Meets each Tuesday at 11:00 am. No fee or registration. ° °Breastfeeding Your Baby °Breastfeeding is a special time for mother and child. This class will help you feel ready to begin this important relationship. Your partner is encouraged to attend with you.  ° °Caring For Baby °This class is for expectant  and adoptive parents who want to learn and practice the most up-to-date newborn care for their babies. Register only the mom-to-be and your partner can come with you. (Note: This class is included in the Birth & Baby series and the Weekend Birth & Baby classes.) ° °Comfort Techniques & Tour °This 2-hour interactive class will provide you the opportunity to learn & practice hands-on techniques with your partner that can help relieve some discomfort of labor and encourage your baby to rotate toward   the best position for birth. A tour of the [facility] Care Center is included.  ° °Daddy Boot Camp °This course offers Dads-to-be the tools and knowledge needed to feel confident on their journey to becoming new fathers. ° °Grandparent Love °Expecting a grandbaby? Learn about the latest infant care and safety recommendation and ways to  support your own child as he or she transitions into the parenting role.  ° °Infant and Child CPR °Parents, grandparents, babysitters, and friends learn Cardio-Pulmonary Resuscitation skills for infants and children. Register each participant individually. (Note: This Family & Friends program does not offer certification.) ° °Marvelous Multiples °Expecting twins, triplets, or more? This class covers the differences in labor, birth, parenting, and breastfeeding issues that face multiples' parents. NICU tour is included.  ° °Mom Talk °This mom-led group offers support and connection to mothers as they journey through the adjustments and struggles of that sometimes overwhelming first year after the birth of a child. A member of our [facility] staff will be present to share resources and additional support if needed, as you care for yourself and baby. You are welcome to visit the group before you deliver! It's wonderful to meet new friends early and watch other moms interact with their babies. It's held at [facility] at 10:00 am each Tuesday morning and 6:00 pm each Thursday evening. Babies (birth to crawling) welcome. No registration or fee.  ° °Waterbirth Classes °Interested in a waterbirth? This informational class will help you discover whether waterbirth is the right fir for you.  ° °[facility] Virtual Maternity Tour °View a virtual tour of [facility]. In-person tours are available for participants of childbirth education classes.  ° °

## 2017-12-14 NOTE — Progress Notes (Signed)
Patient ID: Deborah Evans, female   DOB: [birth year], 22 y.o.   MRN: [id number]    LOW-RISK PREGNANCY VISIT Patient name: Deborah Evans MRN [id number]  Date of birth: [birth year] Chief Complaint:   Routine Prenatal Visit  History of Present Illness:   Deborah Evans is a 22 y.o. 512P0010 female at 1324w3d with an Estimated Date of Delivery: 04/23/18 being seen today for ongoing management of a low-risk pregnancy.   [facility]. Today she reports that she still cannot keep any food down and she has lost 6 lbs since her last visit. She thinks that the Zofran is helping. She is considering childbirth classes and the father of the baby is also very excited. Her first pregnancy ended in a miscarriage.   Contractions: Not present. Vag. Bleeding: None.  Movement: Present. denies leaking of fluid. Review of Systems:   Pertinent items are noted in HPI Denies abnormal vaginal discharge w/ itching/odor/irritation, headaches, visual changes, shortness of breath, chest pain, abdominal pain, severe nausea/vomiting, or problems with urination or bowel movements unless otherwise stated above. Pertinent History Reviewed:  Reviewed past medical,surgical, social, obstetrical and family history.  Reviewed problem list, medications and allergies. Physical Assessment:   Vitals:   12/14/17 0901  BP: 122/77  Pulse: (!) 102  Weight: 140 lb (63.5 kg)  Body mass index is 21.29 kg/m.        Physical Examination:   General appearance: Well appearing, and in no distress  Mental status: Alert, oriented to person, place, and time  Skin: Warm & dry  Cardiovascular: Normal heart rate noted  Respiratory: Normal respiratory effort, no distress  Abdomen: Soft, gravid, nontender  Pelvic: Cervical exam deferred         Extremities: Edema: None  Fetal Status: Fetal Heart Rate (bpm): 153 Fundal Height: 18 cm Movement: Present    Results for orders placed or performed in visit on  12/14/17 (from the past 24 hour(s))  POC Urinalysis Dipstick OB   Collection Time: 12/14/17  9:02 AM  Result Value Ref Range   Color, UA     Clarity, UA     Glucose, UA Negative (A) (none)   Bilirubin, UA     Ketones, UA large    Spec Grav, UA     Blood, UA neg    pH, UA     POC Protein UA Small (1+) (A) Negative, Trace   Urobilinogen, UA     Nitrite, UA neg    Leukocytes, UA Negative Negative   Appearance     Odor      Assessment & Plan:  1) Low-risk pregnancy G2P0010 at 5524w3d with an Estimated Date of Delivery: 04/23/18   2) Hyperemesis, d/c on 11/29/2017 did not help. Lost 6 lbs since last visit.   Meds: No orders of the defined types were placed in this encounter.  Labs/procedures today: none  Plan:   1) Continue routine obstetrical care  2) Refill Rx Zofran and Rx Protonix   Follow-up: Return in about 4 weeks (around 01/11/2018) for LROB.  Orders Placed This Encounter  Procedures  . POC Urinalysis Dipstick OB   By signing my name below, I, Pietro CassisEmily Tufford, attest that this documentation has been prepared under the direction and in the presence of Tilda BurrowFerguson, Mainor Hellmann V, MD. Electronically Signed: Pietro CassisEmily Tufford, Medical Scribe. 12/14/17. 9:17 AM.  I personally performed the services described in this documentation, which was SCRIBED in my presence. The recorded  information has been reviewed and considered accurate. It has been edited as necessary during review. Jonnie Kind, MD

## 2017-12-20 ENCOUNTER — Encounter (HOSPITAL_COMMUNITY): Payer: Self-pay

## 2017-12-20 ENCOUNTER — Inpatient Hospital Stay (HOSPITAL_COMMUNITY)
Admission: AD | Admit: 2017-12-20 | Discharge: 2017-12-23 | DRG: 833 | Disposition: A | Discharge status: Home / Self Care | Payer: Medicaid Other | Attending: Obstetrics and Gynecology | Admitting: Obstetrics and Gynecology

## 2017-12-20 ENCOUNTER — Other Ambulatory Visit: Payer: Self-pay

## 2017-12-20 DIAGNOSIS — O219 Vomiting of pregnancy, unspecified: Secondary | ICD-10-CM | POA: Diagnosis present

## 2017-12-20 DIAGNOSIS — O210 Mild hyperemesis gravidarum: Secondary | ICD-10-CM | POA: Diagnosis not present

## 2017-12-20 DIAGNOSIS — Z3A22 22 weeks gestation of pregnancy: Secondary | ICD-10-CM

## 2017-12-20 HISTORY — DX: Mild hyperemesis gravidarum: O21.0

## 2017-12-20 LAB — COMPREHENSIVE METABOLIC PANEL
ALT: 21 U/L (ref 0–44)
AST: 32 U/L (ref 15–41)
Albumin: 3.6 g/dL (ref 3.5–5.0)
Alkaline Phosphatase: 62 U/L (ref 38–126)
Anion gap: 13 (ref 5–15)
BILIRUBIN TOTAL: 1.6 mg/dL — AB (ref 0.3–1.2)
BUN: 13 mg/dL (ref 6–20)
CO2: 20 mmol/L — ABNORMAL LOW (ref 22–32)
CREATININE: 0.43 mg/dL — AB (ref 0.44–1.00)
Calcium: 9.1 mg/dL (ref 8.9–10.3)
Chloride: 101 mmol/L (ref 98–111)
GFR calc Af Amer: >60 mL/min (ref >60)
Glucose, Bld: 80 mg/dL (ref 70–99)
Potassium: 4.1 mmol/L (ref 3.5–5.1)
Sodium: 134 mmol/L — ABNORMAL LOW (ref 135–145)
TOTAL PROTEIN: 6.9 g/dL (ref 6.5–8.1)

## 2017-12-20 LAB — URINALYSIS, ROUTINE W REFLEX MICROSCOPIC
Bacteria, UA: NONE SEEN
Bilirubin Urine: NEGATIVE
GLUCOSE, UA: NEGATIVE mg/dL
HGB URINE DIPSTICK: NEGATIVE
KETONES UR: 80 mg/dL — AB
LEUKOCYTES UA: NEGATIVE
Nitrite: NEGATIVE
PROTEIN: 100 mg/dL — AB
Specific Gravity, Urine: 1.026 (ref 1.005–1.030)
pH: 6 (ref 5.0–8.0)

## 2017-12-20 LAB — RAPID URINE DRUG SCREEN, HOSP PERFORMED
Amphetamines: NOT DETECTED
Barbiturates: NOT DETECTED
Benzodiazepines: NOT DETECTED
Cocaine: NOT DETECTED
Opiates: NOT DETECTED
Tetrahydrocannabinol: NOT DETECTED

## 2017-12-20 LAB — CBC
HEMATOCRIT: 35.9 % — AB (ref 36.0–46.0)
HEMOGLOBIN: 11.9 g/dL — AB (ref 12.0–15.0)
MCH: 27.3 pg (ref 26.0–34.0)
MCHC: 33.1 g/dL (ref 30.0–36.0)
MCV: 82.3 fL (ref 78.0–100.0)
Platelets: 185 10*3/uL (ref 150–400)
RBC: 4.36 MIL/uL (ref 3.87–5.11)
RDW: 14.3 % (ref 11.5–15.5)
WBC: 4.8 10*3/uL (ref 4.0–10.5)

## 2017-12-20 LAB — MAGNESIUM: MAGNESIUM: 2 mg/dL (ref 1.7–2.4)

## 2017-12-20 MED ORDER — LACTATED RINGERS IV SOLN
Freq: Once | INTRAVENOUS | Status: DC
  Filled 2017-12-20: qty 10

## 2017-12-20 MED ORDER — DIPHENHYDRAMINE HCL 25 MG PO CAPS
25.0000 mg | ORAL_CAPSULE | Freq: Three times a day (TID) | ORAL | Status: DC
  Administered 2017-12-20 – 2017-12-23 (×8): 25 mg via ORAL
  Filled 2017-12-20 (×8): qty 1

## 2017-12-20 MED ORDER — PROMETHAZINE HCL 25 MG/ML IJ SOLN
25.0000 mg | Freq: Once | INTRAMUSCULAR | Status: AC
  Administered 2017-12-20: 25 mg via INTRAVENOUS
  Filled 2017-12-20: qty 1

## 2017-12-20 MED ORDER — M.V.I. ADULT IV INJ
Freq: Once | INTRAVENOUS | Status: DC
  Filled 2017-12-20: qty 10

## 2017-12-20 MED ORDER — CALCIUM CARBONATE ANTACID 500 MG PO CHEW: 2.0000 | CHEWABLE_TABLET | ORAL | Status: DC | PRN

## 2017-12-20 MED ORDER — FAMOTIDINE IN NACL 20-0.9 MG/50ML-% IV SOLN
20.0000 mg | Freq: Two times a day (BID) | INTRAVENOUS | Status: DC
  Administered 2017-12-20 – 2017-12-21 (×3): 20 mg via INTRAVENOUS
  Filled 2017-12-20 (×4): qty 50

## 2017-12-20 MED ORDER — DOCUSATE SODIUM 100 MG PO CAPS: 100.0000 mg | ORAL_CAPSULE | Freq: Two times a day (BID) | ORAL | Status: DC | PRN

## 2017-12-20 MED ORDER — SCOPOLAMINE 1 MG/3DAYS TD PT72
1.0000 | MEDICATED_PATCH | TRANSDERMAL | Status: DC
  Administered 2017-12-20: 1.5 mg via TRANSDERMAL
  Filled 2017-12-20: qty 1

## 2017-12-20 MED ORDER — FAMOTIDINE IN NACL 20-0.9 MG/50ML-% IV SOLN
20.0000 mg | Freq: Once | INTRAVENOUS | Status: AC
  Administered 2017-12-20: 20 mg via INTRAVENOUS
  Filled 2017-12-20: qty 50

## 2017-12-20 MED ORDER — LACTATED RINGERS IV SOLN
INTRAVENOUS | Status: AC
  Administered 2017-12-20 – 2017-12-21 (×4): via INTRAVENOUS

## 2017-12-20 MED ORDER — M.V.I. ADULT IV INJ
Freq: Once | INTRAVENOUS | Status: AC
  Administered 2017-12-20: 14:00:00 via INTRAVENOUS
  Filled 2017-12-20: qty 10

## 2017-12-20 MED ORDER — METOCLOPRAMIDE HCL 5 MG/ML IJ SOLN
10.0000 mg | Freq: Four times a day (QID) | INTRAMUSCULAR | Status: DC
  Administered 2017-12-20 – 2017-12-22 (×8): 10 mg via INTRAVENOUS
  Filled 2017-12-20 (×8): qty 2

## 2017-12-20 MED ORDER — ONDANSETRON HCL 4 MG/2ML IJ SOLN
4.0000 mg | Freq: Three times a day (TID) | INTRAMUSCULAR | Status: DC
  Administered 2017-12-20 – 2017-12-22 (×6): 4 mg via INTRAVENOUS
  Filled 2017-12-20 (×6): qty 2

## 2017-12-20 MED ORDER — PRENATAL MULTIVITAMIN CH: 1.0000 | ORAL_TABLET | Freq: Every day | ORAL | Status: DC

## 2017-12-20 MED ORDER — PYRIDOXINE HCL 25 MG PO TABS
25.0000 mg | ORAL_TABLET | Freq: Two times a day (BID) | ORAL | Status: DC
  Administered 2017-12-20 – 2017-12-23 (×6): 25 mg via ORAL
  Filled 2017-12-20 (×6): qty 1

## 2017-12-20 MED ORDER — LACTATED RINGERS IV BOLUS
1000.0000 mL | Freq: Once | INTRAVENOUS | Status: AC
  Administered 2017-12-20: 1000 mL via INTRAVENOUS

## 2017-12-20 MED ORDER — ENOXAPARIN SODIUM 40 MG/0.4ML ~~LOC~~ SOLN
40.0000 mg | SUBCUTANEOUS | Status: DC
  Administered 2017-12-20 – 2017-12-22 (×3): 40 mg via SUBCUTANEOUS
  Filled 2017-12-20 (×3): qty 0.4

## 2017-12-20 MED ORDER — ZOLPIDEM TARTRATE 5 MG PO TABS: 5.0000 mg | ORAL_TABLET | Freq: Every evening | ORAL | Status: DC | PRN

## 2017-12-20 MED ORDER — ACETAMINOPHEN 325 MG PO TABS
650.0000 mg | ORAL_TABLET | ORAL | Status: DC | PRN
  Administered 2017-12-21: 650 mg via ORAL
  Filled 2017-12-20: qty 2

## 2017-12-20 NOTE — MAU Note (Signed)
Nausea and vomiting since last night.  Would wake up and throw up.  This morning when she threw up, she saw red.  Has pain when she lays back flat.

## 2017-12-20 NOTE — H&P (Signed)
History     CSN: [id number]  Arrival date and time: 12/20/17 4098   First Provider Initiated Contact with Patient 12/20/17 1102      Chief Complaint  Patient presents with  . Emesis   HPI Deborah Evans is a 22 year-old G2P0010 who presents with nausea and vomiting. She has been having vomiting for a few weeks but saw red in her vomit last night and that is why she came in. Her vomit had been a brown-ish color up until last night when she saw red.  She reports that she has been throwing up about 3 times per hour. She is unable to keep down any food. Was able to keep down some water up until last night. She denies diarrhea, constipation, or fevers. She was admitted to the hospital 3 weeks ago for hyperemesis and sent home with Zofran, Reglan, and Compazine. She reports that these medications helped for a while but have not been helping recently. She reports abdominal pain when she lays flat. She also reports a headache that is somewhat responsive to Tylenol.  The patient has had 9 ED and MAU visits and 2 antenatal admissions so far in this pregnancy.   OB History    Gravida  2   Para      Term      Preterm      AB  1   Living  0     SAB  1   TAB      Ectopic      Multiple      Live Births              Past Medical History:  Diagnosis Date  . Hyperemesis gravidarum     Past Surgical History:  Procedure Laterality Date  . DILATION AND CURETTAGE OF UTERUS N/A 07/15/2014   Procedure: SUCTION DILATATION AND CURETTAGE;  Surgeon: Lazaro Arms, MD;  Location: [facility];  Service: Gynecology;  Laterality: N/A;    Family History  Problem Relation Age of Onset  . Hypertension Maternal Grandmother   . ADD / ADHD Neg Hx   . Alcohol abuse Neg Hx   . Anxiety disorder Neg Hx   . Arthritis Neg Hx   . Birth defects Neg Hx   . Asthma Neg Hx   . Cancer Neg Hx   . COPD Neg Hx   . Depression Neg Hx   . Diabetes Neg Hx   . Drug abuse Neg Hx   . Early death Neg Hx   .  Hearing loss Neg Hx   . Heart disease Neg Hx   . Hyperlipidemia Neg Hx   . Intellectual disability Neg Hx   . Kidney disease Neg Hx   . Learning disabilities Neg Hx   . Miscarriages / Stillbirths Neg Hx   . Obesity Neg Hx   . Stroke Neg Hx   . Vision loss Neg Hx   . Varicose Veins Neg Hx     Social History   Tobacco Use  . Smoking status: Never Smoker  . Smokeless tobacco: Never Used  Substance Use Topics  . Alcohol use: No  . Drug use: No    Allergies: No Known Allergies  Medications Prior to Admission  Medication Sig Dispense Refill Last Dose  . ondansetron (ZOFRAN) 8 MG tablet Take 1 tablet (8 mg total) by mouth every 8 (eight) hours as needed for nausea or vomiting. 20 tablet 3   . pantoprazole (PROTONIX) 20 MG tablet  2 tabs daily for heartburn. 30 tablet 1   . potassium chloride SA (K-DUR,KLOR-CON) 20 MEQ tablet Take 2 tablets (40 mEq total) by mouth daily. 30 tablet 1 Taking  . Prenatal MV-Min-FA-Omega-3 (PRENATAL GUMMIES/DHA & FA PO) Take 2 tablets by mouth daily.    Taking  . promethazine (PHENERGAN) 25 MG tablet Take 1 tablet (25 mg total) by mouth every 6 (six) hours as needed. (Patient not taking: Reported on 12/14/2017) 15 tablet 0 Not Taking    Review of Systems  Constitutional: Negative.  Negative for fatigue and fever.  HENT: Negative.   Respiratory: Negative.  Negative for shortness of breath.   Cardiovascular: Negative.  Negative for chest pain.  Gastrointestinal: Positive for nausea and vomiting. Negative for abdominal pain, constipation and diarrhea.  Genitourinary: Negative.  Negative for dysuria, vaginal bleeding and vaginal discharge.  Neurological: Negative.  Negative for dizziness and headaches.   Physical Exam   Blood pressure 117/75, pulse (!) 105, temperature 98.5 F (36.9 C), temperature source Oral, resp. rate 18, weight 63.2 kg, last menstrual period 08/23/2017, SpO2 99 %, unknown if currently breastfeeding.  Physical Exam  Nursing note  and vitals reviewed. Constitutional: She is oriented to person, place, and time. She appears well-developed and well-nourished. No distress.  HENT:  Head: Normocephalic.  Eyes: Pupils are equal, round, and reactive to light.  Cardiovascular: Normal rate, regular rhythm and normal heart sounds.  Respiratory: Effort normal and breath sounds normal. No respiratory distress.  GI: Soft. Bowel sounds are normal. She exhibits no distension. There is no tenderness.  Neurological: She is alert and oriented to person, place, and time.  Skin: Skin is warm and dry.  Psychiatric: She has a normal mood and affect. Her behavior is normal. Judgment and thought content normal.   FHT: 152 bpm  MAU Course  Procedures Results for orders placed or performed during the hospital encounter of 12/20/17 (from the past 24 hour(s))  Urinalysis, Routine w reflex microscopic     Status: Abnormal   Collection Time: 12/20/17 10:25 AM  Result Value Ref Range   Color, Urine AMBER (A) YELLOW   APPearance CLOUDY (A) CLEAR   Specific Gravity, Urine 1.026 1.005 - 1.030   pH 6.0 5.0 - 8.0   Glucose, UA NEGATIVE NEGATIVE mg/dL   Hgb urine dipstick NEGATIVE NEGATIVE   Bilirubin Urine NEGATIVE NEGATIVE   Ketones, ur 80 (A) NEGATIVE mg/dL   Protein, ur 960100 (A) NEGATIVE mg/dL   Nitrite NEGATIVE NEGATIVE   Leukocytes, UA NEGATIVE NEGATIVE   WBC, UA 6-10 0 - 5 WBC/hpf   Bacteria, UA NONE SEEN NONE SEEN   Squamous Epithelial / LPF 0-5 0 - 5   Mucus PRESENT   Urine rapid drug screen (hosp performed)     Status: None   Collection Time: 12/20/17 10:25 AM  Result Value Ref Range   Opiates NONE DETECTED NONE DETECTED   Cocaine NONE DETECTED NONE DETECTED   Benzodiazepines NONE DETECTED NONE DETECTED   Amphetamines NONE DETECTED NONE DETECTED   Tetrahydrocannabinol NONE DETECTED NONE DETECTED   Barbiturates NONE DETECTED NONE DETECTED   MDM UA, UDS CBC, CMP LR Bolus Multivitamin bolus Phenergan IV Pepcid  IV  Consulted with Dr. Vergie LivingPickens regarding failed outpatient management and multiple admissions- [facility]  Assessment and Plan   1. Hyperemesis affecting pregnancy, antepartum    -Admit to OB High Risk -MD to put in orders  Rolm BookbinderCaroline M Bartholomew Ramesh CNM 12/20/2017, 11:05 AM

## 2017-12-20 NOTE — MAU Provider Note (Signed)
History     CSN: [id number]  Arrival date and time: 12/20/17 [id number]   First Provider Initiated Contact with Patient 12/20/17 1102      Chief Complaint  Patient presents with  . Emesis   Deborah Evans is a 22 year-old G2P0010 who presents with nausea and vomiting. She has been having vomiting for a few weeks but saw red in her vomit last night and that is why she came in. Her vomit had been a brown-ish color up until last night when she saw red.  She reports that she has been throwing up about 3 times per hour. She is unable to keep down any food. Was able to keep down some water up until last night. She denies diarrhea, constipation, or fevers. She was admitted to the hospital 3 weeks ago for hyperemesis and sent home with Zofran, Reglan, and Compazine. She reports that these medications helped for a while but have not been helping recently. She reports abdominal pain when she lays flat. She also reports a headache that is somewhat responsive to Tylenol.  OB History    Gravida  2   Para      Term      Preterm      AB  1   Living  0     SAB  1   TAB      Ectopic      Multiple      Live Births              Past Medical History:  Diagnosis Date  . Hyperemesis gravidarum     Past Surgical History:  Procedure Laterality Date  . DILATION AND CURETTAGE OF UTERUS N/A 07/15/2014   Procedure: SUCTION DILATATION AND CURETTAGE;  Surgeon: Lazaro ArmsLuther H Eure, MD;  Location: [facility];  Service: Gynecology;  Laterality: N/A;    Family History  Problem Relation Age of Onset  . Hypertension Maternal Grandmother   . ADD / ADHD Neg Hx   . Alcohol abuse Neg Hx   . Anxiety disorder Neg Hx   . Arthritis Neg Hx   . Birth defects Neg Hx   . Asthma Neg Hx   . Cancer Neg Hx   . COPD Neg Hx   . Depression Neg Hx   . Diabetes Neg Hx   . Drug abuse Neg Hx   . Early death Neg Hx   . Hearing loss Neg Hx   . Heart disease Neg Hx   . Hyperlipidemia Neg Hx   . Intellectual disability Neg  Hx   . Kidney disease Neg Hx   . Learning disabilities Neg Hx   . Miscarriages / Stillbirths Neg Hx   . Obesity Neg Hx   . Stroke Neg Hx   . Vision loss Neg Hx   . Varicose Veins Neg Hx     Social History   Tobacco Use  . Smoking status: Never Smoker  . Smokeless tobacco: Never Used  Substance Use Topics  . Alcohol use: No  . Drug use: No    Allergies: No Known Allergies  Medications Prior to Admission  Medication Sig Dispense Refill Last Dose  . ondansetron (ZOFRAN) 8 MG tablet Take 1 tablet (8 mg total) by mouth every 8 (eight) hours as needed for nausea or vomiting. 20 tablet 3   . pantoprazole (PROTONIX) 20 MG tablet 2 tabs daily for heartburn. 30 tablet 1   . potassium chloride SA (K-DUR,KLOR-CON) 20 MEQ tablet Take 2 tablets (40  mEq total) by mouth daily. 30 tablet 1 Taking  . Prenatal MV-Min-FA-Omega-3 (PRENATAL GUMMIES/DHA & FA PO) Take 2 tablets by mouth daily.    Taking  . promethazine (PHENERGAN) 25 MG tablet Take 1 tablet (25 mg total) by mouth every 6 (six) hours as needed. (Patient not taking: Reported on 12/14/2017) 15 tablet 0 Not Taking    Review of Systems  Constitutional: Negative for fever.  Gastrointestinal: Positive for abdominal pain, nausea and vomiting. Negative for constipation and diarrhea.  Genitourinary: Negative for difficulty urinating and dysuria.  Neurological: Positive for headaches.   Physical Exam   Blood pressure 117/75, pulse (!) 105, temperature 98.5 F (36.9 C), temperature source Oral, resp. rate 18, weight 63.2 kg, last menstrual period 08/23/2017, SpO2 99 %, unknown if currently breastfeeding.  Gen: Ill-appearing, NAD HEENT: Dry mucous membranes CV: Mildly tachycardic, regular rhythm, no murmurs Pulm: No increased work of breathing, no wheezes Abd: Gravid, nontender  MAU Course   MDM LR bolus given, treated with IV Reglan and Zofran. UA suggestive of dehydration. CMP pending, CBC pending. Dr. Vergie Living consulted, patient to  be admitted.  Assessment and Plan  Deborah Evans is a 22 year-old G2P0010 who presents with nausea and vomiting. Electrolytes are pending. She is failing outpatient treatment of Hyperemesis Gravidarum. She requires admission for inpatient management of her vomiting.  Hyperemesis Gravidarum - LR Bolus given in MAU - IV Reglan, Zofran given in MAU - UA (100 protein, 80 ketones) - CMP (pending) - CBC (pending) - Admit   Natasha Mead, MS3 12/20/2017, 11:06 AM

## 2017-12-20 NOTE — Plan of Care (Signed)

## 2017-12-21 LAB — CULTURE, OB URINE

## 2017-12-21 MED ORDER — ENSURE ENLIVE PO LIQD
237.0000 mL | Freq: Two times a day (BID) | ORAL | Status: DC
  Administered 2017-12-21 – 2017-12-22 (×3): 237 mL via ORAL
  Filled 2017-12-21 (×5): qty 237

## 2017-12-21 NOTE — Progress Notes (Signed)
Initial Nutrition Assessment  DOCUMENTATION CODES:   Not applicable  INTERVENTION:  Regular diet is ordered Snacks per pt Ensure BID   NUTRITION DIAGNOSIS:   Inadequate oral intake related to nausea, vomiting as evidenced by percent weight loss.  GOAL:  Patient will meet greater than or equal to 90% of their needs, Weight gain   MONITOR:  PO intake  REASON FOR ASSESSMENT:  Other (Comment)(Hyperemesis)    ASSESSMENT:  22 3/7 weeks, ongoing hyperemesis X 2 + months . Pre-preg weight 169 lbs. 27 lb weight loss overall.  84% of usual weight Pt reports she tol and kept down a biscuit today. Requests Ensure, vanilla.  Feels better today   Diet Order:   Diet Order            Diet regular Room service appropriate? Yes; Fluid consistency: Thin  Diet effective now             EDUCATION NEEDS:   No education needs have been identified at this time  Skin:  Skin Assessment: Reviewed RN Assessment  Height:   Ht Readings from Last 1 Encounters:  12/20/17 5\' 8"  (1.727 m)    Weight:   Wt Readings from Last 1 Encounters:  12/21/17 64.6 kg    Ideal Body Weight:     BMI:  Body mass index is 21.64 kg/m.  Estimated Nutritional Needs:   Kcal:  1900-2100  Protein:  85-95 g  Fluid:  2.2 L    Elisabeth CaraKatherine Edson Deridder M.Odis LusterEd. R.D. LDN Neonatal Nutrition Support Specialist/RD III Pager [phone]      Phone [phone]

## 2017-12-21 NOTE — Progress Notes (Addendum)
Daily Antepartum Note  Admission Date: 12/20/2017 Current Date: 12/21/2017 9:45 AM  Deborah Evans is a 22 y.o. G2P0010 @ 8167w3d, HD#2, readmitted for persistent n/v of pregnancy/hyperemesis.  Pregnancy complicated by: Patient Active Problem List   Diagnosis Date Noted  . Nausea and vomiting in pregnancy 12/20/2017  . Hyperemesis affecting pregnancy, antepartum 12/03/2017  . Hypomagnesemia 11/28/2017  . Hypokalemia due to loss of potassium 11/28/2017  . Hyperemesis 11/24/2017  . Supervision of normal pregnancy 11/08/2017    Overnight/24hr events:  none  Subjective:  Pt states she feels better. No nausea or vomiting. Hasn't tried much PO. Not voiding a lot  Objective:    Current Vital Signs 24h Vital Sign Ranges  T 98 F (36.7 C) Temp  Avg: 98.6 F (37 C)  Min: 98 F (36.7 C)  Max: 99.3 F (37.4 C)  BP 110/71 BP  Min: 97/58  Max: 117/75  HR 95 Pulse  Avg: 90.7  Min: 84  Max: 105  RR 16 Resp  Avg: 17.3  Min: 16  Max: 18  SaO2 100 % Room Air SpO2  Avg: 99.8 %  Min: 99 %  Max: 100 %       24 Hour I/O Current Shift I/O  Time Ins Outs 08/22 0701 - 08/23 0700 In: 760 [P.O.:360; I.V.:400] Out: 450 [Urine:300] 08/23 0701 - 08/23 1900 In: -  Out: 300 [Urine:300]    Physical exam: General: Well nourished, well developed female in no acute distress. Pt looks better than on admission Abdomen: gravid nttp Cardiovascular: S1, S2 normal, no murmur, rub or gallop, regular rate and rhythm Respiratory: CTAB Extremities: no clubbing, cyanosis or edema Skin: Warm and dry.   Medications: Current Facility-Administered Medications  Medication Dose Route Frequency Provider Last Rate Last Dose  . acetaminophen (TYLENOL) tablet 650 mg  650 mg Oral Q4H PRN [place] BingPickens, Micah Barnier, MD      . diphenhydrAMINE (BENADRYL) capsule 25 mg  25 mg Oral Q8H [place] BingPickens, Abeer Iversen, MD   25 mg at 12/21/17 0554  . docusate sodium (COLACE) capsule 100 mg  100 mg Oral BID PRN [place] BingPickens, Brandin Dilday, MD      . enoxaparin  (LOVENOX) injection 40 mg  40 mg Subcutaneous Q24H [place] BingPickens, Feiga Nadel, MD   40 mg at 12/20/17 1427  . famotidine (PEPCID) IVPB 20 mg premix  20 mg Intravenous Q12H [place] BingPickens, Josey Dettmann, MD 100 mL/hr at 12/20/17 2151 20 mg at 12/20/17 2151  . lactated ringers infusion   Intravenous Continuous [place] BingPickens, Colin Norment, MD 100 mL/hr at 12/21/17 0300    . metoCLOPramide (REGLAN) injection 10 mg  10 mg Intravenous Q6H [place] BingPickens, Khalel Alms, MD   10 mg at 12/21/17 0554  . ondansetron (ZOFRAN) injection 4 mg  4 mg Intravenous Q8H [place] BingPickens, Cyril Woodmansee, MD   4 mg at 12/21/17 0554  . pyridOXINE (VITAMIN B-6) tablet 25 mg  25 mg Oral BID [place] BingPickens, Yechiel Erny, MD   25 mg at 12/20/17 2151  . scopolamine (TRANSDERM-SCOP) 1 MG/3DAYS 1.5 mg  1 patch Transdermal Q72H [place] BingPickens, Maryclaire Stoecker, MD   1.5 mg at 12/20/17 1839  . zolpidem (AMBIEN) tablet 5 mg  5 mg Oral QHS PRN  BingPickens, Lucca Greggs, MD        Labs:  Recent Labs  Lab 12/20/17 1201  WBC 4.8  HGB 11.9*  HCT 35.9*  PLT 185    Recent Labs  Lab 12/20/17 1201  NA 134*  K 4.1  CL 101  CO2 20*  BUN 13  CREATININE 0.43*  CALCIUM 9.1  PROT 6.9  BILITOT 1.6*  ALKPHOS 62  ALT 21  AST 32  GLUCOSE 80   Mg: 2 UDS: negative  Pending: UCx.   Radiology: no new imaging  Assessment & Plan:  Pt improving *Pregnancy: routine care. Qday FHTs. *N/V of pregnancy: continue with IV antiemetics. Will see if she can eat more and try to put on PO meds tomorrow. Leave on MIVF until voidiing more -weight on admission 139lbs vs 149lbs on 8/11 *Preterm: no issues *PPx: lovenox,oob ad lib *FEN/GI: see above. Currently on clears. May advance to fulls later today *Dispo: when able to take more PO.   Cornelia Copa MD Attending Center for [facility])

## 2017-12-22 DIAGNOSIS — O210 Mild hyperemesis gravidarum: Principal | ICD-10-CM

## 2017-12-22 MED ORDER — METOCLOPRAMIDE HCL 10 MG PO TABS
10.0000 mg | ORAL_TABLET | Freq: Three times a day (TID) | ORAL | Status: DC
  Administered 2017-12-22 – 2017-12-23 (×3): 10 mg via ORAL
  Filled 2017-12-22 (×3): qty 1

## 2017-12-22 MED ORDER — ONDANSETRON 8 MG PO TBDP
8.0000 mg | ORAL_TABLET | Freq: Three times a day (TID) | ORAL | Status: DC | PRN
  Filled 2017-12-22: qty 1

## 2017-12-22 MED ORDER — FAMOTIDINE 20 MG PO TABS
20.0000 mg | ORAL_TABLET | Freq: Every day | ORAL | Status: DC
  Administered 2017-12-22 – 2017-12-23 (×2): 20 mg via ORAL
  Filled 2017-12-22 (×2): qty 1

## 2017-12-22 NOTE — Progress Notes (Signed)
Patient ID: Deborah Evans, female   DOB: 07/22/1995, 22 y.o.   MRN: [id number] FACULTY PRACTICE ANTEPARTUM(COMPREHENSIVE) NOTE  Deborah Quamyana S Parkison is a 22 y.o. G2P0010 with Estimated Date of Delivery: 04/23/18    3318w4d  who is admitted for hyperemesis gravidarum.    Fetal presentation is unsure. Length of Stay:  2  Days  Date of admission:12/20/2017  Subjective: No emesis yesterday tolerated liquids and a little food Patient reports the fetal movement as active. Patient reports uterine contraction  activity as none. Patient reports  vaginal bleeding as none. Patient describes fluid per vagina as None.  Vitals:  Blood pressure 102/74, pulse 84, temperature 98.8 F (37.1 C), temperature source Oral, resp. rate 18, height 5\' 8"  (1.727 m), weight 66.7 kg, last menstrual period 08/23/2017, SpO2 100 %, unknown if currently breastfeeding. Vitals:   12/21/17 2020 12/22/17 0409 12/22/17 0510 12/22/17 0743  BP: 108/68 106/78  102/74  Pulse: (!) 101 88  84  Resp: 16 16  18   Temp: 99.1 F (37.3 C) 98.6 F (37 C)  98.8 F (37.1 C)  TempSrc: Oral   Oral  SpO2: 100% 100%    Weight:   66.7 kg   Height:       Physical Examination:  General appearance - alert, well appearing, and in no distress Fundal Height:  size equals dates Pelvic Exam:  normal external genitalia, vulva, vagina, cervix, uterus and adnexa, examination not indicated C Membranes:intact  Fetal Monitoring:  Doppler 140s     Labs:  No results found for this or any previous visit (from the past 24 hour(s)).  Imaging Studies:      Medications:  Scheduled . diphenhydrAMINE  25 mg Oral Q8H  . enoxaparin (LOVENOX) injection  40 mg Subcutaneous Q24H  . famotidine  20 mg Oral Daily  . feeding supplement (ENSURE ENLIVE)  237 mL Oral BID BM  . metoCLOPramide  10 mg Oral TID AC  . vitamin B-6  25 mg Oral BID  . scopolamine  1 patch Transdermal Q72H   I have reviewed the patient's current medications.  ASSESSMENT: G2P0010  2618w4d Estimated Date of Delivery: 04/23/18  Patient Active Problem List   Diagnosis Date Noted  . Nausea and vomiting in pregnancy 12/20/2017  . Hyperemesis affecting pregnancy, antepartum 12/03/2017  . Hyperemesis 11/24/2017  . Supervision of normal pregnancy 11/08/2017    PLAN: >tolerated fluids and some food, biscuit + Pizza >switch to oral meds from IV >if tolerates anticipate discharge tomorrow  Amaryllis DykeLuther H Posey Jasmin 12/22/2017,8:58 AM

## 2017-12-23 MED ORDER — FAMOTIDINE 20 MG PO TABS: 20.0000 mg | ORAL_TABLET | Freq: Every day | ORAL | 3 refills | Status: DC

## 2017-12-23 MED ORDER — METOCLOPRAMIDE HCL 10 MG PO TABS: 10.0000 mg | ORAL_TABLET | Freq: Three times a day (TID) | ORAL | 1 refills | Status: DC

## 2017-12-23 MED ORDER — DIPHENHYDRAMINE HCL 25 MG PO CAPS: 25.0000 mg | ORAL_CAPSULE | Freq: Three times a day (TID) | ORAL | 1 refills | Status: DC | PRN

## 2017-12-23 MED ORDER — SCOPOLAMINE 1 MG/3DAYS TD PT72: 1.0000 | MEDICATED_PATCH | TRANSDERMAL | 1 refills | Status: DC

## 2017-12-23 MED ORDER — ONDANSETRON 8 MG PO TBDP: 8.0000 mg | ORAL_TABLET | Freq: Three times a day (TID) | ORAL | 1 refills | Status: DC | PRN

## 2017-12-23 MED ORDER — PYRIDOXINE HCL 25 MG PO TABS: 25.0000 mg | ORAL_TABLET | Freq: Two times a day (BID) | ORAL | 3 refills | Status: DC

## 2017-12-23 NOTE — Discharge Summary (Signed)
Discharge Summary   Admit Date: 12/20/2017 Discharge Date: 12/23/2017 Discharging Service: Antepartum  Primary [facility]: [facility] Admitting Physician:  Bing, MD  Discharge Physician: Vergie Living  Referring Provider: Self  Primary Care Provider: Patient, No Pcp Per  Admission Diagnoses: 22wks with acute on chronic hyperemesis  Discharge Diagnoses: Improved hyperemesis of pregnancy  Consult Orders: None   Surgeries/Procedures Performed: none  History and Physical: Attestation of Attending Supervision of Certified Nurse Midwife: Evaluation and management procedures were performed by the CNM under my supervision.  I have seen and examined the patient,  reviewed the CNM's note and chart, and I agree with the management and plan.   Cornelia Copa MD Attending Center for [facility] (Faculty Practice)          History     CSN: [id number]  Arrival date and time: 12/20/17 4098   First Provider Initiated Contact with Patient 12/20/17 1102         Chief Complaint  Patient presents with  . Emesis   HPI Deborah Evans is a 22 year-old G2P0010 who presents with nausea and vomiting. She has been having vomiting for a few weeks but saw red in her vomit last night and that is why she came in. Her vomit had been a brown-ish color up until last night when she saw red. She reports that she has been throwing up about 3 times per hour. She is unable to keep down any food. Was able to keep down some water up until last night. She denies diarrhea, constipation, or fevers. She was admitted to the hospital 3 weeks ago for hyperemesis and sent home with Zofran, Reglan, and Compazine. She reports that these medications helped for a while but have not been helping recently. She reports abdominal pain when she lays flat. She also reports a headache that is somewhat responsive to Tylenol.  The patient has had 9 ED and MAU visits and 2 antenatal admissions so far in  this pregnancy.           OB History    Gravida  2   Para      Term      Preterm      AB  1   Living  0     SAB  1   TAB      Ectopic      Multiple      Live Births                  Past Medical History:  Diagnosis Date  . Hyperemesis gravidarum          Past Surgical History:  Procedure Laterality Date  . DILATION AND CURETTAGE OF UTERUS N/A 07/15/2014   Procedure: SUCTION DILATATION AND CURETTAGE;  Surgeon: Lazaro Arms, MD;  Location: [facility];  Service: Gynecology;  Laterality: N/A;         Family History  Problem Relation Age of Onset  . Hypertension Maternal Grandmother   . ADD / ADHD Neg Hx   . Alcohol abuse Neg Hx   . Anxiety disorder Neg Hx   . Arthritis Neg Hx   . Birth defects Neg Hx   . Asthma Neg Hx   . Cancer Neg Hx   . COPD Neg Hx   . Depression Neg Hx   . Diabetes Neg Hx   . Drug abuse Neg Hx   . Early death Neg Hx   . Hearing loss Neg Hx   .  Heart disease Neg Hx   . Hyperlipidemia Neg Hx   . Intellectual disability Neg Hx   . Kidney disease Neg Hx   . Learning disabilities Neg Hx   . Miscarriages / Stillbirths Neg Hx   . Obesity Neg Hx   . Stroke Neg Hx   . Vision loss Neg Hx   . Varicose Veins Neg Hx     Social History       Tobacco Use  . Smoking status: Never Smoker  . Smokeless tobacco: Never Used  Substance Use Topics  . Alcohol use: No  . Drug use: No    Allergies: No Known Allergies         Medications Prior to Admission  Medication Sig Dispense Refill Last Dose  . ondansetron (ZOFRAN) 8 MG tablet Take 1 tablet (8 mg total) by mouth every 8 (eight) hours as needed for nausea or vomiting. 20 tablet 3   . pantoprazole (PROTONIX) 20 MG tablet 2 tabs daily for heartburn. 30 tablet 1   . potassium chloride SA (K-DUR,KLOR-CON) 20 MEQ tablet Take 2 tablets (40 mEq total) by mouth daily. 30 tablet 1 Taking  . Prenatal MV-Min-FA-Omega-3 (PRENATAL GUMMIES/DHA & FA PO)  Take 2 tablets by mouth daily.    Taking  . promethazine (PHENERGAN) 25 MG tablet Take 1 tablet (25 mg total) by mouth every 6 (six) hours as needed. (Patient not taking: Reported on 12/14/2017) 15 tablet 0 Not Taking    Review of Systems  Constitutional: Negative.  Negative for fatigue and fever.  HENT: Negative.   Respiratory: Negative.  Negative for shortness of breath.   Cardiovascular: Negative.  Negative for chest pain.  Gastrointestinal: Positive for nausea and vomiting. Negative for abdominal pain, constipation and diarrhea.  Genitourinary: Negative.  Negative for dysuria, vaginal bleeding and vaginal discharge.  Neurological: Negative.  Negative for dizziness and headaches.   Physical Exam   Blood pressure 117/75, pulse (!) 105, temperature 98.5 F (36.9 C), temperature source Oral, resp. rate 18, weight 63.2 kg, last menstrual period 08/23/2017, SpO2 99 %, unknown if currently breastfeeding.  Physical Exam  Nursing note and vitals reviewed. Constitutional: She is oriented to person, place, and time. She appears well-developed and well-nourished. No distress.  HENT:  Head: Normocephalic.  Eyes: Pupils are equal, round, and reactive to light.  Cardiovascular: Normal rate, regular rhythm and normal heart sounds.  Respiratory: Effort normal and breath sounds normal. No respiratory distress.  GI: Soft. Bowel sounds are normal. She exhibits no distension. There is no tenderness.  Neurological: She is alert and oriented to person, place, and time.  Skin: Skin is warm and dry.  Psychiatric: She has a normal mood and affect. Her behavior is normal. Judgment and thought content normal.   FHT: 152 bpm  MAU Course  Procedures      Results for orders placed or performed during the hospital encounter of 12/20/17 (from the past 24 hour(s))  Urinalysis, Routine w reflex microscopic     Status: Abnormal   Collection Time: 12/20/17 10:25 AM  Result Value Ref Range   Color,  Urine AMBER (A) YELLOW   APPearance CLOUDY (A) CLEAR   Specific Gravity, Urine 1.026 1.005 - 1.030   pH 6.0 5.0 - 8.0   Glucose, UA NEGATIVE NEGATIVE mg/dL   Hgb urine dipstick NEGATIVE NEGATIVE   Bilirubin Urine NEGATIVE NEGATIVE   Ketones, ur 80 (A) NEGATIVE mg/dL   Protein, ur 914 (A) NEGATIVE mg/dL   Nitrite NEGATIVE NEGATIVE  Leukocytes, UA NEGATIVE NEGATIVE   WBC, UA 6-10 0 - 5 WBC/hpf   Bacteria, UA NONE SEEN NONE SEEN   Squamous Epithelial / LPF 0-5 0 - 5   Mucus PRESENT   Urine rapid drug screen (hosp performed)     Status: None   Collection Time: 12/20/17 10:25 AM  Result Value Ref Range   Opiates NONE DETECTED NONE DETECTED   Cocaine NONE DETECTED NONE DETECTED   Benzodiazepines NONE DETECTED NONE DETECTED   Amphetamines NONE DETECTED NONE DETECTED   Tetrahydrocannabinol NONE DETECTED NONE DETECTED   Barbiturates NONE DETECTED NONE DETECTED   MDM UA, UDS CBC, CMP LR Bolus Multivitamin bolus Phenergan IV Pepcid IV  Consulted with Dr. Vergie Living regarding failed outpatient management and multiple admissions- will admit to [facility] High Risk  Assessment and Plan   1. Hyperemesis affecting pregnancy, antepartum    -Admit to OB High Risk -MD to put in orders  Rolm Bookbinder CNM 12/20/2017, 11:05 AM         Cosigned by: [place] Bing, MD at 12/21/2017 9:45 AM  Electronically signed by Rolm Bookbinder, CNM at 12/20/2017 12:10 PM Electronically signed by [place] Bing, MD at 12/21/2017 9:45 AM     Admission (Discharged) on 12/20/2017       Revision & Routing History       Detailed Report      Hospital Course: Patient steadily improved with scheduled IV and PO anti-emetics during her hospitalization and was able to tolerate a regular diet and was amenable to discharge to home.  Discharge Exam:    Current Vital Signs 24h Vital Sign Ranges  T 97.6 F (36.4 C) Temp  Avg: 98.5 F (36.9 C)  Min: 97.6 F (36.4 C)  Max: 99.3  F (37.4 C)  BP 113/71 BP  Min: 98/69  Max: 113/71  HR 91 Pulse  Avg: 88.2  Min: 84  Max: 95  RR 16 Resp  Avg: 16.6  Min: 15  Max: 18  SaO2 98 % Room Air SpO2  Avg: 98.8 %  Min: 98 %  Max: 100 %       24 Hour I/O Current Shift I/O  Time Ins Outs 08/24 0701 - 08/25 0700 In: 240 [P.O.:240] Out: 500 [Urine:500] No intake/output data recorded.   General appearance: Well nourished, well developed female in no acute distress.  Cardiovascular: S1, S2 normal, no murmur, rub or gallop, regular rate and rhythm Respiratory:  Clear to auscultation bilateral. Normal respiratory effort Abdomen: gravid, nttp Neuro/Psych:  Normal mood and affect.  Skin:  Warm and dry.  Lymphatic:  No inguinal lymphadenopathy.   Discharge Disposition:  Home  Patient Instructions:  Standard   Results Pending at Discharge:  none  Discharge Medications: Allergies as of 12/23/2017   No Known Allergies     Medication List    STOP taking these medications   ondansetron 8 MG tablet Commonly known as:  ZOFRAN   pantoprazole 20 MG tablet Commonly known as:  PROTONIX   potassium chloride SA 20 MEQ tablet Commonly known as:  K-DUR,KLOR-CON     TAKE these medications   diphenhydrAMINE 25 mg capsule Commonly known as:  BENADRYL Take 1 capsule (25 mg total) by mouth every 8 (eight) hours as needed (nausea, vomiting).   famotidine 20 MG tablet Commonly known as:  PEPCID Take 1 tablet (20 mg total) by mouth daily.   metoCLOPramide 10 MG tablet Commonly known as:  REGLAN Take 1 tablet (10 mg total) by  mouth 3 (three) times daily before meals.   ondansetron 8 MG disintegrating tablet Commonly known as:  ZOFRAN-ODT Take 1 tablet (8 mg total) by mouth every 8 (eight) hours as needed for nausea or vomiting.   PRENATAL GUMMIES/DHA & FA PO Take 2 tablets by mouth daily.   promethazine 25 MG tablet Commonly known as:  PHENERGAN Take 1 tablet (25 mg total) by mouth every 6 (six) hours as needed.     pyridOXINE 25 MG tablet Commonly known as:  VITAMIN B-6 Take 1 tablet (25 mg total) by mouth 2 (two) times daily.   scopolamine 1 MG/3DAYS Commonly known as:  TRANSDERM-SCOP Place 1 patch (1.5 mg total) onto the skin every 3 (three) days.        Future Appointments  Date Time Provider Department Center  01/11/2018  8:45 AM Cheral MarkerBooker, Kimberly R, CNM [facility]    Cornelia Copaharlie Melyna Huron, Jr. MD Attending Center for [facility](Faculty Practice)

## 2017-12-23 NOTE — Progress Notes (Signed)
Discharge instructions and prescriptions given to pt. Discussed signs and symptoms to report, importance of hydration and nutrition, upcoming appointments, and meds. Pt verbalizes understanding and has no questions or concerns at this time. FHR of 142 noted on doppler. Pt reports positive fetal movement and was discharged from hospital in stable condition.

## 2017-12-31 ENCOUNTER — Inpatient Hospital Stay (HOSPITAL_COMMUNITY)
Admission: EM | Admit: 2017-12-31 | Discharge: 2018-01-08 | DRG: 833 | Disposition: A | Discharge status: Home / Self Care | Payer: Medicaid Other | Attending: Obstetrics and Gynecology | Admitting: Obstetrics and Gynecology

## 2017-12-31 ENCOUNTER — Encounter (HOSPITAL_COMMUNITY): Payer: Self-pay | Admitting: *Deleted

## 2017-12-31 ENCOUNTER — Other Ambulatory Visit: Payer: Self-pay

## 2017-12-31 DIAGNOSIS — E876 Hypokalemia: Secondary | ICD-10-CM | POA: Diagnosis not present

## 2017-12-31 DIAGNOSIS — R079 Chest pain, unspecified: Secondary | ICD-10-CM | POA: Diagnosis not present

## 2017-12-31 DIAGNOSIS — O210 Mild hyperemesis gravidarum: Secondary | ICD-10-CM

## 2017-12-31 DIAGNOSIS — Z3490 Encounter for supervision of normal pregnancy, unspecified, unspecified trimester: Secondary | ICD-10-CM

## 2017-12-31 DIAGNOSIS — R0789 Other chest pain: Secondary | ICD-10-CM | POA: Diagnosis not present

## 2017-12-31 DIAGNOSIS — R634 Abnormal weight loss: Secondary | ICD-10-CM | POA: Diagnosis present

## 2017-12-31 DIAGNOSIS — Z0189 Encounter for other specified special examinations: Secondary | ICD-10-CM

## 2017-12-31 DIAGNOSIS — O99612 Diseases of the digestive system complicating pregnancy, second trimester: Secondary | ICD-10-CM | POA: Diagnosis present

## 2017-12-31 DIAGNOSIS — K219 Gastro-esophageal reflux disease without esophagitis: Secondary | ICD-10-CM | POA: Clinically undetermined

## 2017-12-31 DIAGNOSIS — Z3A24 24 weeks gestation of pregnancy: Secondary | ICD-10-CM | POA: Diagnosis not present

## 2017-12-31 DIAGNOSIS — Z4682 Encounter for fitting and adjustment of non-vascular catheter: Secondary | ICD-10-CM | POA: Diagnosis not present

## 2017-12-31 DIAGNOSIS — R110 Nausea: Secondary | ICD-10-CM | POA: Diagnosis not present

## 2017-12-31 DIAGNOSIS — O212 Late vomiting of pregnancy: Secondary | ICD-10-CM | POA: Diagnosis not present

## 2017-12-31 DIAGNOSIS — R1111 Vomiting without nausea: Secondary | ICD-10-CM | POA: Diagnosis not present

## 2017-12-31 DIAGNOSIS — O211 Hyperemesis gravidarum with metabolic disturbance: Principal | ICD-10-CM | POA: Diagnosis present

## 2017-12-31 DIAGNOSIS — O99282 Endocrine, nutritional and metabolic diseases complicating pregnancy, second trimester: Secondary | ICD-10-CM | POA: Diagnosis present

## 2017-12-31 DIAGNOSIS — R112 Nausea with vomiting, unspecified: Secondary | ICD-10-CM | POA: Diagnosis not present

## 2017-12-31 LAB — URINALYSIS, ROUTINE W REFLEX MICROSCOPIC
BILIRUBIN URINE: NEGATIVE
GLUCOSE, UA: NEGATIVE mg/dL
Hgb urine dipstick: NEGATIVE
Ketones, ur: 80 mg/dL — AB
LEUKOCYTES UA: NEGATIVE
NITRITE: NEGATIVE
Protein, ur: 30 mg/dL — AB
SPECIFIC GRAVITY, URINE: 1.021 (ref 1.005–1.030)
pH: 6 (ref 5.0–8.0)

## 2017-12-31 LAB — CBC WITH DIFFERENTIAL/PLATELET
BASOS ABS: 0 10*3/uL (ref 0.0–0.1)
BASOS PCT: 0 %
Eosinophils Absolute: 0 10*3/uL (ref 0.0–0.7)
Eosinophils Relative: 0 %
HCT: 35.3 % — ABNORMAL LOW (ref 36.0–46.0)
Hemoglobin: 11.6 g/dL — ABNORMAL LOW (ref 12.0–15.0)
Lymphocytes Relative: 18 %
Lymphs Abs: 1.3 10*3/uL (ref 0.7–4.0)
MCH: 27 pg (ref 26.0–34.0)
MCHC: 32.9 g/dL (ref 30.0–36.0)
MCV: 82.3 fL (ref 78.0–100.0)
MONO ABS: 0.3 10*3/uL (ref 0.1–1.0)
Monocytes Relative: 5 %
NEUTROS ABS: 5.8 10*3/uL (ref 1.7–7.7)
NEUTROS PCT: 77 %
PLATELETS: 263 10*3/uL (ref 150–400)
RBC: 4.29 MIL/uL (ref 3.87–5.11)
RDW: 14.4 % (ref 11.5–15.5)
WBC: 7.5 10*3/uL (ref 4.0–10.5)

## 2017-12-31 LAB — COMPREHENSIVE METABOLIC PANEL
ALT: 17 U/L (ref 0–44)
ANION GAP: 16 — AB (ref 5–15)
AST: 27 U/L (ref 15–41)
Albumin: 4 g/dL (ref 3.5–5.0)
Alkaline Phosphatase: 68 U/L (ref 38–126)
BUN: 8 mg/dL (ref 6–20)
CHLORIDE: 105 mmol/L (ref 98–111)
CO2: 14 mmol/L — ABNORMAL LOW (ref 22–32)
Calcium: 9.4 mg/dL (ref 8.9–10.3)
Creatinine, Ser: 0.42 mg/dL — ABNORMAL LOW (ref 0.44–1.00)
GFR calc non Af Amer: >60 mL/min (ref >60)
Glucose, Bld: 109 mg/dL — ABNORMAL HIGH (ref 70–99)
POTASSIUM: 3.3 mmol/L — AB (ref 3.5–5.1)
Sodium: 135 mmol/L (ref 135–145)
Total Bilirubin: 1.7 mg/dL — ABNORMAL HIGH (ref 0.3–1.2)
Total Protein: 7.6 g/dL (ref 6.5–8.1)

## 2017-12-31 LAB — TYPE AND SCREEN
ABO/RH(D): O POS
ANTIBODY SCREEN: NEGATIVE

## 2017-12-31 LAB — MAGNESIUM: Magnesium: 1.9 mg/dL (ref 1.7–2.4)

## 2017-12-31 MED ORDER — PROMETHAZINE HCL 25 MG/ML IJ SOLN
25.0000 mg | Freq: Once | INTRAMUSCULAR | Status: AC
  Administered 2017-12-31: 25 mg via INTRAVENOUS
  Filled 2017-12-31: qty 1

## 2017-12-31 MED ORDER — DOCUSATE SODIUM 100 MG PO CAPS
100.0000 mg | ORAL_CAPSULE | Freq: Every day | ORAL | Status: DC
  Administered 2018-01-03 – 2018-01-05 (×3): 100 mg via ORAL
  Filled 2017-12-31 (×4): qty 1

## 2017-12-31 MED ORDER — PROMETHAZINE HCL 25 MG/ML IJ SOLN
12.5000 mg | Freq: Four times a day (QID) | INTRAMUSCULAR | Status: DC | PRN
  Administered 2017-12-31: 12.5 mg via INTRAVENOUS
  Filled 2017-12-31 (×2): qty 1

## 2017-12-31 MED ORDER — ONDANSETRON HCL 4 MG/2ML IJ SOLN
4.0000 mg | Freq: Once | INTRAMUSCULAR | Status: AC
  Administered 2017-12-31: 4 mg via INTRAVENOUS
  Filled 2017-12-31: qty 2

## 2017-12-31 MED ORDER — PRENATAL MULTIVITAMIN CH
1.0000 | ORAL_TABLET | Freq: Every day | ORAL | Status: DC
  Administered 2018-01-03: 1 via ORAL
  Filled 2017-12-31 (×2): qty 1

## 2017-12-31 MED ORDER — SODIUM CHLORIDE 0.9 % IV BOLUS
1000.0000 mL | Freq: Once | INTRAVENOUS | Status: AC
  Administered 2017-12-31: 1000 mL via INTRAVENOUS

## 2017-12-31 MED ORDER — PROMETHAZINE HCL 25 MG RE SUPP
25.0000 mg | Freq: Four times a day (QID) | RECTAL | Status: DC | PRN
  Filled 2017-12-31: qty 1

## 2017-12-31 MED ORDER — METHYLPREDNISOLONE 16 MG PO TABS
16.0000 mg | ORAL_TABLET | Freq: Every day | ORAL | Status: AC
  Administered 2018-01-01 – 2018-01-03 (×3): 16 mg via ORAL
  Filled 2017-12-31 (×3): qty 1

## 2017-12-31 MED ORDER — METHYLPREDNISOLONE 16 MG PO TABS
16.0000 mg | ORAL_TABLET | Freq: Every day | ORAL | Status: AC
  Administered 2018-01-01 – 2018-01-04 (×4): 16 mg via ORAL
  Filled 2017-12-31 (×4): qty 1

## 2017-12-31 MED ORDER — METHYLPREDNISOLONE 4 MG PO TABS: 4.0000 mg | ORAL_TABLET | Freq: Every day | ORAL | Status: DC

## 2017-12-31 MED ORDER — FAMOTIDINE IN NACL 20-0.9 MG/50ML-% IV SOLN
20.0000 mg | Freq: Once | INTRAVENOUS | Status: AC
  Administered 2017-12-31: 20 mg via INTRAVENOUS
  Filled 2017-12-31: qty 50

## 2017-12-31 MED ORDER — ENOXAPARIN SODIUM 40 MG/0.4ML ~~LOC~~ SOLN
40.0000 mg | SUBCUTANEOUS | Status: DC
  Administered 2017-12-31 – 2018-01-07 (×6): 40 mg via SUBCUTANEOUS
  Filled 2017-12-31 (×7): qty 0.4

## 2017-12-31 MED ORDER — METHYLPREDNISOLONE 16 MG PO TABS
16.0000 mg | ORAL_TABLET | Freq: Every day | ORAL | Status: AC
  Administered 2018-01-01 – 2018-01-02 (×2): 16 mg via ORAL
  Filled 2017-12-31 (×2): qty 1

## 2017-12-31 MED ORDER — METOCLOPRAMIDE HCL 5 MG/ML IJ SOLN
10.0000 mg | Freq: Once | INTRAMUSCULAR | Status: AC
  Administered 2017-12-31: 10 mg via INTRAVENOUS
  Filled 2017-12-31: qty 2

## 2017-12-31 MED ORDER — SODIUM CHLORIDE 0.9 % IV BOLUS: 1000.0000 mL | Freq: Once | INTRAVENOUS | Status: DC

## 2017-12-31 MED ORDER — METHYLPREDNISOLONE 4 MG PO TABS
8.0000 mg | ORAL_TABLET | Freq: Every day | ORAL | Status: AC
  Administered 2018-01-03 – 2018-01-05 (×3): 8 mg via ORAL
  Filled 2017-12-31 (×3): qty 2

## 2017-12-31 MED ORDER — METHYLPREDNISOLONE 4 MG PO TABS
4.0000 mg | ORAL_TABLET | Freq: Every day | ORAL | Status: AC
  Administered 2018-01-07: 4 mg via ORAL
  Filled 2017-12-31: qty 1

## 2017-12-31 MED ORDER — PROMETHAZINE HCL 25 MG/ML IJ SOLN
12.5000 mg | Freq: Four times a day (QID) | INTRAMUSCULAR | Status: DC | PRN
  Administered 2017-12-31: 12.5 mg via INTRAVENOUS
  Filled 2017-12-31: qty 1

## 2017-12-31 MED ORDER — METHYLPREDNISOLONE 4 MG PO TABS
8.0000 mg | ORAL_TABLET | Freq: Every day | ORAL | Status: DC
  Administered 2018-01-05 – 2018-01-08 (×4): 8 mg via ORAL
  Filled 2017-12-31 (×4): qty 2

## 2017-12-31 MED ORDER — METHYLPREDNISOLONE 4 MG PO TABS
4.0000 mg | ORAL_TABLET | Freq: Every day | ORAL | Status: DC
  Administered 2018-01-06 – 2018-01-07 (×2): 4 mg via ORAL
  Filled 2017-12-31 (×3): qty 1

## 2017-12-31 MED ORDER — HYDROMORPHONE HCL 1 MG/ML IJ SOLN
0.5000 mg | Freq: Once | INTRAMUSCULAR | Status: AC
  Administered 2017-12-31: 0.5 mg via INTRAVENOUS
  Filled 2017-12-31: qty 0.5

## 2017-12-31 MED ORDER — PROMETHAZINE HCL 25 MG PO TABS: 25.0000 mg | ORAL_TABLET | Freq: Four times a day (QID) | ORAL | Status: DC | PRN

## 2017-12-31 MED ORDER — SODIUM CHLORIDE 0.9 % IV SOLN
INTRAVENOUS | Status: DC
  Administered 2017-12-31 – 2018-01-04 (×12): via INTRAVENOUS
  Administered 2018-01-04: 125 mL/h via INTRAVENOUS

## 2017-12-31 MED ORDER — FAMOTIDINE IN NACL 20-0.9 MG/50ML-% IV SOLN
20.0000 mg | Freq: Two times a day (BID) | INTRAVENOUS | Status: DC
  Administered 2017-12-31 – 2018-01-01 (×2): 20 mg via INTRAVENOUS
  Filled 2017-12-31 (×3): qty 50

## 2017-12-31 MED ORDER — PROMETHAZINE HCL 25 MG PO TABS: 25.0000 mg | ORAL_TABLET | ORAL | Status: DC | PRN

## 2017-12-31 MED ORDER — METHYLPREDNISOLONE SODIUM SUCC 125 MG IJ SOLR
48.0000 mg | Freq: Once | INTRAMUSCULAR | Status: AC
  Administered 2017-12-31: 48 mg via INTRAVENOUS
  Filled 2017-12-31: qty 0.77

## 2017-12-31 MED ORDER — ACETAMINOPHEN 325 MG PO TABS: 650.0000 mg | ORAL_TABLET | ORAL | Status: DC | PRN

## 2017-12-31 MED ORDER — METHYLPREDNISOLONE 4 MG PO TABS
8.0000 mg | ORAL_TABLET | Freq: Every day | ORAL | Status: AC
  Administered 2018-01-04 – 2018-01-06 (×3): 8 mg via ORAL
  Filled 2017-12-31 (×3): qty 2

## 2017-12-31 MED ORDER — ZOLPIDEM TARTRATE 5 MG PO TABS
5.0000 mg | ORAL_TABLET | Freq: Every evening | ORAL | Status: DC | PRN
  Administered 2018-01-01 – 2018-01-03 (×3): 5 mg via ORAL
  Filled 2017-12-31 (×3): qty 1

## 2017-12-31 MED ORDER — CALCIUM CARBONATE ANTACID 500 MG PO CHEW
2.0000 | CHEWABLE_TABLET | ORAL | Status: DC | PRN
  Administered 2018-01-02 – 2018-01-03 (×4): 400 mg via ORAL
  Filled 2017-12-31 (×5): qty 2

## 2017-12-31 NOTE — ED Provider Notes (Signed)
[facility] EMERGENCY DEPARTMENT Provider Note   CSN: [id number] Arrival date & time: 12/31/17  0444     History   Chief Complaint Chief Complaint  Patient presents with  . Emesis During Pregnancy    HPI Deborah Evans is a 22 y.o. female.  Patient presents with intractable nausea and vomiting.  She is approximately [redacted] weeks pregnant.  She has had multiple admissions for hyperemesis gravidarum during this pregnancy.  States she is had intractable vomiting since yesterday.  Emesis started out clear and yellow and is since become "black".  Denies any diarrhea or fever.  She has Zofran, Reglan and Benadryl at home which she has not tried to take in the past 24 hours due to vomiting.  She was admitted to the hospital multiple times and recently discharged on August 25.  States she has not had any issues until tonight.  She is still feeling the baby move.  She denies any vaginal bleeding or discharge.  She denies any pain with urination.  No abdominal pain.  Pregnancy has been confirmed on ultrasound.  Still has appendix and gallbladder.  The history is provided by the patient.    Past Medical History:  Diagnosis Date  . Hyperemesis gravidarum     Patient Active Problem List   Diagnosis Date Noted  . Nausea and vomiting in pregnancy 12/20/2017  . Hyperemesis affecting pregnancy, antepartum 12/03/2017  . Hyperemesis 11/24/2017  . Supervision of normal pregnancy 11/08/2017    Past Surgical History:  Procedure Laterality Date  . DILATION AND CURETTAGE OF UTERUS N/A 07/15/2014   Procedure: SUCTION DILATATION AND CURETTAGE;  Surgeon: Lazaro Arms, MD;  Location: [facility];  Service: Gynecology;  Laterality: N/A;     OB History    Gravida  2   Para      Term      Preterm      AB  1   Living  0     SAB  1   TAB      Ectopic      Multiple      Live Births               Home Medications    Prior to Admission medications   Medication Sig Start Date End Date  Taking? Authorizing Provider  diphenhydrAMINE (BENADRYL) 25 mg capsule Take 1 capsule (25 mg total) by mouth every 8 (eight) hours as needed (nausea, vomiting). 12/23/17   Leslie Bing, MD  famotidine (PEPCID) 20 MG tablet Take 1 tablet (20 mg total) by mouth daily. 12/23/17   Felicity Bing, MD  metoCLOPramide (REGLAN) 10 MG tablet Take 1 tablet (10 mg total) by mouth 3 (three) times daily before meals. 12/23/17   Hawley Bing, MD  ondansetron (ZOFRAN-ODT) 8 MG disintegrating tablet Take 1 tablet (8 mg total) by mouth every 8 (eight) hours as needed for nausea or vomiting. 12/23/17   [place] Bing, MD  Prenatal MV-Min-FA-Omega-3 (PRENATAL GUMMIES/DHA & FA PO) Take 2 tablets by mouth daily.     [provider]  promethazine (PHENERGAN) 25 MG tablet Take 1 tablet (25 mg total) by mouth every 6 (six) hours as needed. Patient not taking: Reported on 12/14/2017 12/09/17   Donnetta Hutching, MD  pyridOXINE (VITAMIN B-6) 25 MG tablet Take 1 tablet (25 mg total) by mouth 2 (two) times daily. 12/23/17   [place] Bing, MD  scopolamine (TRANSDERM-SCOP) 1 MG/3DAYS Place 1 patch (1.5 mg total) onto the skin every 3 (three) days.  12/23/17   [place] Bing, MD    Family History Family History  Problem Relation Age of Onset  . Hypertension Maternal Grandmother   . ADD / ADHD Neg Hx   . Alcohol abuse Neg Hx   . Anxiety disorder Neg Hx   . Arthritis Neg Hx   . Birth defects Neg Hx   . Asthma Neg Hx   . Cancer Neg Hx   . COPD Neg Hx   . Depression Neg Hx   . Diabetes Neg Hx   . Drug abuse Neg Hx   . Early death Neg Hx   . Hearing loss Neg Hx   . Heart disease Neg Hx   . Hyperlipidemia Neg Hx   . Intellectual disability Neg Hx   . Kidney disease Neg Hx   . Learning disabilities Neg Hx   . Miscarriages / Stillbirths Neg Hx   . Obesity Neg Hx   . Stroke Neg Hx   . Vision loss Neg Hx   . Varicose Veins Neg Hx     Social History Social History   Tobacco Use  . Smoking status: Never  Smoker  . Smokeless tobacco: Never Used  Substance Use Topics  . Alcohol use: No  . Drug use: No     Allergies   Patient has no known allergies.   Review of Systems Review of Systems  Constitutional: Positive for activity change and appetite change. Negative for chills and fever.  HENT: Negative for congestion and rhinorrhea.   Respiratory: Negative for cough, chest tightness and shortness of breath.   Cardiovascular: Negative for chest pain.  Gastrointestinal: Positive for nausea and vomiting. Negative for abdominal pain.  Genitourinary: Negative for dysuria, hematuria, vaginal bleeding and vaginal discharge.  Musculoskeletal: Negative for arthralgias and myalgias.  Skin: Negative for wound.  Neurological: Positive for weakness. Negative for dizziness, light-headedness and numbness.   all other systems are negative except as noted in the HPI and PMH.     Physical Exam Updated Vital Signs BP (!) 113/53   Pulse 93   Temp (!) 97.5 F (36.4 C) (Oral)   Resp 17   LMP 08/23/2017 (Approximate)   SpO2 100%   Physical Exam  Constitutional: She is oriented to person, place, and time. She appears well-developed and well-nourished. No distress.  Dry heaving  HENT:  Head: Normocephalic and atraumatic.  Mouth/Throat: Oropharynx is clear and moist. No oropharyngeal exudate.  Dry mucus membranes  Eyes: Pupils are equal, round, and reactive to light. Conjunctivae and EOM are normal.  Neck: Normal range of motion. Neck supple.  No meningismus.  Cardiovascular: Normal rate, regular rhythm, normal heart sounds and intact distal pulses.  No murmur heard. Pulmonary/Chest: Effort normal and breath sounds normal. No respiratory distress.  Abdominal: Soft. There is no tenderness. There is no rebound and no guarding.  Gravid, nontender  Musculoskeletal: Normal range of motion. She exhibits no edema or tenderness.  No CVAT  Neurological: She is alert and oriented to person, place, and  time. No cranial nerve deficit. She exhibits normal muscle tone. Coordination normal.  No ataxia on finger to nose bilaterally. No pronator drift. 5/5 strength throughout. CN 2-12 intact.Equal grip strength. Sensation intact.   Skin: Skin is warm.  Psychiatric: She has a normal mood and affect. Her behavior is normal.  Nursing note and vitals reviewed.    ED Treatments / Results  Labs (all labs ordered are listed, but only abnormal results are displayed) Labs Reviewed  CBC WITH  DIFFERENTIAL/PLATELET - Abnormal; Notable for the following components:      Result Value   Hemoglobin 11.6 (*)    HCT 35.3 (*)    All other components within normal limits  COMPREHENSIVE METABOLIC PANEL - Abnormal; Notable for the following components:   Potassium 3.3 (*)    CO2 14 (*)    Glucose, Bld 109 (*)    Creatinine, Ser 0.42 (*)    Total Bilirubin 1.7 (*)    Anion gap 16 (*)    All other components within normal limits  MAGNESIUM  URINALYSIS, ROUTINE W REFLEX MICROSCOPIC    EKG None  Radiology No results found.  Procedures Procedures (including critical care time)  Medications Ordered in ED Medications  sodium chloride 0.9 % bolus 1,000 mL (1,000 mLs Intravenous New Bag/Given 12/31/17 0509)  famotidine (PEPCID) IVPB 20 mg premix (20 mg Intravenous New Bag/Given 12/31/17 0514)  promethazine (PHENERGAN) injection 25 mg (25 mg Intravenous Given 12/31/17 0510)  metoCLOPramide (REGLAN) injection 10 mg (10 mg Intravenous Given 12/31/17 0514)     Initial Impression / Assessment and Plan / ED Course  I have reviewed the triage vital signs and the nursing notes.  Pertinent labs & imaging results that were available during my care of the patient were reviewed by me and considered in my medical decision making (see chart for details).    Nausea and vomiting in the setting of pregnancy.  Multiple admissions for hyperemesis.  Patient appears dry.  She has no abdominal pain or vaginal bleeding.  She  will be hydrated, labs will be obtained, symptom control given.  Fetal heart tones 134.  Patient given IV fluids, Reglan, Phenergan, Zofran and Pepcid. Labs show metabolic acidosis with elevated anion gap.  Likely starvation ketosis.  Urine still pending.  Patient continues to have nausea and dry heaving but has not had any witnessed vomiting in the ED. She feels that which she will be unable to go home.  Transfer to MAU for further treatment and possible admission discussed with Dr. Debroah Loop.  He approves use of IV Zofran at this point of the pregnancy. Final Clinical Impressions(s) / ED Diagnoses   Final diagnoses:  Hyperemesis gravidarum    ED Discharge Orders    None       Prabhnoor Ellenberger, Jeannett Senior, MD 12/31/17 [phone]

## 2017-12-31 NOTE — ED Triage Notes (Signed)
Pt c/o n/v that became worse today, pt is currently 23 weeks 6 days pregnant and has been admitted several times for hyperemesis

## 2017-12-31 NOTE — ED Notes (Signed)
Pt cannot provide sample at this time.

## 2017-12-31 NOTE — ED Notes (Signed)
Fetal heart tones 134

## 2017-12-31 NOTE — ED Notes (Signed)
[facility] in to transport 

## 2018-01-01 LAB — GLUCOSE, CAPILLARY: Glucose-Capillary: 94 mg/dL (ref 70–99)

## 2018-01-01 LAB — OCCULT BLOOD GASTRIC / DUODENUM (SPECIMEN CUP): OCCULT BLOOD, GASTRIC: POSITIVE — AB

## 2018-01-01 LAB — ABO/RH: ABO/RH(D): O POS

## 2018-01-01 MED ORDER — PROMETHAZINE HCL 25 MG RE SUPP
25.0000 mg | Freq: Four times a day (QID) | RECTAL | Status: DC | PRN
  Filled 2018-01-01: qty 1

## 2018-01-01 MED ORDER — PROMETHAZINE HCL 25 MG PO TABS: 25.0000 mg | ORAL_TABLET | ORAL | Status: DC | PRN

## 2018-01-01 MED ORDER — METOCLOPRAMIDE HCL 5 MG/ML IJ SOLN
10.0000 mg | Freq: Four times a day (QID) | INTRAMUSCULAR | Status: DC | PRN
  Administered 2018-01-01 – 2018-01-04 (×9): 10 mg via INTRAVENOUS
  Filled 2018-01-01 (×9): qty 2

## 2018-01-01 MED ORDER — PROMETHAZINE HCL 25 MG/ML IJ SOLN
25.0000 mg | INTRAMUSCULAR | Status: DC | PRN
  Administered 2018-01-01 – 2018-01-03 (×9): 25 mg via INTRAVENOUS
  Filled 2018-01-01 (×9): qty 1

## 2018-01-01 MED ORDER — PANTOPRAZOLE SODIUM 20 MG PO TBEC
20.0000 mg | DELAYED_RELEASE_TABLET | Freq: Every day | ORAL | Status: DC
  Administered 2018-01-01 – 2018-01-02 (×2): 20 mg via ORAL
  Filled 2018-01-01 (×3): qty 1

## 2018-01-01 MED ORDER — PROMETHAZINE HCL 25 MG RE SUPP
25.0000 mg | RECTAL | Status: DC | PRN
  Filled 2018-01-01: qty 1

## 2018-01-01 MED ORDER — PROMETHAZINE HCL 25 MG/ML IJ SOLN
12.5000 mg | Freq: Once | INTRAMUSCULAR | Status: AC
  Administered 2018-01-01: 12.5 mg via INTRAVENOUS
  Filled 2018-01-01: qty 1

## 2018-01-01 MED ORDER — GI COCKTAIL ~~LOC~~
30.0000 mL | Freq: Once | ORAL | Status: AC
  Administered 2018-01-01: 30 mL via ORAL
  Filled 2018-01-01: qty 30

## 2018-01-01 MED ORDER — ONDANSETRON 4 MG PO TBDP
4.0000 mg | ORAL_TABLET | Freq: Four times a day (QID) | ORAL | Status: DC | PRN
  Administered 2018-01-01 – 2018-01-04 (×5): 4 mg via ORAL
  Filled 2018-01-01 (×6): qty 1

## 2018-01-01 MED ORDER — PROMETHAZINE HCL 25 MG/ML IJ SOLN
12.5000 mg | INTRAMUSCULAR | Status: DC | PRN
  Administered 2018-01-01: 12.5 mg via INTRAVENOUS

## 2018-01-01 NOTE — H&P (Addendum)
FACULTY PRACTICE ANTEPARTUM ADMISSION HISTORY AND PHYSICAL NOTE   History of Present Illness: Deborah Evans is a 22 y.o. G2P0010 at [redacted]w[redacted]d This is her third admission for Hyperemesis. She admitted for recurrent N&V .  She reports 30 lb wt loss this pregnancy, no drooling Patient reports the fetal movement as active. Patient reports uterine contraction  activity as none. Patient reports  vaginal bleeding as none. Patient describes fluid per vagina as None. Fetal presentation is unsure.  Patient Active Problem List   Diagnosis Date Noted  . Hyperemesis gravidarum with dehydration 12/31/2017  . Nausea and vomiting in pregnancy 12/20/2017  . Hyperemesis affecting pregnancy, antepartum 12/03/2017  . Hyperemesis 11/24/2017  . Supervision of normal pregnancy 11/08/2017    Past Medical History:  Diagnosis Date  . Hyperemesis gravidarum     Past Surgical History:  Procedure Laterality Date  . DILATION AND CURETTAGE OF UTERUS N/A 07/15/2014   Procedure: SUCTION DILATATION AND CURETTAGE;  Surgeon: Lazaro Arms, MD;  Location: [facility];  Service: Gynecology;  Laterality: N/A;    OB History  Gravida Para Term Preterm AB Living  2       1 0  SAB TAB Ectopic Multiple Live Births  1            # Outcome Date GA Lbr Len/2nd Weight Sex Delivery Anes PTL Lv  2 Current           1 SAB             Social History   Socioeconomic History  . Marital status: Single    Spouse name: Not on file  . Number of children: Not on file  . Years of education: Not on file  . Highest education level: Not on file  Occupational History  . Not on file  Social Needs  . Financial resource strain: Not on file  . Food insecurity:    Worry: Not on file    Inability: Not on file  . Transportation needs:    Medical: Not on file    Non-medical: Not on file  Tobacco Use  . Smoking status: Never Smoker  . Smokeless tobacco: Never Used  Substance and Sexual Activity  . Alcohol use: No  . Drug use: No   . Sexual activity: Yes    Birth control/protection: None  Lifestyle  . Physical activity:    Days per week: Not on file    Minutes per session: Not on file  . Stress: Not on file  Relationships  . Social connections:    Talks on phone: Not on file    Gets together: Not on file    Attends religious service: Not on file    Active member of club or organization: Not on file    Attends meetings of clubs or organizations: Not on file    Relationship status: Not on file  Other Topics Concern  . Not on file  Social History Narrative  . Not on file    Family History  Problem Relation Age of Onset  . Hypertension Maternal Grandmother   . ADD / ADHD Neg Hx   . Alcohol abuse Neg Hx   . Anxiety disorder Neg Hx   . Arthritis Neg Hx   . Birth defects Neg Hx   . Asthma Neg Hx   . Cancer Neg Hx   . COPD Neg Hx   . Depression Neg Hx   . Diabetes Neg Hx   . Drug abuse Neg  Hx   . Early death Neg Hx   . Hearing loss Neg Hx   . Heart disease Neg Hx   . Hyperlipidemia Neg Hx   . Intellectual disability Neg Hx   . Kidney disease Neg Hx   . Learning disabilities Neg Hx   . Miscarriages / Stillbirths Neg Hx   . Obesity Neg Hx   . Stroke Neg Hx   . Vision loss Neg Hx   . Varicose Veins Neg Hx     No Known Allergies  Medications Prior to Admission  Medication Sig Dispense Refill Last Dose  . diphenhydrAMINE (BENADRYL) 25 mg capsule Take 1 capsule (25 mg total) by mouth every 8 (eight) hours as needed (nausea, vomiting). 60 capsule 1 Past Week at Unknown time  . famotidine (PEPCID) 20 MG tablet Take 1 tablet (20 mg total) by mouth daily. 30 tablet 3 Past Week at Unknown time  . metoCLOPramide (REGLAN) 10 MG tablet Take 1 tablet (10 mg total) by mouth 3 (three) times daily before meals. 90 tablet 1 Past Week at Unknown time  . ondansetron (ZOFRAN-ODT) 8 MG disintegrating tablet Take 1 tablet (8 mg total) by mouth every 8 (eight) hours as needed for nausea or vomiting. 45 tablet 1 Past  Week at Unknown time  . Prenatal MV-Min-FA-Omega-3 (PRENATAL GUMMIES/DHA & FA PO) Take 2 tablets by mouth daily.    Past Week at Unknown time  . pyridOXINE (VITAMIN B-6) 25 MG tablet Take 1 tablet (25 mg total) by mouth 2 (two) times daily. 60 tablet 3 Past Week at Unknown time  . scopolamine (TRANSDERM-SCOP) 1 MG/3DAYS Place 1 patch (1.5 mg total) onto the skin every 3 (three) days. 10 patch 1 Past Week at Unknown time  . promethazine (PHENERGAN) 25 MG tablet Take 1 tablet (25 mg total) by mouth every 6 (six) hours as needed. (Patient not taking: Reported on 12/14/2017) 15 tablet 0 Not Taking at Unknown time    Review of Systems - Gastrointestinal ROS: no abdominal pain, change in bowel habits, or black or bloody stools negative for - blood in stools She reports dark bilious vomiting Vitals:  BP 122/79 (BP Location: Left Arm)   Pulse 92   Temp 99 F (37.2 C) (Oral)   Resp 16   Ht 5\' 8"  (1.727 m)   Wt 66.7 kg   LMP 08/23/2017 (Approximate)   SpO2 100%   BMI 22.36 kg/m  Physical Examination: CONSTITUTIONAL: Well-developed, adequate hydrated female in mild distress.  HENT:  Normocephalic, atraumatic, External right and left ear normal. Oropharynx is clear and moist EYES: Conjunctivae and EOM are normal. Pupils are equal, round, and reactive to light. No scleral icterus.  NECK: Normal range of motion, supple, no masses SKIN: Skin is warm and dry. No rash noted. Not diaphoretic. No erythema. No pallor. NEUROLGIC: Alert and oriented to person, place, and time. Normal reflexes, muscle tone coordination. No cranial nerve deficit noted. PSYCHIATRIC: Normal mood and affect. Normal behavior. Normal judgment and thought content. CARDIOVASCULAR: Normal heart rate noted, regular rhythm RESPIRATORY: Effort and breath sounds normal, no problems with respiration noted ABDOMEN: Soft, nontender, nondistended, gravid consistent with 24 wk.             Had anatomy scan essentially normal at 18 wk, LV EICF  only MUSCULOSKELETAL: Normal range of motion. No edema and no tenderness. 2+ distal pulses.  Fetal Monitoring:normal by doppler Tocometer: Flat  Labs:  Results for orders placed or performed during the hospital encounter of  12/31/17 (from the past 24 hour(s))  Urinalysis, Routine w reflex microscopic   Collection Time: 12/31/17  8:00 AM  Result Value Ref Range   Color, Urine YELLOW YELLOW   APPearance HAZY (A) CLEAR   Specific Gravity, Urine 1.021 1.005 - 1.030   pH 6.0 5.0 - 8.0   Glucose, UA NEGATIVE NEGATIVE mg/dL   Hgb urine dipstick NEGATIVE NEGATIVE   Bilirubin Urine NEGATIVE NEGATIVE   Ketones, ur 80 (A) NEGATIVE mg/dL   Protein, ur 30 (A) NEGATIVE mg/dL   Nitrite NEGATIVE NEGATIVE   Leukocytes, UA NEGATIVE NEGATIVE   RBC / HPF 0-5 0 - 5 RBC/hpf   WBC, UA 6-10 0 - 5 WBC/hpf   Bacteria, UA FEW (A) NONE SEEN   Squamous Epithelial / LPF 6-10 0 - 5   Mucus PRESENT   Type and screen [facility] [place]   Collection Time: 12/31/17  6:38 PM  Result Value Ref Range   ABO/RH(D) O POS    Antibody Screen NEG    Sample Expiration      01/03/2018 Performed at [facility], [address], [place], [place] [address]   ABO/Rh   Collection Time: 12/31/17  6:38 PM  Result Value Ref Range   ABO/RH(D)      O POS Performed at [facility], [address] [place], [place] [address]   Glucose, capillary   Collection Time: 01/01/18  5:15 AM  Result Value Ref Range   Glucose-Capillary 94 70 - 99 mg/dL   Comment 1 Notify RN     Imaging Studies: US Abdomen Complete  Result Date: 12/03/2017 CLINICAL DATA:  Chronic vomiting. EXAM: ABDOMEN ULTRASOUND COMPLETE COMPARISON:  None. FINDINGS: Gallbladder: Mild echogenic sludge and stones are seen within the gallbladder. No gallbladder wall thickening or pericholecystic fluid is seen. No ultrasonographic Murphy's sign is elicited. Common bile duct: Diameter: 0.2 cm, within normal limits in caliber. Liver: No focal lesion  identified. Within normal limits in parenchymal echogenicity. Portal vein is patent on color Doppler imaging with normal direction of blood flow towards the liver. IVC: No abnormality visualized. Pancreas: Visualized portion unremarkable. Spleen: Size and appearance within normal limits. A simple 1.4 cm cyst is noted within the spleen. Right Kidney: Length: 13.1 cm. Echogenicity within normal limits. No mass or hydronephrosis visualized. Left Kidney: Length: 11.7 cm. Echogenicity within normal limits. No mass or hydronephrosis visualized. Abdominal aorta: No aneurysm visualized. Other findings: None. IMPRESSION: 1. No acute abnormality seen to explain the patient's symptoms. 2. Mild echogenic sludge and stones within the gallbladder. No evidence for cholecystitis. 3. Small splenic cyst noted. Electronically Signed   By: Roanna Raider M.D.   On: 12/03/2017 00:32     Assessment and Plan: Patient Active Problem List   Diagnosis Date Noted  . Hyperemesis gravidarum with dehydration 12/31/2017  . Nausea and vomiting in pregnancy 12/20/2017  . Hyperemesis affecting pregnancy, antepartum 12/03/2017  . Hyperemesis 11/24/2017  . Supervision of normal pregnancy 11/08/2017   Admit to Antenatal Reglan Phenergan IV not especially effective will add zofran Steroid taper.   Tilda Burrow  Attending Obstetrician & Gynecologist Faculty Practice, [facility]

## 2018-01-01 NOTE — Progress Notes (Signed)
Patient complains of chest pain, directly in midline of sternum. Feels it most when she wakes up and feels sharp. Has not had pain like this before. Still not feeling well in general and "throwing up everything."  BP 121/78 (BP Location: Left Arm)   Pulse 98   Temp 99.2 F (37.3 C) (Oral)   Resp 18   Ht 5\' 8"  (1.727 m)   Wt 64.8 kg   LMP 08/23/2017 (Approximate)   SpO2 98%   BMI 21.71 kg/m  Gen: alert, oriented, appears fatigued CVS: pain mildy reproducible over sternum at midline, significantly tender in epigastric area  A/P: Suspect GERD, will switch to protonix as pepcid does not appear to be improving her pain. Her pain should improve as her nausea lessens, which should continue to improve on steroid taper. She is agreeable.   Baldemar Lenis, M.D. [facility]

## 2018-01-01 NOTE — Plan of Care (Signed)
  Problem: Elimination: Goal: Will not experience complications related to bowel motility Outcome: Progressing Goal: Will not experience complications related to urinary retention Outcome: Progressing Note:  Pt has voided 3 times this afternoon.  Denies urinary sympotoms.   Problem: Pain Managment: Goal: General experience of comfort will improve Outcome: Progressing   Problem: Safety: Goal: Ability to remain free from injury will improve Outcome: Progressing   Problem: Education: Goal: Knowledge of disease or condition will improve Outcome: Progressing Goal: Knowledge of the prescribed therapeutic regimen will improve Outcome: Progressing Note:  Pt has required around the clock dosing of phenergan, zofran, & reglan.  States nothing has relieved nausea.   Problem: Bowel/Gastric: Goal: Occurences of nausea and/or vomiting will decrease Outcome: Progressing Note:  Pt has had unrelieved nausea whole shift & 3 occurrences of vomiting.     Problem: Fluid Volume: Goal: Maintenance of adequate hydration will improve Outcome: Progressing   Problem: Nutritional: Goal: Achievement of adequate weight for body size and type will improve Outcome: Progressing

## 2018-01-01 NOTE — Plan of Care (Signed)
Pt. Reports 3 bouts of emesis this afternoon/evening. Occult positive. Dr. Jolayne Panther paged and aware. Pt. Complaint of heartburn this Pm. GI cocktail ordered, will administer with IV promethzine to avoid nausea. IV promethazine due around 9 pm. Will give then. Pt. Aware. Will also give ambien to promote sleep and ability for GI cocktail to work. Dr. Jolayne Panther aware of plan. Will monitor.

## 2018-01-01 NOTE — Progress Notes (Addendum)
FACULTY PRACTICE ANTEPARTUM(COMPREHENSIVE) NOTE  Deborah Evans is a 22 y.o. G2P0010 at [redacted]w[redacted]d by early ultrasound who is admitted for hyperemesis..   Fetal presentation is unsure. Length of Stay:  1  Days  Subjective: Pt not helped by IV phenergan, has been increased to 25 mg iv q 4, Zofran admitted  Patient reports the fetal movement as active. Patient reports uterine contraction  activity as none. Patient reports  vaginal bleeding as none. Patient describes fluid per vagina as None. Filed Weights   12/31/17 0934  Weight: 66.7 kg    Vitals:  Blood pressure 122/79, pulse 92, temperature 99 F (37.2 C), temperature source Oral, resp. rate 16, height 5\' 8"  (1.727 m), weight 66.7 kg, last menstrual period 08/23/2017, SpO2 100 %, unknown if currently breastfeeding. Physical Examination:  General appearance - alert, well appearing, and in no distress, normal appearing weight, in mild to moderate distress and ill-appearing Heart - normal rate and regular rhythm Abdomen - soft, nontender, nondistended Fundal Height:  size equals dates  . Extremities: extremities normal, atraumatic, no cyanosis or edema and Homans sign is negative, no sign of DVT with DTRs 2+ bilaterally Membranes:intact  Fetal Monitoring:  doppler normal  Labs:  Results for orders placed or performed during the hospital encounter of 12/31/17 (from the past 24 hour(s))  Urinalysis, Routine w reflex microscopic   Collection Time: 12/31/17  8:00 AM  Result Value Ref Range   Color, Urine YELLOW YELLOW   APPearance HAZY (A) CLEAR   Specific Gravity, Urine 1.021 1.005 - 1.030   pH 6.0 5.0 - 8.0   Glucose, UA NEGATIVE NEGATIVE mg/dL   Hgb urine dipstick NEGATIVE NEGATIVE   Bilirubin Urine NEGATIVE NEGATIVE   Ketones, ur 80 (A) NEGATIVE mg/dL   Protein, ur 30 (A) NEGATIVE mg/dL   Nitrite NEGATIVE NEGATIVE   Leukocytes, UA NEGATIVE NEGATIVE   RBC / HPF 0-5 0 - 5 RBC/hpf   WBC, UA 6-10 0 - 5 WBC/hpf   Bacteria, UA  FEW (A) NONE SEEN   Squamous Epithelial / LPF 6-10 0 - 5   Mucus PRESENT   Type and screen [facility] [place]   Collection Time: 12/31/17  6:38 PM  Result Value Ref Range   ABO/RH(D) O POS    Antibody Screen NEG    Sample Expiration      01/03/2018 Performed at [facility], [address] [place], [place] [address]   ABO/Rh   Collection Time: 12/31/17  6:38 PM  Result Value Ref Range   ABO/RH(D)      O POS Performed at [facility], [address] [place], [place] [address]   Glucose, capillary   Collection Time: 01/01/18  5:15 AM  Result Value Ref Range   Glucose-Capillary 94 70 - 99 mg/dL   Comment 1 Notify RN     Imaging Studies:     Currently EPIC will not allow sonographic studies to automatically populate into notes.  In the meantime, copy and paste results into note or free text.  Medications:  Scheduled . docusate sodium  100 mg Oral Daily  . enoxaparin (LOVENOX) injection  40 mg Subcutaneous Q24H  . methylPREDNISolone  16 mg Oral Q breakfast   Followed by  . [START ON 01/05/2018] methylPREDNISolone  8 mg Oral Q breakfast   Followed by  . [START ON 01/12/2018] methylPREDNISolone  4 mg Oral Q breakfast  . methylPREDNISolone  16 mg Oral Q1400   Followed by  . [START ON 01/03/2018]  methylPREDNISolone  8 mg Oral Q1400   Followed by  . [START ON 01/06/2018] methylPREDNISolone  4 mg Oral Q1400  . methylPREDNISolone  16 mg Oral QHS   Followed by  . [START ON 01/04/2018] methylPREDNISolone  8 mg Oral QHS   Followed by  . [START ON 01/07/2018] methylPREDNISolone  4 mg Oral QHS  . prenatal multivitamin  1 tablet Oral Q1200   I have reviewed the patient's current medications. zofran and reglan added  ASSESSMENT: Patient Active Problem List   Diagnosis Date Noted  . Hyperemesis gravidarum with dehydration 12/31/2017  . Nausea and vomiting in pregnancy 12/20/2017  . Hyperemesis affecting pregnancy, antepartum 12/03/2017  . Hyperemesis 11/24/2017  .  Supervision of normal pregnancy 11/08/2017    PLAN: Continue steroid taper, on clear liq, reglan IV,  zofran ODT,  Hemoccult test of vomitus. ( must be sent out) Tilda Burrow 01/01/2018,7:44 AM    Patient ID: Lebron Quam, female   DOB: March 02, 1996, 22 y.o.   MRN: [id number]

## 2018-01-02 DIAGNOSIS — Z3A24 24 weeks gestation of pregnancy: Secondary | ICD-10-CM

## 2018-01-02 DIAGNOSIS — O212 Late vomiting of pregnancy: Secondary | ICD-10-CM

## 2018-01-02 DIAGNOSIS — K219 Gastro-esophageal reflux disease without esophagitis: Secondary | ICD-10-CM | POA: Clinically undetermined

## 2018-01-02 LAB — GLUCOSE, CAPILLARY: GLUCOSE-CAPILLARY: 89 mg/dL (ref 70–99)

## 2018-01-02 MED ORDER — PANTOPRAZOLE SODIUM 20 MG PO TBEC
20.0000 mg | DELAYED_RELEASE_TABLET | Freq: Two times a day (BID) | ORAL | Status: DC
  Administered 2018-01-02 – 2018-01-08 (×12): 20 mg via ORAL
  Filled 2018-01-02 (×12): qty 1

## 2018-01-02 MED ORDER — FAMOTIDINE IN NACL 20-0.9 MG/50ML-% IV SOLN
20.0000 mg | Freq: Two times a day (BID) | INTRAVENOUS | Status: DC
  Administered 2018-01-02 – 2018-01-05 (×6): 20 mg via INTRAVENOUS
  Filled 2018-01-02 (×7): qty 50

## 2018-01-02 NOTE — Progress Notes (Signed)
FACULTY PRACTICE ANTEPARTUM PROGRESS NOTE  Deborah Evans is a 22 y.o. G2P0010 at [redacted]w[redacted]d who is admitted for hyperemesis.  Estimated Date of Delivery: 04/23/18 Fetal presentation is unsure.  Length of Stay:  2 Days. Admitted 12/31/2017  Subjective: Patient reports normal fetal movement.  She denies uterine contractions, denies bleeding and leaking of fluid per vagina. She is still nauseous and not feeling well. Her abdominal and chest pain is mildly improved.   Vitals:  Blood pressure 128/79, pulse 92, temperature 98.5 F (36.9 C), temperature source Oral, resp. rate 18, height 5\' 8"  (1.727 m), weight 64.3 kg, last menstrual period 08/23/2017, SpO2 100 %, unknown if currently breastfeeding. Physical Examination: CONSTITUTIONAL: Well-developed, well-nourished female in no acute distress. Appears fatigued HENT:  Normocephalic, atraumatic, External right and left ear normal. Oropharynx is clear and moist EYES: Conjunctivae and EOM are normal. Pupils are equal, round, and reactive to light. No scleral icterus.  NECK: Normal range of motion, supple, no masses. SKIN: Skin is warm and dry. No rash noted. Not diaphoretic. No erythema. No pallor. NEUROLGIC: Alert and oriented to person, place, and time. Normal reflexes, muscle tone coordination. No cranial nerve deficit noted. PSYCHIATRIC: Normal mood and affect. Normal behavior. Normal judgment and thought content. CARDIOVASCULAR: Normal heart rate noted, regular rhythm RESPIRATORY: Effort and breath sounds normal, no problems with respiration noted MUSCULOSKELETAL: Normal range of motion. No edema and no tenderness. ABDOMEN: Soft, nontender, nondistended, gravid. CERVIX: deferred  Fetal monitoring: doppler 154 bpm   No results found.  Current scheduled medications . docusate sodium  100 mg Oral Daily  . enoxaparin (LOVENOX) injection  40 mg Subcutaneous Q24H  . methylPREDNISolone  16 mg Oral Q breakfast   Followed by  . [START ON 01/05/2018]  methylPREDNISolone  8 mg Oral Q breakfast   Followed by  . [START ON 01/12/2018] methylPREDNISolone  4 mg Oral Q breakfast  . methylPREDNISolone  16 mg Oral Q1400   Followed by  . [START ON 01/03/2018] methylPREDNISolone  8 mg Oral Q1400   Followed by  . [START ON 01/06/2018] methylPREDNISolone  4 mg Oral Q1400  . methylPREDNISolone  16 mg Oral QHS   Followed by  . [START ON 01/04/2018] methylPREDNISolone  8 mg Oral QHS   Followed by  . [START ON 01/07/2018] methylPREDNISolone  4 mg Oral QHS  . pantoprazole  20 mg Oral Daily  . prenatal multivitamin  1 tablet Oral Q1200    I have reviewed the patient's current medications.  ASSESSMENT: Principal Problem:   Hyperemesis gravidarum with dehydration Active Problems:   Supervision of normal pregnancy   GERD (gastroesophageal reflux disease)   PLAN: Cont IV phenergan, zofran, reglan, protonix Cont steroid taper Cont IV fluids   Continue routine antenatal care.   Baldemar Lenis, M.D. [facility]  01/02/2018 1:31 PM

## 2018-01-03 LAB — COMPREHENSIVE METABOLIC PANEL
ALBUMIN: 2.9 g/dL — AB (ref 3.5–5.0)
ALK PHOS: 51 U/L (ref 38–126)
ALT: 24 U/L (ref 0–44)
ANION GAP: 10 (ref 5–15)
AST: 32 U/L (ref 15–41)
BUN: <5 mg/dL — ABNORMAL LOW (ref 6–20)
CALCIUM: 8.1 mg/dL — AB (ref 8.9–10.3)
CO2: 17 mmol/L — ABNORMAL LOW (ref 22–32)
Chloride: 105 mmol/L (ref 98–111)
Creatinine, Ser: 0.33 mg/dL — ABNORMAL LOW (ref 0.44–1.00)
GFR calc Af Amer: >60 mL/min (ref >60)
GFR calc non Af Amer: >60 mL/min (ref >60)
GLUCOSE: 92 mg/dL (ref 70–99)
POTASSIUM: 2.9 mmol/L — AB (ref 3.5–5.1)
Sodium: 132 mmol/L — ABNORMAL LOW (ref 135–145)
Total Bilirubin: 1.5 mg/dL — ABNORMAL HIGH (ref 0.3–1.2)
Total Protein: 5.6 g/dL — ABNORMAL LOW (ref 6.5–8.1)

## 2018-01-03 LAB — GLUCOSE, CAPILLARY: GLUCOSE-CAPILLARY: 89 mg/dL (ref 70–99)

## 2018-01-03 LAB — MAGNESIUM: Magnesium: 1.6 mg/dL — ABNORMAL LOW (ref 1.7–2.4)

## 2018-01-03 MED ORDER — SUCRALFATE 1 GM/10ML PO SUSP
1.0000 g | Freq: Three times a day (TID) | ORAL | Status: DC
  Administered 2018-01-03 – 2018-01-08 (×21): 1 g via ORAL
  Filled 2018-01-03 (×22): qty 10

## 2018-01-03 NOTE — Progress Notes (Signed)
Initial Nutrition Assessment  DOCUMENTATION CODES:  Not applicable  INTERVENTION:  Currently ordered C/L diet Order Ensure Enlive vanilla BID when pt able to tol liquids  NUTRITION DIAGNOSIS:  Inadequate oral intake related to nausea, vomiting as evidenced by percent weight loss.  GOAL:  Patient will meet greater than or equal to 90% of their needs, Weight gain  MONITOR:  Weight trends  REASON FOR ASSESSMENT:  Antenatal, Other (Comment)(reoccurring hyperemesis)   ASSESSMENT:  24 2/7 weeks, GERD, hyperemesis. 10 MAU/ED/hosp admissions. pre-preg weight 169 lbs. to date a 30 lb weight loss. undergoing steroid taper  Diet Order:   Diet Order            Diet clear liquid Room service appropriate? Yes; Fluid consistency: Thin  Diet effective now              EDUCATION NEEDS:   No education needs have been identified at this time  Skin:  Skin Assessment: Reviewed RN Assessment  Height:   Ht Readings from Last 1 Encounters:  12/31/17 5\' 8"  (1.727 m)    Weight:   Wt Readings from Last 1 Encounters:  01/03/18 62.9 kg   Ideal Body Weight:   140 lbs  BMI:  Body mass index is 21.1 kg/m.  Estimated Nutritional Needs:   Kcal:  1900-2100  Protein:  85-95 g  Fluid:  2.2L  Elisabeth Cara M.Odis Luster LDN Neonatal Nutrition Support Specialist/RD III Pager [phone]      Phone [phone]

## 2018-01-03 NOTE — Progress Notes (Signed)
Patient ID: Deborah Evans, female   DOB: Nov 09, 1995, 22 y.o.   MRN: [id number] FACULTY PRACTICE ANTEPARTUM(COMPREHENSIVE) NOTE  Deborah Evans is a 22 y.o. G2P0010 with Estimated Date of Delivery: 04/23/18    [redacted]w[redacted]d  who is admitted for recurrent hyperemesis gravidarum, ptyalism .    Fetal presentation is unsure. Length of Stay:  3  Days  Date of admission:12/31/2017  Subjective: Continues to have some emesis although better Main complaint is GERD/esophageal spasm symptoms, will try carafate Patient reports the fetal movement as active. Patient reports uterine contraction  activity as none. Patient reports  vaginal bleeding as none. Patient describes fluid per vagina as None.  Vitals:  Blood pressure (!) 110/59, pulse 86, temperature 98.8 F (37.1 C), temperature source Oral, resp. rate 18, height 5\' 8"  (1.727 m), weight 62.9 kg, last menstrual period 08/23/2017, SpO2 100 %, unknown if currently breastfeeding. Vitals:   01/02/18 1950 01/02/18 2354 01/03/18 0410 01/03/18 0500  BP: 100/68 116/76 (!) 110/59   Pulse: (!) 102 94 86   Resp: 20 18 18    Temp: 98.6 F (37 C) 99.2 F (37.3 C) 98.8 F (37.1 C)   TempSrc: Oral Oral Oral   SpO2: 99% 100% 100%   Weight:    62.9 kg  Height:       Physical Examination:  General appearance - looks like she does not feel well Abdomen - benign Fundal Height:  size equals dates Extremities: extremities normal, atraumatic, no cyanosis or edema with DTRs  Membranes:intact  Fetal Monitoring:  Normal dopplers     Labs:  Results for orders placed or performed during the hospital encounter of 12/31/17 (from the past 24 hour(s))  Glucose, capillary   Collection Time: 01/03/18  5:33 AM  Result Value Ref Range   Glucose-Capillary 89 70 - 99 mg/dL    Imaging Studies:     Medications:  Scheduled . docusate sodium  100 mg Oral Daily  . enoxaparin (LOVENOX) injection  40 mg Subcutaneous Q24H  . methylPREDNISolone  16 mg Oral Q breakfast   Followed by  . [START ON 01/05/2018] methylPREDNISolone  8 mg Oral Q breakfast   Followed by  . [START ON 01/12/2018] methylPREDNISolone  4 mg Oral Q breakfast  . methylPREDNISolone  16 mg Oral QHS   Followed by  . [START ON 01/04/2018] methylPREDNISolone  8 mg Oral QHS   Followed by  . [START ON 01/07/2018] methylPREDNISolone  4 mg Oral QHS  . methylPREDNISolone  8 mg Oral Q1400   Followed by  . [START ON 01/06/2018] methylPREDNISolone  4 mg Oral Q1400  . pantoprazole  20 mg Oral BID  . prenatal multivitamin  1 tablet Oral Q1200  . sucralfate  1 g Oral TID WC & HS   I have reviewed the patient's current medications.  ASSESSMENT: G2P0010 [redacted]w[redacted]d Estimated Date of Delivery: 04/23/18  Patient Active Problem List   Diagnosis Date Noted  . GERD (gastroesophageal reflux disease) 01/02/2018  . Hyperemesis gravidarum with dehydration 12/31/2017  . Nausea and vomiting in pregnancy 12/20/2017  . Hyperemesis affecting pregnancy, antepartum 12/03/2017  . Hyperemesis 11/24/2017  . 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

## 2018-01-04 ENCOUNTER — Inpatient Hospital Stay (HOSPITAL_COMMUNITY): Payer: Medicaid Other

## 2018-01-04 LAB — GLUCOSE, CAPILLARY: GLUCOSE-CAPILLARY: 83 mg/dL (ref 70–99)

## 2018-01-04 MED ORDER — SODIUM CHLORIDE 0.9 % IV SOLN
Freq: Once | INTRAVENOUS | Status: DC
  Filled 2018-01-04: qty 1000

## 2018-01-04 MED ORDER — IOPAMIDOL (ISOVUE-300) INJECTION 61%
10.0000 mL | Freq: Once | INTRAVENOUS | Status: AC | PRN
  Administered 2018-01-04: 10 mL via ORAL

## 2018-01-04 MED ORDER — LIDOCAINE VISCOUS HCL 2 % MT SOLN
3.0000 mL | Freq: Once | OROMUCOSAL | Status: AC
  Administered 2018-01-04: 3 mL via OROMUCOSAL
  Filled 2018-01-04: qty 5

## 2018-01-04 MED ORDER — SODIUM CHLORIDE 0.9 % IV SOLN
Freq: Once | INTRAVENOUS | Status: AC
  Administered 2018-01-04: 13:00:00 via INTRAVENOUS
  Filled 2018-01-04: qty 1000

## 2018-01-04 MED ORDER — SODIUM CHLORIDE 0.9 % IV SOLN
INTRAVENOUS | Status: DC
  Administered 2018-01-04 – 2018-01-05 (×2): via INTRAVENOUS

## 2018-01-04 MED ORDER — JEVITY 1.2 CAL PO LIQD: 1000.0000 mL | ORAL | Status: DC

## 2018-01-04 MED ORDER — JEVITY 1.2 CAL PO LIQD
1000.0000 mL | ORAL | Status: DC
  Administered 2018-01-04: 20 mL/h
  Administered 2018-01-05: 12:00:00
  Administered 2018-01-06: 1000 mL
  Administered 2018-01-06: 75 mL
  Administered 2018-01-07: 1000 mL
  Filled 2018-01-04 (×9): qty 1000

## 2018-01-04 NOTE — Progress Notes (Signed)
Pt taken off unit for feeding tube placement. Carmelina Dane, RN

## 2018-01-04 NOTE — Progress Notes (Addendum)
Nutrition Follow-up  DOCUMENTATION CODES:  Not applicable  INTERVENTION:  Consult for trial of enteral/tubfeeding support Consider placement of #12 nasogastric tube for CNG enteral support - decision to place tube transpyloric  Start Jevity 1.2 at 20 ml/hr, advance by 10 ml q 6 hours to a goal rate of 75 ml/hr ( 2160 Kcal, 100 g protein, 1460 ml free water ) Suggest that IVF rate can be decreased with tol of enteral Monitor electrolytes, including phos, K levels Continue C/L diet if pt wishes  NUTRITION DIAGNOSIS:  Inadequate oral intake related to nausea, vomiting as evidenced by percent weight loss. GOAL:  Patient will meet greater than or equal to 90% of their needs, Weight gain MONITOR:  Weight trends  REASON FOR ASSESSMENT:  New TF   ASSESSMENT:  24 2/7 weeks, GERD, hyperemesis. 10 MAU/ED/hosp admissions. pre-preg weight 169 lbs. to date a 30 lb weight loss. undergoing steroid taper  Diet Order:   Diet Order            Diet clear liquid Room service appropriate? Yes; Fluid consistency: Thin  Diet effective now             EDUCATION NEEDS:   No education needs have been identified at this time  Skin:  Skin Assessment: Reviewed RN Assessment  Height:   Ht Readings from Last 1 Encounters:  12/31/17 5\' 8"  (1.727 m)   Weight:   Wt Readings from Last 1 Encounters:  01/04/18 62.3 kg    Ideal Body Weight:    140 lbs  BMI:  Body mass index is 20.87 kg/m.  Estimated Nutritional Needs:   Kcal:  1900-2100  Protein:  85-95 g  Fluid:  2.2L    Deborah Evans M.Odis Luster LDN Neonatal Nutrition Support Specialist/RD III Pager [phone]      Phone [phone]

## 2018-01-04 NOTE — Progress Notes (Addendum)
[place] NOTE  Deborah Evans is a 22 y.o. G2P0010 at 77w3dwho is admitted for hyperemesis.  Estimated Date of Delivery: 04/23/18 Fetal presentation is unsure.  Length of Stay:  4 Days. Admitted 12/31/2017  Subjective: Patient reports normal fetal movement.  She denies uterine contractions, denies bleeding and leaking of fluid per vagina. She is still reporting significant nausea and vomiting, reports she is not able to keep any fluid down and that she is throwing up everything she attempts to drink. Epigastic pain is improved.   Vitals:  Blood pressure 117/81, pulse 99, temperature 98.9 F (37.2 C), temperature source Oral, resp. rate 17, height '5\' 8"'$  (1.727 m), weight 62.3 kg, last menstrual period 08/23/2017, SpO2 99 %, unknown if currently breastfeeding. Physical Examination: CONSTITUTIONAL: Well-developed, well-nourished female in no acute distress.  HENT:  Normocephalic, atraumatic, External right and left ear normal. Oropharynx is clear and moist EYES: Conjunctivae and EOM are normal. Pupils are equal, round, and reactive to light. No scleral icterus.  NECK: Normal range of motion, supple, no masses. SKIN: Skin is warm and dry. No rash noted. Not diaphoretic. No erythema. No pallor. NNavesink Alert and oriented to person, place, and time. Normal reflexes, muscle tone coordination. No cranial nerve deficit noted. PSYCHIATRIC: Normal mood and affect. Normal behavior. Normal judgment and thought content. CARDIOVASCULAR: Normal heart rate noted, regular rhythm RESPIRATORY: Effort and breath sounds normal, no problems with respiration noted MUSCULOSKELETAL: Normal range of motion. No edema and no tenderness. ABDOMEN: Soft, nontender, nondistended, gravid. CERVIX: deferred  FHR: 147 bpm as of yesterday  Results for orders placed or performed during the hospital encounter of 12/31/17 (from the past 48 hour(s))  Glucose, capillary     Status: None   Collection Time: 01/03/18  5:33 AM  Result Value Ref Range   Glucose-Capillary 89 70 - 99 mg/dL  Comprehensive metabolic panel     Status: Abnormal   Collection Time: 01/03/18  6:08 AM  Result Value Ref Range   Sodium 132 (L) 135 - 145 mmol/L   Potassium 2.9 (L) 3.5 - 5.1 mmol/L   Chloride 105 98 - 111 mmol/L   CO2 17 (L) 22 - 32 mmol/L   Glucose, Bld 92 70 - 99 mg/dL   BUN <5 (L) 6 - 20 mg/dL   Creatinine, Ser 0.33 (L) 0.44 - 1.00 mg/dL   Calcium 8.1 (L) 8.9 - 10.3 mg/dL   Total Protein 5.6 (L) 6.5 - 8.1 g/dL   Albumin 2.9 (L) 3.5 - 5.0 g/dL   AST 32 15 - 41 U/L   ALT 24 0 - 44 U/L   Alkaline Phosphatase 51 38 - 126 U/L   Total Bilirubin 1.5 (H) 0.3 - 1.2 mg/dL   GFR calc non Af Amer >60 >60 mL/min   GFR calc Af Amer >60 >60 mL/min    Comment: (NOTE) The eGFR has been calculated using the CKD EPI equation. This calculation has not been validated in all clinical situations. eGFR's persistently <60 mL/min signify possible Chronic Kidney Disease.    Anion gap 10 5 - 15    Comment: [facility] [address] [place] [address] Magnesium     Status: Abnormal   Collection Time: 01/03/18  6:08 AM  Result Value Ref Range   Magnesium 1.6 (L) 1.7 - 2.4 mg/dL    Comment: [facility] [address], [place] Glucose, capillary     Status: None  Collection Time: 01/04/18  5:57 AM  Result Value Ref Range   Glucose-Capillary 83 70 - 99 mg/dL    No results found.  Current scheduled medications . docusate sodium  100 mg Oral Daily  . enoxaparin (LOVENOX) injection  40 mg Subcutaneous Q24H  . [START ON 01/05/2018] methylPREDNISolone  8 mg Oral Q breakfast   Followed by  . [START ON 01/12/2018] methylPREDNISolone  4 mg Oral Q breakfast  . methylPREDNISolone  8 mg Oral Q1400   Followed by  . [START ON 01/06/2018] methylPREDNISolone  4 mg Oral Q1400  . methylPREDNISolone  8 mg Oral QHS   Followed by  . [START ON  01/07/2018] methylPREDNISolone  4 mg Oral QHS  . pantoprazole  20 mg Oral BID  . prenatal multivitamin  1 tablet Oral Q1200  . sucralfate  1 g Oral TID WC & HS   I have reviewed the patient's current medications.  ASSESSMENT: Principal Problem:   Hyperemesis gravidarum with dehydration Active Problems:   Supervision of normal pregnancy   Hypomagnesemia   Hypokalemia   GERD (gastroesophageal reflux disease)   PLAN: Patient still with significant nausea/vomiting, has not been able to keep anything more than sips of water down since arrival. As she is now 5 days without improvement in PO tolerance despite pharmacologic intervention and steroid taper, reviewed option for NG tube for enteral feeds, to which she is agreeable.   NG tube ordered Dietician consult for tube feeds IV electrolyte repletion as ordered Cont steroid taper Cont anti-emetics    Continue routine antenatal care.   Feliz Beam, M.D. [facility] [place]  01/04/2018 11:32 AM

## 2018-01-05 DIAGNOSIS — O211 Hyperemesis gravidarum with metabolic disturbance: Principal | ICD-10-CM

## 2018-01-05 DIAGNOSIS — E876 Hypokalemia: Secondary | ICD-10-CM

## 2018-01-05 LAB — BASIC METABOLIC PANEL
Anion gap: 8 (ref 5–15)
BUN: 6 mg/dL (ref 6–20)
CHLORIDE: 108 mmol/L (ref 98–111)
CO2: 18 mmol/L — AB (ref 22–32)
Calcium: 7.6 mg/dL — ABNORMAL LOW (ref 8.9–10.3)
GLUCOSE: 109 mg/dL — AB (ref 70–99)
Potassium: 2.8 mmol/L — ABNORMAL LOW (ref 3.5–5.1)
SODIUM: 134 mmol/L — AB (ref 135–145)

## 2018-01-05 LAB — MAGNESIUM: MAGNESIUM: 1.8 mg/dL (ref 1.7–2.4)

## 2018-01-05 LAB — GLUCOSE, CAPILLARY: Glucose-Capillary: 102 mg/dL — ABNORMAL HIGH (ref 70–99)

## 2018-01-05 LAB — PHOSPHORUS: Phosphorus: 2.3 mg/dL — ABNORMAL LOW (ref 2.5–4.6)

## 2018-01-05 MED ORDER — POTASSIUM CHLORIDE 10 MEQ/100ML IV SOLN
10.0000 meq | INTRAVENOUS | Status: AC
  Administered 2018-01-05 (×3): 10 meq via INTRAVENOUS
  Filled 2018-01-05 (×3): qty 100

## 2018-01-05 MED ORDER — COMPLETENATE 29-1 MG PO CHEW
1.0000 | CHEWABLE_TABLET | Freq: Every day | ORAL | Status: DC
  Administered 2018-01-05: 1 via ORAL
  Filled 2018-01-05 (×2): qty 1

## 2018-01-05 MED ORDER — FAMOTIDINE 20 MG PO TABS: 20.0000 mg | ORAL_TABLET | Freq: Every day | ORAL | Status: DC

## 2018-01-05 NOTE — Progress Notes (Signed)
[place] NOTE  Deborah Evans is a 22 y.o. G2P0010 at 35w4dwho is admitted for hyperemesis.  Estimated Date of Delivery: 04/23/18 Fetal presentation is unsure.  Length of Stay:  5 Days. Admitted 12/31/2017  Subjective: Patient reports normal fetal movement.  She denies uterine contractions, denies bleeding and leaking of fluid per vagina. She reports throat pain this morning. She denies any episodes of nausea or emesis overnight  Vitals:  Blood pressure 113/72, pulse 91, temperature 98.4 F (36.9 C), temperature source Oral, resp. rate 16, height '5\' 8"'$  (1.727 m), weight 64 kg, last menstrual period 08/23/2017, SpO2 99 %, unknown if currently breastfeeding. Physical Examination: CONSTITUTIONAL: Well-developed, well-nourished female in no acute distress.  CARDIOVASCULAR: Normal heart rate noted, regular rhythm RESPIRATORY: Effort and breath sounds normal, no problems with respiration noted MUSCULOSKELETAL: Normal range of motion. No edema and no tenderness. ABDOMEN: Soft, nontender, gravid. CERVIX: deferred  FHR: 160 bpm as of yesterday  Results for orders placed or performed during the hospital encounter of 12/31/17 (from the past 48 hour(s))  Glucose, capillary     Status: None   Collection Time: 01/04/18  5:57 AM  Result Value Ref Range   Glucose-Capillary 83 70 - 99 mg/dL  Basic metabolic panel     Status: Abnormal   Collection Time: 01/05/18  5:36 AM  Result Value Ref Range   Sodium 134 (L) 135 - 145 mmol/L   Potassium 2.8 (L) 3.5 - 5.1 mmol/L   Chloride 108 98 - 111 mmol/L   CO2 18 (L) 22 - 32 mmol/L   Glucose, Bld 109 (H) 70 - 99 mg/dL   BUN 6 6 - 20 mg/dL   Creatinine, Ser <0.30 (L) 0.44 - 1.00 mg/dL   Calcium 7.6 (L) 8.9 - 10.3 mg/dL   GFR calc non Af Amer NOT CALCULATED >60 mL/min   GFR calc Af Amer NOT CALCULATED >60 mL/min    Comment: (NOTE) The eGFR has been calculated using the CKD EPI equation. This calculation has not been validated  in all clinical situations. eGFR's persistently <60 mL/min signify possible Chronic Kidney Disease.    Anion gap 8 5 - 15    Comment: [facility] [address] [place] [address] Magnesium     Status: None   Collection Time: 01/05/18  5:36 AM  Result Value Ref Range   Magnesium 1.8 1.7 - 2.4 mg/dL    Comment: [facility] [address] [place]  [id number] Phosphorus     Status: Abnormal   Collection Time: 01/05/18  5:36 AM  Result Value Ref Range   Phosphorus 2.3 (L) 2.5 - 4.6 mg/dL    Comment: [facility] [address] [id number] Glucose, capillary     Status: Abnormal   Collection Time: 01/05/18  6:46 AM  Result Value Ref Range   Glucose-Capillary 102 (H) 70 - 99 mg/dL    Dg Abd 1 View  Result Date: 01/04/2018 CLINICAL DATA:  10 fr Cortrak placed  By  EDE 54  Sec fluoro EXAM: ABDOMEN - 1 VIEW COMPARISON:  None. FINDINGS: Contrast has been injected through the fluoroscopically placed feeding tube. Contrast opacifies normal appearing loops of duodenum, consistent with tube location in the LOWER descending portion of the duodenal. No evidence for leak. IMPRESSION: Placement of feeding tube within the LOWER descending duodenal. Tube is ready for use. Electronically Signed   By: ENolon NationsM.D.  On: 01/04/2018 13:56    Current scheduled medications . docusate sodium  100 mg Oral Daily  . enoxaparin (LOVENOX) injection  40 mg Subcutaneous Q24H  . methylPREDNISolone  8 mg Oral Q breakfast   Followed by  . [START ON 01/12/2018] methylPREDNISolone  4 mg Oral Q breakfast  . methylPREDNISolone  8 mg Oral Q1400   Followed by  . [START ON 01/06/2018] methylPREDNISolone  4 mg Oral Q1400  . methylPREDNISolone  8 mg Oral QHS   Followed by  . [START ON 01/07/2018] methylPREDNISolone  4 mg Oral QHS  . pantoprazole  20 mg Oral BID  . prenatal multivitamin  1 tablet Oral Q1200  . sucralfate  1 g  Oral TID WC & HS   I have reviewed the patient's current medications.  ASSESSMENT: Principal Problem:   Hyperemesis gravidarum with dehydration Active Problems:   Supervision of normal pregnancy   Hypomagnesemia   Hypokalemia   GERD (gastroesophageal reflux disease)   PLAN: Patient with improving nausea and emesis. Continue NGT tube feed Continue steroid taper Continue potassium repletion Cont anti-emetics  Continue routine antenatal care.   01/05/2018 7:58 AM

## 2018-01-06 LAB — PHOSPHORUS: Phosphorus: 2.2 mg/dL — ABNORMAL LOW (ref 2.5–4.6)

## 2018-01-06 LAB — BASIC METABOLIC PANEL
Anion gap: 8 (ref 5–15)
BUN: 8 mg/dL (ref 6–20)
CHLORIDE: 105 mmol/L (ref 98–111)
CO2: 23 mmol/L (ref 22–32)
Calcium: 7.9 mg/dL — ABNORMAL LOW (ref 8.9–10.3)
Creatinine, Ser: <0.3 mg/dL — ABNORMAL LOW (ref 0.44–1.00)
Glucose, Bld: 98 mg/dL (ref 70–99)
Potassium: 3.1 mmol/L — ABNORMAL LOW (ref 3.5–5.1)
Sodium: 136 mmol/L (ref 135–145)

## 2018-01-06 LAB — MAGNESIUM: Magnesium: 1.8 mg/dL (ref 1.7–2.4)

## 2018-01-06 LAB — GLUCOSE, CAPILLARY: Glucose-Capillary: 96 mg/dL (ref 70–99)

## 2018-01-06 MED ORDER — K PHOS MONO-SOD PHOS DI & MONO 155-852-130 MG PO TABS
250.0000 mg | ORAL_TABLET | Freq: Two times a day (BID) | ORAL | Status: DC
  Administered 2018-01-06 – 2018-01-08 (×5): 250 mg via ORAL
  Filled 2018-01-06 (×5): qty 1

## 2018-01-06 MED ORDER — POTASSIUM CHLORIDE CRYS ER 20 MEQ PO TBCR
20.0000 meq | EXTENDED_RELEASE_TABLET | Freq: Two times a day (BID) | ORAL | Status: DC
  Administered 2018-01-06 – 2018-01-08 (×5): 20 meq via ORAL
  Filled 2018-01-06 (×5): qty 1

## 2018-01-06 MED ORDER — FAMOTIDINE IN NACL 20-0.9 MG/50ML-% IV SOLN: 20.0000 mg | Freq: Two times a day (BID) | INTRAVENOUS | Status: DC

## 2018-01-06 NOTE — Progress Notes (Signed)
Daily Antepartum Note  Admission Date: 12/31/2017 Current Date: 01/06/2018 9:44 AM  Deborah Evans is a 22 y.o. G2P0010 @ [redacted]w[redacted]d, HD#7/POD#2 feeding tube placement, admitted for recurrent hyperemesis.  Pregnancy complicated by: Patient Active Problem List   Diagnosis Date Noted  . GERD (gastroesophageal reflux disease) 01/02/2018  . Hyperemesis gravidarum with dehydration 12/31/2017  . Nausea and vomiting in pregnancy 12/20/2017  . Hyperemesis affecting pregnancy, antepartum 12/03/2017  . Hypomagnesemia 11/28/2017  . Hypokalemia 11/28/2017  . Hyperemesis 11/24/2017  . Supervision of normal pregnancy 11/08/2017    Overnight/24hr events:  none  Subjective:  Patient states she feels better and is wondering when she could go home. +FM Patient states she's had about 2-3 of the hospital water jugs  Objective:    Current Vital Signs 24h Vital Sign Ranges  T 98.4 F (36.9 C) Temp  Avg: 98.2 F (36.8 C)  Min: 97.8 F (36.6 C)  Max: 98.8 F (37.1 C)  BP (!) 101/54 BP  Min: 90/71  Max: 123/73  HR 80 Pulse  Avg: 89  Min: 80  Max: 92  RR 16 Resp  Avg: 16.3  Min: 15  Max: 18  SaO2 100 % Room Air SpO2  Avg: 99.3 %  Min: 98 %  Max: 100 %       24 Hour I/O Current Shift I/O  Time Ins Outs 09/07 0701 - 09/08 0700 In: 1858.3 [P.O.:240; I.V.:678.3] Out: 2600 [Urine:2600] No intake/output data recorded.   Patient Vitals for the past 24 hrs:  BP Temp Temp src Pulse Resp SpO2 Weight  01/06/18 0810 (!) 101/54 98.4 F (36.9 C) Oral 80 16 100 % -  01/06/18 0509 - - - - - - 63.6 kg  01/06/18 0508 90/71 97.8 F (36.6 C) Oral 92 17 99 % -  01/06/18 0005 118/71 97.9 F (36.6 C) Oral 89 16 98 % -  01/05/18 1945 123/73 97.9 F (36.6 C) Oral 91 15 100 % -  01/05/18 1615 107/73 98.8 F (37.1 C) Oral 91 16 99 % -  01/05/18 1145 119/86 98.3 F (36.8 C) Oral 91 18 100 % -   Weight today: 63.6kg Weight yesterday 63.9kg Weight on admission: 66.7kg Physical exam: General: Well nourished, well  developed female in no acute distress. Abdomen: gravid nttp HEENT: nasal FT in place Respiratory: no respiratory distress Extremities: no clubbing, cyanosis or edema Skin: Warm and dry.   Medications: Current Facility-Administered Medications  Medication Dose Route Frequency Provider Last Rate Last Dose  . 0.9 %  sodium chloride infusion   Intravenous Continuous Conan Bowens, MD 125 mL/hr at 01/06/18 0600    . acetaminophen (TYLENOL) tablet 650 mg  650 mg Oral Q4H PRN Tilda Burrow, MD      . calcium carbonate (TUMS - dosed in mg elemental calcium) chewable tablet 400 mg of elemental calcium  2 tablet Oral Q4H PRN Tilda Burrow, MD   400 mg of elemental calcium at 01/03/18 2148  . docusate sodium (COLACE) capsule 100 mg  100 mg Oral Daily Tilda Burrow, MD   100 mg at 01/05/18 0947  . enoxaparin (LOVENOX) injection 40 mg  40 mg Subcutaneous Q24H Tilda Burrow, MD   40 mg at 01/05/18 1821  . famotidine (PEPCID) tablet 20 mg  20 mg Oral Daily Hermina Staggers, MD      . feeding supplement (JEVITY 1.2 CAL) liquid 1,000 mL  1,000 mL Per Tube Continuous Conan Bowens, MD 75 mL/hr  at 01/06/18 0508 1,000 mL at 01/06/18 0508  . methylPREDNISolone (MEDROL) tablet 8 mg  8 mg Oral Q breakfast Tilda Burrow, MD   8 mg at 01/06/18 1610   Followed by  . [START ON 01/12/2018] methylPREDNISolone (MEDROL) tablet 4 mg  4 mg Oral Q breakfast Tilda Burrow, MD      . methylPREDNISolone (MEDROL) tablet 4 mg  4 mg Oral Q1400 Tilda Burrow, MD      . methylPREDNISolone (MEDROL) tablet 8 mg  8 mg Oral QHS Tilda Burrow, MD   8 mg at 01/05/18 2143   Followed by  . [START ON 01/07/2018] methylPREDNISolone (MEDROL) tablet 4 mg  4 mg Oral QHS Tilda Burrow, MD      . ondansetron (ZOFRAN-ODT) disintegrating tablet 4 mg  4 mg Oral Q6H PRN Tilda Burrow, MD   4 mg at 01/04/18 0620  . pantoprazole (PROTONIX) EC tablet 20 mg  20 mg Oral BID Conan Bowens, MD   20 mg at 01/05/18 2142  .  prenatal vitamin w/FE, FA (NATACHEW) chewable tablet 1 tablet  1 tablet Oral Q1200 Hermina Staggers, MD   1 tablet at 01/05/18 1821  . promethazine (PHENERGAN) tablet 25 mg  25 mg Oral Q4H PRN Tilda Burrow, MD       Or  . promethazine (PHENERGAN) suppository 25 mg  25 mg Rectal Q4H PRN Tilda Burrow, MD      . sucralfate (CARAFATE) 1 GM/10ML suspension 1 g  1 g Oral TID WC & HS Lazaro Arms, MD   1 g at 01/06/18 0814  . zolpidem (AMBIEN) tablet 5 mg  5 mg Oral QHS PRN Tilda Burrow, MD   5 mg at 01/03/18 2307    Labs:  Recent Labs  Lab 12/31/17 0502  WBC 7.5  HGB 11.6*  HCT 35.3*  PLT 263    Recent Labs  Lab 12/31/17 0502 01/03/18 0608 01/05/18 0536 01/06/18 0544  NA 135 132* 134* 136  K 3.3* 2.9* 2.8* 3.1*  CL 105 105 108 105  CO2 14* 17* 18* 23  BUN 8 <5* 6 8  CREATININE 0.42* 0.33* <0.30* <0.30*  CALCIUM 9.4 8.1* 7.6* 7.9*  PROT 7.6 5.6*  --   --   BILITOT 1.7* 1.5*  --   --   ALKPHOS 68 51  --   --   ALT 17 24  --   --   AST 27 32  --   --   GLUCOSE 109* 92 109* 98     Radiology: no new imaging  Assessment & Plan:  Patient improving *Pregnancy:routine. qday FHTs *GI: followed by nutrition. Pt currently at goal of 77mL/hr of tube feeds. Can d/w them tomorrow re: potential home health tube feeds and/or weaning off tube feeds *Preterm: no issues *PPx: lovenox, oob ad lib *FEN/GI: clears and tube feeds. Pt states she takes pills okay so will do PO supplementation *Dispo: potentially in 1-2 days.   Cornelia Copa MD Attending Center for [facility]

## 2018-01-07 LAB — BASIC METABOLIC PANEL
ANION GAP: 8 (ref 5–15)
BUN: 9 mg/dL (ref 6–20)
CALCIUM: 8.1 mg/dL — AB (ref 8.9–10.3)
CO2: 24 mmol/L (ref 22–32)
Chloride: 103 mmol/L (ref 98–111)
Creatinine, Ser: <0.3 mg/dL — ABNORMAL LOW (ref 0.44–1.00)
Glucose, Bld: 97 mg/dL (ref 70–99)
POTASSIUM: 3.3 mmol/L — AB (ref 3.5–5.1)
Sodium: 135 mmol/L (ref 135–145)

## 2018-01-07 LAB — GLUCOSE, CAPILLARY
GLUCOSE-CAPILLARY: 97 mg/dL (ref 70–99)
GLUCOSE-CAPILLARY: 98 mg/dL (ref 70–99)
Glucose-Capillary: 83 mg/dL (ref 70–99)

## 2018-01-07 LAB — PHOSPHORUS: PHOSPHORUS: 3.4 mg/dL (ref 2.5–4.6)

## 2018-01-07 LAB — MAGNESIUM: Magnesium: 1.9 mg/dL (ref 1.7–2.4)

## 2018-01-07 MED ORDER — ENSURE ENLIVE PO LIQD
237.0000 mL | Freq: Two times a day (BID) | ORAL | Status: DC
  Administered 2018-01-07 – 2018-01-08 (×2): 237 mL via ORAL
  Filled 2018-01-07 (×3): qty 237

## 2018-01-07 MED ORDER — JEVITY 1.2 CAL PO LIQD
1000.0000 mL | ORAL | Status: DC
  Administered 2018-01-07 – 2018-01-08 (×2): 1000 mL
  Filled 2018-01-07: qty 1000

## 2018-01-07 NOTE — Progress Notes (Signed)
Daily Antepartum Note  Admission Date: 12/31/2017 Current Date: 01/07/2018 11:57 AM  Deborah Evans is a 22 y.o. G2P0010 @ [redacted]w[redacted]d, HD#8/POD#3 feeding tube placement, admitted for recurrent hyperemesis.  Pregnancy complicated by: Patient Active Problem List   Diagnosis Date Noted  . GERD (gastroesophageal reflux disease) 01/02/2018  . Hyperemesis gravidarum with dehydration 12/31/2017  . Nausea and vomiting in pregnancy 12/20/2017  . Hyperemesis affecting pregnancy, antepartum 12/03/2017  . Hypokalemia 11/28/2017  . Hyperemesis 11/24/2017  . Supervision of normal pregnancy 11/08/2017    Overnight/24hr events:  none  Subjective:  Patient states she feels better and is wondering when she could go home. Wants to try regular diet today, tolerated clears yesterday. Still getting tube feeds.  Objective:    Current Vital Signs 24h Vital Sign Ranges  T 97.7 F (36.5 C) Temp  Avg: 98.4 F (36.9 C)  Min: 97.7 F (36.5 C)  Max: 99.3 F (37.4 C)  BP (!) 103/52 BP  Min: 103/52  Max: 116/74  HR 83 Pulse  Avg: 88.6  Min: 83  Max: 97  RR 16 Resp  Avg: 16.4  Min: 16  Max: 18  SaO2 100 % Room Air SpO2  Avg: 99.8 %  Min: 99 %  Max: 100 %       24 Hour I/O Current Shift I/O  Time Ins Outs 09/08 0701 - 09/09 0700 In: 2770 [P.O.:780] Out: 2475 [Urine:2475] 09/09 0701 - 09/09 1900 In: 355 [P.O.:100] Out: -    Patient Vitals for the past 24 hrs:  BP Temp Temp src Pulse Resp SpO2 Weight  01/07/18 1141 (!) 103/52 97.7 F (36.5 C) Oral 83 16 100 % -  01/07/18 0805 110/62 98.5 F (36.9 C) Oral 89 16 100 % -  01/07/18 0500 - - - - - - 64.6 kg  01/07/18 0403 112/71 98 F (36.7 C) Oral 97 18 99 % -  01/06/18 1915 114/68 98.4 F (36.9 C) Oral 87 16 100 % -  01/06/18 1619 116/74 99.3 F (37.4 C) Oral 87 16 100 % -   Filed Weights   01/05/18 0545 01/06/18 0509 01/07/18 0500  Weight: 64 kg 63.6 kg 64.6 kg    Physical exam: General: Well nourished, well developed female in no acute  distress. Abdomen: gravid nttp HEENT: nasal FT in place Respiratory: no respiratory distress Extremities: no clubbing, cyanosis or edema Skin: Warm and dry.   Medications: Current Facility-Administered Medications  Medication Dose Route Frequency Provider Last Rate Last Dose  . 0.9 %  sodium chloride infusion   Intravenous Continuous Conan Bowens, MD 125 mL/hr at 01/06/18 0600    . acetaminophen (TYLENOL) tablet 650 mg  650 mg Oral Q4H PRN Tilda Burrow, MD      . enoxaparin (LOVENOX) injection 40 mg  40 mg Subcutaneous Q24H Tilda Burrow, MD   40 mg at 01/06/18 1745  . feeding supplement (JEVITY 1.2 CAL) liquid 1,000 mL  1,000 mL Per Tube Continuous Conan Bowens, MD 75 mL/hr at 01/07/18 0805 1,000 mL at 01/07/18 0805  . methylPREDNISolone (MEDROL) tablet 8 mg  8 mg Oral Q breakfast Tilda Burrow, MD   8 mg at 01/07/18 0830   Followed by  . [START ON 01/12/2018] methylPREDNISolone (MEDROL) tablet 4 mg  4 mg Oral Q breakfast Tilda Burrow, MD      . methylPREDNISolone (MEDROL) tablet 4 mg  4 mg Oral Q1400 Tilda Burrow, MD   4 mg at 01/06/18  1418  . methylPREDNISolone (MEDROL) tablet 4 mg  4 mg Oral QHS Tilda Burrow, MD      . ondansetron (ZOFRAN-ODT) disintegrating tablet 4 mg  4 mg Oral Q6H PRN Tilda Burrow, MD   4 mg at 01/04/18 0620  . pantoprazole (PROTONIX) EC tablet 20 mg  20 mg Oral BID Conan Bowens, MD   20 mg at 01/07/18 1008  . phosphorus (K PHOS NEUTRAL) tablet 250 mg  250 mg Oral BID [place] Bing, MD   250 mg at 01/07/18 1008  . potassium chloride SA (K-DUR,KLOR-CON) CR tablet 20 mEq  20 mEq Oral BID [place] Bing, MD   20 mEq at 01/07/18 1008  . promethazine (PHENERGAN) tablet 25 mg  25 mg Oral Q4H PRN Tilda Burrow, MD       Or  . promethazine (PHENERGAN) suppository 25 mg  25 mg Rectal Q4H PRN Tilda Burrow, MD      . sucralfate (CARAFATE) 1 GM/10ML suspension 1 g  1 g Oral TID WC & HS Lazaro Arms, MD   1 g at 01/07/18 0829     Labs:  No results for input(s): WBC, HGB, HCT, PLT in the last 168 hours.  Recent Labs  Lab 01/03/18 0608 01/05/18 0536 01/06/18 0544 01/07/18 0549  NA 132* 134* 136 135  K 2.9* 2.8* 3.1* 3.3*  CL 105 108 105 103  CO2 17* 18* 23 24  BUN <5* 6 8 9   CREATININE 0.33* <0.30* <0.30* <0.30*  CALCIUM 8.1* 7.6* 7.9* 8.1*  PROT 5.6*  --   --   --   BILITOT 1.5*  --   --   --   ALKPHOS 51  --   --   --   ALT 24  --   --   --   AST 32  --   --   --   GLUCOSE 92 109* 98 97     Radiology: no new imaging  Assessment & Plan:  Patient improving *Pregnancy:routine. qday FHTs *GI/FEN: followed by nutrition. Pt currently at goal of 53mL/hr of tube feeds.Will try regular diet today. if tolerated, will wean tube feeds.   *Preterm: no issues *PPx: lovenox, oob ad lib *Dispo: potentially tomorrow   Jaynie Collins, MD, FACOG Obstetrician & Gynecologist, Faculty Practice Center for [employer], [facility]

## 2018-01-07 NOTE — Progress Notes (Signed)
Nutrition follow-up Pt has  tol full vol TF's at 75 ml/hr without emesis. Reports she feels well. Weight with positive trend.  Tolerated water without emesis yesterday and is ready to try other beverages and solid food today.  Suggest cutting TF rate in half, 38 ml/hr. Order regular diet and Ensure Enlive BID ( chocolate )  and evalutate tolerance.  If tolerates regular diet without emesis for 24 hours, d/c TF and send home. If emesis returns, increased TF back to goal rate of 75 ml/hr. She could go home with TF's, to continue for at least 7 more days at home with another trial of solid food at home.  Elisabeth Cara M.Odis Luster LDN Neonatal Nutrition Support Specialist/RD III Pager [phone]      Phone [phone]

## 2018-01-07 NOTE — Care Management (Signed)
CM following patient for potential discharge needs.  CM on unit and spoke  to Dr. Macon Large and plan is to try patient on regular food to see if can tolerate it.  CM will continue to follow to see if patient will need Home Health. Call for dc needs.

## 2018-01-07 NOTE — Progress Notes (Addendum)
Lunch- Patient ate 100% of her blueberry muffin, 25% of her oatmeal, and grapes without n/v.   Dinner- Patient ate 1/2 dinner roll and pasta for dinner. No n/v

## 2018-01-08 LAB — BASIC METABOLIC PANEL
Anion gap: 9 (ref 5–15)
BUN: 8 mg/dL (ref 6–20)
CALCIUM: 8.3 mg/dL — AB (ref 8.9–10.3)
CO2: 24 mmol/L (ref 22–32)
Chloride: 102 mmol/L (ref 98–111)
Creatinine, Ser: <0.3 mg/dL — ABNORMAL LOW (ref 0.44–1.00)
Glucose, Bld: 94 mg/dL (ref 70–99)
Potassium: 3.2 mmol/L — ABNORMAL LOW (ref 3.5–5.1)
SODIUM: 135 mmol/L (ref 135–145)

## 2018-01-08 LAB — GLUCOSE, CAPILLARY: Glucose-Capillary: 93 mg/dL (ref 70–99)

## 2018-01-08 LAB — MAGNESIUM: MAGNESIUM: 1.9 mg/dL (ref 1.7–2.4)

## 2018-01-08 LAB — PHOSPHORUS: PHOSPHORUS: 3.7 mg/dL (ref 2.5–4.6)

## 2018-01-08 MED ORDER — PANTOPRAZOLE SODIUM 20 MG PO TBEC: 20.0000 mg | DELAYED_RELEASE_TABLET | Freq: Two times a day (BID) | ORAL | 2 refills | Status: DC

## 2018-01-08 MED ORDER — PROMETHAZINE HCL 25 MG PO TABS: 25.0000 mg | ORAL_TABLET | Freq: Four times a day (QID) | ORAL | 2 refills | Status: DC | PRN

## 2018-01-08 MED ORDER — PROMETHAZINE HCL 25 MG RE SUPP: 25.0000 mg | RECTAL | 2 refills | Status: DC | PRN

## 2018-01-08 MED ORDER — ONDANSETRON 8 MG PO TBDP: 8.0000 mg | ORAL_TABLET | Freq: Three times a day (TID) | ORAL | 3 refills | Status: DC | PRN

## 2018-01-08 NOTE — Progress Notes (Signed)
Patient discharged home with family. Follow-up care reviewed; Medications discussed; Discharge instructions reviewed; Admission discussed; Prescriptions reviewed. Pt discharged home with family.

## 2018-01-08 NOTE — Discharge Summary (Signed)
Antenatal Physician Discharge Summary  Patient ID: ARDITH TEST MRN: [id number] DOB/AGE: Sep 06, 1995 22 y.o.  Admit date: 12/31/2017 Discharge date: 01/08/2018  Admission Diagnoses: Principal Problem:   Hyperemesis gravidarum with dehydration Active Problems:   Supervision of normal pregnancy   Hypokalemia   GERD (gastroesophageal reflux disease)  Discharge Diagnoses: The same  Prenatal Procedures: Feeding tube placement   Hospital Course:  This is a 22 y.o. G2P0010 with IUP at [redacted]w[redacted]d who was admitted for HEG.  She was treated with several antiemetics over several days, including a Medrol taper with no amelioration of symptoms.  Finally on 01/04/18, a feeding tube was placed and Jevity tube feeds were initiated. She improved and was able to tolerate liquids -> regular food around the tube. With Nutritionist recommendation, tube was removed on 01/07/2018.  Her fetal heart rate monitoring remained reassuring, and she had no signs/symptoms of preterm labor or other maternal-fetal concerns.  She was deemed stable for discharge to home with outpatient follow up.  Discharge Exam: Temp:  [97.7 F (36.5 C)-98.6 F (37 C)] 98.4 F (36.9 C) (09/10 0815) Pulse Rate:  [80-90] 90 (09/10 0815) Resp:  [16-18] 18 (09/10 0815) BP: (96-115)/(52-68) 96/59 (09/10 0815) SpO2:  [100 %] 100 % (09/10 0815) Weight:  [64.4 kg] 64.4 kg (09/10 0617) Physical Examination: CONSTITUTIONAL: Well-developed, well-nourished female in no acute distress.  HENT:  Normocephalic, atraumatic, External right and left ear normal. Oropharynx is clear and moist EYES: Conjunctivae and EOM are normal. Pupils are equal, round, and reactive to light. No scleral icterus.  NECK: Normal range of motion, supple, no masses SKIN: Skin is warm and dry. No rash noted. Not diaphoretic. No erythema. No pallor. NEUROLGIC: Alert and oriented to person, place, and time. Normal reflexes, muscle tone coordination. No cranial nerve deficit  noted. PSYCHIATRIC: Normal mood and affect. Normal behavior. Normal judgment and thought content. CARDIOVASCULAR: Normal heart rate noted, regular rhythm RESPIRATORY: Effort and breath sounds normal, no problems with respiration noted MUSCULOSKELETAL: Normal range of motion. No edema and no tenderness. 2+ distal pulses. ABDOMEN: Soft, nontender, nondistended, gravid. CERVIX:  Deferred  Fetal monitoring: FHR: 150 bpm   Significant Diagnostic Studies:  Results for orders placed or performed during the hospital encounter of 12/31/17 (from the past 168 hour(s))  Occult blood gastric / duodenum   Collection Time: 01/01/18  2:08 PM  Result Value Ref Range   pH, Gastric NOT DONE    Occult Blood, Gastric POSITIVE (A) NEGATIVE  Glucose, capillary   Collection Time: 01/02/18  6:36 AM  Result Value Ref Range   Glucose-Capillary 89 70 - 99 mg/dL  Glucose, capillary   Collection Time: 01/03/18  5:33 AM  Result Value Ref Range   Glucose-Capillary 89 70 - 99 mg/dL  Comprehensive metabolic panel   Collection Time: 01/03/18  6:08 AM  Result Value Ref Range   Sodium 132 (L) 135 - 145 mmol/L   Potassium 2.9 (L) 3.5 - 5.1 mmol/L   Chloride 105 98 - 111 mmol/L   CO2 17 (L) 22 - 32 mmol/L   Glucose, Bld 92 70 - 99 mg/dL   BUN <5 (L) 6 - 20 mg/dL   Creatinine, Ser 5.78 (L) 0.44 - 1.00 mg/dL   Calcium 8.1 (L) 8.9 - 10.3 mg/dL   Total Protein 5.6 (L) 6.5 - 8.1 g/dL   Albumin 2.9 (L) 3.5 - 5.0 g/dL   AST 32 15 - 41 U/L   ALT 24 0 - 44 U/L   Alkaline Phosphatase  51 38 - 126 U/L   Total Bilirubin 1.5 (H) 0.3 - 1.2 mg/dL   GFR calc non Af Amer >60 >60 mL/min   GFR calc Af Amer >60 >60 mL/min   Anion gap 10 5 - 15  Magnesium   Collection Time: 01/03/18  6:08 AM  Result Value Ref Range   Magnesium 1.6 (L) 1.7 - 2.4 mg/dL  Glucose, capillary   Collection Time: 01/04/18  5:57 AM  Result Value Ref Range   Glucose-Capillary 83 70 - 99 mg/dL  Basic metabolic panel   Collection Time: 01/05/18  5:36  AM  Result Value Ref Range   Sodium 134 (L) 135 - 145 mmol/L   Potassium 2.8 (L) 3.5 - 5.1 mmol/L   Chloride 108 98 - 111 mmol/L   CO2 18 (L) 22 - 32 mmol/L   Glucose, Bld 109 (H) 70 - 99 mg/dL   BUN 6 6 - 20 mg/dL   Creatinine, Ser <1.61 (L) 0.44 - 1.00 mg/dL   Calcium 7.6 (L) 8.9 - 10.3 mg/dL   GFR calc non Af Amer NOT CALCULATED >60 mL/min   GFR calc Af Amer NOT CALCULATED >60 mL/min   Anion gap 8 5 - 15  Magnesium   Collection Time: 01/05/18  5:36 AM  Result Value Ref Range   Magnesium 1.8 1.7 - 2.4 mg/dL  Phosphorus   Collection Time: 01/05/18  5:36 AM  Result Value Ref Range   Phosphorus 2.3 (L) 2.5 - 4.6 mg/dL  Glucose, capillary   Collection Time: 01/05/18  6:46 AM  Result Value Ref Range   Glucose-Capillary 102 (H) 70 - 99 mg/dL  Basic metabolic panel   Collection Time: 01/06/18  5:44 AM  Result Value Ref Range   Sodium 136 135 - 145 mmol/L   Potassium 3.1 (L) 3.5 - 5.1 mmol/L   Chloride 105 98 - 111 mmol/L   CO2 23 22 - 32 mmol/L   Glucose, Bld 98 70 - 99 mg/dL   BUN 8 6 - 20 mg/dL   Creatinine, Ser <0.96 (L) 0.44 - 1.00 mg/dL   Calcium 7.9 (L) 8.9 - 10.3 mg/dL   GFR calc non Af Amer NOT CALCULATED >60 mL/min   GFR calc Af Amer NOT CALCULATED >60 mL/min   Anion gap 8 5 - 15  Magnesium   Collection Time: 01/06/18  5:44 AM  Result Value Ref Range   Magnesium 1.8 1.7 - 2.4 mg/dL  Phosphorus   Collection Time: 01/06/18  5:44 AM  Result Value Ref Range   Phosphorus 2.2 (L) 2.5 - 4.6 mg/dL  Glucose, capillary   Collection Time: 01/06/18  6:30 AM  Result Value Ref Range   Glucose-Capillary 96 70 - 99 mg/dL  Basic metabolic panel   Collection Time: 01/07/18  5:49 AM  Result Value Ref Range   Sodium 135 135 - 145 mmol/L   Potassium 3.3 (L) 3.5 - 5.1 mmol/L   Chloride 103 98 - 111 mmol/L   CO2 24 22 - 32 mmol/L   Glucose, Bld 97 70 - 99 mg/dL   BUN 9 6 - 20 mg/dL   Creatinine, Ser <0.45 (L) 0.44 - 1.00 mg/dL   Calcium 8.1 (L) 8.9 - 10.3 mg/dL   GFR calc  non Af Amer NOT CALCULATED >60 mL/min   GFR calc Af Amer NOT CALCULATED >60 mL/min   Anion gap 8 5 - 15  Magnesium   Collection Time: 01/07/18  5:49 AM  Result Value Ref Range  Magnesium 1.9 1.7 - 2.4 mg/dL  Phosphorus   Collection Time: 01/07/18  5:49 AM  Result Value Ref Range   Phosphorus 3.4 2.5 - 4.6 mg/dL  Glucose, capillary   Collection Time: 01/07/18  6:07 AM  Result Value Ref Range   Glucose-Capillary 97 70 - 99 mg/dL  Glucose, capillary   Collection Time: 01/07/18  5:00 PM  Result Value Ref Range   Glucose-Capillary 98 70 - 99 mg/dL  Glucose, capillary   Collection Time: 01/07/18 10:08 PM  Result Value Ref Range   Glucose-Capillary 83 70 - 99 mg/dL  Basic metabolic panel   Collection Time: 01/08/18  5:49 AM  Result Value Ref Range   Sodium 135 135 - 145 mmol/L   Potassium 3.2 (L) 3.5 - 5.1 mmol/L   Chloride 102 98 - 111 mmol/L   CO2 24 22 - 32 mmol/L   Glucose, Bld 94 70 - 99 mg/dL   BUN 8 6 - 20 mg/dL   Creatinine, Ser <1.01 (L) 0.44 - 1.00 mg/dL   Calcium 8.3 (L) 8.9 - 10.3 mg/dL   GFR calc non Af Amer NOT CALCULATED >60 mL/min   GFR calc Af Amer NOT CALCULATED >60 mL/min   Anion gap 9 5 - 15  Magnesium   Collection Time: 01/08/18  5:49 AM  Result Value Ref Range   Magnesium 1.9 1.7 - 2.4 mg/dL  Phosphorus   Collection Time: 01/08/18  5:49 AM  Result Value Ref Range   Phosphorus 3.7 2.5 - 4.6 mg/dL  Glucose, capillary   Collection Time: 01/08/18  6:15 AM  Result Value Ref Range   Glucose-Capillary 93 70 - 99 mg/dL   Dg Abd 1 View  Result Date: 01/04/2018 CLINICAL DATA:  10 fr Cortrak placed  By  EDE 54  Sec fluoro EXAM: ABDOMEN - 1 VIEW COMPARISON:  None. FINDINGS: Contrast has been injected through the fluoroscopically placed feeding tube. Contrast opacifies normal appearing loops of duodenum, consistent with tube location in the LOWER descending portion of the duodenal. No evidence for leak. IMPRESSION: Placement of feeding tube within the LOWER  descending duodenal. Tube is ready for use. Electronically Signed   By: Norva Pavlov M.D.   On: 01/04/2018 13:56    Future Appointments  Date Time Provider Department Center  01/11/2018  8:45 AM Cheral Marker, CNM [facility]    Discharge Condition: Stable   Discharge disposition: 01-Home or Self Care   Discharge Instructions    Discharge activity:  No Restrictions   Complete by:  As directed    Discharge diet:  No restrictions   Complete by:  As directed    No sexual activity restrictions   Complete by:  As directed    Notify physician for a general feeling that "something is not right"   Complete by:  As directed    Notify physician for uterine contractions.  These may be painless and feel like the uterus is tightening or the baby is  "balling up"   Complete by:  As directed    Notify physician for vaginal bleeding   Complete by:  As directed    PRETERM LABOR:  Includes any of the follwing symptoms that occur between 20 - [redacted] weeks gestation.  If these symptoms are not stopped, preterm labor can result in preterm delivery, placing your baby at risk   Complete by:  As directed      Allergies as of 01/08/2018   No Known Allergies  Medication List    TAKE these medications   diphenhydrAMINE 25 mg capsule Commonly known as:  BENADRYL Take 1 capsule (25 mg total) by mouth every 8 (eight) hours as needed (nausea, vomiting).   famotidine 20 MG tablet Commonly known as:  PEPCID Take 1 tablet (20 mg total) by mouth daily.   metoCLOPramide 10 MG tablet Commonly known as:  REGLAN Take 1 tablet (10 mg total) by mouth 3 (three) times daily before meals.   ondansetron 8 MG disintegrating tablet Commonly known as:  ZOFRAN-ODT Take 1 tablet (8 mg total) by mouth every 8 (eight) hours as needed for nausea or vomiting.   pantoprazole 20 MG tablet Commonly known as:  PROTONIX Take 1 tablet (20 mg total) by mouth 2 (two) times daily.   PRENATAL GUMMIES/DHA & FA  PO Take 2 tablets by mouth daily.   promethazine 25 MG tablet Commonly known as:  PHENERGAN Take 1 tablet (25 mg total) by mouth every 6 (six) hours as needed. What changed:  Another medication with the same name was added. Make sure you understand how and when to take each.   promethazine 25 MG suppository Commonly known as:  PHENERGAN Place 1 suppository (25 mg total) rectally every 4 (four) hours as needed for nausea. What changed:  You were already taking a medication with the same name, and this prescription was added. Make sure you understand how and when to take each.   pyridOXINE 25 MG tablet Commonly known as:  VITAMIN B-6 Take 1 tablet (25 mg total) by mouth 2 (two) times daily.   scopolamine 1 MG/3DAYS Commonly known as:  TRANSDERM-SCOP Place 1 patch (1.5 mg total) onto the skin every 3 (three) days.        Signed: Jaynie Collins M.D. 01/08/2018, 9:36 AM

## 2018-01-09 ENCOUNTER — Other Ambulatory Visit: Payer: Self-pay

## 2018-01-09 ENCOUNTER — Encounter (HOSPITAL_COMMUNITY): Payer: Self-pay

## 2018-01-09 ENCOUNTER — Inpatient Hospital Stay (HOSPITAL_COMMUNITY)
Admission: AD | Admit: 2018-01-09 | Discharge: 2018-01-15 | DRG: 833 | Disposition: A | Discharge status: Home / Self Care | Payer: Medicaid Other | Attending: Obstetrics & Gynecology | Admitting: Obstetrics & Gynecology

## 2018-01-09 DIAGNOSIS — Z363 Encounter for antenatal screening for malformations: Secondary | ICD-10-CM | POA: Diagnosis not present

## 2018-01-09 DIAGNOSIS — O210 Mild hyperemesis gravidarum: Secondary | ICD-10-CM

## 2018-01-09 DIAGNOSIS — R1110 Vomiting, unspecified: Secondary | ICD-10-CM

## 2018-01-09 DIAGNOSIS — Z3A25 25 weeks gestation of pregnancy: Secondary | ICD-10-CM | POA: Diagnosis not present

## 2018-01-09 DIAGNOSIS — O211 Hyperemesis gravidarum with metabolic disturbance: Secondary | ICD-10-CM | POA: Diagnosis not present

## 2018-01-09 DIAGNOSIS — Z4682 Encounter for fitting and adjustment of non-vascular catheter: Secondary | ICD-10-CM | POA: Diagnosis not present

## 2018-01-09 DIAGNOSIS — Z4659 Encounter for fitting and adjustment of other gastrointestinal appliance and device: Secondary | ICD-10-CM

## 2018-01-09 DIAGNOSIS — E876 Hypokalemia: Secondary | ICD-10-CM | POA: Diagnosis present

## 2018-01-09 LAB — COMPREHENSIVE METABOLIC PANEL
ALBUMIN: 3.5 g/dL (ref 3.5–5.0)
ALT: 58 U/L — ABNORMAL HIGH (ref 0–44)
ANION GAP: 10 (ref 5–15)
AST: 52 U/L — ABNORMAL HIGH (ref 15–41)
Alkaline Phosphatase: 68 U/L (ref 38–126)
BUN: 13 mg/dL (ref 6–20)
CO2: 24 mmol/L (ref 22–32)
Calcium: 8.9 mg/dL (ref 8.9–10.3)
Chloride: 101 mmol/L (ref 98–111)
Creatinine, Ser: 0.57 mg/dL (ref 0.44–1.00)
GFR calc non Af Amer: >60 mL/min (ref >60)
GLUCOSE: 93 mg/dL (ref 70–99)
POTASSIUM: 3.4 mmol/L — AB (ref 3.5–5.1)
Sodium: 135 mmol/L (ref 135–145)
Total Bilirubin: 0.9 mg/dL (ref 0.3–1.2)
Total Protein: 6.5 g/dL (ref 6.5–8.1)

## 2018-01-09 LAB — TYPE AND SCREEN
ABO/RH(D): O POS
ANTIBODY SCREEN: NEGATIVE

## 2018-01-09 LAB — URINALYSIS, ROUTINE W REFLEX MICROSCOPIC
BILIRUBIN URINE: NEGATIVE
Glucose, UA: NEGATIVE mg/dL
Leukocytes, UA: NEGATIVE
NITRITE: NEGATIVE
PROTEIN: NEGATIVE mg/dL
SPECIFIC GRAVITY, URINE: 1.01 (ref 1.005–1.030)
pH: 8 (ref 5.0–8.0)

## 2018-01-09 LAB — URINALYSIS, MICROSCOPIC (REFLEX)
BACTERIA UA: NONE SEEN
RBC / HPF: NONE SEEN RBC/hpf (ref 0–5)
WBC UA: NONE SEEN WBC/hpf (ref 0–5)

## 2018-01-09 MED ORDER — PRENATAL MULTIVITAMIN CH
1.0000 | ORAL_TABLET | Freq: Every day | ORAL | Status: DC
  Administered 2018-01-12 – 2018-01-14 (×3): 1 via ORAL
  Filled 2018-01-09 (×6): qty 1

## 2018-01-09 MED ORDER — FAMOTIDINE IN NACL 20-0.9 MG/50ML-% IV SOLN
20.0000 mg | Freq: Once | INTRAVENOUS | Status: AC
  Administered 2018-01-09: 20 mg via INTRAVENOUS
  Filled 2018-01-09: qty 50

## 2018-01-09 MED ORDER — SCOPOLAMINE 1 MG/3DAYS TD PT72
1.0000 | MEDICATED_PATCH | TRANSDERMAL | Status: DC
  Administered 2018-01-09 – 2018-01-12 (×2): 1.5 mg via TRANSDERMAL
  Filled 2018-01-09 (×2): qty 1

## 2018-01-09 MED ORDER — ACETAMINOPHEN 325 MG PO TABS: 650.0000 mg | ORAL_TABLET | ORAL | Status: DC | PRN

## 2018-01-09 MED ORDER — PROMETHAZINE HCL 25 MG/ML IJ SOLN
50.0000 mg | Freq: Once | INTRAVENOUS | Status: AC
  Administered 2018-01-09: 50 mg via INTRAVENOUS
  Filled 2018-01-09: qty 2

## 2018-01-09 MED ORDER — PROMETHAZINE HCL 25 MG/ML IJ SOLN
25.0000 mg | Freq: Four times a day (QID) | INTRAMUSCULAR | Status: DC | PRN
  Administered 2018-01-09: 25 mg via INTRAVENOUS
  Filled 2018-01-09: qty 1

## 2018-01-09 MED ORDER — SODIUM CHLORIDE 0.9 % IV SOLN
8.0000 mg | Freq: Once | INTRAVENOUS | Status: AC
  Administered 2018-01-09: 8 mg via INTRAVENOUS
  Filled 2018-01-09: qty 4

## 2018-01-09 MED ORDER — METOCLOPRAMIDE HCL 5 MG/ML IJ SOLN
10.0000 mg | Freq: Three times a day (TID) | INTRAMUSCULAR | Status: DC
  Administered 2018-01-09 – 2018-01-15 (×17): 10 mg via INTRAVENOUS
  Filled 2018-01-09 (×17): qty 2

## 2018-01-09 MED ORDER — FAMOTIDINE IN NACL 20-0.9 MG/50ML-% IV SOLN
20.0000 mg | INTRAVENOUS | Status: DC
  Administered 2018-01-10 – 2018-01-14 (×5): 20 mg via INTRAVENOUS
  Filled 2018-01-09 (×5): qty 50

## 2018-01-09 MED ORDER — SODIUM CHLORIDE 0.9 % IV SOLN
8.0000 mg | Freq: Three times a day (TID) | INTRAVENOUS | Status: DC | PRN
  Administered 2018-01-09: 8 mg via INTRAVENOUS
  Filled 2018-01-09: qty 4

## 2018-01-09 MED ORDER — LACTATED RINGERS IV BOLUS: 1000.0000 mL | Freq: Once | INTRAVENOUS | Status: DC

## 2018-01-09 MED ORDER — ZOLPIDEM TARTRATE 5 MG PO TABS
5.0000 mg | ORAL_TABLET | Freq: Every evening | ORAL | Status: DC | PRN
  Administered 2018-01-09: 5 mg via ORAL
  Filled 2018-01-09: qty 1

## 2018-01-09 MED ORDER — CALCIUM CARBONATE ANTACID 500 MG PO CHEW: 2.0000 | CHEWABLE_TABLET | ORAL | Status: DC | PRN

## 2018-01-09 MED ORDER — LACTATED RINGERS IV BOLUS
1000.0000 mL | Freq: Once | INTRAVENOUS | Status: AC
  Administered 2018-01-09: 1000 mL via INTRAVENOUS

## 2018-01-09 MED ORDER — DOCUSATE SODIUM 100 MG PO CAPS
100.0000 mg | ORAL_CAPSULE | Freq: Every day | ORAL | Status: DC
  Administered 2018-01-10 – 2018-01-14 (×4): 100 mg via ORAL
  Filled 2018-01-09 (×6): qty 1

## 2018-01-09 NOTE — MAU Note (Signed)
Been throwing up food she eat.  Back is cramping like she is on her period, started today.

## 2018-01-09 NOTE — H&P (Signed)
Deborah Evans is a 22 y.o. female G2P0010 @25 .1 weeks presenting for persistent HEG. She was admitted on 12/31/17 and discharged yesterday after tube feeds x4 days. She was able to tolerate one meal at home before N/V resumed. She cannot tolerate anything po. She took Zofran and Phenergan this am but didn't help. She has not used the Scop patch because she thinks it makes it worse. She was given IV fluids, Zofran, Scop patch, and Phenergan in MAU and continued to have emesis. Feeding tube could not be placed until Friday.  OB History    Gravida  2   Para      Term      Preterm      AB  1   Living  0     SAB  1   TAB      Ectopic      Multiple      Live Births             Past Medical History:  Diagnosis Date  . Hyperemesis gravidarum    Past Surgical History:  Procedure Laterality Date  . DILATION AND CURETTAGE OF UTERUS N/A 07/15/2014   Procedure: SUCTION DILATATION AND CURETTAGE;  Surgeon: Lazaro Arms, MD;  Location: [facility];  Service: Gynecology;  Laterality: N/A;   Family History: family history includes Hypertension in her maternal grandmother. Social History:  reports that she has never smoked. She has never used smokeless tobacco. She reports that she does not drink alcohol or use drugs.  Review of Systems  Constitutional: Negative for fever.  Gastrointestinal: Positive for nausea and vomiting.  Musculoskeletal: Positive for back pain.   Maternal Medical History:  Reason for admission: Nausea.       Blood pressure 135/76, pulse (!) 104, temperature 98.3 F (36.8 C), temperature source Oral, resp. rate 18, weight 63.7 kg, last menstrual period 08/23/2017, SpO2 99 %, unknown if currently breastfeeding.   Exam Physical Exam  Nursing note and vitals reviewed. Constitutional: She is oriented to person, place, and time. She appears well-developed and well-nourished. No distress.  HENT:  Head: Normocephalic and atraumatic.  Neck: Normal range of motion.   Cardiovascular: Normal rate.  Respiratory: Effort normal. No respiratory distress.  GI: Soft. She exhibits no distension. There is no tenderness.  gravid  Musculoskeletal: Normal range of motion.  Neurological: She is alert and oriented to person, place, and time.  Skin: Skin is warm and dry.  Psychiatric: She has a normal mood and affect.  FHT 151  Prenatal labs: ABO, Rh: --/--/O POS, O POS Performed at [facility], [address], [place], [place] [address]  [phone]/02 1838) Antibody: NEG (09/02 1838) Rubella: 5.20 (07/11 1417) RPR: Non Reactive (07/11 1417)  HBsAg: Negative (07/11 1417)  HIV: Non Reactive (07/11 1417)  GBS:     Assessment/Plan: [redacted] weeks gestation HEG Hypokalemia Admit to Ante Antiemetics Replace feeding tube on Friday  Mngt per Dr. Debroah Baller 01/09/2018, 9:42 PM

## 2018-01-10 DIAGNOSIS — O211 Hyperemesis gravidarum with metabolic disturbance: Principal | ICD-10-CM

## 2018-01-10 MED ORDER — SODIUM CHLORIDE 0.9 % IV SOLN
50.0000 mg | INTRAVENOUS | Status: DC
  Administered 2018-01-10 – 2018-01-15 (×15): 50 mg via INTRAVENOUS
  Filled 2018-01-10 (×16): qty 2

## 2018-01-10 MED ORDER — JEVITY 1.2 CAL PO LIQD
1000.0000 mL | ORAL | Status: DC
  Administered 2018-01-14: 14:00:00
  Filled 2018-01-10 (×9): qty 1000

## 2018-01-10 MED ORDER — LACTATED RINGERS IV SOLN: INTRAVENOUS | Status: DC

## 2018-01-10 NOTE — Care Management Note (Signed)
Case Management Note  Patient Details  Name: Deborah Evans MRN: [id number] Date of Birth: May 05, 1995  Subjective/Objective:    22 year old female admitted yesterday with hyperemesis.               Action/Plan:D/C when medically stable.            Expected Discharge Plan:  [place]  In-House Referral:  Nutrition  Discharge planning Services  CM Consult  Post Acute Care Choice:  Durable Medical Equipment, Home Health Choice offered to:  Parent  DME Arranged:  Tube feeding, Tube feeding pump DME Agency:  [place]:  RN [facility] Agency:  [place]  Status of Service:  In process, will continue to follow  Additional Comments:  CM received consult and met with pt in pt's hospital room to offer choice for [facility] services.  Pt with no preference so Santiago Glad at [facility] contacted with orders.   Aida Raider RNC-MNN, BSN 01/10/2018, 1:27 PM

## 2018-01-10 NOTE — Progress Notes (Signed)
Faculty Practice OB/GYN Attending Note  Subjective:  Patient reported vomiting last night and this morning, not looking forward to getting tube feeds restarted. FHR reassuring, no contractions, no LOF or vaginal bleeding. Good FM.   Admitted on 01/09/2018 for Hyperemesis gravidarum with dehydration.    Objective:  Blood pressure 102/85, pulse 97, temperature 99 F (37.2 C), temperature source Oral, resp. rate 18, height 5\' 8"  (1.727 m), weight 63.7 kg, last menstrual period 08/23/2017, SpO2 99 %, unknown if currently breastfeeding. FHR  Baseline 145 bpm Gen: NAD HENT: Normocephalic, atraumatic Lungs: Normal respiratory effort Heart: Regular rate noted Abdomen: NT gravid fundus, soft Cervix: Deferred Ext: 2+ DTRs, no edema, no cyanosis, negative Homan's sign  Assessment & Plan:  22 y.o. G2P0010 at 5851w2d re-admitted for HEG Nutrition re-consulted to help in re-initiating tube feeds.  Tube placement by IR tomorrow. Continue antiemetics as needed.    Continue close observation.   Jaynie CollinsUGONNA  Katrenia Alkins, MD, FACOG Obstetrician & Gynecologist, [facility] for [facility], [facility]

## 2018-01-10 NOTE — Progress Notes (Signed)
Initial Nutrition Assessment  DOCUMENTATION CODES:   Not applicable  INTERVENTION:  Currently is ordered regular diet, to be NPO 9/13 for feeding tube placement Jevity 1.2 ordered, to be initiated after tube placement at 30 ml/hr, and advance by 10 ml q 6 hours to goal of 75 ml/hr Once full vol enteral is tol well may wish to introduce option of solid food to pt while continuing TF's at goal rate May wish to discharge this pt home with TF's at goal rate once stable Monitor electrolytes, phos, K, Mg  NUTRITION DIAGNOSIS:  Inadequate oral intake related to nausea, vomiting as evidenced by  . GOAL:  Patient will meet greater than or equal to 90% of their needs  MONITOR:  Labs, Weight trends  REASON FOR ASSESSMENT:  New TF   ASSESSMENT:   25 2/7 weeks readmitted after failure to tol regular diet at home for 24-36 hours. Tolerated initation and advancement to goal TF's earlier this week, and had experienced some weight gain.. Is currently 29 lbs below prepreg weight, which puts her at high risk for moderate degree of malnutrition given the 17% loss of usual weight and the extended time period of the hyperemesis  Jevity 1.2 at 75 ml/hr, provides 2160 Kcal/day, 100 g protein and 1460 ml free water. She will need water flushes to make up balance of free water needs if no IVF   Diet Order:   Diet Order            Diet NPO time specified Except for: Sips with Meds  Diet effective midnight        Diet regular Room service appropriate? Yes; Fluid consistency: Thin  Diet effective now              EDUCATION NEEDS:   No education needs have been identified at this time  Skin:  Skin Assessment: Reviewed RN Assessment  Last BM:   9/11  Height:   Ht Readings from Last 1 Encounters:  01/09/18 5\' 8"  (1.727 m)    Weight:   Wt Readings from Last 1 Encounters:  01/09/18 63.7 kg    Ideal Body Weight:     BMI:  Body mass index is 21.36 kg/m.  Estimated Nutritional Needs:    Kcal:  1900-2100  Protein:  85-95 g  Fluid:  2.2L    Elisabeth CaraKatherine Maysa Lynn M.Odis LusterEd. R.D. LDN Neonatal Nutrition Support Specialist/RD III Pager [phone]      Phone [phone]

## 2018-01-11 ENCOUNTER — Encounter: Payer: BLUE CROSS/BLUE SHIELD | Admitting: Women's Health

## 2018-01-11 NOTE — Progress Notes (Signed)
CM met with pt today to discuss [place] and DME.  DME will be delivered to pt's home.  Pt states that her Mother will be able to assist with feedings, they live together.  At this time, RN services unable to be arranged at home.  DME will be delivered to pt's home with instructions.  Pt will also receive teaching in the hospital as well as discharge teaching.  Pt is aware   Pt will follow up as needed with her MD and [facility].  Will continue to follow.  Still awaiting IR placement and then will need nutritional follow up.  Aida Raider RNC-MNN, BSN

## 2018-01-11 NOTE — Progress Notes (Signed)
Unable to locate person to place post-pyloric feeding tube this afternoon.  I have spoken to the radiology departments at [facility], [facility] ([place] and [facility] [facility]  I have also spoken to BoothKate, RD on the [facility] team who said they only place NG feeding tubes in patients [facility] and that radiology handles the placements for patients on other campuses.  Asher MuirJamie, AD for [facility] and this RN, who remembers from [facility] meetings, that bedside nurses do not place weighted post-pyloric feeding tubes.  I spoke to Dr. Macon LargeAnyanwu.  She called Dr. Eppie GibsonStahl radiologist at [facility] today who says that this is a bedside nursing procedure.  Dr. Macon LargeAnyanwu ordered a PO diet for the patient and will evaluate need for feeding tube placement again on Monday.

## 2018-01-11 NOTE — Progress Notes (Signed)
I received a referral from pt's nurse due to multiple admissions.  I spent time with pt who was feeling okay at the moment.  She is hopeful that the ng tube will help.  Pt reports good support from her mother and grandmother (with whom she lives) as well as FOB (though he has been working a lot). She is excited about the pregnancy but wishes that she felt better.  Pt answered my questions, but gave somewhat limited answers and overall had a flat affect.  We will continue to follow up with her if she has any additional admissions during her pregnancy.    [address]Chaplain KAty Benjamine SpragueClaussen, BCc PAgerm, [phone] 3:42 PM    01/11/18 1500  Clinical Encounter Type  Visited With Patient  Visit Type Spiritual support

## 2018-01-11 NOTE — Progress Notes (Signed)
Faculty Practice OB/GYN Attending Note  Subjective:  Patient getting feeding tube re-inserted today. No complaints.  FHR reassuring, no contractions, no LOF or vaginal bleeding. Good FM.   Admitted on 01/09/2018 for Hyperemesis gravidarum with dehydration.    Objective:  Blood pressure 102/76, pulse 87, temperature 98.5 F (36.9 C), temperature source Oral, resp. rate 18, height 5\' 8"  (1.727 m), weight 63.7 kg, last menstrual period 08/23/2017, SpO2 99 %, unknown if currently breastfeeding. FHR  Baseline 145 bpm Gen: NAD HENT: Normocephalic, atraumatic Lungs: Normal respiratory effort Heart: Regular rate noted Abdomen: NT gravid fundus, soft Cervix: Deferred Ext: 2+ DTRs, no edema, no cyanosis, negative Homan's sign  CMP Latest Ref Rng & Units 01/09/2018 01/08/2018 01/07/2018  Glucose 70 - 99 mg/dL 93 94 97  BUN 6 - 20 mg/dL 13 8 9   Creatinine 0.44 - 1.00 mg/dL 1.610.57 <0.96(E<0.30(L) <4.54(U<0.30(L)  Sodium 135 - 145 mmol/L 135 135 135  Potassium 3.5 - 5.1 mmol/L 3.4(L) 3.2(L) 3.3(L)  Chloride 98 - 111 mmol/L 101 102 103  CO2 22 - 32 mmol/L 24 24 24   Calcium 8.9 - 10.3 mg/dL 8.9 9.8(J8.3(L) 8.1(L)  Total Protein 6.5 - 8.1 g/dL 6.5 - -  Total Bilirubin 0.3 - 1.2 mg/dL 0.9 - -  Alkaline Phos 38 - 126 U/L 68 - -  AST 15 - 41 U/L 52(H) - -  ALT 0 - 44 U/L 58(H) - -    Assessment & Plan:  22 y.o. G2P0010 at 6442w3d re-admitted for HEG Tube placement by IR today.  Nutrition re-consulted to help in re-initiating tube feeds. Continue antiemetics as needed.    Continue close observation.   Deborah Evans  Deborah Peed, MD, FACOG Obstetrician & Gynecologist, [facility] for [facility], [facility]

## 2018-01-12 MED ORDER — ENSURE ENLIVE PO LIQD
237.0000 mL | Freq: Two times a day (BID) | ORAL | Status: DC
  Administered 2018-01-12 – 2018-01-14 (×5): 237 mL via ORAL
  Filled 2018-01-12 (×8): qty 237

## 2018-01-12 NOTE — Progress Notes (Signed)
Faculty Practice OB/GYN Attending Note  Subjective:  Patient was unable to get getting feeding tube re-inserted yesterday. No complaints.  FHR reassuring, no contractions, no LOF or vaginal bleeding. Good FM.   Admitted on 01/09/2018 for Hyperemesis gravidarum with dehydration.    Objective:  Blood pressure 107/66, pulse 80, temperature 98.3 F (36.8 C), temperature source Oral, resp. rate 16, height 5\' 8"  (1.727 m), weight 63.7 kg, last menstrual period 08/23/2017, SpO2 100 %, unknown if currently breastfeeding. FHR  Baseline 145 bpm Gen: NAD HENT: Normocephalic, atraumatic Lungs: Normal respiratory effort Heart: Regular rate noted Abdomen: NT gravid fundus, soft Cervix: Deferred Ext: 2+ DTRs, no edema, no cyanosis, negative Homan's sign  CMP Latest Ref Rng & Units 01/09/2018 01/08/2018 01/07/2018  Glucose 70 - 99 mg/dL 93 94 97  BUN 6 - 20 mg/dL 13 8 9   Creatinine 0.44 - 1.00 mg/dL 1.610.57 <0.96(E<0.30(L) <4.54(U<0.30(L)  Sodium 135 - 145 mmol/L 135 135 135  Potassium 3.5 - 5.1 mmol/L 3.4(L) 3.2(L) 3.3(L)  Chloride 98 - 111 mmol/L 101 102 103  CO2 22 - 32 mmol/L 24 24 24   Calcium 8.9 - 10.3 mg/dL 8.9 9.8(J8.3(L) 8.1(L)  Total Protein 6.5 - 8.1 g/dL 6.5 - -  Total Bilirubin 0.3 - 1.2 mg/dL 0.9 - -  Alkaline Phos 38 - 126 U/L 68 - -  AST 15 - 41 U/L 52(H) - -  ALT 0 - 44 U/L 58(H) - -    Assessment & Plan:  22 y.o. G2P0010 at 2648w3d re-admitted for HEG Tube placement by IR on Monday. Nutrition re-consulted to help in re-initiating tube feeds. Continue regular diet; unable to disharge to home given high risk of re-admission as patient doe snot seem to be able to eat at home, and returns with electrolytes imbalances and >80 ketonuria.  She needs tube feeds at home.  Continue antiemetics as needed.    Continue close observation.   Deborah Evans  Deborah Zeiders, Deborah Evans, Deborah Evans Obstetrician & Gynecologist, [facility], [facility]

## 2018-01-12 NOTE — Progress Notes (Signed)
Nutrition  Noted inability to place feeding tube and order of regular diet. Ordered Ensure which pt has tolerated in the past.  Deborah Evans M.Odis LusterEd. R.D. LDN Neonatal Nutrition Support Specialist/RD III Pager [phone]      Phone [phone]

## 2018-01-13 NOTE — Progress Notes (Signed)
FACULTY PRACTICE ANTEPARTUM(COMPREHENSIVE) NOTE  Deborah Evans is a 22 y.o. G2P0010 at 4389w5d  who is admitted for Hyperemesis, this time 24 hours after discharge from prior admission when NG tube was placed, functioning and removed per pt preference prior to d/c.   Fetal presentation is unsure. Length of Stay:  4  Days  Subjective: Pt tolerating regular diet today. Pt aware plan is for NG feeding tube in am then d/c home Patient reports the fetal movement as active. Patient reports uterine contraction  activity as none. Patient reports  vaginal bleeding as none. Patient describes fluid per vagina as None.  Vitals:  Blood pressure 113/76, pulse 87, temperature 98.2 F (36.8 C), temperature source Oral, resp. rate 16, height 5\' 8"  (1.727 m), weight 63.7 kg, last menstrual period 08/23/2017, SpO2 100 %, unknown if currently breastfeeding. Physical Examination:  General appearance - alert, well appearing, and in no distress, oriented to person, place, and time and normal appearing weight Heart - normal rate and regular rhythm Abdomen - soft, nontender, nondistended Fundal Height:  size equals dates Cervical Exam: Not evaluated. Extremities: extremities normal, atraumatic, no cyanosis or edema and Homans sign is negative, no sign of DVT with DTRs 2+ bilaterally Membranes:intact  Fetal Monitoring:  Normal FHR  Labs:  No results found for this or any previous visit (from the past 24 hour(s)).  Imaging Studies:    Last u/s 8/5 will check growth u/s prior to d/c tomorrow. Medications:  Scheduled . docusate sodium  100 mg Oral Daily  . feeding supplement (ENSURE ENLIVE)  237 mL Oral BID BM  . metoCLOPramide (REGLAN) injection  10 mg Intravenous Q8H  . prenatal multivitamin  1 tablet Oral Q1200  . scopolamine  1 patch Transdermal Q72H   I have reviewed the patient's current medications.  ASSESSMENT: Patient Active Problem List   Diagnosis Date Noted  . GERD (gastroesophageal reflux  disease) 01/02/2018  . Hyperemesis gravidarum with dehydration 12/31/2017  . Hypokalemia 11/28/2017  . Supervision of normal pregnancy 11/08/2017    PLAN: NG tube in am U/s growth in a.m D/c after NG placement  Deborah Evans 01/13/2018,9:05 AM    Patient ID: Deborah Evans, female   DOB: 10/19/1995, 22 y.o.   MRN: [id number]

## 2018-01-14 ENCOUNTER — Inpatient Hospital Stay (HOSPITAL_COMMUNITY): Payer: Medicaid Other

## 2018-01-14 ENCOUNTER — Inpatient Hospital Stay (HOSPITAL_BASED_OUTPATIENT_CLINIC_OR_DEPARTMENT_OTHER): Payer: Medicaid Other

## 2018-01-14 DIAGNOSIS — O211 Hyperemesis gravidarum with metabolic disturbance: Secondary | ICD-10-CM

## 2018-01-14 DIAGNOSIS — Z3A25 25 weeks gestation of pregnancy: Secondary | ICD-10-CM

## 2018-01-14 DIAGNOSIS — Z363 Encounter for antenatal screening for malformations: Secondary | ICD-10-CM

## 2018-01-14 MED ORDER — LIDOCAINE VISCOUS HCL 2 % MT SOLN
5.0000 mL | Freq: Once | OROMUCOSAL | Status: AC
  Administered 2018-01-14: 5 mL via OROMUCOSAL

## 2018-01-14 MED ORDER — IOPAMIDOL (ISOVUE-300) INJECTION 61%
25.0000 mL | Freq: Once | INTRAVENOUS | Status: AC | PRN
  Administered 2018-01-14: 25 mL via ORAL

## 2018-01-14 NOTE — Progress Notes (Signed)
Patient ID: Deborah Quamyana S Baptista, female   DOB: [birth year], 22 y.o.   MRN: [id number] FACULTY PRACTICE ANTEPARTUM(COMPREHENSIVE) NOTE  Deborah Evans is a 22 y.o. G2P0010 at 7781w6d by best clinical estimate who is admitted for hyperemesis gravidarum.   Fetal presentation is unsure. Length of Stay:  5  Days  Subjective: Feels better Patient reports the fetal movement as active. Patient reports uterine contraction  activity as none. Patient reports  vaginal bleeding as none. Patient describes fluid per vagina as None.  Vitals:  Blood pressure 124/80, pulse 85, temperature 98.9 F (37.2 C), temperature source Oral, resp. rate 18, height 5\' 8"  (1.727 m), weight 63.7 kg, last menstrual period 08/23/2017, SpO2 99 %, unknown if currently breastfeeding. Physical Examination:  General appearance - alert, well appearing, and in no distress Heart - normal rate and regular rhythm Abdomen - soft, nontender, nondistended Fundal Height:  size equals dates Cervical Exam: Not evaluated. Extremities: extremities normal, atraumatic, no cyanosis or edema and Homans sign is negative, no sign of DVT with DTRs 2+ bilaterally Membranes:intact  Fetal Monitoring:     Fetal Heart Rate A  Mode External filed at 01/13/2018 2257  Baseline Rate (A) 150 bpm filed at 01/13/2018 2257  Variability 6-25 BPM filed at 01/13/2018 2257  Accelerations 15 x 15 filed at 01/13/2018 2257  Decelerations None filed at 01/13/2018 2257     Labs:  No results found for this or any previous visit (from the past 24 hour(s)).    Medications:  Scheduled . docusate sodium  100 mg Oral Daily  . feeding supplement (ENSURE ENLIVE)  237 mL Oral BID BM  . metoCLOPramide (REGLAN) injection  10 mg Intravenous Q8H  . prenatal multivitamin  1 tablet Oral Q1200  . scopolamine  1 patch Transdermal Q72H   I have reviewed the patient's current medications.  ASSESSMENT: Patient Active Problem List   Diagnosis Date Noted  . GERD  (gastroesophageal reflux disease) 01/02/2018  . Hyperemesis gravidarum with dehydration 12/31/2017  . Hypokalemia 11/28/2017  . Supervision of normal pregnancy 11/08/2017    PLAN: Feeding tube placement today  Scheryl DarterJames Arnold 01/14/2018,10:38 AM

## 2018-01-14 NOTE — Progress Notes (Signed)
Called into patient's room because she had thrown up and end of feeding tube was hanging from patient's mouth. Dr. Vergie LivingPickens informed. Bridle was cut with sterile scissors and removed prior to pulling feeding tube from patient's nose.

## 2018-01-14 NOTE — Progress Notes (Signed)
This is a follow up note regarding HH RN services for pt.  CM [facility] on Friday[facility]  [facility]  [facility] can provide [facility] services, but must have [facility] to work with.  CM contacted [facility] as they are providing DME services.  [facility] is not able to provide pharmacy services, therefore, at this time, RN services are not available for pt.  CM discussed this with pt.  Pt is agreeable and amenable, with teaching, for management of tube feedings at home.  [facility] unable to provide RN services either. CM will continue to follow pt.  Legend Tumminello RNC-MNN, BSV

## 2018-01-14 NOTE — Progress Notes (Signed)
Spoke with Neurosurgeonadministrative coordinator, [place], regarding feeding tube placement. As instructed per [facility], I paged radiology for further direction to make arrangements for feeding tube placement.

## 2018-01-14 NOTE — Progress Notes (Signed)
[facility] She stated that because of staffing issues, Irving Burtonmily would not be able to place feeding tube. She stated that the usual procedure was that after an unsuccessful attempt by the nurse, tube could be placed by radiologist if arrangements were made for transportation.

## 2018-01-14 NOTE — Progress Notes (Signed)
EFM strip reviewed by Dr. Debroah LoopArnold and he is comfortable with strip for gestational age.

## 2018-01-14 NOTE — Care Management (Signed)
1600 - CM met with met in room earlier today and spoke to Dr. Roselie Awkward.  Patient had tube placed today.  Patient will not be discharged today.  Patient will have feeding pump, supplies and Jevity delivered to patient's room at hospital tomorrow per Donnel Saxon with [facility] # [phone].  [facility] will be providing the DME for the patient, but they  cannot provide any HH for the patient.  RN services are not available for pt.  CM discussed this with pt.  Pt is agreeable and amenable, with teaching, for management of tube feedings at home.   Instructed  that if she has any problems with feeding tube at home to come into the Maternity Admissions.  Dr. Roselie Awkward aware of plan.

## 2018-01-14 NOTE — Progress Notes (Signed)
Spoke with Herbert SetaHeather at [facility], as per Corporate treasurerinstructions of assistant nursing director. Lamount CrankerJamie Cargal. Heather stated [facility] does not place feeding tubes in patients not [facility]. Heather suggested I call radiology at [facility]. I called [facility] at [facility] and spoke with Vickie EpleyEmily Evans, radiology technician. Ms.Evans stated she would come [facility] today to place post-pyloric feeding tube when she was able to work procedure into her schedule.

## 2018-01-14 NOTE — Progress Notes (Signed)
[facility] Tube Team Note:  Consult received to place a bridle after a small bore feeding tube was [facility].   A 10 F [facility] tube was secured with a nasal bridle at 57 cm in the R nare. Confirmed with IR tech that 57 cm was the appropriate cm marking when securing the bridle. Per pt the bridle was not tugging and was comfortable.   Explained bridle removal to pt and [facility].  If tube must be adjusted the bridle can be unclipped and left in place while tube is repositioned. Then re-clipped once tube in position.  Once feeding tube ready to be removed recommend cutting only one side of the bridle tubing and pulling the bridle and feeding tube out at the same time.   Please call [facility] team for questions as needed.  Contact info listed on [email or web address] Password: [email or web address]  Kendell BaneHeather Carlota Philley RD, LDN, CNSC [phone] Pager [phone] After Hours Pager

## 2018-01-14 NOTE — Progress Notes (Signed)
Norm in radiology at [facility] called to inform me that Deborah Evans will be unable to place feeding tube because department manager, Deborah Evans, informed him that Deborah Evans radiology is unable to provide that service for our patients at [facility].

## 2018-01-14 NOTE — Progress Notes (Signed)
Nutrition Follow-up: Tube feeding rec's  Noted CND tube to be placed at 1300 today  Recommend:  Once tube is placed and verified, initiate Jevity 1.2 at 30 ml/hr and advance by 10 ml q 6 hours to a goal rate of 75 ml/hr. Pt will need an additional 600 ml free water each day to supplement TF's, 100 ml q 4 hours. If pt is able to tolerate  and keep down adequate ( at least 600 ml q day  ) liquids po, the free water flush could be reduced to 30 ml q 4 hours minimum  Please consider advancement to full vol enteral prior to discharge, assessing tolerance and electrolytes PTD  Jevity 1.2 at 75 ml/hr, provides 2160 Kcal/day, 100 g protein and 1460 ml free water  Elisabeth CaraKatherine Kem Hensen M.Odis LusterEd. R.D. LDN Neonatal Nutrition Support Specialist/RD III Pager [phone]      Phone [phone]

## 2018-01-14 NOTE — Progress Notes (Signed)
Per Julian Hyessa at [facility], [facility] at 1300 today. Donzetta Sprungebbie Warren, charge nurse, will make arrangements for transportation with [facility].

## 2018-01-15 NOTE — Discharge Summary (Signed)
Physician Discharge Summary  Patient ID: Deborah Evans MRN: [id number] DOB/AGE: [birth year] 22 y.o.  Admit date: 01/09/2018 Discharge date: 01/15/2018  Admission Diagnoses:hyperemesis gravidarum [redacted]w[redacted]d   Discharge Diagnoses:  Principal Problem:   Hyperemesis gravidarum with dehydration Active Problems:   Hypokalemia   Discharged Condition: fair  Hospital Course: Deborah Evans is a 22 y.o. female G2P0010 @25 .1 weeks presenting for persistent HEG. She was admitted on 12/31/17 and discharged yesterday after tube feeds x4 days. She was able to tolerate one meal at home before N/V resumed. She cannot tolerate anything po. She took Zofran and Phenergan this am but didn't help. She has not used the Scop patch because she thinks it makes it worse. She was given IV fluids, Zofran, Scop patch, and Phenergan in MAU and continued to have emesis.  She stayed until 9/16 when a feeding tube was placed at [facility] IR, but she vomited and the tube came out that evening. She was tolerating a general diet and requested discharge 9/17  Consults: IR for tube placement  Significant Diagnostic Studies: labs:  CBC    Component Value Date/Time   WBC 7.5 12/31/2017 0502   RBC 4.29 12/31/2017 0502   HGB 11.6 (L) 12/31/2017 0502   HGB 11.5 11/08/2017 1417   HCT 35.3 (L) 12/31/2017 0502   HCT 35.3 11/08/2017 1417   PLT 263 12/31/2017 0502   PLT 242 11/08/2017 1417   MCV 82.3 12/31/2017 0502   MCV 80 11/08/2017 1417   MCH 27.0 12/31/2017 0502   MCHC 32.9 12/31/2017 0502   RDW 14.4 12/31/2017 0502   RDW 14.0 11/08/2017 1417   LYMPHSABS 1.3 12/31/2017 0502   LYMPHSABS 1.4 11/08/2017 1417   MONOABS 0.3 12/31/2017 0502   EOSABS 0.0 12/31/2017 0502   EOSABS 0.1 11/08/2017 1417   BASOSABS 0.0 12/31/2017 0502   BASOSABS 0.0 11/08/2017 1417    CMP     Component Value Date/Time   NA 135 01/09/2018 1715   K 3.4 (L) 01/09/2018 1715   CL 101 01/09/2018 1715   CO2 24 01/09/2018 1715   GLUCOSE 93 01/09/2018  1715   BUN 13 01/09/2018 1715   CREATININE 0.57 01/09/2018 1715   CALCIUM 8.9 01/09/2018 1715   PROT 6.5 01/09/2018 1715   ALBUMIN 3.5 01/09/2018 1715   AST 52 (H) 01/09/2018 1715   ALT 58 (H) 01/09/2018 1715   ALKPHOS 68 01/09/2018 1715   BILITOT 0.9 01/09/2018 1715   GFRNONAA >60 01/09/2018 1715   GFRAA >60 01/09/2018 1715      Treatments: IV hydration and feeding tube, antiemetic  Discharge Exam: Blood pressure 117/74, pulse 90, temperature 98.8 F (37.1 C), temperature source Oral, resp. rate 16, height 5\' 8"  (1.727 m), weight 63.7 kg, last menstrual period 08/23/2017, SpO2 99 %, unknown if currently breastfeeding. General appearance: alert, cooperative and no distress GI: soft, non-tender; bowel sounds normal; no masses,  no organomegaly and gravid Extremities: extremities normal, atraumatic, no cyanosis or edema  Disposition: Discharge disposition: 01-Home or Self Care       Discharge Instructions    Discharge patient   Complete by:  As directed    Discharge disposition:  01-Home or Self Care   Discharge patient date:  01/15/2018     Allergies as of 01/15/2018   No Known Allergies     Medication List    TAKE these medications   diphenhydrAMINE 25 mg capsule Commonly known as:  BENADRYL Take 1 capsule (25 mg total) by mouth every 8 (  eight) hours as needed (nausea, vomiting).   famotidine 20 MG tablet Commonly known as:  PEPCID Take 1 tablet (20 mg total) by mouth daily.   metoCLOPramide 10 MG tablet Commonly known as:  REGLAN Take 1 tablet (10 mg total) by mouth 3 (three) times daily before meals.   ondansetron 8 MG disintegrating tablet Commonly known as:  ZOFRAN-ODT Take 1 tablet (8 mg total) by mouth every 8 (eight) hours as needed for nausea or vomiting.   pantoprazole 20 MG tablet Commonly known as:  PROTONIX Take 1 tablet (20 mg total) by mouth 2 (two) times daily.   PRENATAL GUMMIES/DHA & FA PO Take 2 tablets by mouth daily.    promethazine 25 MG tablet Commonly known as:  PHENERGAN Take 1 tablet (25 mg total) by mouth every 6 (six) hours as needed.   promethazine 25 MG suppository Commonly known as:  PHENERGAN Place 1 suppository (25 mg total) rectally every 4 (four) hours as needed for nausea.   pyridOXINE 25 MG tablet Commonly known as:  VITAMIN B-6 Take 1 tablet (25 mg total) by mouth 2 (two) times daily.   scopolamine 1 MG/3DAYS Commonly known as:  TRANSDERM-SCOP Place 1 patch (1.5 mg total) onto the skin every 3 (three) days.            Durable Medical Equipment  (From admission, onward)         Start     Ordered   01/10/18 1157  For home use only DME Tube feeding pump  Once     01/10/18 1156   01/10/18 1157  For home use only DME Tube feeding  Once     01/10/18 1156        Pt did not go home with feeding tube Follow-up Information    [facility] Follow up in 1 week(s).   Specialty:  Obstetrics and Gynecology Contact information: [address] [place] [id number] [phone]          Signed: Scheryl DarterJames Arnold 01/15/2018, 10:09 AM

## 2018-01-15 NOTE — Discharge Instructions (Signed)

## 2018-01-17 ENCOUNTER — Ambulatory Visit (INDEPENDENT_AMBULATORY_CARE_PROVIDER_SITE_OTHER): Payer: BLUE CROSS/BLUE SHIELD | Admitting: Advanced Practice Midwife

## 2018-01-17 ENCOUNTER — Encounter: Payer: Self-pay | Admitting: Advanced Practice Midwife

## 2018-01-17 VITALS — BP 116/70 | HR 107 | Wt 147.0 lb

## 2018-01-17 DIAGNOSIS — Z331 Pregnant state, incidental: Secondary | ICD-10-CM

## 2018-01-17 DIAGNOSIS — Z1389 Encounter for screening for other disorder: Secondary | ICD-10-CM

## 2018-01-17 DIAGNOSIS — Z3482 Encounter for supervision of other normal pregnancy, second trimester: Secondary | ICD-10-CM

## 2018-01-17 DIAGNOSIS — Z3A26 26 weeks gestation of pregnancy: Secondary | ICD-10-CM

## 2018-01-17 LAB — POCT URINALYSIS DIPSTICK OB
GLUCOSE, UA: NEGATIVE
KETONES UA: NEGATIVE
Leukocytes, UA: NEGATIVE
NITRITE UA: NEGATIVE
RBC UA: NEGATIVE

## 2018-01-17 NOTE — Progress Notes (Signed)
  G2P0010 6996w2d Estimated Date of Delivery: 04/23/18  Blood pressure 116/70, pulse (!) 107, weight 147 lb (66.7 kg), last menstrual period 08/23/2017, unknown if currently breastfeeding.   BP weight and urine results all reviewed and noted.  Please refer to the obstetrical flow sheet for the fundal height and fetal heart rate documentation:  Patient reports good fetal movement, denies any bleeding and no rupture of membranes symptoms or regular contractions.   She has had several admissions for HEG, even got a feeding tube twice, dc'd home 9/17 w/o it and was tolerating PO fairly well.  Feels much better! All questions were answered.   Physical Assessment:   Vitals:   01/17/18 1146  BP: 116/70  Pulse: (!) 107  Weight: 147 lb (66.7 kg)  Body mass index is 22.35 kg/m.        Physical Examination:   General appearance: Well appearing, and in no distress  Mental status: Alert, oriented to person, place, and time  Skin: Warm & dry  Cardiovascular: Normal heart rate noted  Respiratory: Normal respiratory effort, no distress  Abdomen: Soft, gravid, nontender  Pelvic: Cervical exam deferred         Extremities: Edema: Trace  Fetal Status: Fetal Heart Rate (bpm): 150   Movement: Present    Results for orders placed or performed in visit on 01/17/18 (from the past 24 hour(s))  POC Urinalysis Dipstick OB   Collection Time: 01/17/18 11:47 AM  Result Value Ref Range   Color, UA     Clarity, UA     Glucose, UA Negative Negative   Bilirubin, UA     Ketones, UA neg    Spec Grav, UA     Blood, UA neg    pH, UA     POC Protein UA Trace Negative, Trace   Urobilinogen, UA     Nitrite, UA neg    Leukocytes, UA Negative Negative   Appearance     Odor       Orders Placed This Encounter  Procedures  . POC Urinalysis Dipstick OB    Plan:  Continued routine obstetrical care,   Return in about 3 weeks (around 02/07/2018) for Friday for PN2 only; , LROB.

## 2018-01-17 NOTE — Patient Instructions (Signed)

## 2018-01-18 ENCOUNTER — Other Ambulatory Visit: Payer: BLUE CROSS/BLUE SHIELD

## 2018-01-18 DIAGNOSIS — Z131 Encounter for screening for diabetes mellitus: Secondary | ICD-10-CM

## 2018-01-18 DIAGNOSIS — Z3A26 26 weeks gestation of pregnancy: Secondary | ICD-10-CM

## 2018-01-18 DIAGNOSIS — Z3482 Encounter for supervision of other normal pregnancy, second trimester: Secondary | ICD-10-CM

## 2018-01-19 LAB — GLUCOSE TOLERANCE, 2 HOURS W/ 1HR
GLUCOSE, FASTING: 75 mg/dL (ref 65–91)
Glucose, 1 hour: 118 mg/dL (ref 65–179)
Glucose, 2 hour: 120 mg/dL (ref 65–152)

## 2018-01-19 LAB — CBC
HEMOGLOBIN: 10.2 g/dL — AB (ref 11.1–15.9)
Hematocrit: 32.3 % — ABNORMAL LOW (ref 34.0–46.6)
MCH: 27.2 pg (ref 26.6–33.0)
MCHC: 31.6 g/dL (ref 31.5–35.7)
MCV: 86 fL (ref 79–97)
Platelets: 185 10*3/uL (ref 150–450)
RBC: 3.75 x10E6/uL — ABNORMAL LOW (ref 3.77–5.28)
RDW: 15.3 % (ref 12.3–15.4)
WBC: 5.9 10*3/uL (ref 3.4–10.8)

## 2018-01-19 LAB — ANTIBODY SCREEN: ANTIBODY SCREEN: NEGATIVE

## 2018-01-19 LAB — RPR: RPR: NONREACTIVE

## 2018-01-19 LAB — HIV ANTIBODY (ROUTINE TESTING W REFLEX): HIV Screen 4th Generation wRfx: NONREACTIVE

## 2018-02-07 ENCOUNTER — Ambulatory Visit (INDEPENDENT_AMBULATORY_CARE_PROVIDER_SITE_OTHER): Payer: BLUE CROSS/BLUE SHIELD | Admitting: Advanced Practice Midwife

## 2018-02-07 ENCOUNTER — Encounter: Payer: Self-pay | Admitting: Advanced Practice Midwife

## 2018-02-07 VITALS — BP 102/70 | HR 91 | Wt 141.0 lb

## 2018-02-07 DIAGNOSIS — Z23 Encounter for immunization: Secondary | ICD-10-CM | POA: Diagnosis not present

## 2018-02-07 DIAGNOSIS — Z3A29 29 weeks gestation of pregnancy: Secondary | ICD-10-CM

## 2018-02-07 DIAGNOSIS — Z1389 Encounter for screening for other disorder: Secondary | ICD-10-CM

## 2018-02-07 DIAGNOSIS — Z331 Pregnant state, incidental: Secondary | ICD-10-CM

## 2018-02-07 DIAGNOSIS — Z3483 Encounter for supervision of other normal pregnancy, third trimester: Secondary | ICD-10-CM

## 2018-02-07 LAB — POCT URINALYSIS DIPSTICK OB
Blood, UA: NEGATIVE
GLUCOSE, UA: NEGATIVE
Leukocytes, UA: NEGATIVE
Nitrite, UA: NEGATIVE

## 2018-02-07 NOTE — Addendum Note (Signed)
Addended by: Federico Flake A on: 02/07/2018 08:58 AM   Modules accepted: Orders

## 2018-02-07 NOTE — Progress Notes (Signed)
  G2P0010 [redacted]w[redacted]d Estimated Date of Delivery: 04/23/18  Blood pressure 102/70, pulse 91, weight 141 lb (64 kg), last menstrual period 08/23/2017, unknown if currently breastfeeding.   BP weight and urine results all reviewed and noted.  Please refer to the obstetrical flow sheet for the fundal height and fetal heart rate documentation:  Patient reports good fetal movement, denies any bleeding and no rupture of membranes symptoms or regular contractions. Patient lost 6 lbs in 3 weeks says she feels good and is eating.  .  All questions were answered.   Physical Assessment:   Vitals:   02/07/18 0833  BP: 102/70  Pulse: 91  Weight: 141 lb (64 kg)  Body mass index is 21.44 kg/m.        Physical Examination:   General appearance: Well appearing, and in no distress  Mental status: Alert, oriented to person, place, and time  Skin: Warm & dry  Cardiovascular: Normal heart rate noted  Respiratory: Normal respiratory effort, no distress  Abdomen: Soft, gravid, nontender  Pelvic: Cervical exam deferred         Extremities: Edema: None  Fetal Status:     Movement: Present    Results for orders placed or performed in visit on 02/07/18 (from the past 24 hour(s))  POC Urinalysis Dipstick OB   Collection Time: 02/07/18  8:41 AM  Result Value Ref Range   Color, UA     Clarity, UA     Glucose, UA Negative Negative   Bilirubin, UA     Ketones, UA small    Spec Grav, UA     Blood, UA neg    pH, UA     POC Protein UA Trace Negative, Trace   Urobilinogen, UA     Nitrite, UA neg    Leukocytes, UA Negative Negative   Appearance     Odor       Orders Placed This Encounter  Procedures  . POC Urinalysis Dipstick OB    Plan:  Continued routine obstetrical care,   Return in about 2 weeks (around 02/21/2018) for LROB.

## 2018-02-07 NOTE — Patient Instructions (Signed)
Deborah Evans, I greatly value your feedback.  If you receive a survey following your visit with [place] today, we appreciate you taking the time to fill it out.  Thanks, Cathie Beams, CNM   Call the office [phone]) or go to [facility] if:  You begin to have strong, frequent contractions  Your water breaks.  Sometimes it is a big gush of fluid, sometimes it is just a trickle that keeps getting your panties wet or running down your legs  You have vaginal bleeding.  It is normal to have a small amount of spotting if your cervix was checked.   You don't feel your baby moving like normal.  If you don't, get you something to eat and drink and lay down and focus on feeling your baby move.  You should feel at least 10 movements in 2 hours.  If you don't, you should call the office or go to [facility].    Tdap Vaccine  It is recommended that you get the Tdap vaccine during the third trimester of EACH pregnancy to help protect your baby from getting pertussis (whooping cough)  27-36 weeks is the BEST time to do this so that you can pass the protection on to your baby. During pregnancy is better than after pregnancy, but if you are unable to get it during pregnancy it will be offered at the hospital.   You will be offered this vaccine in the office after 27 weeks. If you do not have health insurance, you can get this vaccine at the health department or your family doctor  Everyone who will be around your baby should also be up-to-date on their vaccines. Adults (who are not pregnant) only need 1 dose of Tdap during adulthood.   Third Trimester of Pregnancy The third trimester is from week 29 through week 42, months 7 through 9. The third trimester is a time when the fetus is growing rapidly. At the end of the ninth month, the fetus is about 20 inches in length and weighs 6-10 pounds.  BODY CHANGES Your body goes through many changes during pregnancy. The changes vary from woman to  woman.   Your weight will continue to increase. You can expect to gain 25-35 pounds (11-16 kg) by the end of the pregnancy.  You may begin to get stretch marks on your hips, abdomen, and breasts.  You may urinate more often because the fetus is moving lower into your pelvis and pressing on your bladder.  You may develop or continue to have heartburn as a result of your pregnancy.  You may develop constipation because certain hormones are causing the muscles that push waste through your intestines to slow down.  You may develop hemorrhoids or swollen, bulging veins (varicose veins).  You may have pelvic pain because of the weight gain and pregnancy hormones relaxing your joints between the bones in your pelvis. Backaches may result from overexertion of the muscles supporting your posture.  You may have changes in your hair. These can include thickening of your hair, rapid growth, and changes in texture. Some women also have hair loss during or after pregnancy, or hair that feels dry or thin. Your hair will most likely return to normal after your baby is born.  Your breasts will continue to grow and be tender. A yellow discharge may leak from your breasts called colostrum.  Your belly button may stick out.  You may feel short of breath because of your expanding uterus.  You may notice the fetus "dropping," or moving lower in your abdomen.  You may have a bloody mucus discharge. This usually occurs a few days to a week before labor begins.  Your cervix becomes thin and soft (effaced) near your due date. WHAT TO EXPECT AT YOUR PRENATAL EXAMS  You will have prenatal exams every 2 weeks until week 36. Then, you will have weekly prenatal exams. During a routine prenatal visit:  You will be weighed to make sure you and the fetus are growing normally.  Your blood pressure is taken.  Your abdomen will be measured to track your baby's growth.  The fetal heartbeat will be listened  to.  Any test results from the previous visit will be discussed.  You may have a cervical check near your due date to see if you have effaced. At around 36 weeks, your caregiver will check your cervix. At the same time, your caregiver will also perform a test on the secretions of the vaginal tissue. This test is to determine if a type of bacteria, Group B streptococcus, is present. Your caregiver will explain this further. Your caregiver may ask you:  What your birth plan is.  How you are feeling.  If you are feeling the baby move.  If you have had any abnormal symptoms, such as leaking fluid, bleeding, severe headaches, or abdominal cramping.  If you have any questions. Other tests or screenings that may be performed during your third trimester include:  Blood tests that check for low iron levels (anemia).  Fetal testing to check the health, activity level, and growth of the fetus. Testing is done if you have certain medical conditions or if there are problems during the pregnancy. FALSE LABOR You may feel small, irregular contractions that eventually go away. These are called Braxton Hicks contractions, or false labor. Contractions may last for hours, days, or even weeks before true labor sets in. If contractions come at regular intervals, intensify, or become painful, it is best to be seen by your caregiver.  SIGNS OF LABOR   Menstrual-like cramps.  Contractions that are 5 minutes apart or less.  Contractions that start on the top of the uterus and spread down to the lower abdomen and back.  A sense of increased pelvic pressure or back pain.  A watery or bloody mucus discharge that comes from the vagina. If you have any of these signs before the 37th week of pregnancy, call your caregiver right away. You need to go to the hospital to get checked immediately. HOME CARE INSTRUCTIONS   Avoid all smoking, herbs, alcohol, and unprescribed drugs. These chemicals affect the  formation and growth of the baby.  Follow your caregiver's instructions regarding medicine use. There are medicines that are either safe or unsafe to take during pregnancy.  Exercise only as directed by your caregiver. Experiencing uterine cramps is a good sign to stop exercising.  Continue to eat regular, healthy meals.  Wear a good support bra for breast tenderness.  Do not use hot tubs, steam rooms, or saunas.  Wear your seat belt at all times when driving.  Avoid raw meat, uncooked cheese, cat litter boxes, and soil used by cats. These carry germs that can cause birth defects in the baby.  Take your prenatal vitamins.  Try taking a stool softener (if your caregiver approves) if you develop constipation. Eat more high-fiber foods, such as fresh vegetables or fruit and whole grains. Drink plenty of fluids to keep your urine  clear or pale yellow.  Take warm sitz baths to soothe any pain or discomfort caused by hemorrhoids. Use hemorrhoid cream if your caregiver approves.  If you develop varicose veins, wear support hose. Elevate your feet for 15 minutes, 3-4 times a day. Limit salt in your diet.  Avoid heavy lifting, wear low heal shoes, and practice good posture.  Rest a lot with your legs elevated if you have leg cramps or low back pain.  Visit your dentist if you have not gone during your pregnancy. Use a soft toothbrush to brush your teeth and be gentle when you floss.  A sexual relationship may be continued unless your caregiver directs you otherwise.  Do not travel far distances unless it is absolutely necessary and only with the approval of your caregiver.  Take prenatal classes to understand, practice, and ask questions about the labor and delivery.  Make a trial run to the hospital.  Pack your hospital bag.  Prepare the baby's nursery.  Continue to go to all your prenatal visits as directed by your caregiver. SEEK MEDICAL CARE IF:  You are unsure if you are in  labor or if your water has broken.  You have dizziness.  You have mild pelvic cramps, pelvic pressure, or nagging pain in your abdominal area.  You have persistent nausea, vomiting, or diarrhea.  You have a bad smelling vaginal discharge.  You have pain with urination. SEEK IMMEDIATE MEDICAL CARE IF:   You have a fever.  You are leaking fluid from your vagina.  You have spotting or bleeding from your vagina.  You have severe abdominal cramping or pain.  You have rapid weight loss or gain.  You have shortness of breath with chest pain.  You notice sudden or extreme swelling of your face, hands, ankles, feet, or legs.  You have not felt your baby move in over an hour.  You have severe headaches that do not go away with medicine.  You have vision changes. Document Released: 04/11/2001 Document Revised: 04/22/2013 Document Reviewed: 06/18/2012 [facility] Patient Information 2015 [place], [place]. This information is not intended to replace advice given to you by your health care provider. Make sure you discuss any questions you have with your health care provider.

## 2018-02-11 ENCOUNTER — Other Ambulatory Visit: Payer: Self-pay

## 2018-02-11 ENCOUNTER — Encounter (HOSPITAL_COMMUNITY): Payer: Self-pay

## 2018-02-11 ENCOUNTER — Inpatient Hospital Stay (HOSPITAL_COMMUNITY)
Admission: AD | Admit: 2018-02-11 | Discharge: 2018-02-11 | Disposition: A | Discharge status: Home / Self Care | Payer: Medicaid Other | Source: Ambulatory Visit | Attending: Obstetrics and Gynecology | Admitting: Obstetrics and Gynecology

## 2018-02-11 DIAGNOSIS — E876 Hypokalemia: Secondary | ICD-10-CM | POA: Diagnosis not present

## 2018-02-11 DIAGNOSIS — O212 Late vomiting of pregnancy: Secondary | ICD-10-CM

## 2018-02-11 DIAGNOSIS — O211 Hyperemesis gravidarum with metabolic disturbance: Secondary | ICD-10-CM | POA: Insufficient documentation

## 2018-02-11 DIAGNOSIS — Z3A29 29 weeks gestation of pregnancy: Secondary | ICD-10-CM | POA: Diagnosis not present

## 2018-02-11 DIAGNOSIS — O210 Mild hyperemesis gravidarum: Secondary | ICD-10-CM | POA: Diagnosis present

## 2018-02-11 DIAGNOSIS — Z3483 Encounter for supervision of other normal pregnancy, third trimester: Secondary | ICD-10-CM

## 2018-02-11 LAB — URINALYSIS, ROUTINE W REFLEX MICROSCOPIC
Bilirubin Urine: NEGATIVE
Glucose, UA: NEGATIVE mg/dL
Hgb urine dipstick: NEGATIVE
Ketones, ur: 80 mg/dL — AB
Nitrite: NEGATIVE
Protein, ur: 30 mg/dL — AB
Specific Gravity, Urine: 1.023 (ref 1.005–1.030)
pH: 6 (ref 5.0–8.0)

## 2018-02-11 LAB — COMPREHENSIVE METABOLIC PANEL
ALT: 14 U/L (ref 0–44)
AST: 21 U/L (ref 15–41)
Albumin: 3.6 g/dL (ref 3.5–5.0)
Alkaline Phosphatase: 88 U/L (ref 38–126)
Anion gap: 13 (ref 5–15)
BUN: 7 mg/dL (ref 6–20)
CO2: 19 mmol/L — ABNORMAL LOW (ref 22–32)
Calcium: 9 mg/dL (ref 8.9–10.3)
Chloride: 104 mmol/L (ref 98–111)
Creatinine, Ser: 0.38 mg/dL — ABNORMAL LOW (ref 0.44–1.00)
GFR calc Af Amer: >60 mL/min (ref >60)
GFR calc non Af Amer: >60 mL/min (ref >60)
Glucose, Bld: 73 mg/dL (ref 70–99)
Potassium: 3.3 mmol/L — ABNORMAL LOW (ref 3.5–5.1)
Sodium: 136 mmol/L (ref 135–145)
Total Bilirubin: 0.9 mg/dL (ref 0.3–1.2)
Total Protein: 7.3 g/dL (ref 6.5–8.1)

## 2018-02-11 LAB — CBC
HCT: 34.4 % — ABNORMAL LOW (ref 36.0–46.0)
Hemoglobin: 11.4 g/dL — ABNORMAL LOW (ref 12.0–15.0)
MCH: 27.6 pg (ref 26.0–34.0)
MCHC: 33.1 g/dL (ref 30.0–36.0)
MCV: 83.3 fL (ref 80.0–100.0)
Platelets: 214 10*3/uL (ref 150–400)
RBC: 4.13 MIL/uL (ref 3.87–5.11)
RDW: 13.6 % (ref 11.5–15.5)
WBC: 7.3 10*3/uL (ref 4.0–10.5)
nRBC: 0 % (ref 0.0–0.2)

## 2018-02-11 MED ORDER — PROCHLORPERAZINE EDISYLATE 10 MG/2ML IJ SOLN
10.0000 mg | Freq: Once | INTRAMUSCULAR | Status: AC
  Administered 2018-02-11: 10 mg via INTRAVENOUS
  Filled 2018-02-11: qty 2

## 2018-02-11 MED ORDER — POTASSIUM CHLORIDE ER 10 MEQ PO TBCR: 10.0000 meq | EXTENDED_RELEASE_TABLET | Freq: Every day | ORAL | 0 refills | Status: DC

## 2018-02-11 MED ORDER — LACTATED RINGERS IV BOLUS
1000.0000 mL | Freq: Once | INTRAVENOUS | Status: AC
  Administered 2018-02-11: 1000 mL via INTRAVENOUS

## 2018-02-11 MED ORDER — PROCHLORPERAZINE MALEATE 10 MG PO TABS: 10.0000 mg | ORAL_TABLET | Freq: Two times a day (BID) | ORAL | 1 refills | Status: DC | PRN

## 2018-02-11 MED ORDER — M.V.I. ADULT IV INJ
Freq: Once | INTRAVENOUS | Status: AC
  Administered 2018-02-11: 14:00:00 via INTRAVENOUS
  Filled 2018-02-11: qty 10

## 2018-02-11 NOTE — MAU Note (Signed)
Pt presents to MAU with complaints of not being able to keep anything down for a couple of days. PT states that she has had a NG tube earlier in her pregnancy for hyperemesis

## 2018-02-11 NOTE — MAU Provider Note (Signed)
History     CSN: [id number]  Arrival date and time: 02/11/18 1041   First Provider Initiated Contact with Patient 02/11/18 1143      Chief Complaint  Patient presents with  . Nausea  . Emesis   Deborah Evans is a 22 y.o. G2P0 at [redacted]w[redacted]d who presents to MAU with complaints of continued Hyperemesis. She reports nausea and vomiting being worse over the past 24 hours and vomiting 10+ times. She reports being on zofran, phenergan and reglan. She reports last taking the medication on 10/12. Patient reports being 153lbs at beginning of pregnancy. She denies other pregnancy related concerns. Denies abdominal pain, urinary symptoms, HA. Reports +FM.    OB History    Gravida  2   Para      Term      Preterm      AB  1   Living  0     SAB  1   TAB      Ectopic      Multiple      Live Births              Past Medical History:  Diagnosis Date  . Hyperemesis gravidarum     Past Surgical History:  Procedure Laterality Date  . DILATION AND CURETTAGE OF UTERUS N/A 07/15/2014   Procedure: SUCTION DILATATION AND CURETTAGE;  Surgeon: Lazaro Arms, MD;  Location: [facility];  Service: Gynecology;  Laterality: N/A;    Family History  Problem Relation Age of Onset  . Hypertension Maternal Grandmother   . ADD / ADHD Neg Hx   . Alcohol abuse Neg Hx   . Anxiety disorder Neg Hx   . Arthritis Neg Hx   . Birth defects Neg Hx   . Asthma Neg Hx   . Cancer Neg Hx   . COPD Neg Hx   . Depression Neg Hx   . Diabetes Neg Hx   . Drug abuse Neg Hx   . Early death Neg Hx   . Hearing loss Neg Hx   . Heart disease Neg Hx   . Hyperlipidemia Neg Hx   . Intellectual disability Neg Hx   . Kidney disease Neg Hx   . Learning disabilities Neg Hx   . Miscarriages / Stillbirths Neg Hx   . Obesity Neg Hx   . Stroke Neg Hx   . Vision loss Neg Hx   . Varicose Veins Neg Hx     Social History   Tobacco Use  . Smoking status: Never Smoker  . Smokeless tobacco: Never Used  Substance  Use Topics  . Alcohol use: No  . Drug use: No    Allergies: No Known Allergies  Medications Prior to Admission  Medication Sig Dispense Refill Last Dose  . metoCLOPramide (REGLAN) 10 MG tablet Take 1 tablet (10 mg total) by mouth 3 (three) times daily before meals. 90 tablet 1 Taking  . ondansetron (ZOFRAN-ODT) 8 MG disintegrating tablet Take 1 tablet (8 mg total) by mouth every 8 (eight) hours as needed for nausea or vomiting. 45 tablet 3 Taking  . Prenatal MV-Min-FA-Omega-3 (PRENATAL GUMMIES/DHA & FA PO) Take 2 tablets by mouth daily.    Taking  . promethazine (PHENERGAN) 25 MG suppository Place 1 suppository (25 mg total) rectally every 4 (four) hours as needed for nausea. (Patient not taking: Reported on 02/07/2018) 12 each 2 Not Taking  . promethazine (PHENERGAN) 25 MG tablet Take 1 tablet (25 mg total) by mouth every 6 (  six) hours as needed. 30 tablet 2 Taking    Review of Systems  Constitutional: Negative.   Respiratory: Negative.   Cardiovascular: Negative.   Gastrointestinal: Positive for nausea and vomiting. Negative for abdominal pain, constipation and diarrhea.  Genitourinary: Negative.   Neurological: Negative.    Physical Exam   Blood pressure 116/76, pulse (!) 104, temperature 97.7 F (36.5 C), resp. rate 16, height 5\' 8"  (1.727 m), weight 62.1 kg, last menstrual period 08/23/2017, unknown if currently breastfeeding.  Physical Exam  Nursing note and vitals reviewed. Constitutional: She is oriented to person, place, and time. She appears well-developed.  Cardiovascular: Normal rate, regular rhythm and normal heart sounds.  Respiratory: Effort normal and breath sounds normal. No respiratory distress. She has no wheezes.  GI:  Gravid small for gestational age  Neurological: She is alert and oriented to person, place, and time.  Skin: Skin is warm and dry. There is pallor.  Psychiatric: She has a normal mood and affect. Her behavior is normal. Thought content normal.    FHR: 125/ moderate/ +accels/ no decelerations  Toco: no uterine contractions   MAU Course  Procedures  MDM Orders Placed This Encounter  Procedures  . Urinalysis, Routine w reflex microscopic  . CBC  . Comprehensive metabolic panel   Current weight 62.1kg today, was 64 kg on 10/10. Has lost 5 pounds in the past 4 days. Patient has been on Zofran, reglan, phernergan, and scope patch during this pregnancy- has not tried compazine. Treatments in MAU included LR bolus, banana bag, 10mg  Compazine IV.  Patient able to tolerate crackers and water after treatment.  Consult with Dr Jolayne Panther on assessment and management. Okay to discharge home with new treatment plan, follow up as scheduled for prenatal visits. Educated and discussed eating plan with hyperemesis and not eating greasy food, reviewed small meals. Rx for Compazine sent to pharmacy of choice. Pt stable at time of discharge.  Meds ordered this encounter  Medications  . lactated ringers bolus 1,000 mL  . multivitamins adult (MVI -12) 10 mL in dextrose 5% lactated ringers 1,000 mL infusion  . prochlorperazine (COMPAZINE) injection 10 mg  . prochlorperazine (COMPAZINE) 10 MG tablet    Sig: Take 1 tablet (10 mg total) by mouth 2 (two) times daily as needed for nausea or vomiting.    Dispense:  30 tablet    Refill:  1    Order Specific Question:   Supervising Provider    Answer:   CONSTANT, PEGGY [[id number]]  . potassium chloride (K-DUR) 10 MEQ tablet    Sig: Take 1 tablet (10 mEq total) by mouth daily.    Dispense:  10 tablet    Refill:  0    Order Specific Question:   Supervising Provider    Answer:   CONSTANT, PEGGY [[id number]]   NST reactive  CBC- WNL CMP- showed mild hypokalemia  Results for orders placed or performed during the hospital encounter of 02/11/18 (from the past 24 hour(s))  Urinalysis, Routine w reflex microscopic     Status: Abnormal   Collection Time: 02/11/18 11:05 AM  Result Value Ref Range   Color, Urine  YELLOW YELLOW   APPearance HAZY (A) CLEAR   Specific Gravity, Urine 1.023 1.005 - 1.030   pH 6.0 5.0 - 8.0   Glucose, UA NEGATIVE NEGATIVE mg/dL   Hgb urine dipstick NEGATIVE NEGATIVE   Bilirubin Urine NEGATIVE NEGATIVE   Ketones, ur 80 (A) NEGATIVE mg/dL   Protein, ur 30 (A) NEGATIVE mg/dL  Nitrite NEGATIVE NEGATIVE   Leukocytes, UA TRACE (A) NEGATIVE   RBC / HPF 0-5 0 - 5 RBC/hpf   WBC, UA 11-20 0 - 5 WBC/hpf   Bacteria, UA FEW (A) NONE SEEN   Squamous Epithelial / LPF 6-10 0 - 5   Mucus PRESENT   CBC     Status: Abnormal   Collection Time: 02/11/18  1:14 PM  Result Value Ref Range   WBC 7.3 4.0 - 10.5 K/uL   RBC 4.13 3.87 - 5.11 MIL/uL   Hemoglobin 11.4 (L) 12.0 - 15.0 g/dL   HCT 81.1 (L) 91.4 - 78.2 %   MCV 83.3 80.0 - 100.0 fL   MCH 27.6 26.0 - 34.0 pg   MCHC 33.1 30.0 - 36.0 g/dL   RDW 95.6 21.3 - 08.6 %   Platelets 214 150 - 400 K/uL   nRBC 0.0 0.0 - 0.2 %  Comprehensive metabolic panel     Status: Abnormal   Collection Time: 02/11/18  1:14 PM  Result Value Ref Range   Sodium 136 135 - 145 mmol/L   Potassium 3.3 (L) 3.5 - 5.1 mmol/L   Chloride 104 98 - 111 mmol/L   CO2 19 (L) 22 - 32 mmol/L   Glucose, Bld 73 70 - 99 mg/dL   BUN 7 6 - 20 mg/dL   Creatinine, Ser 5.78 (L) 0.44 - 1.00 mg/dL   Calcium 9.0 8.9 - 46.9 mg/dL   Total Protein 7.3 6.5 - 8.1 g/dL   Albumin 3.6 3.5 - 5.0 g/dL   AST 21 15 - 41 U/L   ALT 14 0 - 44 U/L   Alkaline Phosphatase 88 38 - 126 U/L   Total Bilirubin 0.9 0.3 - 1.2 mg/dL   GFR calc non Af Amer >60 >60 mL/min   GFR calc Af Amer >60 >60 mL/min   Anion gap 13 5 - 15   Assessment and Plan   1. Hyperemesis gravidarum   2. Encounter for supervision of other normal pregnancy in third trimester   3. Hypokalemia   4. [redacted] weeks gestation of pregnancy    Discharge home Follow upas scheduled for prenatal appointments  Return to MAU as needed Eating plan for hyperemesis  Rx for Compazine   Follow-up Information    [facility]  Follow up.   Specialty:  Obstetrics and Gynecology Why:  Follow up as scheduled for prenatal appointments Contact information: [address] [place] [address] [phone]          Allergies as of 02/11/2018   No Known Allergies     Medication List    TAKE these medications   metoCLOPramide 10 MG tablet Commonly known as:  REGLAN Take 1 tablet (10 mg total) by mouth 3 (three) times daily before meals.   ondansetron 8 MG disintegrating tablet Commonly known as:  ZOFRAN-ODT Take 1 tablet (8 mg total) by mouth every 8 (eight) hours as needed for nausea or vomiting.   potassium chloride 10 MEQ tablet Commonly known as:  K-DUR Take 1 tablet (10 mEq total) by mouth daily.   PRENATAL GUMMIES/DHA & FA PO Take 2 tablets by mouth daily.   prochlorperazine 10 MG tablet Commonly known as:  COMPAZINE Take 1 tablet (10 mg total) by mouth 2 (two) times daily as needed for nausea or vomiting.   promethazine 25 MG tablet Commonly known as:  PHENERGAN Take 1 tablet (25 mg total) by mouth every 6 (six) hours as needed.  promethazine 25 MG suppository Commonly known as:  PHENERGAN Place 1 suppository (25 mg total) rectally every 4 (four) hours as needed for nausea.      Sharyon Cable CNM 02/11/2018, 4:16 PM

## 2018-02-11 NOTE — MAU Note (Signed)
Pt is a G2P0 at 29.6 with history of hyperemesis this pregnancy.  Pt reports being unable to eat or drink for over 24 with nausea and vomiting.  No other OB concerns.

## 2018-02-15 ENCOUNTER — Inpatient Hospital Stay (HOSPITAL_COMMUNITY)
Admission: AD | Admit: 2018-02-15 | Discharge: 2018-02-15 | Disposition: A | Discharge status: Home / Self Care | Payer: Medicaid Other | Source: Ambulatory Visit | Attending: Obstetrics and Gynecology | Admitting: Obstetrics and Gynecology

## 2018-02-15 ENCOUNTER — Other Ambulatory Visit: Payer: Self-pay

## 2018-02-15 ENCOUNTER — Encounter (HOSPITAL_COMMUNITY): Payer: Self-pay

## 2018-02-15 DIAGNOSIS — Z3A30 30 weeks gestation of pregnancy: Secondary | ICD-10-CM | POA: Diagnosis not present

## 2018-02-15 DIAGNOSIS — O212 Late vomiting of pregnancy: Secondary | ICD-10-CM | POA: Diagnosis not present

## 2018-02-15 DIAGNOSIS — O219 Vomiting of pregnancy, unspecified: Secondary | ICD-10-CM

## 2018-02-15 DIAGNOSIS — E860 Dehydration: Secondary | ICD-10-CM

## 2018-02-15 DIAGNOSIS — O2613 Low weight gain in pregnancy, third trimester: Secondary | ICD-10-CM

## 2018-02-15 LAB — COMPREHENSIVE METABOLIC PANEL
ALK PHOS: 97 U/L (ref 38–126)
ALT: 13 U/L (ref 0–44)
AST: 22 U/L (ref 15–41)
Albumin: 3.6 g/dL (ref 3.5–5.0)
Anion gap: 8 (ref 5–15)
BILIRUBIN TOTAL: 1.1 mg/dL (ref 0.3–1.2)
BUN: 9 mg/dL (ref 6–20)
CALCIUM: 8.9 mg/dL (ref 8.9–10.3)
CHLORIDE: 102 mmol/L (ref 98–111)
CO2: 22 mmol/L (ref 22–32)
CREATININE: 0.5 mg/dL (ref 0.44–1.00)
GFR calc Af Amer: >60 mL/min (ref >60)
Glucose, Bld: 85 mg/dL (ref 70–99)
Potassium: 3.2 mmol/L — ABNORMAL LOW (ref 3.5–5.1)
Sodium: 132 mmol/L — ABNORMAL LOW (ref 135–145)
TOTAL PROTEIN: 6.5 g/dL (ref 6.5–8.1)

## 2018-02-15 LAB — URINALYSIS, ROUTINE W REFLEX MICROSCOPIC
Bilirubin Urine: NEGATIVE
GLUCOSE, UA: NEGATIVE mg/dL
Hgb urine dipstick: NEGATIVE
Ketones, ur: 80 mg/dL — AB
NITRITE: NEGATIVE
PH: 6 (ref 5.0–8.0)
Protein, ur: 30 mg/dL — AB
SPECIFIC GRAVITY, URINE: 1.023 (ref 1.005–1.030)

## 2018-02-15 LAB — CBC
HEMATOCRIT: 33 % — AB (ref 36.0–46.0)
Hemoglobin: 11 g/dL — ABNORMAL LOW (ref 12.0–15.0)
MCH: 27.3 pg (ref 26.0–34.0)
MCHC: 33.3 g/dL (ref 30.0–36.0)
MCV: 81.9 fL (ref 80.0–100.0)
Platelets: 220 10*3/uL (ref 150–400)
RBC: 4.03 MIL/uL (ref 3.87–5.11)
RDW: 13.5 % (ref 11.5–15.5)
WBC: 6 10*3/uL (ref 4.0–10.5)
nRBC: 0 % (ref 0.0–0.2)

## 2018-02-15 LAB — MAGNESIUM: Magnesium: 2.2 mg/dL (ref 1.7–2.4)

## 2018-02-15 MED ORDER — POTASSIUM CHLORIDE CRYS ER 20 MEQ PO TBCR
40.0000 meq | EXTENDED_RELEASE_TABLET | Freq: Once | ORAL | Status: AC
  Administered 2018-02-15: 40 meq via ORAL
  Filled 2018-02-15: qty 2

## 2018-02-15 MED ORDER — ONDANSETRON 4 MG PO TBDP
4.0000 mg | ORAL_TABLET | Freq: Once | ORAL | Status: AC
  Administered 2018-02-15: 4 mg via ORAL
  Filled 2018-02-15: qty 1

## 2018-02-15 MED ORDER — LACTATED RINGERS IV BOLUS
1000.0000 mL | Freq: Once | INTRAVENOUS | Status: AC
  Administered 2018-02-15: 1000 mL via INTRAVENOUS

## 2018-02-15 MED ORDER — SCOPOLAMINE 1 MG/3DAYS TD PT72
1.0000 | MEDICATED_PATCH | TRANSDERMAL | Status: DC
  Administered 2018-02-15: 1.5 mg via TRANSDERMAL
  Filled 2018-02-15: qty 1

## 2018-02-15 MED ORDER — PROMETHAZINE HCL 12.5 MG PO TABS: 12.5000 mg | ORAL_TABLET | Freq: Four times a day (QID) | ORAL | 0 refills | Status: DC

## 2018-02-15 MED ORDER — VITAMIN B-6 50 MG PO TABS: 50.0000 mg | ORAL_TABLET | Freq: Every day | ORAL | 0 refills | Status: DC

## 2018-02-15 MED ORDER — ONDANSETRON 4 MG PO TBDP: 4.0000 mg | ORAL_TABLET | Freq: Three times a day (TID) | ORAL | 0 refills | Status: DC | PRN

## 2018-02-15 NOTE — MAU Note (Signed)
Can't keep nothing down.

## 2018-02-15 NOTE — MAU Provider Note (Signed)
History    CSN: [id number] Arrival date and time: 02/15/18 1239 First Provider Initiated Contact with Patient 02/15/18 1317     Chief Complaint  Patient presents with  . Emesis   HPI 22yo G2P0010 at [redacted]w[redacted]d who presents with recurrent nausea, vomiting. States she was seen in the MAU a couple days ago for same and started on compazine. States she started throwing up overnight after leaving MAU and threw up 30 times yesterday. States she has been trying bland foods but continuing to vomit. She hasn't had a bowel movement in over a week. Denies any abdominal pain. Denies any blood in emesis, states looks yellow/white with food particles. Trying to keep water down but doesn't usually work. Feels like she has lost some weight.   She has been admitted 5 times this pregnancy for hyperemesis. Trials have included the following:   Reglan, phenergan  hypoMg++/hypoK+  started on meclizine   prednisone taper, compazine, meclizine, zofran, Dobbhoff tube placement with Jevity feeds (removed after 3 days)  Scopolamine patch   Additional feeding tube placed 9/16 but came out with vomiting   OB History    Gravida  2   Para      Term      Preterm      AB  1   Living  0     SAB  1   TAB      Ectopic      Multiple      Live Births              Past Medical History:  Diagnosis Date  . Hyperemesis gravidarum     Past Surgical History:  Procedure Laterality Date  . DILATION AND CURETTAGE OF UTERUS N/A 07/15/2014   Procedure: SUCTION DILATATION AND CURETTAGE;  Surgeon: Lazaro Arms, MD;  Location: [facility];  Service: Gynecology;  Laterality: N/A;    Family History  Problem Relation Age of Onset  . Hypertension Maternal Grandmother   . ADD / ADHD Neg Hx   . Alcohol abuse Neg Hx   . Anxiety disorder Neg Hx   . Arthritis Neg Hx   . Birth defects Neg Hx   . Asthma Neg Hx   . Cancer Neg Hx   . COPD Neg Hx   . Depression Neg Hx   . Diabetes Neg Hx   . Drug abuse Neg Hx    . Early death Neg Hx   . Hearing loss Neg Hx   . Heart disease Neg Hx   . Hyperlipidemia Neg Hx   . Intellectual disability Neg Hx   . Kidney disease Neg Hx   . Learning disabilities Neg Hx   . Miscarriages / Stillbirths Neg Hx   . Obesity Neg Hx   . Stroke Neg Hx   . Vision loss Neg Hx   . Varicose Veins Neg Hx     Social History   Tobacco Use  . Smoking status: Never Smoker  . Smokeless tobacco: Never Used  Substance Use Topics  . Alcohol use: No  . Drug use: No    Allergies: No Known Allergies  No medications prior to admission.    Review of Systems  Constitutional: Positive for fatigue and unexpected weight change. Negative for fever.  HENT: Negative for congestion.   Respiratory: Negative for cough and shortness of breath.   Cardiovascular: Negative for chest pain and palpitations.  Gastrointestinal: Positive for constipation (no BM in over 1 week), nausea and vomiting. Negative for  abdominal pain.  Genitourinary: Negative for dysuria, flank pain, vaginal bleeding and vaginal discharge.  Musculoskeletal: Negative for arthralgias and myalgias.  Neurological: Negative for dizziness, tremors, light-headedness, numbness and headaches.  Psychiatric/Behavioral: Negative for sleep disturbance. The patient is not nervous/anxious.    Physical Exam   Blood pressure 122/76, pulse 94, temperature 98.3 F (36.8 C), temperature source Oral, resp. rate 18, height 5\' 8"  (1.727 m), weight 61.9 kg, last menstrual period 08/23/2017, SpO2 99 %, unknown if currently breastfeeding.  Physical Exam  Nursing note and vitals reviewed. Constitutional: She is oriented to person, place, and time. She appears well-developed and well-nourished. No distress.  Sitting on bed playing on phone  HENT:  Head: Normocephalic and atraumatic.  Eyes: Conjunctivae and EOM are normal. No scleral icterus.  Cardiovascular: Intact distal pulses.  Tachycardic  brisk cap refill   Respiratory: Effort  normal and breath sounds normal. She has no wheezes.  GI: There is no tenderness. There is no guarding.  gravid  Genitourinary:  Genitourinary Comments: deferred  Musculoskeletal: She exhibits no edema.  Neurological: She is alert and oriented to person, place, and time.  Skin: Skin is warm and dry. No rash noted.  Psychiatric: She has a normal mood and affect. Her behavior is normal.   MAU Course  Procedures  MDM -- here for similar complaints, will check CBC/CMP, IV bolus and zofran  -- repleted K+, Zofran for nausea  -- consulted with Dr. Jolayne Panther regarding plan of care - okay for dispo home since no vomiting here, IVF given, will discharge home with plan for phenergan, zofran prn, and B6 nightly  also will order growth U/S to be scheduled within the next week  -- scopolamine patch ordered but left without being applied -- no observed vomiting episodes while in MAU, able to tolerate po fluids   Assessment and Plan  22yo G2P0010 at [redacted]w[redacted]d who presents with recurrent nausea, vomiting. Never observed vomiting in MAU. Given IV fluids and able to tolerate po after Zofran. Continues to have poor weight gain with this pregnancy. Discharged home on regimen of phenergan 12.5mg  scheduled every 4-6 hours  zofran prn  B6 qHS. Ordered growth U/S at discharge given poor weight gain and on lower limit of normal for fundal height in recent visits. Reviewed return precautions. Patient voiced understanding of plan.   Tamera Stands, DO  02/17/2018, 11:30 AM

## 2018-02-15 NOTE — Discharge Instructions (Signed)
·   STOP other nausea medications  START new regimen as directed: phenergan 12.5mg  every 6 hours with zofran dissolvable tablets as needed AND vitamin B6 nightly  Try to have fluids throughout the day and protein shakes to help with weight gain  You will receive a call to schedule an ultrasound to measure baby's growth

## 2018-02-18 ENCOUNTER — Encounter (HOSPITAL_COMMUNITY): Payer: Self-pay | Admitting: *Deleted

## 2018-02-18 ENCOUNTER — Other Ambulatory Visit: Payer: Self-pay

## 2018-02-18 ENCOUNTER — Inpatient Hospital Stay (HOSPITAL_COMMUNITY)
Admission: AD | Admit: 2018-02-18 | Discharge: 2018-02-18 | Disposition: A | Discharge status: Home / Self Care | Payer: Medicaid Other | Source: Ambulatory Visit | Attending: Obstetrics and Gynecology | Admitting: Obstetrics and Gynecology

## 2018-02-18 DIAGNOSIS — Z79899 Other long term (current) drug therapy: Secondary | ICD-10-CM | POA: Insufficient documentation

## 2018-02-18 DIAGNOSIS — Z3689 Encounter for other specified antenatal screening: Secondary | ICD-10-CM | POA: Diagnosis not present

## 2018-02-18 DIAGNOSIS — Z8249 Family history of ischemic heart disease and other diseases of the circulatory system: Secondary | ICD-10-CM | POA: Insufficient documentation

## 2018-02-18 DIAGNOSIS — R824 Acetonuria: Secondary | ICD-10-CM

## 2018-02-18 DIAGNOSIS — O210 Mild hyperemesis gravidarum: Secondary | ICD-10-CM

## 2018-02-18 DIAGNOSIS — Z3A30 30 weeks gestation of pregnancy: Secondary | ICD-10-CM | POA: Diagnosis not present

## 2018-02-18 DIAGNOSIS — O212 Late vomiting of pregnancy: Secondary | ICD-10-CM | POA: Diagnosis present

## 2018-02-18 LAB — URINALYSIS, ROUTINE W REFLEX MICROSCOPIC
BILIRUBIN URINE: NEGATIVE
GLUCOSE, UA: NEGATIVE mg/dL
KETONES UR: 80 mg/dL — AB
LEUKOCYTES UA: NEGATIVE
NITRITE: NEGATIVE
PH: 6 (ref 5.0–8.0)
Protein, ur: 30 mg/dL — AB
SPECIFIC GRAVITY, URINE: 1.021 (ref 1.005–1.030)

## 2018-02-18 MED ORDER — PROMETHAZINE HCL 25 MG/ML IJ SOLN
25.0000 mg | Freq: Four times a day (QID) | INTRAMUSCULAR | Status: DC | PRN
  Administered 2018-02-18: 25 mg via INTRAVENOUS
  Filled 2018-02-18: qty 1

## 2018-02-18 MED ORDER — M.V.I. ADULT IV INJ
Freq: Once | INTRAVENOUS | Status: AC
  Administered 2018-02-18: 20:00:00 via INTRAVENOUS
  Filled 2018-02-18: qty 10

## 2018-02-18 MED ORDER — DEXTROSE IN LACTATED RINGERS 5 % IV SOLN
Freq: Once | INTRAVENOUS | Status: AC
  Administered 2018-02-18: 18:00:00 via INTRAVENOUS

## 2018-02-18 NOTE — Discharge Instructions (Signed)
Eating Plan for Hyperemesis Gravidarum °Hyperemesis gravidarum is a severe form of morning sickness. Because this condition causes severe nausea and vomiting, it can lead to dehydration, malnutrition, and weight loss. One way to lessen the symptoms of nausea and vomiting is to follow the eating plan for hyperemesis gravidarum. It is often used along with prescribed medicines to control your symptoms. °What can I do to relieve my symptoms? °Listen to your body. Everyone is different and has different preferences. Find what works best for you. Take any of the following actions that are helpful to you: °· Eat and drink slowly. °· Eat 5-6 small meals daily instead of 3 large meals. °· Eat crackers before you get out of bed in the morning. °· Try having a snack in the middle of the night. °· Starchy foods are usually tolerated well. Examples include cereal, toast, bread, potatoes, pasta, rice, and pretzels. °· Ginger may help with nausea. Add ¼ tsp ground ginger to hot tea or choose ginger tea. °· Try drinking 100% fruit juice or an electrolyte drink. An electrolyte drink contains sodium, potassium, and chloride. °· Continue to take your prenatal vitamins as told by your health care provider. If you are having trouble taking your prenatal vitamins, talk with your health care provider about different options. °· Include at least 1 serving of protein with your meals and snacks. Protein options include meats or poultry, beans, nuts, eggs, and yogurt. Try eating a protein-rich snack before bed. Examples of these snacks include cheese and crackers or half of a peanut butter or turkey sandwich. °· Consider eliminating foods that trigger your symptoms. These may include spicy foods, coffee, high-fat foods, very sweet foods, and acidic foods. °· Try meals that have more protein combined with bland, salty, lower-fat, and dry foods, such as nuts, seeds, pretzels, crackers, and cereal. °· Talk with your healthcare provider about  starting a supplement of vitamin B6. °· Have fluids that are cold, clear, and carbonated or sour. Examples include lemonade, ginger ale, lemon-lime soda, ice water, and sparkling water. °· Try lemon or mint tea. °· Try brushing your teeth or using a mouth rinse after meals. ° °What should I avoid to reduce my symptoms? °Avoiding some of the following things may help reduce your symptoms. °· Foods with strong smells. Try eating meals in well-ventilated areas that are free of odors. °· Drinking water or other beverages with meals. Try not to drink anything during the 30 minutes before and after your meals. °· Drinking more than 1 cup of fluid at a time. Sometimes using a straw helps. °· Fried or high-fat foods, such as butter and cream sauces. °· Spicy foods. °· Skipping meals as best as you can. Nausea can be more intense on an empty stomach. If you cannot tolerate food at that time, do not force it. Try sucking on ice chips or other frozen items, and make up for missed calories later. °· Lying down within 2 hours after eating. °· Environmental triggers. These may include smoky rooms, closed spaces, rooms with strong smells, warm or humid places, overly loud and noisy rooms, and rooms with motion or flickering lights. °· Quick and sudden changes in your movement. ° °This information is not intended to replace advice given to you by your health care provider. Make sure you discuss any questions you have with your health care provider. °Document Released: 02/12/2007 Document Revised: 12/15/2015 Document Reviewed: 11/16/2015 °Elsevier Interactive Patient Education © 2018 [employer] ° °

## 2018-02-18 NOTE — MAU Note (Signed)
Ongoing n/v, can't keep anything down.

## 2018-02-18 NOTE — MAU Provider Note (Signed)
History     CSN: [id number]  Arrival date and time: 02/18/18 5697   First Provider Initiated Contact with Patient 02/18/18 1721      Chief Complaint  Patient presents with  . Emesis   HPI  Deborah Evans is a 22 y.o. G2P0010 at 5w6dwho presents to MAU with chief complaint of recurrent nausea and vomiting. Patient is unable to estimate her number of episodes of vomiting today, but her grandmother is present and estimates 5-6 today. Patient states she has been tolerating microwave cups of mac and cheese along with Snapple fruit juice since previous MAU visit. Nausea/vomiting did not restart before this morning. Patient endorses use of Phenergan at 1pm today.  Patient is s/p five hospital admissions for treatment of hyperemesis.    Denies vaginal bleeding, leaking of fluid, decreased fetal movement, fever, falls, or recent illness.    OB History    Gravida  2   Para      Term      Preterm      AB  1   Living  0     SAB  1   TAB      Ectopic      Multiple      Live Births              Past Medical History:  Diagnosis Date  . Hyperemesis gravidarum     Past Surgical History:  Procedure Laterality Date  . DILATION AND CURETTAGE OF UTERUS N/A 07/15/2014   Procedure: SUCTION DILATATION AND CURETTAGE;  Surgeon: LFlorian Buff MD;  Location: [facility];  Service: Gynecology;  Laterality: N/A;    Family History  Problem Relation Age of Onset  . Hypertension Maternal Grandmother   . ADD / ADHD Neg Hx   . Alcohol abuse Neg Hx   . Anxiety disorder Neg Hx   . Arthritis Neg Hx   . Birth defects Neg Hx   . Asthma Neg Hx   . Cancer Neg Hx   . COPD Neg Hx   . Depression Neg Hx   . Diabetes Neg Hx   . Drug abuse Neg Hx   . Early death Neg Hx   . Hearing loss Neg Hx   . Heart disease Neg Hx   . Hyperlipidemia Neg Hx   . Intellectual disability Neg Hx   . Kidney disease Neg Hx   . Learning disabilities Neg Hx   . Miscarriages / Stillbirths Neg Hx   .  Obesity Neg Hx   . Stroke Neg Hx   . Vision loss Neg Hx   . Varicose Veins Neg Hx     Social History   Tobacco Use  . Smoking status: Never Smoker  . Smokeless tobacco: Never Used  Substance Use Topics  . Alcohol use: No  . Drug use: No    Allergies: No Known Allergies  Medications Prior to Admission  Medication Sig Dispense Refill Last Dose  . ondansetron (ZOFRAN-ODT) 4 MG disintegrating tablet Take 1 tablet (4 mg total) by mouth every 8 (eight) hours as needed for nausea or vomiting. 30 tablet 0   . Prenatal MV-Min-FA-Omega-3 (PRENATAL GUMMIES/DHA & FA PO) Take 2 tablets by mouth daily.    02/09/2018  . promethazine (PHENERGAN) 12.5 MG tablet Take 1 tablet (12.5 mg total) by mouth every 6 (six) hours. 60 tablet 0   . pyridOXINE (VITAMIN B-6) 50 MG tablet Take 1 tablet (50 mg total) by mouth at bedtime. [id number]  tablet 0     Review of Systems  Constitutional: Negative for chills, fatigue and fever.  Respiratory: Negative for shortness of breath.   Gastrointestinal: Positive for nausea and vomiting. Negative for abdominal pain.  Genitourinary: Negative for difficulty urinating, dyspareunia, dysuria, flank pain, vaginal bleeding, vaginal discharge and vaginal pain.   Physical Exam   Blood pressure (!) 113/59, pulse 89, temperature 98.4 F (36.9 C), resp. rate 16, weight 63.4 kg, last menstrual period 08/23/2017, SpO2 99 %, unknown if currently breastfeeding.  Physical Exam  Nursing note and vitals reviewed. Constitutional: She is oriented to person, place, and time. She appears well-developed and well-nourished.  Cardiovascular: Normal rate, normal heart sounds and intact distal pulses.  Respiratory: Effort normal. No respiratory distress. She has no wheezes.  GI: She exhibits no distension. There is no tenderness. There is no rebound, no guarding and no CVA tenderness.  Gravid  Neurological: She is alert and oriented to person, place, and time. She has normal reflexes.  Skin:  Skin is warm and dry.  Psychiatric: She has a normal mood and affect. Her behavior is normal. Judgment and thought content normal.    MAU Course  Procedures  MDM --Reactive fetal tracing: baseline 145, moderate variability, positive accelerations, no decelerations --Toco: rare contractions, uterine irritability --Weight gain since MAU visit 10/18: 61.9kg to 63.4kg --Emesis x 1 in MAU, tolerating crackers after IV Phenergan  Patient Vitals for the past 24 hrs:  BP Temp Pulse Resp SpO2 Weight  02/18/18 2111 120/66 - 90 15 - -  02/18/18 1652 (!) 113/59 98.4 F (36.9 C) 89 16 99 % 63.4 kg    Results for orders placed or performed during the hospital encounter of 02/18/18 (from the past 24 hour(s))  Urinalysis, Routine w reflex microscopic     Status: Abnormal   Collection Time: 02/18/18  5:06 PM  Result Value Ref Range   Color, Urine AMBER (A) YELLOW   APPearance HAZY (A) CLEAR   Specific Gravity, Urine 1.021 1.005 - 1.030   pH 6.0 5.0 - 8.0   Glucose, UA NEGATIVE NEGATIVE mg/dL   Hgb urine dipstick SMALL (A) NEGATIVE   Bilirubin Urine NEGATIVE NEGATIVE   Ketones, ur 80 (A) NEGATIVE mg/dL   Protein, ur 30 (A) NEGATIVE mg/dL   Nitrite NEGATIVE NEGATIVE   Leukocytes, UA NEGATIVE NEGATIVE   RBC / HPF 0-5 0 - 5 RBC/hpf   WBC, UA 6-10 0 - 5 WBC/hpf   Bacteria, UA RARE (A) NONE SEEN   Squamous Epithelial / LPF 0-5 0 - 5   Mucus PRESENT    Meds ordered this encounter  Medications  . dextrose 5 % in lactated ringers infusion  . multivitamins adult (MVI -12) 10 mL in lactated ringers 1,000 mL infusion  . promethazine (PHENERGAN) injection 25 mg   Assessment and Plan  --22 y.o. G2P0010 at [redacted]w[redacted]d --Reactive fetal tracing --Weight gain since MAU visit 10/18: 61.9kg to 63.4kg --Vomiting responsive to medications in MAU, patient has existing rx for same meds --Patient to revise food choices for bland, solid foods and keep PO hydration bland as well --Discharge home in stable  condition  F/U: OB at [facility] 02/21/18  [place]arlina Rumpf [place]/21/2019, 9:25 PM

## 2018-02-21 ENCOUNTER — Ambulatory Visit (INDEPENDENT_AMBULATORY_CARE_PROVIDER_SITE_OTHER): Payer: BLUE CROSS/BLUE SHIELD | Admitting: Advanced Practice Midwife

## 2018-02-21 VITALS — BP 114/75 | HR 89 | Wt 141.0 lb

## 2018-02-21 DIAGNOSIS — Z1389 Encounter for screening for other disorder: Secondary | ICD-10-CM

## 2018-02-21 DIAGNOSIS — Z3483 Encounter for supervision of other normal pregnancy, third trimester: Secondary | ICD-10-CM

## 2018-02-21 DIAGNOSIS — O211 Hyperemesis gravidarum with metabolic disturbance: Secondary | ICD-10-CM

## 2018-02-21 DIAGNOSIS — Z3A31 31 weeks gestation of pregnancy: Secondary | ICD-10-CM

## 2018-02-21 DIAGNOSIS — O26843 Uterine size-date discrepancy, third trimester: Secondary | ICD-10-CM

## 2018-02-21 DIAGNOSIS — Z331 Pregnant state, incidental: Secondary | ICD-10-CM

## 2018-02-21 LAB — POCT URINALYSIS DIPSTICK OB
Blood, UA: NEGATIVE
GLUCOSE, UA: NEGATIVE
Ketones, UA: NEGATIVE
POC,PROTEIN,UA: NEGATIVE
Urobilinogen, UA: NEGATIVE E.U./dL — AB

## 2018-02-21 NOTE — Patient Instructions (Signed)

## 2018-02-21 NOTE — Progress Notes (Signed)
LOW-RISK PREGNANCY VISIT Patient name: Deborah Evans MRN [id number]  Date of birth: [birth year] Chief Complaint:   Routine Prenatal Visit  History of Present Illness:   Deborah Evans is a 22 y.o. G91P0010 female at [redacted]w[redacted]d with an Estimated Date of Delivery: 04/23/18 being seen today for ongoing management of a low-risk pregnancy. She has been to the [facility] several times over the past week or so for N/V  Weight has remained stable, never had to be admitted Today she reports feeling good today. ZOfran works best, has plenty at home. Contractions: Not present. Vag. Bleeding: None.  Movement: Present. denies leaking of fluid. Review of Systems:   Pertinent items are noted in HPI Denies abnormal vaginal discharge w/ itching/odor/irritation, headaches, visual changes, shortness of breath, chest pain,or problems with urination or bowel movements unless otherwise stated above.  Pertinent History Reviewed:  Medical & Surgical Hx:   Past Medical History:  Diagnosis Date  . Hyperemesis gravidarum    Past Surgical History:  Procedure Laterality Date  . DILATION AND CURETTAGE OF UTERUS N/A 07/15/2014   Procedure: SUCTION DILATATION AND CURETTAGE;  Surgeon: Lazaro Arms, MD;  Location: [facility];  Service: Gynecology;  Laterality: N/A;   Family History  Problem Relation Age of Onset  . Hypertension Maternal Grandmother   . ADD / ADHD Neg Hx   . Alcohol abuse Neg Hx   . Anxiety disorder Neg Hx   . Arthritis Neg Hx   . Birth defects Neg Hx   . Asthma Neg Hx   . Cancer Neg Hx   . COPD Neg Hx   . Depression Neg Hx   . Diabetes Neg Hx   . Drug abuse Neg Hx   . Early death Neg Hx   . Hearing loss Neg Hx   . Heart disease Neg Hx   . Hyperlipidemia Neg Hx   . Intellectual disability Neg Hx   . Kidney disease Neg Hx   . Learning disabilities Neg Hx   . Miscarriages / Stillbirths Neg Hx   . Obesity Neg Hx   . Stroke Neg Hx   . Vision loss Neg Hx   . Varicose Veins Neg Hx     Current  Outpatient Medications:  .  ondansetron (ZOFRAN-ODT) 4 MG disintegrating tablet, Take 1 tablet (4 mg total) by mouth every 8 (eight) hours as needed for nausea or vomiting., Disp: 30 tablet, Rfl: 0 .  Prenatal MV-Min-FA-Omega-3 (PRENATAL GUMMIES/DHA & FA PO), Take 2 tablets by mouth daily. , Disp: , Rfl:  .  promethazine (PHENERGAN) 12.5 MG tablet, Take 1 tablet (12.5 mg total) by mouth every 6 (six) hours., Disp: 60 tablet, Rfl: 0 .  pyridOXINE (VITAMIN B-6) 50 MG tablet, Take 1 tablet (50 mg total) by mouth at bedtime., Disp: 30 tablet, Rfl: 0 Social History: Reviewed -  reports that she has never smoked. She has never used smokeless tobacco.  Physical Assessment:   Vitals:   02/21/18 0901  BP: 114/75  Pulse: 89  Weight: 141 lb (64 kg)  Body mass index is 21.44 kg/m.        Physical Examination:   General appearance: Well appearing, and in no distress  Mental status: Alert, oriented to person, place, and time  Skin: Warm & dry  Cardiovascular: Normal heart rate noted  Respiratory: Normal respiratory effort, no distress  Abdomen: Soft, gravid, nontender  Pelvic: Cervical exam deferred         Extremities: Edema: None  Fetal  Status: Fetal Heart Rate (bpm): 138 Fundal Height: 28 cm Movement: Present    Results for orders placed or performed in visit on 02/21/18 (from the past 24 hour(s))  POC Urinalysis Dipstick OB   Collection Time: 02/21/18  9:04 AM  Result Value Ref Range   Color, UA     Clarity, UA     Glucose, UA Negative Negative   Bilirubin, UA     Ketones, UA neg    Spec Grav, UA     Blood, UA neg    pH, UA     POC Protein UA Negative Negative, Trace   Urobilinogen, UA negative (A) 0.2 or 1.0 E.U./dL   Nitrite, UA     Leukocytes, UA Trace (A) Negative   Appearance     Odor      Assessment & Plan:  1) Low-risk pregnancy G2P0010 at [redacted]w[redacted]d with an Estimated Date of Delivery: 04/23/18   2) hyperemesis, good today     Plan:  Continue routine obstetrical care  plan EFW 32 and 36 weeks d/t HG/size<dates   Follow-up: Return for next week for EFW [place] only / 2 weeks for LROB.  Orders Placed This Encounter  Procedures  . US OB Follow Up  . POC Urinalysis Dipstick OB   Jacklyn Shell CNM 02/21/2018 9:52 AM

## 2018-02-27 ENCOUNTER — Ambulatory Visit (INDEPENDENT_AMBULATORY_CARE_PROVIDER_SITE_OTHER): Payer: BLUE CROSS/BLUE SHIELD

## 2018-02-27 DIAGNOSIS — O211 Hyperemesis gravidarum with metabolic disturbance: Secondary | ICD-10-CM

## 2018-02-27 DIAGNOSIS — Z3403 Encounter for supervision of normal first pregnancy, third trimester: Secondary | ICD-10-CM

## 2018-02-27 DIAGNOSIS — O26843 Uterine size-date discrepancy, third trimester: Secondary | ICD-10-CM | POA: Diagnosis not present

## 2018-02-27 NOTE — Progress Notes (Signed)
[place] 32+1 wks,cephalic,cx 2.7 cm,posterior placenta gr 0,normal ovaries bilat,LVEICF,AFI 14 cm,EFW 1854 g 31%

## 2018-03-07 ENCOUNTER — Other Ambulatory Visit: Payer: Self-pay

## 2018-03-07 ENCOUNTER — Encounter: Payer: Self-pay | Admitting: Advanced Practice Midwife

## 2018-03-07 ENCOUNTER — Ambulatory Visit (INDEPENDENT_AMBULATORY_CARE_PROVIDER_SITE_OTHER): Payer: BLUE CROSS/BLUE SHIELD | Admitting: Advanced Practice Midwife

## 2018-03-07 ENCOUNTER — Other Ambulatory Visit: Payer: Self-pay | Admitting: Family Medicine

## 2018-03-07 VITALS — BP 116/74 | HR 91 | Wt 142.0 lb

## 2018-03-07 DIAGNOSIS — Z3483 Encounter for supervision of other normal pregnancy, third trimester: Secondary | ICD-10-CM

## 2018-03-07 DIAGNOSIS — Z3A33 33 weeks gestation of pregnancy: Secondary | ICD-10-CM

## 2018-03-07 DIAGNOSIS — Z1389 Encounter for screening for other disorder: Secondary | ICD-10-CM

## 2018-03-07 DIAGNOSIS — Z331 Pregnant state, incidental: Secondary | ICD-10-CM

## 2018-03-07 LAB — POCT URINALYSIS DIPSTICK OB
Blood, UA: NEGATIVE
GLUCOSE, UA: NEGATIVE
Ketones, UA: NEGATIVE
Leukocytes, UA: NEGATIVE
Nitrite, UA: NEGATIVE
POC,PROTEIN,UA: NEGATIVE

## 2018-03-07 NOTE — Progress Notes (Signed)
  G2P0010 [redacted]w[redacted]d Estimated Date of Delivery: 04/23/18  Blood pressure 116/74, pulse 91, weight 142 lb (64.4 kg), last menstrual period 08/23/2017, unknown if currently breastfeeding.   BP weight and urine results all reviewed and noted.  Please refer to the obstetrical flow sheet for the fundal height and fetal heart rate documentation:  Patient reports good fetal movement, denies any bleeding and no rupture of membranes symptoms or regular contractions. Patient is without complaints. All questions were answered.   Physical Assessment:   Vitals:   03/07/18 0902  BP: 116/74  Pulse: 91  Weight: 142 lb (64.4 kg)  Body mass index is 21.59 kg/m.        Physical Examination:   General appearance: Well appearing, and in no distress  Mental status: Alert, oriented to person, place, and time  Skin: Warm & dry  Cardiovascular: Normal heart rate noted  Respiratory: Normal respiratory effort, no distress  Abdomen: Soft, gravid, nontender  Pelvic: Cervical exam deferred         Extremities: Edema: None  Fetal Status: Fetal Heart Rate (bpm): 142 Fundal Height: 30 cm Movement: Present    Results for orders placed or performed in visit on 03/07/18 (from the past 24 hour(s))  POC Urinalysis Dipstick OB   Collection Time: 03/07/18  9:02 AM  Result Value Ref Range   Color, UA     Clarity, UA     Glucose, UA Negative Negative   Bilirubin, UA     Ketones, UA neg    Spec Grav, UA     Blood, UA neg    pH, UA     POC,PROTEIN,UA Negative Negative, Trace   Urobilinogen, UA     Nitrite, UA neg    Leukocytes, UA Negative Negative   Appearance     Odor       Orders Placed This Encounter  Procedures  . POC Urinalysis Dipstick OB    Plan:  Continued routine obstetrical care,   Return in about 2 weeks (around 03/21/2018) for LROB.

## 2018-03-07 NOTE — Patient Instructions (Signed)

## 2018-03-21 ENCOUNTER — Encounter: Payer: Self-pay | Admitting: Advanced Practice Midwife

## 2018-03-21 ENCOUNTER — Other Ambulatory Visit: Payer: Self-pay

## 2018-03-21 ENCOUNTER — Ambulatory Visit (INDEPENDENT_AMBULATORY_CARE_PROVIDER_SITE_OTHER): Payer: BLUE CROSS/BLUE SHIELD | Admitting: Advanced Practice Midwife

## 2018-03-21 VITALS — BP 119/73 | HR 91 | Wt 142.0 lb

## 2018-03-21 DIAGNOSIS — Z3483 Encounter for supervision of other normal pregnancy, third trimester: Secondary | ICD-10-CM

## 2018-03-21 DIAGNOSIS — Z1389 Encounter for screening for other disorder: Secondary | ICD-10-CM

## 2018-03-21 DIAGNOSIS — Z3A35 35 weeks gestation of pregnancy: Secondary | ICD-10-CM

## 2018-03-21 DIAGNOSIS — Z331 Pregnant state, incidental: Secondary | ICD-10-CM

## 2018-03-21 LAB — POCT URINALYSIS DIPSTICK OB
Blood, UA: NEGATIVE
GLUCOSE, UA: NEGATIVE
KETONES UA: NEGATIVE
LEUKOCYTES UA: NEGATIVE
NITRITE UA: NEGATIVE
PROTEIN: NEGATIVE

## 2018-03-21 NOTE — Patient Instructions (Signed)
[place] Pediatricians/Family Doctors:  [facility] [phone]            [facility] [phone]                 [facility] [phone] (usually not accepting new patients unless you have family there already, you are always welcome to call and ask)       [facility] [phone]       [place] Pediatricians/Family Doctors:   [facility]: [phone]  [facility]: [phone]  [facility]: [phone]  [place] [facility]:   [facility]: [phone]   [facility]: [phone]  [place] [facility]:  [facility]: [phone]   

## 2018-03-21 NOTE — Progress Notes (Signed)
  G2P0010 8045w2d Estimated Date of Delivery: 04/23/18  Blood pressure 119/73, pulse 91, weight 142 lb (64.4 kg), last menstrual period 08/23/2017, unknown if currently breastfeeding.   BP weight and urine results all reviewed and noted.  Please refer to the obstetrical flow sheet for the fundal height and fetal heart rate documentation:  Patient reports good fetal movement, denies any bleeding and no rupture of membranes symptoms or regular contractions. Patient is without complaints. All questions were answered.   Physical Assessment:   Vitals:   03/21/18 0929  BP: 119/73  Pulse: 91  Weight: 142 lb (64.4 kg)  Body mass index is 21.59 kg/m.        Physical Examination:   General appearance: Well appearing, and in no distress  Mental status: Alert, oriented to person, place, and time  Skin: Warm & dry  Cardiovascular: Normal heart rate noted  Respiratory: Normal respiratory effort, no distress  Abdomen: Soft, gravid, nontender  Pelvic: Cervical exam deferred         Extremities: Edema: None  Fetal Status:     Movement: Present    Results for orders placed or performed in visit on 03/21/18 (from the past 24 hour(s))  POC Urinalysis Dipstick OB   Collection Time: 03/21/18  9:29 AM  Result Value Ref Range   Color, UA     Clarity, UA     Glucose, UA Negative Negative   Bilirubin, UA     Ketones, UA neg    Spec Grav, UA     Blood, UA neg    pH, UA     POC,PROTEIN,UA Negative Negative, Trace, Small (1+), Moderate (2+), Large (3+), 4+   Urobilinogen, UA     Nitrite, UA neg    Leukocytes, UA Negative Negative   Appearance     Odor       Orders Placed This Encounter  Procedures  . POC Urinalysis Dipstick OB    Plan:  Continued routine obstetrical care,   Return in about 1 week (around 03/28/2018) for LROB.

## 2018-03-27 ENCOUNTER — Encounter: Payer: Self-pay | Admitting: Obstetrics and Gynecology

## 2018-03-27 ENCOUNTER — Ambulatory Visit (INDEPENDENT_AMBULATORY_CARE_PROVIDER_SITE_OTHER): Payer: BLUE CROSS/BLUE SHIELD | Admitting: Obstetrics and Gynecology

## 2018-03-27 VITALS — BP 118/84 | HR 101 | Wt 143.4 lb

## 2018-03-27 DIAGNOSIS — Z3A36 36 weeks gestation of pregnancy: Secondary | ICD-10-CM

## 2018-03-27 DIAGNOSIS — Z1389 Encounter for screening for other disorder: Secondary | ICD-10-CM

## 2018-03-27 DIAGNOSIS — Z3483 Encounter for supervision of other normal pregnancy, third trimester: Secondary | ICD-10-CM

## 2018-03-27 DIAGNOSIS — O26843 Uterine size-date discrepancy, third trimester: Secondary | ICD-10-CM

## 2018-03-27 DIAGNOSIS — Z331 Pregnant state, incidental: Secondary | ICD-10-CM

## 2018-03-27 LAB — POCT URINALYSIS DIPSTICK OB
Blood, UA: NEGATIVE
Glucose, UA: NEGATIVE
Nitrite, UA: NEGATIVE
POC,PROTEIN,UA: NEGATIVE

## 2018-03-27 NOTE — Addendum Note (Signed)
Addended by: Tilda BurrowFERGUSON, Ikey Omary V on: 03/27/2018 09:07 AM   Modules accepted: Orders

## 2018-03-27 NOTE — Patient Instructions (Addendum)
Third Trimester of Pregnancy The third trimester is from week 28 through week 40 (months 7 through 9). The third trimester is a time when the unborn baby (fetus) is growing rapidly. At the end of the ninth month, the fetus is about 20 inches in length and weighs 6-10 pounds. Body changes during your third trimester Your body will continue to go through many changes during pregnancy. The changes vary from woman to woman. During the third trimester:  Your weight will continue to increase. You can expect to gain 25-35 pounds (11-16 kg) by the end of the pregnancy.  You may begin to get stretch marks on your hips, abdomen, and breasts.  You may urinate more often because the fetus is moving lower into your pelvis and pressing on your bladder.  You may develop or continue to have heartburn. This is caused by increased hormones that slow down muscles in the digestive tract.  You may develop or continue to have constipation because increased hormones slow digestion and cause the muscles that push waste through your intestines to relax.  You may develop hemorrhoids. These are swollen veins (varicose veins) in the rectum that can itch or be painful.  You may develop swollen, bulging veins (varicose veins) in your legs.  You may have increased body aches in the pelvis, back, or thighs. This is due to weight gain and increased hormones that are relaxing your joints.  You may have changes in your hair. These can include thickening of your hair, rapid growth, and changes in texture. Some women also have hair loss during or after pregnancy, or hair that feels dry or thin. Your hair will most likely return to normal after your baby is born.  Your breasts will continue to grow and they will continue to become tender. A yellow fluid (colostrum) may leak from your breasts. This is the first milk you are producing for your baby.  Your belly button may stick out.  You may notice more swelling in your hands,  face, or ankles.  You may have increased tingling or numbness in your hands, arms, and legs. The skin on your belly may also feel numb.  You may feel short of breath because of your expanding uterus.  You may have more problems sleeping. This can be caused by the size of your belly, increased need to urinate, and an increase in your body's metabolism.  You may notice the fetus "dropping," or moving lower in your abdomen (lightening).  You may have increased vaginal discharge.  You may notice your joints feel loose and you may have pain around your pelvic bone.  What to expect at prenatal visits You will have prenatal exams every 2 weeks until week 36. Then you will have weekly prenatal exams. During a routine prenatal visit:  You will be weighed to make sure you and the baby are growing normally.  Your blood pressure will be taken.  Your abdomen will be measured to track your baby's growth.  The fetal heartbeat will be listened to.  Any test results from the previous visit will be discussed.  You may have a cervical check near your due date to see if your cervix has softened or thinned (effaced).  You will be tested for Group B streptococcus. This happens between 35 and 37 weeks.  Your health care provider may ask you:  What your birth plan is.  How you are feeling.  If you are feeling the baby move.  If you have had   any abnormal symptoms, such as leaking fluid, bleeding, severe headaches, or abdominal cramping.  If you are using any tobacco products, including cigarettes, chewing tobacco, and electronic cigarettes.  If you have any questions.  Other tests or screenings that may be performed during your third trimester include:  Blood tests that check for low iron levels (anemia).  Fetal testing to check the health, activity level, and growth of the fetus. Testing is done if you have certain medical conditions or if there are problems during the  pregnancy.  Nonstress test (NST). This test checks the health of your baby to make sure there are no signs of problems, such as the baby not getting enough oxygen. During this test, a belt is placed around your belly. The baby is made to move, and its heart rate is monitored during movement.  What is false labor? False labor is a condition in which you feel small, irregular tightenings of the muscles in the womb (contractions) that usually go away with rest, changing position, or drinking water. These are called Braxton Hicks contractions. Contractions may last for hours, days, or even weeks before true labor sets in. If contractions come at regular intervals, become more frequent, increase in intensity, or become painful, you should see your health care provider. What are the signs of labor?  Abdominal cramps.  Regular contractions that start at 10 minutes apart and become stronger and more frequent with time.  Contractions that start on the top of the uterus and spread down to the lower abdomen and back.  Increased pelvic pressure and dull back pain.  A watery or bloody mucus discharge that comes from the vagina.  Leaking of amniotic fluid. This is also known as your "water breaking." It could be a slow trickle or a gush. Let your health care provider know if it has a color or strange odor. If you have any of these signs, call your health care provider right away, even if it is before your due date. Follow these instructions at home: Medicines  Follow your health care provider's instructions regarding medicine use. Specific medicines may be either safe or unsafe to take during pregnancy.  Take a prenatal vitamin that contains at least 600 micrograms (mcg) of folic acid.  If you develop constipation, try taking a stool softener if your health care provider approves. Eating and drinking  Eat a balanced diet that includes fresh fruits and vegetables, whole grains, good sources of protein  such as meat, eggs, or tofu, and low-fat dairy. Your health care provider will help you determine the amount of weight gain that is right for you.  Avoid raw meat and uncooked cheese. These carry germs that can cause birth defects in the baby.  If you have low calcium intake from food, talk to your health care provider about whether you should take a daily calcium supplement.  Eat four or five small meals rather than three large meals a day.  Limit foods that are high in fat and processed sugars, such as fried and sweet foods.  To prevent constipation: ? Drink enough fluid to keep your urine clear or pale yellow. ? Eat foods that are high in fiber, such as fresh fruits and vegetables, whole grains, and beans. Activity  Exercise only as directed by your health care provider. Most women can continue their usual exercise routine during pregnancy. Try to exercise for 30 minutes at least 5 days a week. Stop exercising if you experience uterine contractions.  Avoid heavy   lifting.  Do not exercise in extreme heat or humidity, or at high altitudes.  Wear low-heel, comfortable shoes.  Practice good posture.  You may continue to have sex unless your health care provider tells you otherwise. Relieving pain and discomfort  Take frequent breaks and rest with your legs elevated if you have leg cramps or low back pain.  Take warm sitz baths to soothe any pain or discomfort caused by hemorrhoids. Use hemorrhoid cream if your health care provider approves.  Wear a good support bra to prevent discomfort from breast tenderness.  If you develop varicose veins: ? Wear support pantyhose or compression stockings as told by your healthcare provider. ? Elevate your feet for 15 minutes, 3-4 times a day. Prenatal care  Write down your questions. Take them to your prenatal visits.  Keep all your prenatal visits as told by your health care provider. This is important. Safety  Wear your seat belt at  all times when driving.  Make a list of emergency phone numbers, including numbers for family, friends, the hospital, and police and fire departments. General instructions  Avoid cat litter boxes and soil used by cats. These carry germs that can cause birth defects in the baby. If you have a cat, ask someone to clean the litter box for you.  Do not travel far distances unless it is absolutely necessary and only with the approval of your health care provider.  Do not use hot tubs, steam rooms, or saunas.  Do not drink alcohol.  Do not use any products that contain nicotine or tobacco, such as cigarettes and e-cigarettes. If you need help quitting, ask your health care provider.  Do not use any medicinal herbs or unprescribed drugs. These chemicals affect the formation and growth of the baby.  Do not douche or use tampons or scented sanitary pads.  Do not cross your legs for long periods of time.  To prepare for the arrival of your baby: ? Take prenatal classes to understand, practice, and ask questions about labor and delivery. ? Make a trial run to the hospital. ? Visit the hospital and tour the maternity area. ? Arrange for maternity or paternity leave through employers. ? Arrange for family and friends to take care of pets while you are in the hospital. ? Purchase a rear-facing car seat and make sure you know how to install it in your car. ? Pack your hospital bag. ? Prepare the baby's nursery. Make sure to remove all pillows and stuffed animals from the baby's crib to prevent suffocation.  Visit your dentist if you have not gone during your pregnancy. Use a soft toothbrush to brush your teeth and be gentle when you floss. Contact a health care provider if:  You are unsure if you are in labor or if your water has broken.  You become dizzy.  You have mild pelvic cramps, pelvic pressure, or nagging pain in your abdominal area.  You have lower back pain.  You have persistent  nausea, vomiting, or diarrhea.  You have an unusual or bad smelling vaginal discharge.  You have pain when you urinate. Get help right away if:  Your water breaks before 37 weeks.  You have regular contractions less than 5 minutes apart before 37 weeks.  You have a fever.  You are leaking fluid from your vagina.  You have spotting or bleeding from your vagina.  You have severe abdominal pain or cramping.  You have rapid weight loss or weight gain.    You have shortness of breath with chest pain.  You notice sudden or extreme swelling of your face, hands, ankles, feet, or legs.  Your baby makes fewer than 10 movements in 2 hours.  You have severe headaches that do not go away when you take medicine.  You have vision changes. Summary  The third trimester is from week 28 through week 40, months 7 through 9. The third trimester is a time when the unborn baby (fetus) is growing rapidly. During the third trimester, your discomfort may increase as you and your ba     Preeclampsia and Eclampsia Preeclampsia is a serious condition that develops only during pregnancy. It is also called toxemia of pregnancy. This condition causes high blood pressure along with other symptoms, such as swelling and headaches. These symptoms may develop as the condition gets worse. Preeclampsia may occur at 20 weeks of pregnancy or later. Diagnosing and treating preeclampsia early is very important. If not treated early, it can cause serious problems for you and your baby. One problem it can lead to is eclampsia, which is a condition that causes muscle jerking or shaking (convulsions or seizures) in the mother. Delivering your baby is the best treatment for preeclampsia or eclampsia. Preeclampsia and eclampsia symptoms usually go away after your baby is born. What are the causes? The cause of preeclampsia is not known. What increases the risk? The following risk factors make you more likely to develop  preeclampsia: Being pregnant for the first time. Having had preeclampsia during a past pregnancy. Having a family history of preeclampsia. Having high blood pressure. Being pregnant with twins or triplets. Being 71 or older. Being African-American. Having kidney disease or diabetes. Having medical conditions such as lupus or blood diseases. Being very overweight (obese).  What are the signs or symptoms? The earliest signs of preeclampsia are: High blood pressure. Increased protein in your urine. Your health care provider will check for this at every visit before you give birth (prenatal visit).  Other symptoms that may develop as the condition gets worse include: Severe headaches. Sudden weight gain. Swelling of the hands, face, legs, and feet. Nausea and vomiting. Vision problems, such as blurred or double vision. Numbness in the face, arms, legs, and feet. Urinating less than usual. Dizziness. Slurred speech. Abdominal pain, especially upper abdominal pain. Convulsions or seizures.  Symptoms generally go away after giving birth. How is this diagnosed? There are no screening tests for preeclampsia. Your health care provider will ask you about symptoms and check for signs of preeclampsia during your prenatal visits. You may also have tests that include: Urine tests. Blood tests. Checking your blood pressure. Monitoring your baby's heart rate. Ultrasound.  How is this treated? You and your health care provider will determine the treatment approach that is best for you. Treatment may include: Having more frequent prenatal exams to check for signs of preeclampsia, if you have an increased risk for preeclampsia. Bed rest. Reducing how much salt (sodium) you eat. Medicine to lower your blood pressure. Staying in the hospital, if your condition is severe. There, treatment will focus on controlling your blood pressure and the amount of fluids in your body (fluid retention). You  may need to take medicine (magnesium sulfate) to prevent seizures. This medicine may be given as an injection or through an IV tube. Delivering your baby early, if your condition gets worse. You may have your labor started with medicine (induced), or you may have a cesarean delivery.  Follow these instructions  at home: Eating and drinking  Drink enough fluid to keep your urine clear or pale yellow. Eat a healthy diet that is low in sodium. Do not add salt to your food. Check nutrition labels to see how much sodium a food or beverage contains. Avoid caffeine. Lifestyle Do not use any products that contain nicotine or tobacco, such as cigarettes and e-cigarettes. If you need help quitting, ask your health care provider. Do not use alcohol or drugs. Avoid stress as much as possible. Rest and get plenty of sleep. General instructions Take over-the-counter and prescription medicines only as told by your health care provider. When lying down, lie on your side. This keeps pressure off of your baby. When sitting or lying down, raise (elevate) your feet. Try putting some pillows underneath your lower legs. Exercise regularly. Ask your health care provider what kinds of exercise are best for you. Keep all follow-up and prenatal visits as told by your health care provider. This is important. How is this prevented? To prevent preeclampsia or eclampsia from developing during another pregnancy: Get proper medical care during pregnancy. Your health care provider may be able to prevent preeclampsia or diagnose and treat it early. Your health care provider may have you take a low-dose aspirin or a calcium supplement during your next pregnancy. You may have tests of your blood pressure and kidney function after giving birth. Maintain a healthy weight. Ask your health care provider for help managing weight gain during pregnancy. Work with your health care provider to manage any long-term (chronic) health  conditions you have, such as diabetes or kidney problems.  Contact a health care provider if: You gain more weight than expected. You have headaches. You have nausea or vomiting. You have abdominal pain. You feel dizzy or light-headed. Get help right away if: You develop sudden or severe swelling anywhere in your body. This usually happens in the legs. You gain 5 lbs (2.3 kg) or more during one week. You have severe: Abdominal pain. Headaches. Dizziness. Vision problems. Confusion. Nausea or vomiting. You have a seizure. You have trouble moving any part of your body. You develop numbness in any part of your body. You have trouble speaking. You have any abnormal bleeding. You pass out. This information is not intended to replace advice given to you by your health care provider. Make sure you discuss any questions you have with your health care provider. Document Released: 04/14/2000 Document Revised: 12/14/2015 Document Reviewed: 11/22/2015 Elsevier Interactive Patient Education  [employer]2018 [employer]  by continue to gain weight. You may have abdominal, leg, and back pain, sleeping problems, and an increased need to urinate.  During the third trimester your breasts will keep growing and they will continue to become tender. A yellow fluid (colostrum) may leak from your breasts. This is the first milk you are producing for your baby.  False labor is a condition in which you feel small, irregular tightenings of the muscles in the womb (contractions) that eventually go away. These are called Braxton Hicks contractions. Contractions may last for hours, days, or even weeks before true labor sets in.  Signs of labor can include: abdominal cramps; regular contractions that start at 10 minutes apart and become stronger and more frequent with time; watery or bloody mucus discharge that comes from the vagina; increased pelvic pressure and dull back pain; and leaking of amniotic fluid. This  information is not intended to replace advice given to you by your health care provider. Make sure you  discuss any questions you have with your health care provider. Document Released: 04/11/2001 Document Revised: 09/23/2015 Document Reviewed: 06/18/2012 Elsevier Interactive Patient Education  2017 [employer].

## 2018-03-27 NOTE — Progress Notes (Signed)
   LOW-RISK PREGNANCY VISIT Patient name: Deborah Evans MRN [id number]  Date of birth: 1995/07/17 Chief Complaint:   Routine Prenatal Visit  History of Present Illness:   Deborah Evans is a 22 y.o. 82P0010 female at 4274w1d with an Estimated Date of Delivery: 04/23/18 being seen today for ongoing management of a low-risk pregnancy.  Today she reports no complaints. Contractions: Not present. Vag. Bleeding: None.  Movement: Present.  Fetal movement good Initial blood pressure here today mildly elevated, recheck within normal limits no headaches blurry vision Review of Systems:   Pertinent items are noted in HPI Denies abnormal vaginal discharge w/ itching/odor/irritation, headaches, visual changes, shortness of breath, chest pain, abdominal pain, severe nausea/vomiting, or problems with urination or bowel movements unless otherwise stated above. Pertinent History Reviewed:  Reviewed past medical,surgical, social, obstetrical and family history.  Reviewed problem list, medications and allergies. Physical Assessment:   Vitals:   03/27/18 0837 03/27/18 0840  BP: (!) 142/93 118/84  Pulse: 91 (!) 101  Weight: 143 lb 6.4 oz (65 kg)   Body mass index is 21.8 kg/m.        Physical Examination:   General appearance: Well appearing, and in no distress  Mental status: Alert, oriented to person, place, and time  Skin: Warm & dry  Cardiovascular: Normal heart rate noted  Respiratory: Normal respiratory effort, no distress  Abdomen: Soft, gravid, nontender  Pelvic: Cervical exam deferred         Extremities: Edema: None  Fetal Status:     Movement: Present    Results for orders placed or performed in visit on 03/27/18 (from the past 24 hour(s))  POC Urinalysis Dipstick OB   Collection Time: 03/27/18  8:38 AM  Result Value Ref Range   Color, UA     Clarity, UA     Glucose, UA Negative Negative   Bilirubin, UA     Ketones, UA trace    Spec Grav, UA     Blood, UA neg    pH, UA     POC,PROTEIN,UA Negative Negative, Trace, Small (1+), Moderate (2+), Large (3+), 4+   Urobilinogen, UA     Nitrite, UA neg    Leukocytes, UA Trace (A) Negative   Appearance     Odor      Assessment & Plan:  1) Low-risk pregnancy G2P0010 at 5174w1d with an Estimated Date of Delivery: 04/23/18 size less than dates, 31 percentile growth at 32 weeks  2) history of hyperemesis this pregnancy, now very mild,  Still using Phenergan and Zofran Meds: No orders of the defined types were placed in this encounter.  Labs/procedures today: None  Plan:  Continue routine obstetrical care ultrasound for growth next week     Reviewed: Term labor symptoms and general obstetric precautions including but not limited to vaginal bleeding, contractions, leaking of fluid and fetal movement were reviewed in detail with the patient.  All questions were answered  Follow-up: 1 week with ultrasound for growth group B strep GC and chlamydia repeat  Orders Placed This Encounter  Procedures  . POC Urinalysis Dipstick OB   Tilda BurrowJohn V Pawel Soules CNM, [facility] CenterWHNP-BC 03/27/2018 8:42 AM

## 2018-04-03 ENCOUNTER — Ambulatory Visit (INDEPENDENT_AMBULATORY_CARE_PROVIDER_SITE_OTHER): Payer: BLUE CROSS/BLUE SHIELD

## 2018-04-03 DIAGNOSIS — O26843 Uterine size-date discrepancy, third trimester: Secondary | ICD-10-CM

## 2018-04-03 DIAGNOSIS — Z3403 Encounter for supervision of normal first pregnancy, third trimester: Secondary | ICD-10-CM

## 2018-04-03 NOTE — Progress Notes (Addendum)
US 37+1 wks,cephalic,fhr 146 bpm,normal ovaries bilat,posterior placenta gr 1,afi 8 cm,EFW 2796 g 26%,limited measurements because of fetal position

## 2018-04-04 ENCOUNTER — Encounter: Payer: Self-pay | Admitting: Advanced Practice Midwife

## 2018-04-04 ENCOUNTER — Ambulatory Visit (INDEPENDENT_AMBULATORY_CARE_PROVIDER_SITE_OTHER): Payer: BLUE CROSS/BLUE SHIELD | Admitting: Advanced Practice Midwife

## 2018-04-04 VITALS — BP 109/70 | HR 98 | Wt 140.0 lb

## 2018-04-04 DIAGNOSIS — Z331 Pregnant state, incidental: Secondary | ICD-10-CM

## 2018-04-04 DIAGNOSIS — Z3A37 37 weeks gestation of pregnancy: Secondary | ICD-10-CM

## 2018-04-04 DIAGNOSIS — Z3483 Encounter for supervision of other normal pregnancy, third trimester: Secondary | ICD-10-CM

## 2018-04-04 DIAGNOSIS — Z1389 Encounter for screening for other disorder: Secondary | ICD-10-CM

## 2018-04-04 LAB — POCT URINALYSIS DIPSTICK OB
Blood, UA: NEGATIVE
GLUCOSE, UA: NEGATIVE
Nitrite, UA: NEGATIVE
POC,PROTEIN,UA: NEGATIVE

## 2018-04-04 LAB — OB RESULTS CONSOLE GC/CHLAMYDIA: Gonorrhea: NEGATIVE

## 2018-04-04 NOTE — Patient Instructions (Signed)
Cervical Ripening: May try one or both  Red Raspberry Leaf capsules:  two 300mg or 400mg tablets with each meal, 2-3 times a day  Potential Side Effects Of Raspberry Leaf:  Most women do not experience any side effects from drinking raspberry leaf tea. However, nausea and loose stools are possible   Evening Primrose Oil capsules: may take 1 to 3 capsules daily. May also prick one to release the oil and insert it into your vagina at night.  Some of the potential side effects:  Upset stomach  Loose stools or diarrhea  Headaches  Nausea:   

## 2018-04-04 NOTE — Progress Notes (Signed)
  G2P0010 10923w2d Estimated Date of Delivery: 04/23/18  Blood pressure 109/70, pulse 98, weight 140 lb (63.5 kg), last menstrual period 08/23/2017, unknown if currently breastfeeding.   BP weight and urine results all reviewed and noted.  Please refer to the obstetrical flow sheet for the fundal height and fetal heart rate documentation:  EFW 26% yesterday  Patient reports good fetal movement, denies any bleeding and no rupture of membranes symptoms or regular contractions. Patient is without complaints. All questions were answered.   Physical Assessment:   Vitals:   04/04/18 1200  BP: 109/70  Pulse: 98  Weight: 140 lb (63.5 kg)  Body mass index is 21.29 kg/m.        Physical Examination:   General appearance: Well appearing, and in no distress  Mental status: Alert, oriented to person, place, and time  Skin: Warm & dry  Cardiovascular: Normal heart rate noted  Respiratory: Normal respiratory effort, no distress  Abdomen: Soft, gravid, nontender  Pelvic: Cervical exam performed  Dilation: Closed Effacement (%): Thick Station: -1  Extremities: Edema: None  Fetal Status: Fetal Heart Rate (bpm): 150 Fundal Height: 32 cm Movement: Present Presentation: Vertex  Results for orders placed or performed in visit on 04/04/18 (from the past 24 hour(s))  POC Urinalysis Dipstick OB   Collection Time: 04/04/18 12:01 PM  Result Value Ref Range   Color, UA     Clarity, UA     Glucose, UA Negative Negative   Bilirubin, UA     Ketones, UA 4+    Spec Grav, UA     Blood, UA neg    pH, UA     POC,PROTEIN,UA Negative Negative, Trace, Small (1+), Moderate (2+), Large (3+), 4+   Urobilinogen, UA     Nitrite, UA neg    Leukocytes, UA Trace (A) Negative   Appearance     Odor       Orders Placed This Encounter  Procedures  . GC/Chlamydia Probe Amp  . Strep Gp B NAA  . POC Urinalysis Dipstick OB    Plan:  Continued routine obstetrical care, cx ripening info given  Return in about 1  week (around 04/11/2018) for LROB.

## 2018-04-06 LAB — GC/CHLAMYDIA PROBE AMP
Chlamydia trachomatis, NAA: NEGATIVE
NEISSERIA GONORRHOEAE BY PCR: NEGATIVE

## 2018-04-06 LAB — STREP GP B NAA: Strep Gp B NAA: NEGATIVE

## 2018-04-09 ENCOUNTER — Inpatient Hospital Stay (HOSPITAL_COMMUNITY)
Admission: AD | Admit: 2018-04-09 | Discharge: 2018-04-09 | Disposition: A | Discharge status: Home / Self Care | Payer: Medicaid Other | Attending: Obstetrics & Gynecology | Admitting: Obstetrics & Gynecology

## 2018-04-09 ENCOUNTER — Encounter (HOSPITAL_COMMUNITY): Payer: Self-pay | Admitting: *Deleted

## 2018-04-09 DIAGNOSIS — E860 Dehydration: Secondary | ICD-10-CM | POA: Insufficient documentation

## 2018-04-09 DIAGNOSIS — O212 Late vomiting of pregnancy: Secondary | ICD-10-CM | POA: Insufficient documentation

## 2018-04-09 DIAGNOSIS — O211 Hyperemesis gravidarum with metabolic disturbance: Secondary | ICD-10-CM

## 2018-04-09 DIAGNOSIS — Z3A38 38 weeks gestation of pregnancy: Secondary | ICD-10-CM | POA: Diagnosis not present

## 2018-04-09 LAB — URINALYSIS, ROUTINE W REFLEX MICROSCOPIC
Bilirubin Urine: NEGATIVE
Glucose, UA: NEGATIVE mg/dL
HGB URINE DIPSTICK: NEGATIVE
Ketones, ur: 80 mg/dL — AB
NITRITE: NEGATIVE
Protein, ur: 30 mg/dL — AB
SPECIFIC GRAVITY, URINE: 1.02 (ref 1.005–1.030)
pH: 6 (ref 5.0–8.0)

## 2018-04-09 MED ORDER — DEXTROSE 5 % IN LACTATED RINGERS IV BOLUS
1000.0000 mL | Freq: Once | INTRAVENOUS | Status: AC
  Administered 2018-04-09: 1000 mL via INTRAVENOUS

## 2018-04-09 MED ORDER — FAMOTIDINE IN NACL 20-0.9 MG/50ML-% IV SOLN
20.0000 mg | Freq: Once | INTRAVENOUS | Status: AC
  Administered 2018-04-09: 20 mg via INTRAVENOUS
  Filled 2018-04-09: qty 50

## 2018-04-09 MED ORDER — SODIUM CHLORIDE 0.9 % IV SOLN
8.0000 mg | Freq: Once | INTRAVENOUS | Status: AC
  Administered 2018-04-09: 8 mg via INTRAVENOUS
  Filled 2018-04-09: qty 4

## 2018-04-09 MED ORDER — M.V.I. ADULT IV INJ
Freq: Once | INTRAVENOUS | Status: AC
  Administered 2018-04-09: 17:00:00 via INTRAVENOUS
  Filled 2018-04-09: qty 1000

## 2018-04-09 NOTE — MAU Provider Note (Signed)
Chief Complaint:  Emesis   First Provider Initiated Contact with Patient 04/09/18 1720     HPI: Deborah Evans is a 22 y.o. G2P0010 at 2626w0d who presents to maternity admissions reporting nausea & vomiting. Has HEG with this pregnancy. States she normally vomits once or twice every morning. Taking phenergan & zofran on a schedule. Has vomited 6 times today. States last episode of increased vomiting was a week ago. Denies abdominal pain, diarrhea, fever, LOF, or vaginal bleeding. Normal fetal movement.    Past Medical History:  Diagnosis Date  . Hyperemesis gravidarum    OB History  Gravida Para Term Preterm AB Living  2       1 0  SAB TAB Ectopic Multiple Live Births  1            # Outcome Date GA Lbr Len/2nd Weight Sex Delivery Anes PTL Lv  2 Current           1 SAB 06/2014 5056w0d          Past Surgical History:  Procedure Laterality Date  . DILATION AND CURETTAGE OF UTERUS N/A 07/15/2014   Procedure: SUCTION DILATATION AND CURETTAGE;  Surgeon: Lazaro ArmsLuther H Eure, MD;  Location: [facility];  Service: Gynecology;  Laterality: N/A;   Family History  Problem Relation Age of Onset  . Hypertension Maternal Grandmother   . ADD / ADHD Neg Hx   . Alcohol abuse Neg Hx   . Anxiety disorder Neg Hx   . Arthritis Neg Hx   . Birth defects Neg Hx   . Asthma Neg Hx   . Cancer Neg Hx   . COPD Neg Hx   . Depression Neg Hx   . Diabetes Neg Hx   . Drug abuse Neg Hx   . Early death Neg Hx   . Hearing loss Neg Hx   . Heart disease Neg Hx   . Hyperlipidemia Neg Hx   . Intellectual disability Neg Hx   . Kidney disease Neg Hx   . Learning disabilities Neg Hx   . Miscarriages / Stillbirths Neg Hx   . Obesity Neg Hx   . Stroke Neg Hx   . Vision loss Neg Hx   . Varicose Veins Neg Hx    Social History   Tobacco Use  . Smoking status: Never Smoker  . Smokeless tobacco: Never Used  Substance Use Topics  . Alcohol use: No  . Drug use: No   No Known Allergies Medications Prior to Admission   Medication Sig Dispense Refill Last Dose  . ondansetron (ZOFRAN-ODT) 4 MG disintegrating tablet Take 1 tablet (4 mg total) by mouth every 8 (eight) hours as needed for nausea or vomiting. 30 tablet 0 04/09/2018 at Unknown time  . Prenatal MV-Min-FA-Omega-3 (PRENATAL GUMMIES/DHA & FA PO) Take 2 tablets by mouth daily.    04/09/2018 at Unknown time  . promethazine (PHENERGAN) 12.5 MG tablet TAKE 1 TABLET (12.5 MG TOTAL) BY MOUTH EVERY 6 (SIX) HOURS. 60 tablet 0 04/09/2018 at Unknown time  . pyridOXINE (VITAMIN B-6) 50 MG tablet TAKE 1 TABLET BY MOUTH EVERYDAY AT BEDTIME 30 tablet 0 04/08/2018 at Unknown time    I have reviewed patient's Past Medical Hx, Surgical Hx, Family Hx, Social Hx, medications and allergies.   ROS:  Review of Systems  Constitutional: Negative.   Gastrointestinal: Positive for nausea and vomiting. Negative for abdominal pain, constipation and diarrhea.  Genitourinary: Negative.     Physical Exam   Patient Vitals  for the past 24 hrs:  BP Temp Temp src Pulse Resp Weight  04/09/18 1921 125/83 - - (!) 102 16 -  04/09/18 1612 108/66 98.1 F (36.7 C) Oral 92 16 64 kg    Constitutional: Well-developed, well-nourished female in no acute distress.  Cardiovascular: normal rate & rhythm, no murmur Respiratory: normal effort, lung sounds clear throughout GI: Abd soft, non-tender, gravid appropriate for gestational age. Pos BS x 4 MS: Extremities nontender, no edema, normal ROM Neurologic: Alert and oriented x 4.     Dilation: Closed Effacement (%): Thick Cervical Position: Posterior Station: -1 Presentation: Vertex Exam by:: ginger morris rn  NST:  Baseline: 130 bpm, Variability: Good {> 6 bpm), Accelerations: Reactive and Decelerations: Absent   Labs: Results for orders placed or performed during the hospital encounter of 04/09/18 (from the past 24 hour(s))  Urinalysis, Routine w reflex microscopic     Status: Abnormal   Collection Time: 04/09/18  4:26 PM   Result Value Ref Range   Color, Urine AMBER (A) YELLOW   APPearance HAZY (A) CLEAR   Specific Gravity, Urine 1.020 1.005 - 1.030   pH 6.0 5.0 - 8.0   Glucose, UA NEGATIVE NEGATIVE mg/dL   Hgb urine dipstick NEGATIVE NEGATIVE   Bilirubin Urine NEGATIVE NEGATIVE   Ketones, ur 80 (A) NEGATIVE mg/dL   Protein, ur 30 (A) NEGATIVE mg/dL   Nitrite NEGATIVE NEGATIVE   Leukocytes, UA TRACE (A) NEGATIVE   RBC / HPF 0-5 0 - 5 RBC/hpf   WBC, UA 0-5 0 - 5 WBC/hpf   Bacteria, UA FEW (A) NONE SEEN   Squamous Epithelial / LPF 0-5 0 - 5   Mucus PRESENT     Imaging:  No results found.  MAU Course: Orders Placed This Encounter  Procedures  . Urinalysis, Routine w reflex microscopic  . Discharge patient   Meds ordered this encounter  Medications  . lactated ringers 1,000 mL with multivitamins adult (INFUVITE ADULT) 10 mL infusion  . ondansetron (ZOFRAN) 8 mg in sodium chloride 0.9 % 50 mL IVPB  . famotidine (PEPCID) IVPB 20 mg premix  . dextrose 5% lactated ringers bolus 1,000 mL    MDM: Pt given 2 bags of IV fluids (D5LR & MVI in LR), pepcid and zofran. No further vomiting in MAU. Reactive NST. Will discharge home for patient to continues meds as prescribed.   Assessment: 1. Hyperemesis gravidarum with dehydration   2. [redacted] weeks gestation of pregnancy     Plan: Discharge home in stable condition.  Labor precautions and fetal kick counts   Allergies as of 04/09/2018   No Known Allergies     Medication List    TAKE these medications   ondansetron 4 MG disintegrating tablet Commonly known as:  ZOFRAN-ODT Take 1 tablet (4 mg total) by mouth every 8 (eight) hours as needed for nausea or vomiting.   PRENATAL GUMMIES/DHA & FA PO Take 2 tablets by mouth daily.   promethazine 12.5 MG tablet Commonly known as:  PHENERGAN TAKE 1 TABLET (12.5 MG TOTAL) BY MOUTH EVERY 6 (SIX) HOURS.   pyridOXINE 50 MG tablet Commonly known as:  VITAMIN B-6 TAKE 1 TABLET BY MOUTH EVERYDAY AT  BEDTIME       Judeth Horn, NP 04/09/2018 7:40 PM

## 2018-04-09 NOTE — Discharge Instructions (Signed)
Eating Plan for Hyperemesis Gravidarum Hyperemesis gravidarum is a severe form of morning sickness. Because this condition causes severe nausea and vomiting, it can lead to dehydration, malnutrition, and weight loss. One way to lessen the symptoms of nausea and vomiting is to follow the eating plan for hyperemesis gravidarum. It is often used along with prescribed medicines to control your symptoms. What can I do to relieve my symptoms? Listen to your body. Everyone is different and has different preferences. Find what works best for you. Take any of the following actions that are helpful to you:  Eat and drink slowly.  Eat 5-6 small meals daily instead of 3 large meals.  Eat crackers before you get out of bed in the morning.  Try having a snack in the middle of the night.  Starchy foods are usually tolerated well. Examples include cereal, toast, bread, potatoes, pasta, rice, and pretzels.  Ginger may help with nausea. Add  tsp ground ginger to hot tea or choose ginger tea.  Try drinking 100% fruit juice or an electrolyte drink. An electrolyte drink contains sodium, potassium, and chloride.  Continue to take your prenatal vitamins as told by your health care provider. If you are having trouble taking your prenatal vitamins, talk with your health care provider about different options.  Include at least 1 serving of protein with your meals and snacks. Protein options include meats or poultry, beans, nuts, eggs, and yogurt. Try eating a protein-rich snack before bed. Examples of these snacks include cheese and crackers or half of a peanut butter or [place] sandwich.  Consider eliminating foods that trigger your symptoms. These may include spicy foods, coffee, high-fat foods, very sweet foods, and acidic foods.  Try meals that have more protein combined with bland, salty, lower-fat, and dry foods, such as nuts, seeds, pretzels, crackers, and cereal.  Talk with your healthcare provider about  starting a supplement of vitamin B6.  Have fluids that are cold, clear, and carbonated or sour. Examples include lemonade, ginger ale, lemon-lime soda, ice water, and sparkling water.  Try lemon or mint tea.  Try brushing your teeth or using a mouth rinse after meals.  What should I avoid to reduce my symptoms? Avoiding some of the following things may help reduce your symptoms.  Foods with strong smells. Try eating meals in well-ventilated areas that are free of odors.  Drinking water or other beverages with meals. Try not to drink anything during the 30 minutes before and after your meals.  Drinking more than 1 cup of fluid at a time. Sometimes using a straw helps.  Fried or high-fat foods, such as butter and cream sauces.  Spicy foods.  Skipping meals as best as you can. Nausea can be more intense on an empty stomach. If you cannot tolerate food at that time, do not force it. Try sucking on ice chips or other frozen items, and make up for missed calories later.  Lying down within 2 hours after eating.  Environmental triggers. These may include smoky rooms, closed spaces, rooms with strong smells, warm or humid places, overly loud and noisy rooms, and rooms with motion or flickering lights.  Quick and sudden changes in your movement.  This information is not intended to replace advice given to you by your health care provider. Make sure you discuss any questions you have with your health care provider. Document Released: 02/12/2007 Document Revised: 12/15/2015 Document Reviewed: 11/16/2015 Elsevier Interactive Patient Education  2018 [employer]  Fetal Movement Counts  Patient Name: ________________________________________________ Patient Due Date: ____________________ What is a fetal movement count? A fetal movement count is the number of times that you feel your baby move during a certain amount of time. This may also be called a fetal kick count. A fetal movement count is  recommended for every pregnant woman. You may be asked to start counting fetal movements as early as week 28 of your pregnancy. Pay attention to when your baby is most active. You may notice your baby's sleep and wake cycles. You may also notice things that make your baby move more. You should do a fetal movement count:  When your baby is normally most active.  At the same time each day.  A good time to count movements is while you are resting, after having something to eat and drink. How do I count fetal movements? 1. Find a quiet, comfortable area. Sit, or lie down on your side. 2. Write down the date, the start time and stop time, and the number of movements that you felt between those two times. Take this information with you to your health care visits. 3. For 2 hours, count kicks, flutters, swishes, rolls, and jabs. You should feel at least 10 movements during 2 hours. 4. You may stop counting after you have felt 10 movements. 5. If you do not feel 10 movements in 2 hours, have something to eat and drink. Then, keep resting and counting for 1 hour. If you feel at least 4 movements during that hour, you may stop counting. Contact a health care provider if:  You feel fewer than 4 movements in 2 hours.  Your baby is not moving like he or she usually does. Date: ____________ Start time: ____________ Stop time: ____________ Movements: ____________ Date: ____________ Start time: ____________ Stop time: ____________ Movements: ____________ Date: ____________ Start time: ____________ Stop time: ____________ Movements: ____________ Date: ____________ Start time: ____________ Stop time: ____________ Movements: ____________ Date: ____________ Start time: ____________ Stop time: ____________ Movements: ____________ Date: ____________ Start time: ____________ Stop time: ____________ Movements: ____________ Date: ____________ Start time: ____________ Stop time: ____________ Movements:  ____________ Date: ____________ Start time: ____________ Stop time: ____________ Movements: ____________ Date: ____________ Start time: ____________ Stop time: ____________ Movements: ____________ This information is not intended to replace advice given to you by your health care provider. Make sure you discuss any questions you have with your health care provider. Document Released: 05/17/2006 Document Revised: 12/15/2015 Document Reviewed: 05/27/2015 Elsevier Interactive Patient Education  [employer].

## 2018-04-09 NOTE — MAU Note (Signed)
Pt C/O vomiting this morning, took her phenergan & zofran, has still been vomiting.  Denies diarrhea.  Denies contractions, bleeding or LOF.  Reports good fetal movement.

## 2018-04-11 ENCOUNTER — Ambulatory Visit (INDEPENDENT_AMBULATORY_CARE_PROVIDER_SITE_OTHER): Payer: BLUE CROSS/BLUE SHIELD | Admitting: Advanced Practice Midwife

## 2018-04-11 ENCOUNTER — Encounter (HOSPITAL_COMMUNITY): Payer: Self-pay | Admitting: *Deleted

## 2018-04-11 ENCOUNTER — Inpatient Hospital Stay (HOSPITAL_COMMUNITY)
Admission: AD | Admit: 2018-04-11 | Discharge: 2018-04-11 | Disposition: A | Discharge status: Home / Self Care | Payer: Medicaid Other | Source: Ambulatory Visit | Attending: Obstetrics & Gynecology | Admitting: Obstetrics & Gynecology

## 2018-04-11 VITALS — BP 125/81 | HR 91 | Wt 142.0 lb

## 2018-04-11 DIAGNOSIS — O212 Late vomiting of pregnancy: Secondary | ICD-10-CM | POA: Insufficient documentation

## 2018-04-11 DIAGNOSIS — O211 Hyperemesis gravidarum with metabolic disturbance: Secondary | ICD-10-CM

## 2018-04-11 DIAGNOSIS — Z3A38 38 weeks gestation of pregnancy: Secondary | ICD-10-CM

## 2018-04-11 DIAGNOSIS — Z3483 Encounter for supervision of other normal pregnancy, third trimester: Secondary | ICD-10-CM

## 2018-04-11 LAB — BASIC METABOLIC PANEL
Anion gap: 6 (ref 5–15)
BUN: <5 mg/dL — ABNORMAL LOW (ref 6–20)
CO2: 22 mmol/L (ref 22–32)
Calcium: 8.4 mg/dL — ABNORMAL LOW (ref 8.9–10.3)
Chloride: 105 mmol/L (ref 98–111)
Creatinine, Ser: 0.57 mg/dL (ref 0.44–1.00)
GFR calc Af Amer: >60 mL/min (ref >60)
GFR calc non Af Amer: >60 mL/min (ref >60)
Glucose, Bld: 74 mg/dL (ref 70–99)
Potassium: 3.4 mmol/L — ABNORMAL LOW (ref 3.5–5.1)
Sodium: 133 mmol/L — ABNORMAL LOW (ref 135–145)

## 2018-04-11 LAB — URINALYSIS, ROUTINE W REFLEX MICROSCOPIC
Bilirubin Urine: NEGATIVE
Glucose, UA: NEGATIVE mg/dL
Hgb urine dipstick: NEGATIVE
Ketones, ur: 80 mg/dL — AB
Nitrite: NEGATIVE
Protein, ur: NEGATIVE mg/dL
SPECIFIC GRAVITY, URINE: 1.013 (ref 1.005–1.030)
pH: 7 (ref 5.0–8.0)

## 2018-04-11 MED ORDER — LACTATED RINGERS IV BOLUS
1000.0000 mL | Freq: Once | INTRAVENOUS | Status: AC
  Administered 2018-04-11: 1000 mL via INTRAVENOUS

## 2018-04-11 MED ORDER — M.V.I. ADULT IV INJ
Freq: Once | INTRAVENOUS | Status: AC
  Administered 2018-04-11: 13:00:00 via INTRAVENOUS
  Filled 2018-04-11: qty 10

## 2018-04-11 MED ORDER — ONDANSETRON HCL 4 MG/2ML IJ SOLN
4.0000 mg | Freq: Once | INTRAMUSCULAR | Status: AC
  Administered 2018-04-11: 4 mg via INTRAVENOUS
  Filled 2018-04-11: qty 2

## 2018-04-11 MED ORDER — FAMOTIDINE IN NACL 20-0.9 MG/50ML-% IV SOLN
20.0000 mg | Freq: Once | INTRAVENOUS | Status: AC
  Administered 2018-04-11: 20 mg via INTRAVENOUS
  Filled 2018-04-11: qty 50

## 2018-04-11 NOTE — MAU Provider Note (Signed)
Chief Complaint:  Nausea and Emesis   First Provider Initiated Contact with Patient 04/11/18 1047     HPI: Deborah Evans is a [age] y.o. G2P0010 at 6773w2d who presents to maternity admissions from the office reporting nausea & vomiting. Being treated for HEG with this pregnancy. Last in MAU 2 days ago for IVF & meds. Takes phenergan & zofran on a schedule. Took phenergan this morning at 9 am. Reports vomiting 5 times today and countless times yesterday.  Last BM was this morning.  Denies contractions, leakage of fluid or vaginal bleeding. Good fetal movement.   Past Medical History:  Diagnosis Date  . Hyperemesis gravidarum    OB History  Gravida Para Term Preterm AB Living  2       1 0  SAB TAB Ectopic Multiple Live Births  1            # Outcome Date GA Lbr Len/2nd Weight Sex Delivery Anes PTL Lv  2 Current           1 SAB 06/2014 2440w0d          Past Surgical History:  Procedure Laterality Date  . DILATION AND CURETTAGE OF UTERUS N/A 07/15/2014   Procedure: SUCTION DILATATION AND CURETTAGE;  Surgeon: Lazaro ArmsLuther H Eure, MD;  Location: [facility];  Service: Gynecology;  Laterality: N/A;   Family History  Problem Relation Age of Onset  . Hypertension Maternal Grandmother   . ADD / ADHD Neg Hx   . Alcohol abuse Neg Hx   . Anxiety disorder Neg Hx   . Arthritis Neg Hx   . Birth defects Neg Hx   . Asthma Neg Hx   . Cancer Neg Hx   . COPD Neg Hx   . Depression Neg Hx   . Diabetes Neg Hx   . Drug abuse Neg Hx   . Early death Neg Hx   . Hearing loss Neg Hx   . Heart disease Neg Hx   . Hyperlipidemia Neg Hx   . Intellectual disability Neg Hx   . Kidney disease Neg Hx   . Learning disabilities Neg Hx   . Miscarriages / Stillbirths Neg Hx   . Obesity Neg Hx   . Stroke Neg Hx   . Vision loss Neg Hx   . Varicose Veins Neg Hx    Social History   Tobacco Use  . Smoking status: Never Smoker  . Smokeless tobacco: Never Used  Substance Use Topics  . Alcohol use: No  . Drug use: No    No Known Allergies No medications prior to admission.    I have reviewed patient's Past Medical Hx, Surgical Hx, Family Hx, Social Hx, medications and allergies.   ROS:  Review of Systems  Constitutional: Negative.   Gastrointestinal: Positive for nausea and vomiting. Negative for abdominal pain, constipation and diarrhea.  Genitourinary: Negative.     Physical Exam   Patient Vitals for the past 24 hrs:  BP Temp Temp src Pulse Resp SpO2 Height Weight  04/11/18 1413 128/80 - - 93 17 - - -  04/11/18 1025 119/75 98.3 F (36.8 C) Oral 99 15 97 % 5' 6.5" (1.689 m) 64.4 kg    Constitutional: Well-developed, well-nourished female in no acute distress.  Cardiovascular: normal rate & rhythm, no murmur Respiratory: normal effort, lung sounds clear throughout GI: Abd soft, non-tender, gravid appropriate for gestational age. Pos BS x 4 MS: Extremities nontender, no edema, normal ROM Neurologic: Alert and oriented x  4.   NST:  Baseline: 125 bpm, Variability: Good {> 6 bpm), Accelerations: Reactive and Decelerations: Absent   Labs: Results for orders placed or performed during the hospital encounter of 04/11/18 (from the past 24 hour(s))  Urinalysis, Routine w reflex microscopic     Status: Abnormal   Collection Time: 04/11/18 10:39 AM  Result Value Ref Range   Color, Urine YELLOW YELLOW   APPearance CLOUDY (A) CLEAR   Specific Gravity, Urine 1.013 1.005 - 1.030   pH 7.0 5.0 - 8.0   Glucose, UA NEGATIVE NEGATIVE mg/dL   Hgb urine dipstick NEGATIVE NEGATIVE   Bilirubin Urine NEGATIVE NEGATIVE   Ketones, ur 80 (A) NEGATIVE mg/dL   Protein, ur NEGATIVE NEGATIVE mg/dL   Nitrite NEGATIVE NEGATIVE   Leukocytes, UA TRACE (A) NEGATIVE   RBC / HPF 0-5 0 - 5 RBC/hpf   WBC, UA 6-10 0 - 5 WBC/hpf   Bacteria, UA RARE (A) NONE SEEN   Squamous Epithelial / LPF 0-5 0 - 5   Mucus PRESENT   Basic metabolic panel     Status: Abnormal   Collection Time: 04/11/18 11:22 AM  Result Value Ref  Range   Sodium 133 (L) 135 - 145 mmol/L   Potassium 3.4 (L) 3.5 - 5.1 mmol/L   Chloride 105 98 - 111 mmol/L   CO2 22 22 - 32 mmol/L   Glucose, Bld 74 70 - 99 mg/dL   BUN <5 (L) 6 - 20 mg/dL   Creatinine, Ser 1.61 0.44 - 1.00 mg/dL   Calcium 8.4 (L) 8.9 - 10.3 mg/dL   GFR calc non Af Amer >60 >60 mL/min   GFR calc Af Amer >60 >60 mL/min   Anion gap 6 5 - 15    Imaging:  No results found.  MAU Course: Orders Placed This Encounter  Procedures  . Urinalysis, Routine w reflex microscopic  . Basic metabolic panel  . Discharge patient   Meds ordered this encounter  Medications  . FOLLOWED BY Linked Order Group   . lactated ringers bolus 1,000 mL   . multivitamins adult (INFUVITE ADULT) 10 mL in dextrose 5% lactated ringers 1,000 mL infusion  . famotidine (PEPCID) IVPB 20 mg premix  . ondansetron (ZOFRAN) injection 4 mg    MDM: IVF 2 bags, LR followed by MVI in D5LR Zofran 4 mg & pepcid 20 mg Patient tolerating crackers & water, not observed vomiting while in MAU   Assessment: 1. Hyperemesis gravidarum with dehydration   2. [redacted] weeks gestation of pregnancy     Plan: Discharge home in stable condition.  Labor precautions and fetal kick counts   Allergies as of 04/11/2018   No Known Allergies     Medication List    TAKE these medications   ondansetron 4 MG disintegrating tablet Commonly known as:  ZOFRAN-ODT Take 1 tablet (4 mg total) by mouth every 8 (eight) hours as needed for nausea or vomiting.   PRENATAL GUMMIES/DHA & FA PO Take 2 tablets by mouth daily.   promethazine 12.5 MG tablet Commonly known as:  PHENERGAN TAKE 1 TABLET (12.5 MG TOTAL) BY MOUTH EVERY 6 (SIX) HOURS.   pyridOXINE 50 MG tablet Commonly known as:  VITAMIN B-6 TAKE 1 TABLET BY MOUTH EVERYDAY AT BEDTIME       Judeth Horn, NP 04/11/2018 2:36 PM

## 2018-04-11 NOTE — Progress Notes (Signed)
  G2P0010 5480w2d Estimated Date of Delivery: 04/23/18  Blood pressure 125/81, pulse 91, weight 142 lb (64.4 kg), last menstrual period 08/23/2017, unknown if currently breastfeeding.   BP weight and urine results all reviewed and noted.  Please refer to the obstetrical flow sheet for the fundal height and fetal heart rate documentation:  Patient reports good fetal movement, denies any bleeding and no rupture of membranes symptoms or regular contractions. Patient back to having severe N/V. Went to MAU 12/10 for IVF/meds.  Feels like she needs ot go again, still major vomiting, can't urinate.  Hasn't lost weight.  All questions were answered.   Physical Assessment:   Vitals:   04/11/18 0839  BP: 125/81  Pulse: 91  Weight: 142 lb (64.4 kg)  Body mass index is 21.59 kg/m.        Physical Examination:   General appearance: Well appearing, and in no distress  Mental status: Alert, oriented to person, place, and time  Skin: Warm & dry  Cardiovascular: Normal heart rate noted  Respiratory: Normal respiratory effort, no distress  Abdomen: Soft, gravid, nontender  Pelvic: Cervical exam deferred         Extremities: Edema: None  Fetal Status:     Movement: Present    No results found for this or any previous visit (from the past 24 hour(s)).   No orders of the defined types were placed in this encounter.   Plan:  Continued routine obstetrical care, to MAU for IVF/labs/meds  Pt wants elective IOL.  Instructed to try EPO and or RRL tea to ripen cx and we could try stripping membranes next week and possibly 40 week IOL if cx ripens.   No follow-ups on file.

## 2018-04-11 NOTE — MAU Note (Signed)
Pt reports vomiting for 2 days, here for ivf's 2 days ago. Denies pain.

## 2018-04-11 NOTE — Patient Instructions (Signed)
Cervical Ripening: May try one or both  Red Raspberry Leaf capsules:  two 300mg or 400mg tablets with each meal, 2-3 times a day  Potential Side Effects Of Raspberry Leaf:  Most women do not experience any side effects from drinking raspberry leaf tea. However, nausea and loose stools are possible   Evening Primrose Oil capsules: may take 1 to 3 capsules daily. May also prick one to release the oil and insert it into your vagina at night.  Some of the potential side effects:  Upset stomach  Loose stools or diarrhea  Headaches  Nausea:   

## 2018-04-11 NOTE — Discharge Instructions (Signed)
Eating Plan for Hyperemesis Gravidarum °Hyperemesis gravidarum is a severe form of morning sickness. Because this condition causes severe nausea and vomiting, it can lead to dehydration, malnutrition, and weight loss. One way to lessen the symptoms of nausea and vomiting is to follow the eating plan for hyperemesis gravidarum. It is often used along with prescribed medicines to control your symptoms. °What can I do to relieve my symptoms? °Listen to your body. Everyone is different and has different preferences. Find what works best for you. Take any of the following actions that are helpful to you: °· Eat and drink slowly. °· Eat 5-6 small meals daily instead of 3 large meals. °· Eat crackers before you get out of bed in the morning. °· Try having a snack in the middle of the night. °· Starchy foods are usually tolerated well. Examples include cereal, toast, bread, potatoes, pasta, rice, and pretzels. °· Ginger may help with nausea. Add ¼ tsp ground ginger to hot tea or choose ginger tea. °· Try drinking 100% fruit juice or an electrolyte drink. An electrolyte drink contains sodium, potassium, and chloride. °· Continue to take your prenatal vitamins as told by your health care provider. If you are having trouble taking your prenatal vitamins, talk with your health care provider about different options. °· Include at least 1 serving of protein with your meals and snacks. Protein options include meats or poultry, beans, nuts, eggs, and yogurt. Try eating a protein-rich snack before bed. Examples of these snacks include cheese and crackers or half of a peanut butter or turkey sandwich. °· Consider eliminating foods that trigger your symptoms. These may include spicy foods, coffee, high-fat foods, very sweet foods, and acidic foods. °· Try meals that have more protein combined with bland, salty, lower-fat, and dry foods, such as nuts, seeds, pretzels, crackers, and cereal. °· Talk with your healthcare provider about  starting a supplement of vitamin B6. °· Have fluids that are cold, clear, and carbonated or sour. Examples include lemonade, ginger ale, lemon-lime soda, ice water, and sparkling water. °· Try lemon or mint tea. °· Try brushing your teeth or using a mouth rinse after meals. ° °What should I avoid to reduce my symptoms? °Avoiding some of the following things may help reduce your symptoms. °· Foods with strong smells. Try eating meals in well-ventilated areas that are free of odors. °· Drinking water or other beverages with meals. Try not to drink anything during the 30 minutes before and after your meals. °· Drinking more than 1 cup of fluid at a time. Sometimes using a straw helps. °· Fried or high-fat foods, such as butter and cream sauces. °· Spicy foods. °· Skipping meals as best as you can. Nausea can be more intense on an empty stomach. If you cannot tolerate food at that time, do not force it. Try sucking on ice chips or other frozen items, and make up for missed calories later. °· Lying down within 2 hours after eating. °· Environmental triggers. These may include smoky rooms, closed spaces, rooms with strong smells, warm or humid places, overly loud and noisy rooms, and rooms with motion or flickering lights. °· Quick and sudden changes in your movement. ° °This information is not intended to replace advice given to you by your health care provider. Make sure you discuss any questions you have with your health care provider. °Document Released: 02/12/2007 Document Revised: 12/15/2015 Document Reviewed: 11/16/2015 °Elsevier Interactive Patient Education © 2018 [employer] ° °

## 2018-04-18 ENCOUNTER — Ambulatory Visit (INDEPENDENT_AMBULATORY_CARE_PROVIDER_SITE_OTHER): Payer: BLUE CROSS/BLUE SHIELD | Admitting: Obstetrics and Gynecology

## 2018-04-18 VITALS — BP 121/77 | HR 95 | Wt 146.4 lb

## 2018-04-18 DIAGNOSIS — Z3A39 39 weeks gestation of pregnancy: Secondary | ICD-10-CM | POA: Diagnosis not present

## 2018-04-18 DIAGNOSIS — Z3483 Encounter for supervision of other normal pregnancy, third trimester: Secondary | ICD-10-CM

## 2018-04-18 DIAGNOSIS — Z331 Pregnant state, incidental: Secondary | ICD-10-CM

## 2018-04-18 DIAGNOSIS — Z1389 Encounter for screening for other disorder: Secondary | ICD-10-CM

## 2018-04-18 LAB — POCT URINALYSIS DIPSTICK OB
Blood, UA: NEGATIVE
Glucose, UA: NEGATIVE
Leukocytes, UA: NEGATIVE
Nitrite, UA: NEGATIVE
POC,PROTEIN,UA: NEGATIVE

## 2018-04-18 NOTE — Progress Notes (Signed)
Patient ID: Deborah Evans, female   DOB: 18-May-1995, 22 y.o.   MRN: [id number]    LOW-RISK PREGNANCY VISIT Patient name: Deborah Evans MRN [id number]  Date of birth: 18-May-1995 Chief Complaint:   Routine Prenatal Visit  History of Present Illness:   Deborah Evans is a 22 y.o. 542P0010 female at 2398w2d with an Estimated Date of Delivery: 04/23/18 being seen today for ongoing management of a low-risk pregnancy. Hyperemesis no loner an issue. Has discontinued zofran and phenegan. Wants IUD after delivery. Today she reports no complaints. Contractions: Not present. Vag. Bleeding: None.  Movement: Present. denies leaking of fluid. Review of Systems:   Pertinent items are noted in HPI Denies abnormal vaginal discharge w/ itching/odor/irritation, headaches, visual changes, shortness of breath, chest pain, abdominal pain, severe nausea/vomiting, or problems with urination or bowel movements unless otherwise stated above. Pertinent History Reviewed:  Reviewed past medical,surgical, social, obstetrical and family history.  Reviewed problem list, medications and allergies. Physical Assessment:   Vitals:   04/18/18 1041  BP: 121/77  Pulse: 95  Weight: 146 lb 6.4 oz (66.4 kg)  Body mass index is 23.28 kg/m.        Physical Examination:   General appearance: Well appearing, and in no distress  Mental status: Alert, oriented to person, place, and time  Skin: Warm & dry  Cardiovascular: Normal heart rate noted  Respiratory: Normal respiratory effort, no distress  Abdomen: Soft, gravid, nontender  Pelvic: Cervical exam performed head low 0 station, closed 20% thinned        Extremities: Edema: None  Fetal Status: Fetal Heart Rate (bpm): 137 Fundal Height: 36 cm Movement: Present    Results for orders placed or performed in visit on 04/18/18 (from the past 24 hour(s))  POC Urinalysis Dipstick OB   Collection Time: 04/18/18 10:44 AM  Result Value Ref Range   Color, UA     Clarity, UA     Glucose, UA Negative Negative   Bilirubin, UA     Ketones, UA large    Spec Grav, UA     Blood, UA neg    pH, UA     POC,PROTEIN,UA Negative Negative, Trace, Small (1+), Moderate (2+), Large (3+), 4+   Urobilinogen, UA     Nitrite, UA neg    Leukocytes, UA Negative Negative   Appearance     Odor      Assessment & Plan:  1) Low-risk pregnancy G2P0010 at 6998w2d with an Estimated Date of Delivery: 04/23/18   2) Hyperemesis, reolved  Meds: No orders of the defined types were placed in this encounter.  Labs/procedures today: none  Plan:  F/u 04/26/18 w/ u/s  Reviewed: Term labor symptoms and general obstetric precautions including but not limited to vaginal bleeding, contractions, leaking of fluid and fetal movement were reviewed in detail with the patient.  All questions were answered  Follow-up: Return in about 8 days (around 04/26/2018) for LROB, U/s: anatomy.  Orders Placed This Encounter  Procedures  . POC Urinalysis Dipstick OB   By signing my name below, I, Arnette NorrisMari Johnson, attest that this documentation has been prepared under the direction and in the presence of Tilda BurrowFerguson, Ari Bernabei V, MD. Electronically Signed: Arnette NorrisMari Johnson Medical Scribe. 04/18/18. 11:15 AM.  I personally performed the services described in this documentation, which was SCRIBED in my presence. The recorded information has been reviewed and considered accurate. It has been edited as necessary during review. Tilda BurrowJohn V Abdurahman Rugg, MD

## 2018-04-24 ENCOUNTER — Inpatient Hospital Stay (HOSPITAL_COMMUNITY): Payer: BLUE CROSS/BLUE SHIELD | Admitting: Anesthesiology

## 2018-04-24 ENCOUNTER — Other Ambulatory Visit: Payer: Self-pay

## 2018-04-24 ENCOUNTER — Encounter (HOSPITAL_COMMUNITY): Payer: Self-pay | Admitting: *Deleted

## 2018-04-24 ENCOUNTER — Inpatient Hospital Stay (HOSPITAL_COMMUNITY)
Admission: AD | Admit: 2018-04-24 | Discharge: 2018-04-26 | DRG: 807 | Disposition: A | Discharge status: Home / Self Care | Payer: BLUE CROSS/BLUE SHIELD | Attending: Obstetrics & Gynecology | Admitting: Obstetrics & Gynecology

## 2018-04-24 DIAGNOSIS — Z3A40 40 weeks gestation of pregnancy: Secondary | ICD-10-CM | POA: Diagnosis not present

## 2018-04-24 DIAGNOSIS — O4202 Full-term premature rupture of membranes, onset of labor within 24 hours of rupture: Secondary | ICD-10-CM | POA: Diagnosis not present

## 2018-04-24 DIAGNOSIS — K219 Gastro-esophageal reflux disease without esophagitis: Secondary | ICD-10-CM

## 2018-04-24 DIAGNOSIS — O4292 Full-term premature rupture of membranes, unspecified as to length of time between rupture and onset of labor: Secondary | ICD-10-CM | POA: Diagnosis present

## 2018-04-24 DIAGNOSIS — O480 Post-term pregnancy: Secondary | ICD-10-CM | POA: Diagnosis not present

## 2018-04-24 LAB — TYPE AND SCREEN
ABO/RH(D): O POS
Antibody Screen: NEGATIVE

## 2018-04-24 LAB — CBC
HCT: 34 % — ABNORMAL LOW (ref 36.0–46.0)
Hemoglobin: 10.8 g/dL — ABNORMAL LOW (ref 12.0–15.0)
MCH: 25.5 pg — AB (ref 26.0–34.0)
MCHC: 31.8 g/dL (ref 30.0–36.0)
MCV: 80.2 fL (ref 80.0–100.0)
Platelets: 167 10*3/uL (ref 150–400)
RBC: 4.24 MIL/uL (ref 3.87–5.11)
RDW: 14.9 % (ref 11.5–15.5)
WBC: 6.3 10*3/uL (ref 4.0–10.5)
nRBC: 0 % (ref 0.0–0.2)

## 2018-04-24 LAB — POCT FERN TEST: POCT Fern Test: POSITIVE

## 2018-04-24 MED ORDER — LACTATED RINGERS IV SOLN
500.0000 mL | INTRAVENOUS | Status: DC | PRN
  Administered 2018-04-24: 500 mL via INTRAVENOUS

## 2018-04-24 MED ORDER — FENTANYL CITRATE (PF) 100 MCG/2ML IJ SOLN
100.0000 ug | INTRAMUSCULAR | Status: DC | PRN
  Administered 2018-04-24 (×2): 100 ug via INTRAVENOUS
  Filled 2018-04-24: qty 2

## 2018-04-24 MED ORDER — OXYTOCIN 40 UNITS IN LACTATED RINGERS INFUSION - SIMPLE MED
1.0000 m[IU]/min | INTRAVENOUS | Status: DC
  Administered 2018-04-24: 2 m[IU]/min via INTRAVENOUS

## 2018-04-24 MED ORDER — PHENYLEPHRINE 40 MCG/ML (10ML) SYRINGE FOR IV PUSH (FOR BLOOD PRESSURE SUPPORT)
80.0000 ug | PREFILLED_SYRINGE | INTRAVENOUS | Status: DC | PRN
  Filled 2018-04-24 (×2): qty 10

## 2018-04-24 MED ORDER — PHENYLEPHRINE 40 MCG/ML (10ML) SYRINGE FOR IV PUSH (FOR BLOOD PRESSURE SUPPORT)
80.0000 ug | PREFILLED_SYRINGE | INTRAVENOUS | Status: DC | PRN
  Filled 2018-04-24: qty 10

## 2018-04-24 MED ORDER — DIPHENHYDRAMINE HCL 50 MG/ML IJ SOLN: 12.5000 mg | INTRAMUSCULAR | Status: DC | PRN

## 2018-04-24 MED ORDER — LACTATED RINGERS IV SOLN
INTRAVENOUS | Status: DC
  Administered 2018-04-24 (×2): via INTRAVENOUS

## 2018-04-24 MED ORDER — ONDANSETRON HCL 4 MG/2ML IJ SOLN: 4.0000 mg | Freq: Four times a day (QID) | INTRAMUSCULAR | Status: DC | PRN

## 2018-04-24 MED ORDER — EPHEDRINE 5 MG/ML INJ
10.0000 mg | INTRAVENOUS | Status: DC | PRN
  Filled 2018-04-24: qty 2

## 2018-04-24 MED ORDER — LIDOCAINE HCL (PF) 1 % IJ SOLN
30.0000 mL | INTRAMUSCULAR | Status: DC | PRN
  Administered 2018-04-25: 30 mL via SUBCUTANEOUS
  Filled 2018-04-24: qty 30

## 2018-04-24 MED ORDER — TERBUTALINE SULFATE 1 MG/ML IJ SOLN
0.2500 mg | Freq: Once | INTRAMUSCULAR | Status: DC | PRN
  Filled 2018-04-24: qty 1

## 2018-04-24 MED ORDER — OXYTOCIN BOLUS FROM INFUSION
500.0000 mL | Freq: Once | INTRAVENOUS | Status: AC
  Administered 2018-04-25: 500 mL via INTRAVENOUS

## 2018-04-24 MED ORDER — LACTATED RINGERS IV SOLN: 500.0000 mL | Freq: Once | INTRAVENOUS | Status: DC

## 2018-04-24 MED ORDER — ACETAMINOPHEN 325 MG PO TABS: 650.0000 mg | ORAL_TABLET | ORAL | Status: DC | PRN

## 2018-04-24 MED ORDER — OXYCODONE-ACETAMINOPHEN 5-325 MG PO TABS: 2.0000 | ORAL_TABLET | ORAL | Status: DC | PRN

## 2018-04-24 MED ORDER — FENTANYL 2.5 MCG/ML BUPIVACAINE 1/10 % EPIDURAL INFUSION (WH - ANES)
14.0000 mL/h | INTRAMUSCULAR | Status: DC | PRN
  Administered 2018-04-24: 14 mL/h via EPIDURAL
  Filled 2018-04-24: qty 100

## 2018-04-24 MED ORDER — OXYCODONE-ACETAMINOPHEN 5-325 MG PO TABS: 1.0000 | ORAL_TABLET | ORAL | Status: DC | PRN

## 2018-04-24 MED ORDER — SOD CITRATE-CITRIC ACID 500-334 MG/5ML PO SOLN: 30.0000 mL | ORAL | Status: DC | PRN

## 2018-04-24 MED ORDER — FENTANYL CITRATE (PF) 100 MCG/2ML IJ SOLN
INTRAMUSCULAR | Status: AC
  Administered 2018-04-24: 100 ug via INTRAVENOUS
  Filled 2018-04-24: qty 2

## 2018-04-24 MED ORDER — MISOPROSTOL 50MCG HALF TABLET
50.0000 ug | ORAL_TABLET | ORAL | Status: DC
  Filled 2018-04-24 (×3): qty 1

## 2018-04-24 MED ORDER — OXYTOCIN 40 UNITS IN LACTATED RINGERS INFUSION - SIMPLE MED
2.5000 [IU]/h | INTRAVENOUS | Status: DC
  Administered 2018-04-25: 2.5 [IU]/h via INTRAVENOUS
  Filled 2018-04-24: qty 1000

## 2018-04-24 MED ORDER — LIDOCAINE HCL (PF) 1 % IJ SOLN
INTRAMUSCULAR | Status: DC | PRN
  Administered 2018-04-24 (×2): 4 mL via EPIDURAL

## 2018-04-24 NOTE — Anesthesia Pain Management Evaluation Note (Signed)
  [facility] Pain Management Visit Note  Patient: Deborah Evans, 22 y.o., female  "Hello I am a member of the anesthesia team at [facility]. We have an anesthesia team available at all times to provide care throughout the hospital, including epidural management and anesthesia for C-section. I don't know your plan for the delivery whether it a natural birth, water birth, IV sedation, nitrous supplementation, doula or epidural, but we want to meet your pain goals."   1.Was your pain managed to your expectations on prior hospitalizations?   Yes   2.What is your expectation for pain management during this hospitalization?     Epidural  3.How can we help you reach that goal? Epidural in place  Record the patient's initial score and the patient's pain goal.   Pain: 4  Pain Goal: 4 [facility] wants you to be able to say your pain was always managed very well.  Deborah Evans 04/24/2018

## 2018-04-24 NOTE — Anesthesia Procedure Notes (Signed)
Epidural Patient location during procedure: OB  Staffing Anesthesiologist: Cydnie Deason, MD Performed: anesthesiologist   Preanesthetic Checklist Completed: patient identified, pre-op evaluation, timeout performed, IV checked, risks and benefits discussed and monitors and equipment checked  Epidural Patient position: sitting Prep: site prepped and draped and DuraPrep Patient monitoring: heart rate, continuous pulse ox and blood pressure Approach: midline Location: L3-L4 Injection technique: LOR air and LOR saline  Needle:  Needle type: Tuohy  Needle gauge: 17 G Needle length: 9 cm Needle insertion depth: 5 cm Catheter type: closed end flexible Catheter size: 19 Gauge Catheter at skin depth: 10 cm Test dose: negative  Assessment Sensory level: T8 Events: blood not aspirated, injection not painful, no injection resistance, negative IV test and no paresthesia  Additional Notes Reason for block:procedure for pain     

## 2018-04-24 NOTE — MAU Note (Signed)
Pt presents to MAU with complaints of contractions that started a little over an hour ago. Denies any VB or LOF

## 2018-04-24 NOTE — Anesthesia Preprocedure Evaluation (Signed)
Anesthesia Evaluation  Patient identified by MRN, date of birth, ID band Patient awake    Reviewed: Allergy & Precautions, NPO status , Patient's Chart, lab work & pertinent test results  Airway Mallampati: I  TM Distance: >3 FB     Dental  (+) Teeth Intact, Dental Advisory Given   Pulmonary neg pulmonary ROS,    breath sounds clear to auscultation       Cardiovascular negative cardio ROS   Rhythm:Regular Rate:Normal     Neuro/Psych    GI/Hepatic negative GI ROS,   Endo/Other    Renal/GU      Musculoskeletal   Abdominal   Peds  Hematology   Anesthesia Other Findings   Reproductive/Obstetrics (+) Pregnancy                             Anesthesia Physical  Anesthesia Plan  ASA: II  Anesthesia Plan: Epidural   Post-op Pain Management:    Induction:   PONV Risk Score and Plan:   Airway Management Planned:   Additional Equipment:   Intra-op Plan:   Post-operative Plan:   Informed Consent: I have reviewed the patients History and Physical, chart, labs and discussed the procedure including the risks, benefits and alternatives for the proposed anesthesia with the patient or authorized representative who has indicated his/her understanding and acceptance.     Plan Discussed with:   Anesthesia Plan Comments:         Anesthesia Quick Evaluation

## 2018-04-25 ENCOUNTER — Other Ambulatory Visit: Payer: BLUE CROSS/BLUE SHIELD | Admitting: Women's Health

## 2018-04-25 ENCOUNTER — Telehealth: Payer: Self-pay | Admitting: *Deleted

## 2018-04-25 ENCOUNTER — Encounter (HOSPITAL_COMMUNITY): Payer: Self-pay

## 2018-04-25 DIAGNOSIS — Z3A40 40 weeks gestation of pregnancy: Secondary | ICD-10-CM

## 2018-04-25 DIAGNOSIS — O480 Post-term pregnancy: Secondary | ICD-10-CM

## 2018-04-25 DIAGNOSIS — O4202 Full-term premature rupture of membranes, onset of labor within 24 hours of rupture: Secondary | ICD-10-CM

## 2018-04-25 LAB — RPR: RPR Ser Ql: NONREACTIVE

## 2018-04-25 MED ORDER — BENZOCAINE-MENTHOL 20-0.5 % EX AERO
1.0000 "application " | INHALATION_SPRAY | CUTANEOUS | Status: DC | PRN
  Filled 2018-04-25: qty 56

## 2018-04-25 MED ORDER — WITCH HAZEL-GLYCERIN EX PADS: 1.0000 "application " | MEDICATED_PAD | CUTANEOUS | Status: DC | PRN

## 2018-04-25 MED ORDER — DIBUCAINE 1 % RE OINT: 1.0000 "application " | TOPICAL_OINTMENT | RECTAL | Status: DC | PRN

## 2018-04-25 MED ORDER — TETANUS-DIPHTH-ACELL PERTUSSIS 5-2.5-18.5 LF-MCG/0.5 IM SUSP: 0.5000 mL | Freq: Once | INTRAMUSCULAR | Status: DC

## 2018-04-25 MED ORDER — DIPHENHYDRAMINE HCL 25 MG PO CAPS: 25.0000 mg | ORAL_CAPSULE | Freq: Four times a day (QID) | ORAL | Status: DC | PRN

## 2018-04-25 MED ORDER — COCONUT OIL OIL: 1.0000 "application " | TOPICAL_OIL | Status: DC | PRN

## 2018-04-25 MED ORDER — ACETAMINOPHEN 325 MG PO TABS: 650.0000 mg | ORAL_TABLET | ORAL | Status: DC | PRN

## 2018-04-25 MED ORDER — IBUPROFEN 600 MG PO TABS
600.0000 mg | ORAL_TABLET | Freq: Four times a day (QID) | ORAL | Status: DC
  Administered 2018-04-25 – 2018-04-26 (×6): 600 mg via ORAL
  Filled 2018-04-25 (×7): qty 1

## 2018-04-25 MED ORDER — PRENATAL MULTIVITAMIN CH
1.0000 | ORAL_TABLET | Freq: Every day | ORAL | Status: DC
  Administered 2018-04-25 – 2018-04-26 (×2): 1 via ORAL
  Filled 2018-04-25 (×2): qty 1

## 2018-04-25 MED ORDER — VITAMIN K1 1 MG/0.5ML IJ SOLN
INTRAMUSCULAR | Status: AC
  Filled 2018-04-25: qty 0.5

## 2018-04-25 MED ORDER — SIMETHICONE 80 MG PO CHEW: 80.0000 mg | CHEWABLE_TABLET | ORAL | Status: DC | PRN

## 2018-04-25 MED ORDER — MEASLES, MUMPS & RUBELLA VAC IJ SOLR
0.5000 mL | Freq: Once | INTRAMUSCULAR | Status: DC
  Filled 2018-04-25: qty 0.5

## 2018-04-25 MED ORDER — ONDANSETRON HCL 4 MG PO TABS: 4.0000 mg | ORAL_TABLET | ORAL | Status: DC | PRN

## 2018-04-25 MED ORDER — ONDANSETRON HCL 4 MG/2ML IJ SOLN: 4.0000 mg | INTRAMUSCULAR | Status: DC | PRN

## 2018-04-25 MED ORDER — SENNOSIDES-DOCUSATE SODIUM 8.6-50 MG PO TABS
2.0000 | ORAL_TABLET | ORAL | Status: DC
  Administered 2018-04-25: 2 via ORAL
  Filled 2018-04-25: qty 2

## 2018-04-25 NOTE — H&P (Signed)
LABOR AND DELIVERY ADMISSION HISTORY AND PHYSICAL NOTE  Deborah Evans is a 22 y.o. female G2P0010 with IUP at 5424w2d by 14w US presenting for PROM at noon today.  She reports positive fetal movement. She denies leakage of fluid or vaginal bleeding.  Pren[facility] History/Complications[facility] Pregnancy complications:  - poor weight gain due to severe N/V  Past Medical History: Past Medical History:  Diagnosis Date  . Hyperemesis gravidarum     Past Surgical History: Past Surgical History:  Procedure Laterality Date  . DILATION AND CURETTAGE OF UTERUS N/A 07/15/2014   Procedure: SUCTION DILATATION AND CURETTAGE;  Surgeon: Lazaro ArmsLuther H Eure, MD;  Location: [facility];  Service: Gynecology;  Laterality: N/A;    Obstetrical History: OB History    Gravida  2   Para      Term      Preterm      AB  1   Living  0     SAB  1   TAB      Ectopic      Multiple      Live Births              Social History: Social History   Socioeconomic History  . Marital status: Single    Spouse name: Not on file  . Number of children: Not on file  . Years of education: Not on file  . Highest education level: Not on file  Occupational History  . Occupation: none  Social Needs  . Financial resource strain: Not on file  . Food insecurity:    Worry: Patient refused    Inability: Patient refused  . Transportation needs:    Medical: Patient refused    Non-medical: Patient refused  Tobacco Use  . Smoking status: Never Smoker  . Smokeless tobacco: Never Used  Substance and Sexual Activity  . Alcohol use: No  . Drug use: No  . Sexual activity: Not Currently    Birth control/protection: None  Lifestyle  . Physical activity:    Days per week: Patient refused    Minutes per session: Patient refused  . Stress: Not on file  Relationships  . Social connections:    Talks on phone: Not on file    Gets together: Not on file    Attends religious service: Not on file   Active member of club or organization: Not on file    Attends meetings of clubs or organizations: Not on file    Relationship status: Not on file  Other Topics Concern  . Not on file  Social History Narrative  . Not on file    Family History: Family History  Problem Relation Age of Onset  . Hypertension Maternal Grandmother   . ADD / ADHD Neg Hx   . Alcohol abuse Neg Hx   . Anxiety disorder Neg Hx   . Arthritis Neg Hx   . Birth defects Neg Hx   . Asthma Neg Hx   . Cancer Neg Hx   . COPD Neg Hx   . Depression Neg Hx   . Diabetes Neg Hx   . Drug abuse Neg Hx   . Early death Neg Hx   . Hearing loss Neg Hx   . Heart disease Neg Hx   . Hyperlipidemia Neg Hx   . Intellectual disability Neg Hx   . Kidney disease Neg Hx   . Learning disabilities Neg Hx   . Miscarriages / Stillbirths Neg Hx   .  Obesity Neg Hx   . Stroke Neg Hx   . Vision loss Neg Hx   . Varicose Veins Neg Hx     Allergies: No Known Allergies  Medications Prior to Admission  Medication Sig Dispense Refill Last Dose  . ondansetron (ZOFRAN-ODT) 4 MG disintegrating tablet Take 1 tablet (4 mg total) by mouth every 8 (eight) hours as needed for nausea or vomiting. (Patient not taking: Reported on 04/18/2018) 30 tablet 0 Not Taking  . Prenatal MV-Min-FA-Omega-3 (PRENATAL GUMMIES/DHA & FA PO) Take 2 tablets by mouth daily.    Taking  . promethazine (PHENERGAN) 12.5 MG tablet TAKE 1 TABLET (12.5 MG TOTAL) BY MOUTH EVERY 6 (SIX) HOURS. (Patient not taking: Reported on 04/18/2018) 60 tablet 0 Not Taking  . pyridOXINE (VITAMIN B-6) 50 MG tablet TAKE 1 TABLET BY MOUTH EVERYDAY AT BEDTIME (Patient not taking: Reported on 04/18/2018) 30 tablet 0 Not Taking     Review of Systems  All systems reviewed and negative except as stated in HPI  Physical Exam Blood pressure (!) 142/85, pulse 99, temperature 98.2 F (36.8 C), temperature source Oral, resp. rate 18, height 5\' 6"  (1.676 m), weight 64 kg, last menstrual period  08/23/2017, SpO2 100 %, unknown if currently breastfeeding. General appearance: alert, oriented, NAD Lungs: normal respiratory effort Heart: regular rate Abdomen: soft, non-tender; gravid, FH appropriate for GA Extremities: No calf swelling or tenderness Presentation: cephalic Fetal monitoring: baseline 130s/mod var/+ acels/no decels Uterine activity: irritability + intermittent contractions Dilation: 1 Effacement (%): 90 Station: -2 Exam by:: Normand SloopK. Jones RN  Prenatal labs: ABO, Rh: --/--/O POS (12/25 1545) Antibody: NEG (12/25 1545) Rubella: 5.20 (07/11 1417) RPR: Non Reactive (09/20 0902)  HBsAg: Negative (07/11 1417)  HIV: Non Reactive (09/20 0902)  GC/Chlamydia: negative GBS: Negative (12/05 1450)  2-hr GTT: 75/118/120 Genetic screening:  negative Anatomy US: normal  Prenatal Transfer Tool  Maternal Diabetes: No Genetic Screening: Normal Maternal Ultrasounds/Referrals: Normal Fetal Ultrasounds or other Referrals:  None Maternal Substance Abuse:  No Significant Maternal Medications:  None Significant Maternal Lab Results: None  Results for orders placed or performed during the hospital encounter of 04/24/18 (from the past 24 hour(s))  Fern Test   Collection Time: 04/24/18  3:39 PM  Result Value Ref Range   POCT Fern Test Positive = ruptured amniotic membanes   CBC   Collection Time: 04/24/18  3:45 PM  Result Value Ref Range   WBC 6.3 4.0 - 10.5 K/uL   RBC 4.24 3.87 - 5.11 MIL/uL   Hemoglobin 10.8 (L) 12.0 - 15.0 g/dL   HCT 69.634.0 (L) 29.536.0 - 28.446.0 %   MCV 80.2 80.0 - 100.0 fL   MCH 25.5 (L) 26.0 - 34.0 pg   MCHC 31.8 30.0 - 36.0 g/dL   RDW 13.214.9 44.011.5 - 10.215.5 %   Platelets 167 150 - 400 K/uL   nRBC 0.0 0.0 - 0.2 %  Type and screen [facility]   Collection Time: 04/24/18  3:45 PM  Result Value Ref Range   ABO/RH(D) O POS    Antibody Screen NEG    Sample Expiration      04/27/2018 Performed at [facility], [address] [place] [id number]     Patient Active Problem List   Diagnosis Date Noted  . Labor and delivery indication for care or intervention 04/24/2018  . GERD (gastroesophageal reflux disease) 01/02/2018  . Hypokalemia 11/28/2017  . Supervision of normal pregnancy 11/08/2017    Assessment: Deborah Evans is a 22 y.o. G2P0010 at [redacted]w[redacted]d here for IOL for PROM at term   #Labor: will start induction with FB + cytotec #Pain: Desires epidural #FWB: Cat 1 #ID:  GBS neg #MOF: Bottle #MOC:undecided #Circ:  NA  Gwenevere Abbot, MD  Ob Fellow 04/25/2018, 3:11 AM

## 2018-04-25 NOTE — Telephone Encounter (Signed)
Lmom for pt to call us back to set up pp appt.  04-25-18  AS

## 2018-04-26 MED ORDER — IBUPROFEN 600 MG PO TABS: 600.0000 mg | ORAL_TABLET | Freq: Four times a day (QID) | ORAL | 0 refills | Status: DC

## 2018-04-26 MED ORDER — SENNOSIDES-DOCUSATE SODIUM 8.6-50 MG PO TABS: 2.0000 | ORAL_TABLET | ORAL | 0 refills | Status: DC

## 2018-04-26 MED ORDER — SIMETHICONE 80 MG PO CHEW: 80.0000 mg | CHEWABLE_TABLET | ORAL | 0 refills | Status: DC | PRN

## 2018-04-26 MED ORDER — ACETAMINOPHEN 325 MG PO TABS: 650.0000 mg | ORAL_TABLET | ORAL | 0 refills | Status: DC | PRN

## 2018-04-26 NOTE — Discharge Summary (Addendum)
Postpartum Discharge Summary     Patient Name: Deborah Evans DOB: 1995-11-14 MRN: [id number]  Date of admission: 04/24/2018 Delivering Provider: Aura Camps   Date of discharge: 04/26/2018  Admitting diagnosis: 40wks lower pressure Intrauterine pregnancy: [redacted]w[redacted]d    Secondary diagnosis:  Active Problems:   Labor and delivery indication for care or intervention  Additional problems: Premature rupture of membranes     Discharge diagnosis: Term Pregnancy Delivered                                                                                                Post partum procedures:none  Augmentation: Cytotec and Foley Balloon  Complications: None  Hospital course:  Induction of Labor With Vaginal Delivery   22y.o. yo G2P1011 at 457w2das admitted to the hospital 04/24/2018 for induction of labor.  Indication for induction: PROM.  Patient had an uncomplicated labor course as follows: Membrane Rupture Time/Date: 3:30 PM ,04/24/2018   Intrapartum Procedures: Episiotomy: None [1]                                         Lacerations:  2nd degree [3];Periurethral [8]  Patient had delivery of a Viable infant.  Information for the patient's newborn:  Deborah, Witting0[[id number]]Delivery Method: Vaginal, Spontaneous(Filed from Delivery Summary)   04/25/2018  Details of delivery can be found in separate delivery note.  Patient had a routine postpartum course. Patient is discharged home 04/26/18.  Magnesium Sulfate recieved: No BMZ received: No  Physical exam  Vitals:   04/25/18 1230 04/25/18 1610 04/25/18 2307 04/26/18 0543  BP: 122/80 121/87 115/79 126/87  Pulse: 91 94 87 85  Resp: '17 18 14 16  '$ Temp: 98.4 F (36.9 C) 98.4 F (36.9 C) 98.3 F (36.8 C) 98.2 F (36.8 C)  TempSrc: Oral Oral Oral Oral  SpO2:   100% 100%  Weight:      Height:       General: alert, sitting comfortably in bed Lochia: appropriate Uterine Fundus: firm Incision: N/A DVT Evaluation:  No evidence of DVT seen on physical exam. Labs: Lab Results  Component Value Date   WBC 6.3 04/24/2018   HGB 10.8 (L) 04/24/2018   HCT 34.0 (L) 04/24/2018   MCV 80.2 04/24/2018   PLT 167 04/24/2018   CMP Latest Ref Rng & Units 04/11/2018  Glucose 70 - 99 mg/dL 74  BUN 6 - 20 mg/dL <5(L)  Creatinine 0.44 - 1.00 mg/dL 0.57  Sodium 135 - 145 mmol/L 133(L)  Potassium 3.5 - 5.1 mmol/L 3.4(L)  Chloride 98 - 111 mmol/L 105  CO2 22 - 32 mmol/L 22  Calcium 8.9 - 10.3 mg/dL 8.4(L)  Total Protein 6.5 - 8.1 g/dL -  Total Bilirubin 0.3 - 1.2 mg/dL -  Alkaline Phos 38 - 126 U/L -  AST 15 - 41 U/L -  ALT 0 - 44 U/L -    Discharge instruction: per After Visit Summary and "Baby and Me Booklet".  After  visit meds:  Allergies as of 04/26/2018   No Known Allergies     Medication List    STOP taking these medications   ondansetron 4 MG disintegrating tablet Commonly known as:  ZOFRAN-ODT   promethazine 12.5 MG tablet Commonly known as:  PHENERGAN   pyridOXINE 50 MG tablet Commonly known as:  VITAMIN B-6     TAKE these medications   acetaminophen 325 MG tablet Commonly known as:  TYLENOL Take 2 tablets (650 mg total) by mouth every 4 (four) hours as needed (for pain scale < 4).   ibuprofen 600 MG tablet Commonly known as:  ADVIL,MOTRIN Take 1 tablet (600 mg total) by mouth every 6 (six) hours.   PRENATAL GUMMIES/DHA & FA PO Take 2 tablets by mouth daily.   senna-docusate 8.6-50 MG tablet Commonly known as:  Senokot-S Take 2 tablets by mouth daily. Start taking on:  April 27, 2018   simethicone 80 MG chewable tablet Commonly known as:  MYLICON Chew 1 tablet (80 mg total) by mouth as needed for flatulence.       Diet: routine diet  Activity: Advance as tolerated. Pelvic rest for 6 weeks.   Outpatient follow up:4 weeks Follow up Appt:No future appointments. Follow up Visit:   Please schedule this patient for Postpartum visit in: 4 weeks with the following  provider: Any provider For C/S patients schedule nurse incision check in weeks 2 weeks: no Low risk pregnancy complicated by: hyperemesis gravidarum Delivery mode:  SVD Anticipated Birth Control:  IUD PP Procedures needed: none  Schedule Integrated BH visit: no    Newborn Data: Live born female  Birth Weight: 6 lb 9.1 oz (2980 g) APGAR: 9, 9  Newborn Delivery   Birth date/time:  04/25/2018 02:18:00 Delivery type:  Vaginal, Spontaneous     Baby Feeding: Bottle Disposition:home with mother   04/26/2018 [name], MD   I personally saw and evaluated the patient, performing the key elements of the service. I developed and verified the management plan that is described in the resident's/student's note, and I agree with the content with my edits above. VSS, HRR&R, Resp unlabored, Legs neg.  Nigel Berthold, CNM 04/30/2018 9:24 AM

## 2018-04-29 NOTE — Anesthesia Postprocedure Evaluation (Signed)
Anesthesia Post Note  Patient: Deborah Evans  Procedure(s) Performed: AN AD HOC LABOR EPIDURAL     Patient location during evaluation: Mother Baby Anesthesia Type: Epidural Level of consciousness: awake and alert Pain management: pain level controlled Vital Signs Assessment: post-procedure vital signs reviewed and stable Respiratory status: spontaneous breathing, nonlabored ventilation and respiratory function stable Cardiovascular status: stable Postop Assessment: no headache, no backache and epidural receding Anesthetic complications: no     Last Vitals: There were no vitals filed for this visit.  Last Pain: There were no vitals filed for this visit.               Lewie LoronJohn Daphane Odekirk

## 2018-05-28 ENCOUNTER — Encounter: Payer: Self-pay | Admitting: Women's Health

## 2018-05-28 ENCOUNTER — Ambulatory Visit (INDEPENDENT_AMBULATORY_CARE_PROVIDER_SITE_OTHER): Payer: BLUE CROSS/BLUE SHIELD | Admitting: Women's Health

## 2018-05-28 DIAGNOSIS — Z8759 Personal history of other complications of pregnancy, childbirth and the puerperium: Secondary | ICD-10-CM

## 2018-05-28 NOTE — Patient Instructions (Signed)
NO SEX UNTIL AFTER YOU GET YOUR BIRTH CONTROL   Levonorgestrel intrauterine device (IUD) What is this medicine? LEVONORGESTREL IUD (LEE voe nor jes trel) is a contraceptive (birth control) device. The device is placed inside the uterus by a healthcare professional. It is used to prevent pregnancy. This device can also be used to treat heavy bleeding that occurs during your period. This medicine may be used for other purposes; ask your health care provider or pharmacist if you have questions. COMMON BRAND NAME(S): Kyleena, LILETTA, Mirena, Skyla What should I tell my health care provider before I take this medicine? They need to know if you have any of these conditions: -abnormal Pap smear -cancer of the breast, uterus, or cervix -diabetes -endometritis -genital or pelvic infection now or in the past -have more than one sexual partner or your partner has more than one partner -heart disease -history of an ectopic or tubal pregnancy -immune system problems -IUD in place -liver disease or tumor -problems with blood clots or take blood-thinners -seizures -use intravenous drugs -uterus of unusual shape -vaginal bleeding that has not been explained -an unusual or allergic reaction to levonorgestrel, other hormones, silicone, or polyethylene, medicines, foods, dyes, or preservatives -pregnant or trying to get pregnant -breast-feeding How should I use this medicine? This device is placed inside the uterus by a health care professional. Talk to your pediatrician regarding the use of this medicine in children. Special care may be needed. Overdosage: If you think you have taken too much of this medicine contact a poison control center or emergency room at once. NOTE: This medicine is only for you. Do not share this medicine with others. What if I miss a dose? This does not apply. Depending on the brand of device you have inserted, the device will need to be replaced every 3 to 5 years if you  wish to continue using this type of birth control. What may interact with this medicine? Do not take this medicine with any of the following medications: -amprenavir -bosentan -fosamprenavir This medicine may also interact with the following medications: -aprepitant -armodafinil -barbiturate medicines for inducing sleep or treating seizures -bexarotene -boceprevir -griseofulvin -medicines to treat seizures like carbamazepine, ethotoin, felbamate, oxcarbazepine, phenytoin, topiramate -modafinil -pioglitazone -rifabutin -rifampin -rifapentine -some medicines to treat HIV infection like atazanavir, efavirenz, indinavir, lopinavir, nelfinavir, tipranavir, ritonavir -St. John's wort -warfarin This list may not describe all possible interactions. Give your health care provider a list of all the medicines, herbs, non-prescription drugs, or dietary supplements you use. Also tell them if you smoke, drink alcohol, or use illegal drugs. Some items may interact with your medicine. What should I watch for while using this medicine? Visit your doctor or health care professional for regular check ups. See your doctor if you or your partner has sexual contact with others, becomes HIV positive, or gets a sexual transmitted disease. This product does not protect you against HIV infection (AIDS) or other sexually transmitted diseases. You can check the placement of the IUD yourself by reaching up to the top of your vagina with clean fingers to feel the threads. Do not pull on the threads. It is a good habit to check placement after each menstrual period. Call your doctor right away if you feel more of the IUD than just the threads or if you cannot feel the threads at all. The IUD may come out by itself. You may become pregnant if the device comes out. If you notice that the IUD has come out   use a backup birth control method like condoms and call your health care provider. Using tampons will not change the  position of the IUD and are okay to use during your period. This IUD can be safely scanned with magnetic resonance imaging (MRI) only under specific conditions. Before you have an MRI, tell your healthcare provider that you have an IUD in place, and which type of IUD you have in place. What side effects may I notice from receiving this medicine? Side effects that you should report to your doctor or health care professional as soon as possible: -allergic reactions like skin rash, itching or hives, swelling of the face, lips, or tongue -fever, flu-like symptoms -genital sores -high blood pressure -no menstrual period for 6 weeks during use -pain, swelling, warmth in the leg -pelvic pain or tenderness -severe or sudden headache -signs of pregnancy -stomach cramping -sudden shortness of breath -trouble with balance, talking, or walking -unusual vaginal bleeding, discharge -yellowing of the eyes or skin Side effects that usually do not require medical attention (report to your doctor or health care professional if they continue or are bothersome): -acne -breast pain -change in sex drive or performance -changes in weight -cramping, dizziness, or faintness while the device is being inserted -headache -irregular menstrual bleeding within first 3 to 6 months of use -nausea This list may not describe all possible side effects. Call your doctor for medical advice about side effects. You may report side effects to FDA at 1-800-FDA-1088. Where should I keep my medicine? This does not apply. NOTE: This sheet is a summary. It may not cover all possible information. If you have questions about this medicine, talk to your doctor, pharmacist, or health care provider.  2019 Elsevier/Gold Standard (2016-01-28 14:14:56)  

## 2018-05-28 NOTE — Progress Notes (Signed)
   POSTPARTUM VISIT Patient name: Deborah Evans MRN [id number]  Date of birth: 05/23/1995 Chief Complaint:   Postpartum Care  History of Present Illness:   Deborah Evans is a 23 y.o. 852P1011 African American female being seen today for a postpartum visit. She is 4 weeks postpartum following a spontaneous vaginal delivery at 40.3 gestational weeks. Anesthesia: epidural. Laceration: 2nd degree. I have fully reviewed the prenatal and intrapartum course. Pregnancy complicated by hyperemesis. Postpartum course has been uncomplicated. Bleeding on period. Bowel function is normal. Bladder function is normal.  Patient is not sexually active. Last sexual activity: prior to birth of baby.  Contraception method is wants IUD.  [place] Postpartum Depression Screening: negative. Score 0.   Last pap 11/08/17.  Results were normal .  No LMP recorded.  Baby's course has been uncomplicated. Baby is feeding by bottle.  Review of Systems:   Pertinent items are noted in HPI Denies Abnormal vaginal discharge w/ itching/odor/irritation, headaches, visual changes, shortness of breath, chest pain, abdominal pain, severe nausea/vomiting, or problems with urination or bowel movements. Pertinent History Reviewed:  Reviewed past medical,surgical, obstetrical and family history.  Reviewed problem list, medications and allergies. OB History  Gravida Para Term Preterm AB Living  2 1 1   1 1   SAB TAB Ectopic Multiple Live Births  1     0 1    # Outcome Date GA Lbr Len/2nd Weight Sex Delivery Anes PTL Lv  2 Term 04/25/18 [id number] 13:50 / 00:28 6 lb 9.1 oz (2.98 kg) F Vag-Spont EPI  LIV  1 SAB 06/2014 [id number]          Physical Assessment:   Vitals:   05/28/18 1341 05/28/18 1345  BP: (!) 143/92 135/85  Pulse: (!) 110   Weight: 133 lb 6.4 oz (60.5 kg)   Height: 5\' 8"  (1.727 m)   Body mass index is 20.28 kg/m.       Physical Examination:   General appearance: alert, well appearing, and in no distress  Mental  status: alert, oriented to person, place, and time  Skin: warm & dry   Cardiovascular: normal heart rate noted   Respiratory: normal respiratory effort, no distress   Breasts: deferred, no complaints   Abdomen: soft, non-tender   Pelvic: VULVA: normal appearing vulva with no masses, tenderness or lesions, UTERUS: uterus is normal size, shape, consistency and nontender  Rectal: no hemorrhoids  Extremities: no edema       No results found for this or any previous visit (from the past 24 hour(s)).  Assessment & Plan:  1) Postpartum exam 2) 4 wks s/p SVB 3) Bottlefeeding 4) Depression screening 5) Contraception counseling, pt prefers abstinence until IUD insertion  6) BP initially elevated> no h/o HTN, will recheck at IUD visit  Meds: No orders of the defined types were placed in this encounter.   Follow-up: Return for 1st available IUD insertion.   No orders of the defined types were placed in this encounter.   Cheral MarkerKimberly R Niyanna Asch CNM, [facility]NP-BC 05/28/2018 2:03 PM

## 2018-06-03 ENCOUNTER — Ambulatory Visit: Payer: BLUE CROSS/BLUE SHIELD | Admitting: Women's Health

## 2018-06-03 ENCOUNTER — Encounter: Payer: Self-pay | Admitting: Women's Health

## 2019-08-06 IMAGING — US US ABDOMEN COMPLETE
1 series · 15 of 25 positions shown · non-contrast
Comparison: None.

CLINICAL DATA: Chronic vomiting.

EXAM:
ABDOMEN ULTRASOUND COMPLETE

[Series 1: us abdomen complete · 94 acquisitions, 15 frames shown]
[im 1/94]
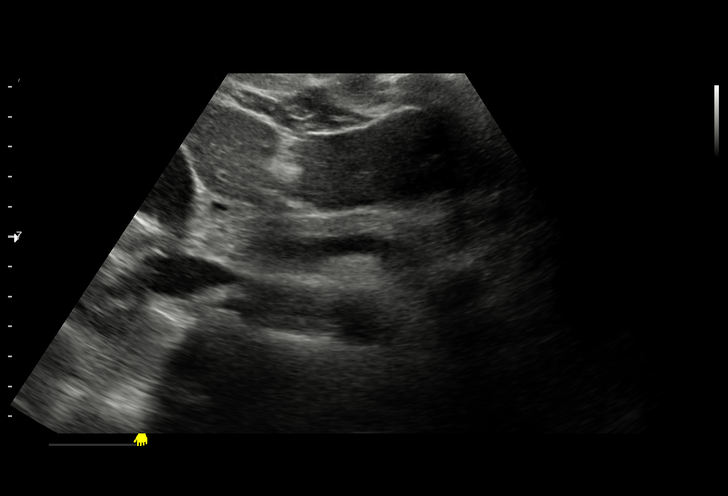
[im 8/94]
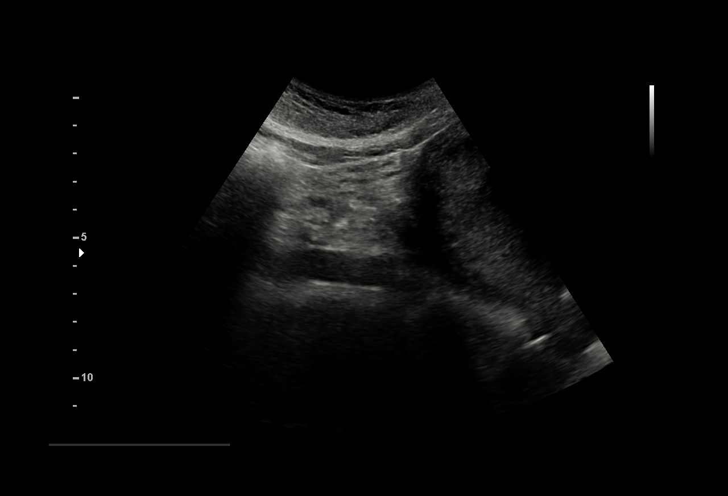
[im 16/94]
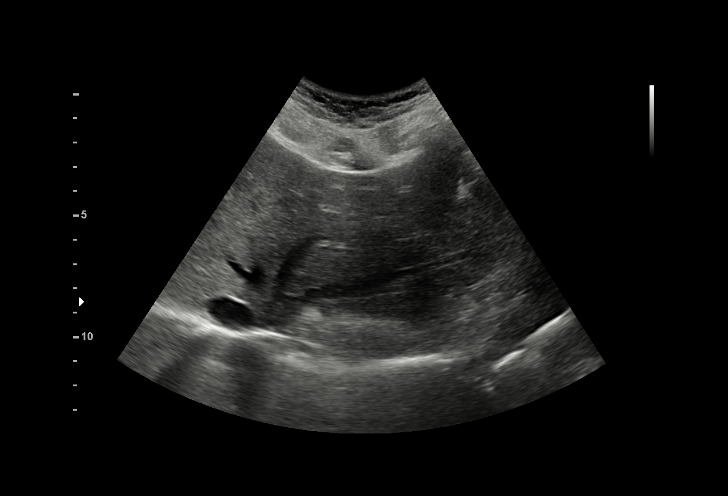
[im 20/94]
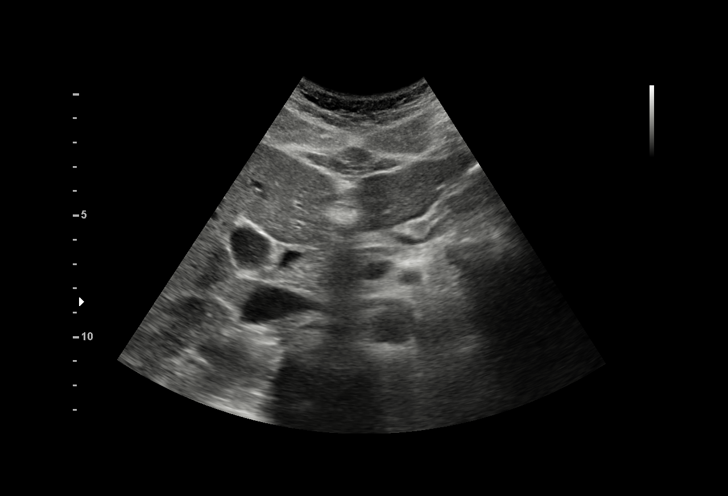
[im 28/94]
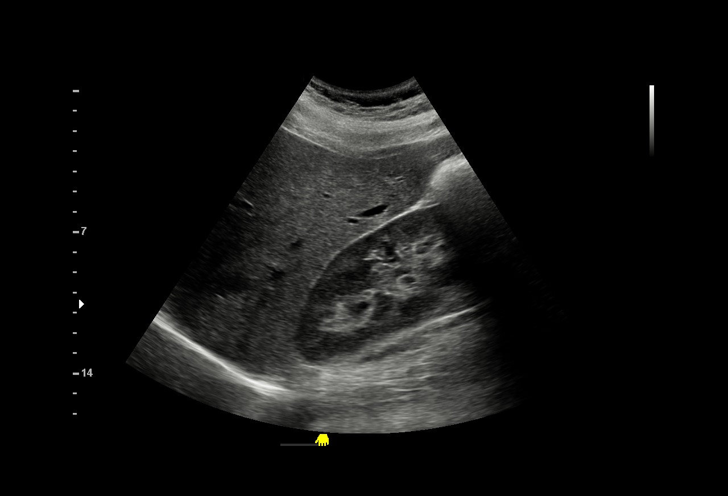
[im 35/94]
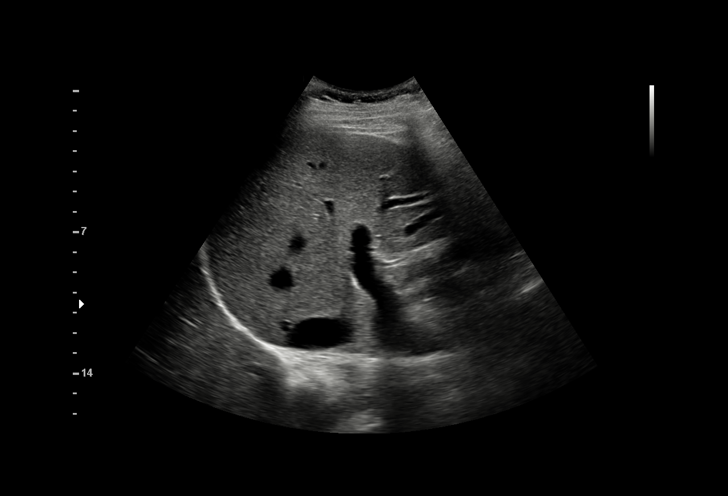
[im 39/94]
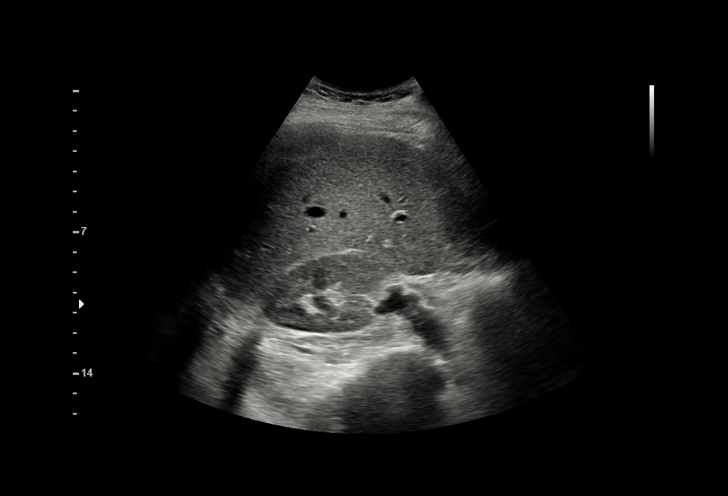
[im 47/94]
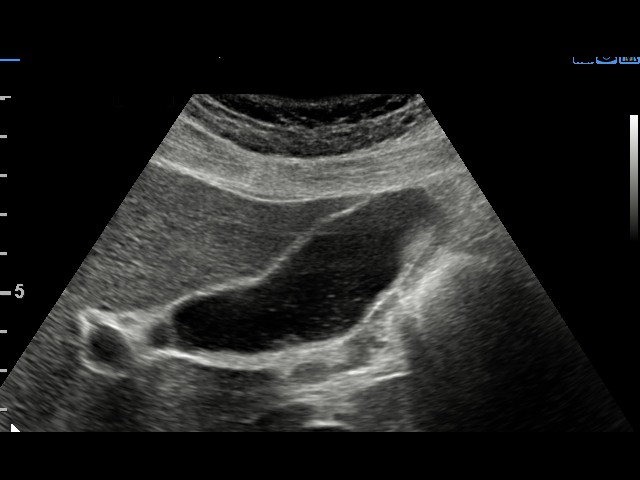
[im 55/94]
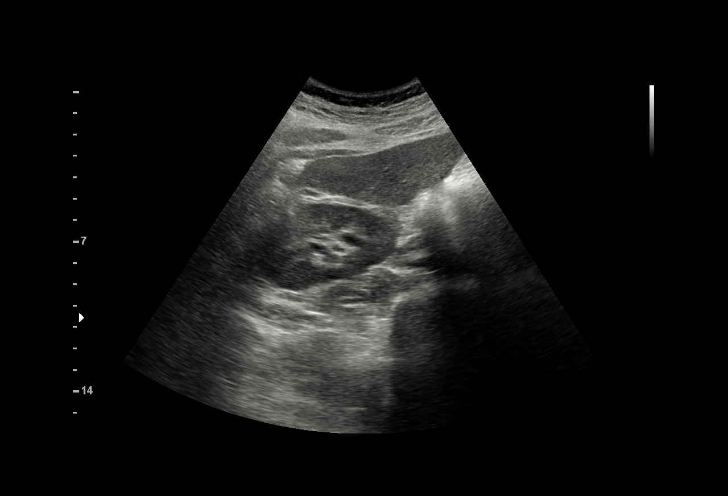
[im 59/94]
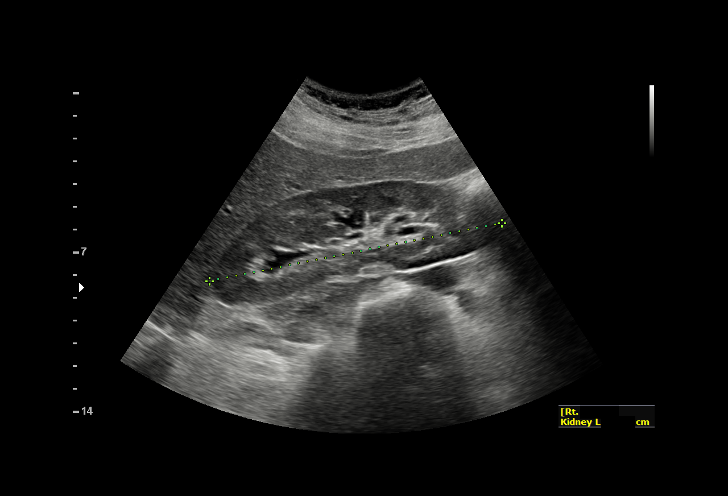
[im 66/94]
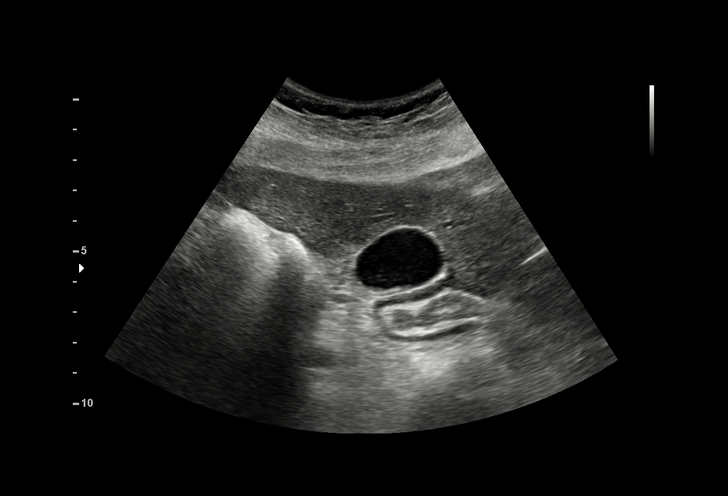
[im 74/94]
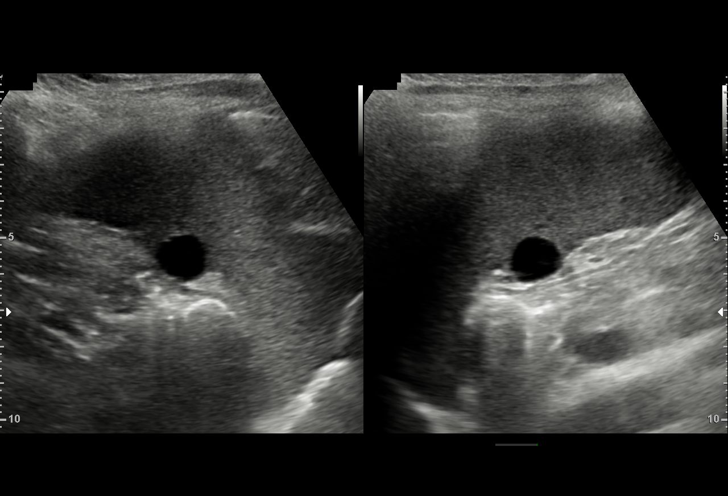
[im 78/94]
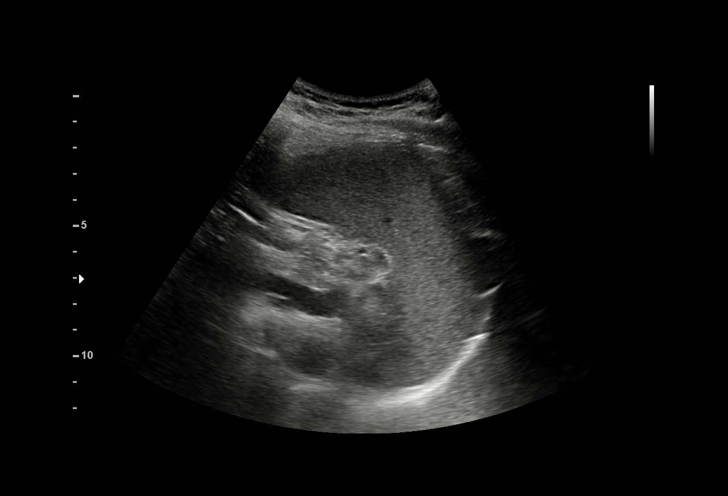
[im 86/94]
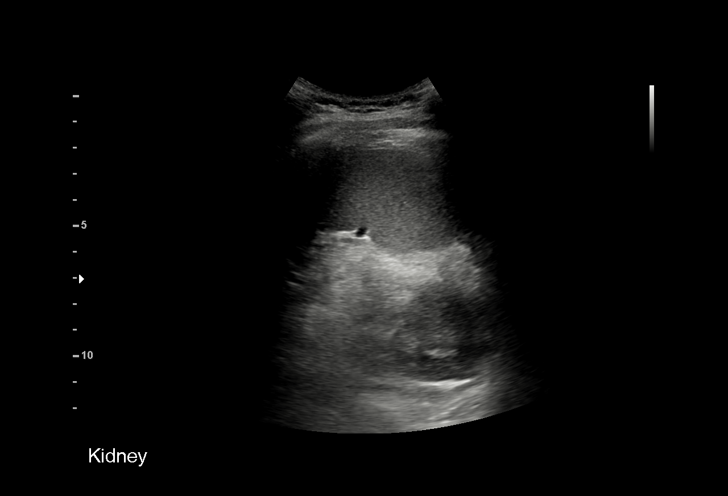
[im 94/94]
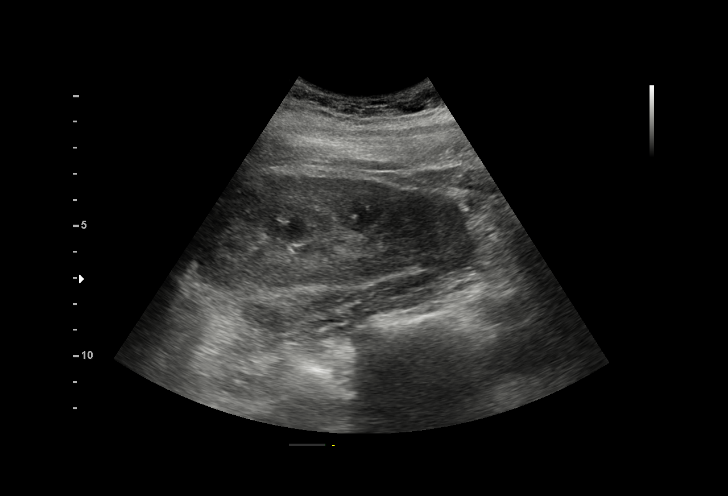

[15 of 25 positions shown; findings below may reference images not displayed]

FINDINGS: Gallbladder: Mild echogenic sludge and stones are seen within the
gallbladder. No gallbladder wall thickening or pericholecystic fluid
is seen. No ultrasonographic Murphy's sign is elicited.

Common bile duct: Diameter: 0.2 cm, within normal limits in caliber.

Liver: No focal lesion identified. Within normal limits in
parenchymal echogenicity. Portal vein is patent on color Doppler
imaging with normal direction of blood flow towards the liver.

IVC: No abnormality visualized.

Pancreas: Visualized portion unremarkable.

Spleen: Size and appearance within normal limits. A simple 1.4 cm
cyst is noted within the spleen.

Right Kidney: Length: 13.1 cm. Echogenicity within normal limits. No
mass or hydronephrosis visualized.

Left Kidney: Length: 11.7 cm. Echogenicity within normal limits. No
mass or hydronephrosis visualized.

Abdominal aorta: No aneurysm visualized.

Other findings: None.
IMPRESSION: 1. No acute abnormality seen to explain the patient's symptoms.
2. Mild echogenic sludge and stones within the gallbladder. No
evidence for cholecystitis.
3. Small splenic cyst noted.

## 2019-09-08 IMAGING — RF DG ABDOMEN 1V
1 series · 2 of 2 positions shown · non-contrast
Comparison: None.

CLINICAL DATA: 10 fr Cortrak placed  By  HIRA 54  Sec fluoro

EXAM:
ABDOMEN - 1 VIEW

[Series 1: run · 2 of 2 slices shown]
[im 1/2]
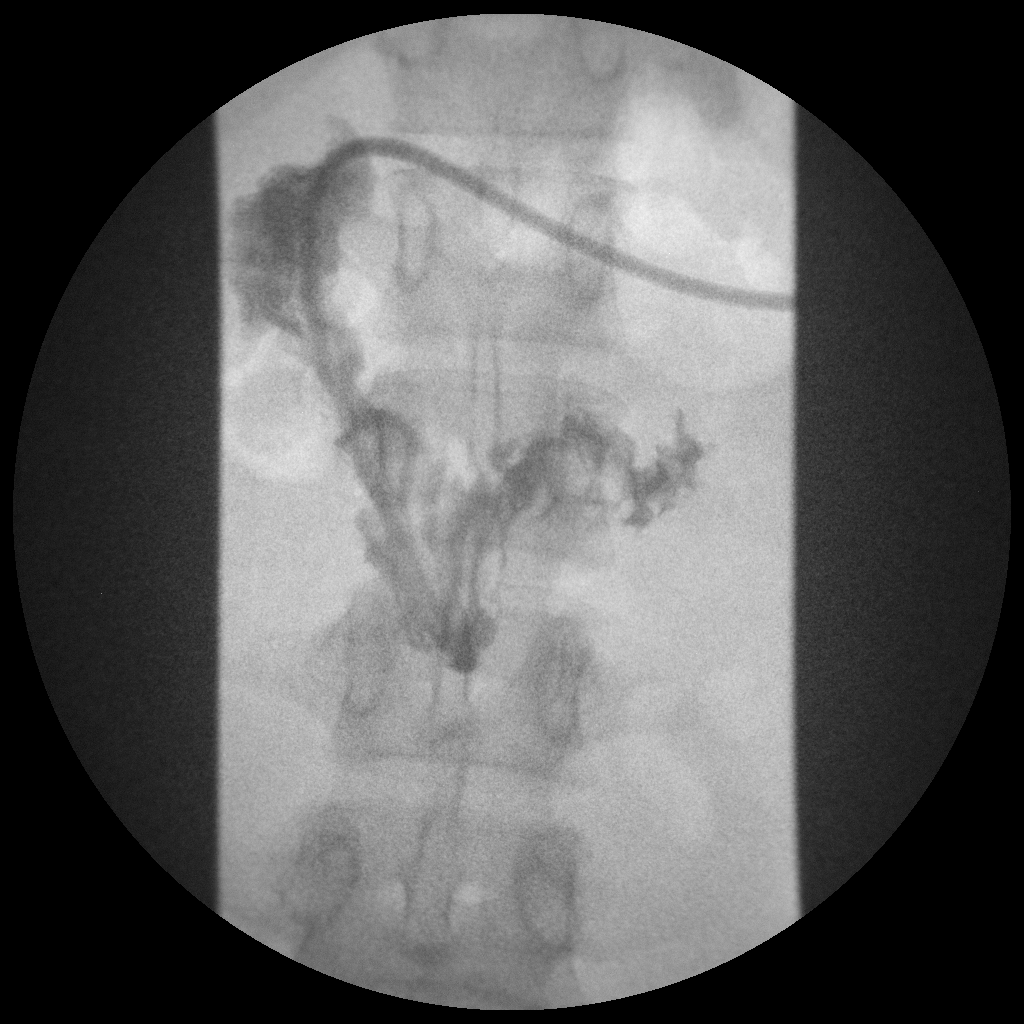
[im 2/2]
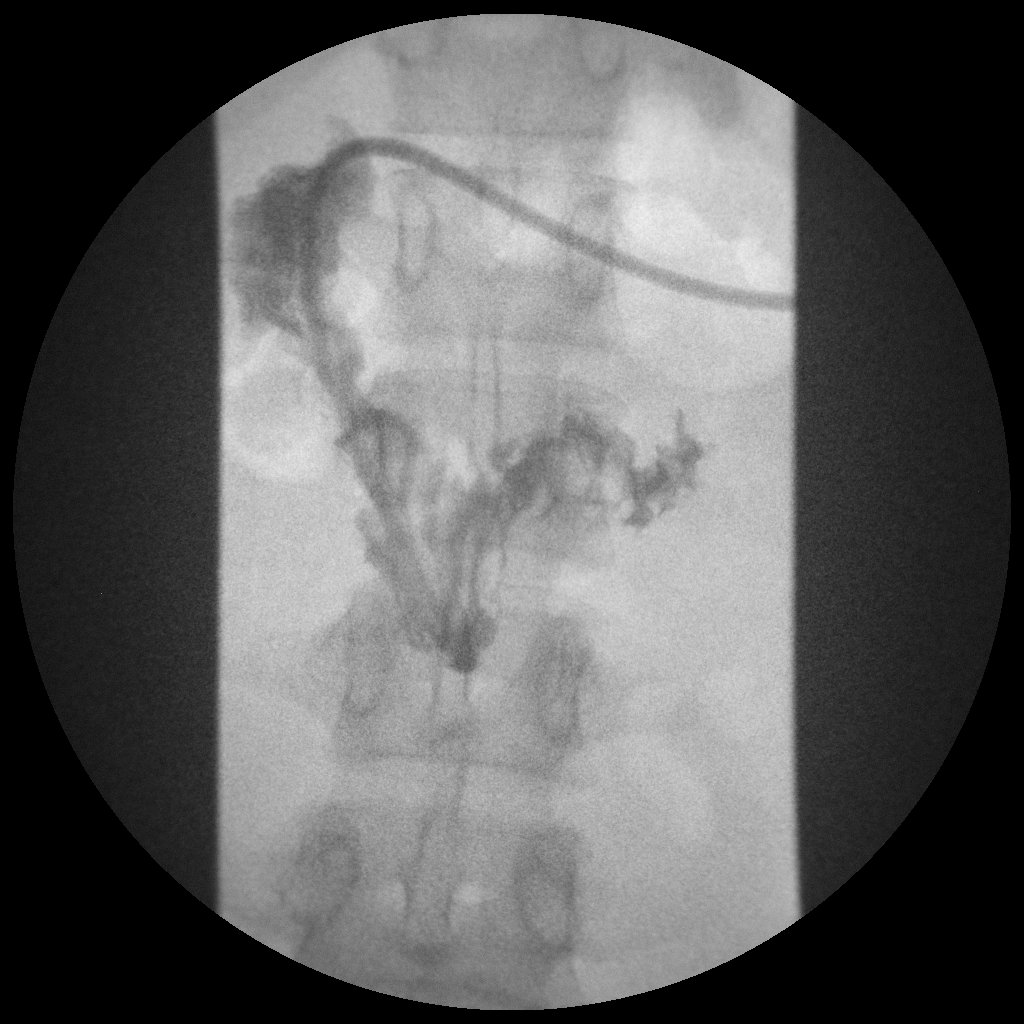

[2 of 2 positions shown; findings below may reference images not displayed]

FINDINGS: Contrast has been injected through the fluoroscopically placed
feeding tube. Contrast opacifies normal appearing loops of duodenum,
consistent with tube location in the LOWER descending portion of the
duodenal. No evidence for leak.
IMPRESSION: Placement of feeding tube within the LOWER descending duodenal. Tube
is ready for use.

## 2019-09-16 ENCOUNTER — Encounter (HOSPITAL_COMMUNITY): Payer: Self-pay

## 2019-09-16 ENCOUNTER — Emergency Department (HOSPITAL_COMMUNITY)
Admission: EM | Admit: 2019-09-16 | Discharge: 2019-09-17 | Disposition: A | Discharge status: Home / Self Care | Payer: Medicaid Other | Attending: Emergency Medicine | Admitting: Emergency Medicine

## 2019-09-16 ENCOUNTER — Other Ambulatory Visit: Payer: Self-pay

## 2019-09-16 DIAGNOSIS — R102 Pelvic and perineal pain: Secondary | ICD-10-CM | POA: Diagnosis not present

## 2019-09-16 DIAGNOSIS — Z79899 Other long term (current) drug therapy: Secondary | ICD-10-CM | POA: Diagnosis not present

## 2019-09-16 DIAGNOSIS — Z3A01 Less than 8 weeks gestation of pregnancy: Secondary | ICD-10-CM | POA: Insufficient documentation

## 2019-09-16 DIAGNOSIS — R109 Unspecified abdominal pain: Secondary | ICD-10-CM | POA: Diagnosis not present

## 2019-09-16 DIAGNOSIS — O208 Other hemorrhage in early pregnancy: Secondary | ICD-10-CM | POA: Diagnosis not present

## 2019-09-16 DIAGNOSIS — S3991XA Unspecified injury of abdomen, initial encounter: Secondary | ICD-10-CM | POA: Diagnosis not present

## 2019-09-16 DIAGNOSIS — O9A211 Injury, poisoning and certain other consequences of external causes complicating pregnancy, first trimester: Secondary | ICD-10-CM | POA: Insufficient documentation
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
## 2019-09-17 ENCOUNTER — Emergency Department (HOSPITAL_COMMUNITY): Payer: Medicaid Other

## 2019-09-17 DIAGNOSIS — O208 Other hemorrhage in early pregnancy: Secondary | ICD-10-CM | POA: Diagnosis not present

## 2019-09-17 DIAGNOSIS — Z3A01 Less than 8 weeks gestation of pregnancy: Secondary | ICD-10-CM | POA: Diagnosis not present

## 2019-09-17 DIAGNOSIS — R109 Unspecified abdominal pain: Secondary | ICD-10-CM | POA: Diagnosis not present

## 2019-09-17 DIAGNOSIS — S3991XA Unspecified injury of abdomen, initial encounter: Secondary | ICD-10-CM | POA: Diagnosis not present

## 2019-09-17 LAB — URINALYSIS, ROUTINE W REFLEX MICROSCOPIC
Bilirubin Urine: NEGATIVE
Glucose, UA: NEGATIVE mg/dL
Hgb urine dipstick: NEGATIVE
Ketones, ur: 20 mg/dL — AB
Nitrite: NEGATIVE
Protein, ur: 30 mg/dL — AB
Specific Gravity, Urine: 1.029 (ref 1.005–1.030)
pH: 5 (ref 5.0–8.0)

## 2019-09-17 LAB — POC URINE PREG, ED: Preg Test, Ur: POSITIVE — AB

## 2019-09-17 LAB — HCG, QUANTITATIVE, PREGNANCY: hCG, Beta Chain, Quant, S: 11169 m[IU]/mL — ABNORMAL HIGH (ref <5)

## 2019-09-17 MED ORDER — PRENATAL COMPLETE 14-0.4 MG PO TABS: 1.0000 | ORAL_TABLET | Freq: Every day | ORAL | 0 refills | Status: DC

## 2019-09-17 NOTE — Discharge Instructions (Signed)
Your ultrasound shows a pregnancy of 5 weeks and 5 days.  You should follow-up with your obstetric doctor.  Tylenol as needed for pain.  Return to the ED with worsening pain, vaginal bleeding, vomiting, any concerns.  You may use Tylenol for pain.

## 2019-09-18 LAB — URINE CULTURE: Culture: >=100000 — AB

## 2019-09-19 ENCOUNTER — Telehealth: Payer: Self-pay

## 2019-09-19 ENCOUNTER — Encounter: Payer: Self-pay | Admitting: *Deleted

## 2019-09-19 ENCOUNTER — Ambulatory Visit (INDEPENDENT_AMBULATORY_CARE_PROVIDER_SITE_OTHER): Payer: Medicaid Other | Admitting: *Deleted

## 2019-09-19 VITALS — BP 119/73 | HR 91 | Ht 68.0 in | Wt 161.0 lb

## 2019-09-19 DIAGNOSIS — Z3201 Encounter for pregnancy test, result positive: Secondary | ICD-10-CM | POA: Diagnosis not present

## 2019-09-19 LAB — POCT URINE PREGNANCY: Preg Test, Ur: POSITIVE — AB

## 2019-09-19 NOTE — Telephone Encounter (Signed)
Post ED Visit - Positive Culture Follow-up: Unsuccessful Patient Follow-up  Culture assessed and recommendations reviewed by:  []  , Pharm.D. []  Enzo Bi, Pharm.D., BCPS AQ-ID []  , Pharm.D., BCPS []  Celedonio Miyamoto, Pharm.D., BCPS []  [place], Garvin Fila.D., BCPS, AAHIVP []  , Pharm.D., BCPS, AAHIVP []  Georgina Pillion, PharmD []  , PharmD, BCPS [place] Pharm D Positive urine culture  [x]  Patient discharged without antimicrobial prescription and treatment is now indicated []  Organism is resistant to prescribed ED discharge antimicrobial []  Patient with positive blood cultures  Needs amoxicillin 500 mg BID x 5 days per [address] [place]  Unable to contact patient after 3 attempts, letter will be sent to address on file  09/19/2019, 12:15 PM

## 2019-09-19 NOTE — Progress Notes (Signed)
Chart reviewed for nurse visit. Agree with plan of care.  Adline Potter, NP 09/19/2019 12:02 PM

## 2019-09-19 NOTE — Progress Notes (Signed)
   NURSE VISIT- PREGNANCY CONFIRMATION   SUBJECTIVE:  Deborah Evans is a 24 y.o. G10P1011 female at [redacted]w[redacted]d by certain LMP of Patient's last menstrual period was 08/09/2019. Here for pregnancy confirmation.  Home pregnancy test: positive x 3  She reports no complaints.  She is taking prenatal gummies.    OBJECTIVE:  BP 119/73 (BP Location: Left Arm, Patient Position: Sitting, Cuff Size: Normal)   Pulse 91   Ht 5\' 8"  (1.727 m)   Wt 161 lb (73 kg)   LMP 08/09/2019   BMI 24.48 kg/m   Appears well, in no apparent distress OB History  Gravida Para Term Preterm AB Living  3 1 1   1 1   SAB TAB Ectopic Multiple Live Births  1     0 1    # Outcome Date GA Lbr Len/2nd Weight Sex Delivery Anes PTL Lv  3 Current           2 Term 04/25/18 [redacted]w[redacted]d 13:50 / 00:28 6 lb 9.1 oz (2.98 kg) F Vag-Spont EPI  LIV  1 SAB 06/2014 [redacted]w[redacted]d           Results for orders placed or performed in visit on 09/19/19 (from the past 24 hour(s))  POCT urine pregnancy   Collection Time: 09/19/19 11:07 AM  Result Value Ref Range   Preg Test, Ur Positive (A) Negative    ASSESSMENT: Positive pregnancy test, [redacted]w[redacted]d by LMP    PLAN: Schedule for dating ultrasound in 2 weeks Prenatal gummies: continue   Nausea medicines: not currently needed   OB packet given: Yes  09/21/19  09/19/2019 11:11 AM

## 2019-09-19 NOTE — Progress Notes (Signed)
ED Antimicrobial Stewardship Positive Culture Follow Up   Deborah Evans is an 24 y.o. female who presented to [facility] on 09/16/2019 with a chief complaint of  Chief Complaint  Patient presents with  . Optician, dispensing    Recent Results (from the past 720 hour(s))  Urine Culture     Status: Abnormal   Collection Time: 09/17/19  3:45 AM   Specimen: Urine, Clean Catch  Result Value Ref Range Status   Specimen Description   Final    URINE, CLEAN CATCH Performed at [facility], [address] [place], [place] [address]    Special Requests   Final    NONE Performed at [facility], [address], [place], [place] [address]    Culture >=100,000 COLONIES/mL PROTEUS MIRABILIS (A)  Final   Report Status 09/18/2019 FINAL  Final   Organism ID, Bacteria PROTEUS MIRABILIS (A)  Final      Susceptibility   Proteus mirabilis - MIC*    AMPICILLIN <=2 SENSITIVE Sensitive     CEFAZOLIN <=4 SENSITIVE Sensitive     CEFTRIAXONE <=1 SENSITIVE Sensitive     CIPROFLOXACIN <=0.25 SENSITIVE Sensitive     GENTAMICIN <=1 SENSITIVE Sensitive     IMIPENEM 2 SENSITIVE Sensitive     NITROFURANTOIN 128 RESISTANT Resistant     TRIMETH/SULFA <=20 SENSITIVE Sensitive     AMPICILLIN/SULBACTAM <=2 SENSITIVE Sensitive     PIP/TAZO <=4 SENSITIVE Sensitive     * >=100,000 COLONIES/mL PROTEUS MIRABILIS   [x]  Patient discharged originally without antimicrobial agent and treatment is now indicated  New antibiotic prescription: amoxicillin 500 mg po x 5 days  ED Provider: , PA-C  Dietrich Pates, Pharm.D 09/19/2019 10:32 AM Clinical Pharmacist [phone]

## 2019-10-01 ENCOUNTER — Other Ambulatory Visit: Payer: Self-pay | Admitting: Obstetrics & Gynecology

## 2019-10-01 DIAGNOSIS — O3680X0 Pregnancy with inconclusive fetal viability, not applicable or unspecified: Secondary | ICD-10-CM

## 2019-10-02 ENCOUNTER — Other Ambulatory Visit: Payer: Medicaid Other

## 2019-10-30 ENCOUNTER — Other Ambulatory Visit: Payer: Self-pay | Admitting: Obstetrics & Gynecology

## 2019-10-30 ENCOUNTER — Encounter: Payer: Self-pay | Admitting: *Deleted

## 2019-10-30 DIAGNOSIS — Z3682 Encounter for antenatal screening for nuchal translucency: Secondary | ICD-10-CM

## 2019-10-30 DIAGNOSIS — Z3490 Encounter for supervision of normal pregnancy, unspecified, unspecified trimester: Secondary | ICD-10-CM | POA: Insufficient documentation

## 2019-11-04 ENCOUNTER — Other Ambulatory Visit: Payer: Medicaid Other

## 2019-11-04 ENCOUNTER — Ambulatory Visit: Payer: Medicaid Other | Admitting: *Deleted

## 2019-11-04 ENCOUNTER — Encounter: Payer: Medicaid Other | Admitting: Women's Health

## 2020-01-27 ENCOUNTER — Emergency Department (HOSPITAL_COMMUNITY)
Admission: EM | Admit: 2020-01-27 | Discharge: 2020-01-27 | Disposition: A | Discharge status: Home / Self Care | Payer: BC Managed Care – PPO | Attending: Emergency Medicine | Admitting: Emergency Medicine

## 2020-01-27 ENCOUNTER — Encounter (HOSPITAL_COMMUNITY): Payer: Self-pay | Admitting: Emergency Medicine

## 2020-01-27 DIAGNOSIS — H9209 Otalgia, unspecified ear: Secondary | ICD-10-CM | POA: Diagnosis not present

## 2020-01-27 DIAGNOSIS — Z20822 Contact with and (suspected) exposure to covid-19: Secondary | ICD-10-CM | POA: Insufficient documentation

## 2020-01-27 DIAGNOSIS — J039 Acute tonsillitis, unspecified: Secondary | ICD-10-CM | POA: Diagnosis not present

## 2020-01-27 DIAGNOSIS — R070 Pain in throat: Secondary | ICD-10-CM | POA: Diagnosis present

## 2020-01-27 LAB — RESPIRATORY PANEL BY RT PCR (FLU A&B, COVID)
Influenza A by PCR: NEGATIVE
Influenza B by PCR: NEGATIVE
SARS Coronavirus 2 by RT PCR: NEGATIVE

## 2020-01-27 MED ORDER — CLINDAMYCIN HCL 300 MG PO CAPS: 300.0000 mg | ORAL_CAPSULE | Freq: Three times a day (TID) | ORAL | 0 refills | Status: AC

## 2020-01-27 NOTE — ED Notes (Signed)
AVS reviewed with pt, pt ambulatory out of department.

## 2020-01-27 NOTE — ED Triage Notes (Signed)
Pt reports sore throat, cough and feeling like her ears are stopped up x3 days. Denies any other symptoms, no recent sick contacts.

## 2020-01-27 NOTE — Discharge Instructions (Addendum)
Return if any problems.

## 2020-01-28 ENCOUNTER — Telehealth: Payer: Self-pay | Admitting: *Deleted

## 2020-01-28 NOTE — Telephone Encounter (Signed)
Transition Care Management Unsuccessful Follow-up Telephone Call  Date of discharge and from where:  [facility]/27/[facility]  Attempts:  1st Attempt  Reason for unsuccessful TCM follow-up call:  Left voice message

## 2020-01-29 ENCOUNTER — Telehealth: Payer: Self-pay | Admitting: *Deleted

## 2020-01-29 NOTE — Telephone Encounter (Signed)
Emailed request to [facility] to schedule  NP appointment .   Deborah [facility] PEC [phone]                                                                                       

## 2020-01-29 NOTE — Telephone Encounter (Signed)
Transition Care Management Unsuccessful Follow-up Telephone Call  Date of discharge and from where:  01/27/20 [facility]  Attempts:  2nd Attempt  Reason for unsuccessful TCM follow-up call:  Left voice message

## 2020-01-29 NOTE — ED Provider Notes (Signed)
[facility] EMERGENCY DEPARTMENT Provider Note   CSN: [id number] Arrival date & time: 01/27/20  6578     History Chief Complaint  Patient presents with  . Sore Throat  . Otalgia  . Cough    Deborah [facility] is a 24 y.o. female.  The history is provided by the patient. No language interpreter was used.  Sore Throat This is a new problem. The problem occurs constantly. The problem has been gradually worsening. Nothing aggravates the symptoms. She has tried nothing for the symptoms. The treatment provided no relief.  Otalgia Associated symptoms: cough and sore throat   Cough Associated symptoms: ear pain and sore throat        Past Medical History:  Diagnosis Date  . Hyperemesis gravidarum     Patient Active Problem List   Diagnosis Date Noted  . Encounter for supervision of normal pregnancy, antepartum 10/30/2019  . History of hyperemesis gravidarum 05/28/2018  . Hypokalemia 11/28/2017    Past Surgical History:  Procedure Laterality Date  . DILATION AND CURETTAGE OF UTERUS N/A 07/15/2014   Procedure: SUCTION DILATATION AND CURETTAGE;  Surgeon: Lazaro Arms, MD;  Location: [facility];  Service: Gynecology;  Laterality: N/A;     OB History    Gravida  3   Para  1   Term  1   Preterm      AB  1   Living  1     SAB  1   TAB      Ectopic      Multiple  0   Live Births  1           Family History  Problem Relation Age of Onset  . Hypertension Maternal Grandmother   . ADD / ADHD Neg Hx   . Alcohol abuse Neg Hx   . Anxiety disorder Neg Hx   . Arthritis Neg Hx   . Birth defects Neg Hx   . Asthma Neg Hx   . Cancer Neg Hx   . COPD Neg Hx   . Depression Neg Hx   . Diabetes Neg Hx   . Drug abuse Neg Hx   . Early death Neg Hx   . Hearing loss Neg Hx   . Heart disease Neg Hx   . Hyperlipidemia Neg Hx   . Intellectual disability Neg Hx   . Kidney disease Neg Hx   . Learning disabilities Neg Hx   . Miscarriages / Stillbirths  Neg Hx   . Obesity Neg Hx   . Stroke Neg Hx   . Vision loss Neg Hx   . Varicose Veins Neg Hx     Social History   Tobacco Use  . Smoking status: Never Smoker  . Smokeless tobacco: Never Used  Vaping Use  . Vaping Use: Never used  Substance Use Topics  . Alcohol use: No  . Drug use: No    Home Medications Prior to Admission medications   Medication Sig Start Date End Date Taking? Authorizing Provider  clindamycin (CLEOCIN) 300 MG capsule Take 1 capsule (300 mg total) by mouth 3 (three) times daily for 10 days. 01/27/20 02/06/20  Elson Areas, PA-C  Prenatal MV-Min-FA-Omega-3 (PRENATAL GUMMIES/DHA & FA PO) Take 2 tablets by mouth daily.     [provider]    Allergies    Patient has no known allergies.  Review of Systems   Review of Systems  HENT: Positive for ear pain and sore throat.  Respiratory: Positive for cough.   All other systems reviewed and are negative.   Physical Exam Updated Vital Signs BP 117/70   Pulse 96   Temp 98.8 F (37.1 C) (Oral)   Resp 15   Ht 5' 7.5" (1.715 m)   Wt 73.5 kg   LMP 08/09/2019   SpO2 100%   BMI 25.00 kg/m   Physical Exam Vitals and nursing note reviewed.  Constitutional:      Appearance: She is normal weight.  HENT:     Mouth/Throat:     Mouth: Mucous membranes are moist.     Pharynx: Pharyngeal swelling and posterior oropharyngeal erythema present.     Tonsils: Tonsillar exudate present. 2+ on the right. 2+ on the left.  Cardiovascular:     Rate and Rhythm: Normal rate.  Abdominal:     Palpations: Abdomen is soft.  Musculoskeletal:     Cervical back: Normal range of motion.  Skin:    General: Skin is warm.  Neurological:     General: No focal deficit present.  Psychiatric:        Mood and Affect: Mood normal.     ED Results / Procedures / Treatments   Labs (all labs ordered are listed, but only abnormal results are displayed) Labs Reviewed  RESPIRATORY PANEL BY RT PCR (FLU A&B, COVID)     EKG None  Radiology No results found.  Procedures Procedures (including critical care time)  Medications Ordered in ED Medications - No data to display  ED Course  I have reviewed the triage vital signs and the nursing notes.  Pertinent labs & imaging results that were available during my care of the patient were reviewed by me and considered in my medical decision making (see chart for details).    MDM Rules/Calculators/A&P                          MDM:  Pt given rx for clindamycin covid and influenza are negative.   Final Clinical Impression(s) / ED Diagnoses Final diagnoses:  Tonsillitis    Rx / DC Orders ED Discharge Orders         Ordered    clindamycin (CLEOCIN) 300 MG capsule  3 times daily        01/27/20 1557          Discharge Instructions     Return if any problems.       Elson Areas, [place] 01/29/20 9147    Margarita Grizzle, MD 01/30/20 1323

## 2020-01-30 NOTE — Telephone Encounter (Signed)
Transition Care Management Unsuccessful Follow-up Telephone Call  Date of discharge and from where:  01/27/20 - [facility]  Attempts:  3rd Attempt  Reason for unsuccessful TCM follow-up call:  Voice mail full

## 2020-05-01 NOTE — L&D Delivery Note (Signed)
Pt complained of increasing pressure in vaginal area at 0510. Dr. Ephriam Jenkins notified at [phone]. With next contraction, baby was delivered with no signs of life. Dr. Despina Hidden notified, MDs at bedside  for delivery of placenta.

## 2020-05-01 NOTE — L&D Delivery Note (Signed)
Delivered spontaneously and precipitously at 0515 Apgars 0/0/0 No signs of life Placenta delivered with the fetus, I had to manually remove it from the vaginabut it was completed detached from the uterus at delivery of fetus Uterus firm Methergine and pitocin given BP acceptable\ Placenta was unusually large for the gestation at least raising the possibility that this could be a partial molar pregnancy, given the entire clinical picture, will do quantitative HCG with her noon labs as well  EBL 250 cc  Fetus and placenta to pathology  Lazaro Arms, MD 12/11/2020 5:45 AM

## 2020-07-27 ENCOUNTER — Emergency Department (HOSPITAL_COMMUNITY)
Admission: EM | Admit: 2020-07-27 | Discharge: 2020-07-27 | Disposition: A | Discharge status: Home / Self Care | Payer: BC Managed Care – PPO | Attending: Emergency Medicine | Admitting: Emergency Medicine

## 2020-07-27 ENCOUNTER — Emergency Department (HOSPITAL_COMMUNITY): Payer: BC Managed Care – PPO

## 2020-07-27 ENCOUNTER — Encounter (HOSPITAL_COMMUNITY): Payer: Self-pay | Admitting: *Deleted

## 2020-07-27 ENCOUNTER — Other Ambulatory Visit: Payer: Self-pay

## 2020-07-27 DIAGNOSIS — S199XXA Unspecified injury of neck, initial encounter: Secondary | ICD-10-CM | POA: Diagnosis present

## 2020-07-27 DIAGNOSIS — X58XXXA Exposure to other specified factors, initial encounter: Secondary | ICD-10-CM | POA: Diagnosis not present

## 2020-07-27 DIAGNOSIS — M542 Cervicalgia: Secondary | ICD-10-CM | POA: Diagnosis not present

## 2020-07-27 DIAGNOSIS — S161XXA Strain of muscle, fascia and tendon at neck level, initial encounter: Secondary | ICD-10-CM | POA: Insufficient documentation

## 2020-07-27 MED ORDER — CYCLOBENZAPRINE HCL 10 MG PO TABS: 10.0000 mg | ORAL_TABLET | Freq: Three times a day (TID) | ORAL | 0 refills | Status: DC | PRN

## 2020-07-27 MED ORDER — CYCLOBENZAPRINE HCL 10 MG PO TABS
10.0000 mg | ORAL_TABLET | Freq: Once | ORAL | Status: AC
  Administered 2020-07-27: 10 mg via ORAL
  Filled 2020-07-27: qty 1

## 2020-07-27 MED ORDER — OXYCODONE-ACETAMINOPHEN 5-325 MG PO TABS: 1.0000 | ORAL_TABLET | Freq: Four times a day (QID) | ORAL | 0 refills | Status: DC | PRN

## 2020-07-27 MED ORDER — HYDROMORPHONE HCL 1 MG/ML IJ SOLN
1.0000 mg | Freq: Once | INTRAMUSCULAR | Status: AC
  Administered 2020-07-27: 1 mg via INTRAMUSCULAR
  Filled 2020-07-27: qty 1

## 2020-07-27 MED ORDER — OXYCODONE-ACETAMINOPHEN 5-325 MG PO TABS: 2.0000 | ORAL_TABLET | Freq: Four times a day (QID) | ORAL | 0 refills | Status: DC | PRN

## 2020-07-27 NOTE — ED Provider Notes (Signed)
[facility] EMERGENCY DEPARTMENT Provider Note   CSN: [id number] Arrival date & time: 07/27/20  1927     History Chief Complaint  Patient presents with  . Neck Pain    Debor[facility] is a 25 y.o. female.  Patient with neck pain.  No history of significant trauma.  No fever chills no URI symptoms   Neck Pain      Past Medical History:  Diagnosis Date  . Hyperemesis gravidarum     Patient Active Problem List   Diagnosis Date Noted  . Encounter for supervision of normal pregnancy, antepartum 10/30/2019  . History of hyperemesis gravidarum 05/28/2018  . Hypokalemia 11/28/2017    Past Surgical History:  Procedure Laterality Date  . DILATION AND CURETTAGE OF UTERUS N/A 07/15/2014   Procedure: SUCTION DILATATION AND CURETTAGE;  Surgeon: Lazaro Arms, MD;  Location: [facility];  Service: Gynecology;  Laterality: N/A;     OB History    Gravida  3   Para  1   Term  1   Preterm      AB  1   Living  1     SAB  1   IAB      Ectopic      Multiple  0   Live Births  1           Family History  Problem Relation Age of Onset  . Hypertension Maternal Grandmother   . ADD / ADHD Neg Hx   . Alcohol abuse Neg Hx   . Anxiety disorder Neg Hx   . Arthritis Neg Hx   . Birth defects Neg Hx   . Asthma Neg Hx   . Cancer Neg Hx   . COPD Neg Hx   . Depression Neg Hx   . Diabetes Neg Hx   . Drug abuse Neg Hx   . Early death Neg Hx   . Hearing loss Neg Hx   . Heart disease Neg Hx   . Hyperlipidemia Neg Hx   . Intellectual disability Neg Hx   . Kidney disease Neg Hx   . Learning disabilities Neg Hx   . Miscarriages / Stillbirths Neg Hx   . Obesity Neg Hx   . Stroke Neg Hx   . Vision loss Neg Hx   . Varicose Veins Neg Hx     Social History   Tobacco Use  . Smoking status: Never Smoker  . Smokeless tobacco: Never Used  Vaping Use  . Vaping Use: Never used  Substance Use Topics  . Alcohol use: No  . Drug use: No    Home Medications Prior to  Admission medications   Medication Sig Start Date End Date Taking? Authorizing Provider  cyclobenzaprine (FLEXERIL) 10 MG tablet Take 1 tablet (10 mg total) by mouth 3 (three) times daily as needed for muscle spasms. 07/27/20   Bethann Berkshire, MD  oxyCODONE-acetaminophen (PERCOCET) 5-325 MG tablet Take 1 tablet by mouth every 6 (six) hours as needed. 07/27/20   Bethann Berkshire, MD  Prenatal MV-Min-FA-Omega-3 (PRENATAL GUMMIES/DHA & FA PO) Take 2 tablets by mouth daily.     [provider]    Allergies    Patient has no known allergies.  Review of Systems   Review of Systems  Musculoskeletal: Positive for neck pain.    Physical Exam Updated Vital Signs BP 128/77   Pulse 78   Temp 98.2 F (36.8 C)   Resp 18   Ht 5\' 8"  (1.727 m)  Wt 71.7 kg   LMP 06/29/2020   SpO2 99%   Breastfeeding Unknown   BMI 24.02 kg/m   Physical Exam  ED Results / Procedures / Treatments   Labs (all labs ordered are listed, but only abnormal results are displayed) Labs Reviewed - No data to display  EKG None  Radiology DG Cervical Spine Complete  Result Date: 07/27/2020 CLINICAL DATA:  Neck pain EXAM: CERVICAL SPINE - COMPLETE 4+ VIEW COMPARISON:  None. FINDINGS: Mild reversal of the normal cervical lordosis. No acute fracture or listhesis of the cervical spine. Lateral masses of C1 are well aligned with the body of C2. Mild intervertebral disc space narrowing is seen at C4-5 in keeping with changes of mild degenerative disc disease. Remaining intervertebral disc heights are preserved. Vertebral body heights are preserved. Spinal canal is widely patent. Right neural foramina are widely patent. Left neural foramina are not well profiled. Prevertebral soft tissues are not thickened. IMPRESSION: Mild degenerative disc disease C4-5. Electronically Signed   By: Helyn Numbers MD   On: 07/27/2020 23:06    Procedures Procedures   Medications Ordered in ED Medications  HYDROmorphone (DILAUDID)  injection 1 mg (1 mg Intramuscular Given 07/27/20 2234)  cyclobenzaprine (FLEXERIL) tablet 10 mg (10 mg Oral Given 07/27/20 2334)    ED Course  I have reviewed the triage vital signs and the nursing notes.  Pertinent labs & imaging results that were available during my care of the patient were reviewed by me and considered in my medical decision making (see chart for details).    MDM Rules/Calculators/A&P                          Neck muscle strain.  Patient will be sent home with Flexeril Percocet and follow-up with orthopedics if necessary Final Clinical Impression(s) / ED Diagnoses Final diagnoses:  Neck pain    Rx / DC Orders ED Discharge Orders         Ordered    cyclobenzaprine (FLEXERIL) 10 MG tablet  3 times daily PRN,   Status:  Discontinued        07/27/20 2330    oxyCODONE-acetaminophen (PERCOCET) 5-325 MG tablet  Every 6 hours PRN,   Status:  Discontinued        07/27/20 2330    cyclobenzaprine (FLEXERIL) 10 MG tablet  3 times daily PRN        07/27/20 2331    oxyCODONE-acetaminophen (PERCOCET) 5-325 MG tablet  Every 6 hours PRN        07/27/20 2331           Bethann Berkshire, MD 08/01/20 1621

## 2020-07-27 NOTE — ED Triage Notes (Signed)
Pt with posterior neck pain off and on for a week, pain radiates into bilateral shoulders at times. Pt taking ibuprofen occasionally and took two prior to arrival.

## 2020-07-27 NOTE — Discharge Instructions (Addendum)
Follow-up with Dr. Romeo Apple in a week if not improving

## 2020-07-28 ENCOUNTER — Telehealth: Payer: Self-pay

## 2020-07-28 NOTE — Telephone Encounter (Signed)
Transition Care Management Unsuccessful Follow-up Telephone Call  Date of discharge and from where:  07/27/2020 from   Attempts:  1st Attempt  Reason for unsuccessful TCM follow-up call:  Left voice message    

## 2020-07-29 NOTE — Telephone Encounter (Signed)
Transition Care Management Follow-up Telephone Call  Date of discharge and from where: 07/27/2020 from [facility]  How have you been since you were released from the hospital? Pt stated that she is still having neck pain and the rx are not helping.   Any questions or concerns? No  Items Reviewed:  Did the pt receive and understand the discharge instructions provided? Yes   Medications obtained and verified? Yes   Other? No   Any new allergies since your discharge? No   Dietary orders reviewed? n/a  Do you have support at home? Yes   Functional Questionnaire: (I = Independent and D = Dependent) ADLs: I  Bathing/Dressing- I  Meal Prep- I  Eating- I  Maintaining continence- I  Transferring/Ambulation- I  Managing Meds- I   Follow up appointments reviewed:   PCP Hospital f/u appt confirmed? No    Specialist Hospital f/u appt confirmed? Yes  Pt is calling Ortho.   Are transportation arrangements needed? No   If their condition worsens, is the pt aware to call PCP or go to the Emergency Dept.? Yes  Was the patient provided with contact information for the PCP's office or ED? Yes  Was to pt encouraged to call back with questions or concerns? Yes

## 2020-09-20 ENCOUNTER — Other Ambulatory Visit: Payer: BC Managed Care – PPO

## 2020-10-09 ENCOUNTER — Other Ambulatory Visit: Payer: Self-pay

## 2020-10-09 ENCOUNTER — Encounter (HOSPITAL_COMMUNITY): Payer: Self-pay | Admitting: *Deleted

## 2020-10-09 ENCOUNTER — Inpatient Hospital Stay (HOSPITAL_COMMUNITY)
Admission: EM | Admit: 2020-10-09 | Discharge: 2020-10-09 | Disposition: A | Discharge status: Home / Self Care | Payer: BC Managed Care – PPO | Attending: Obstetrics and Gynecology | Admitting: Obstetrics and Gynecology

## 2020-10-09 DIAGNOSIS — O210 Mild hyperemesis gravidarum: Secondary | ICD-10-CM | POA: Insufficient documentation

## 2020-10-09 DIAGNOSIS — O219 Vomiting of pregnancy, unspecified: Secondary | ICD-10-CM | POA: Diagnosis present

## 2020-10-09 DIAGNOSIS — Z3A08 8 weeks gestation of pregnancy: Secondary | ICD-10-CM | POA: Insufficient documentation

## 2020-10-09 LAB — COMPREHENSIVE METABOLIC PANEL
ALT: 11 U/L (ref 0–44)
AST: 18 U/L (ref 15–41)
Albumin: 4.2 g/dL (ref 3.5–5.0)
Alkaline Phosphatase: 61 U/L (ref 38–126)
Anion gap: 11 (ref 5–15)
BUN: 12 mg/dL (ref 6–20)
CO2: 21 mmol/L — ABNORMAL LOW (ref 22–32)
Calcium: 9.6 mg/dL (ref 8.9–10.3)
Chloride: 103 mmol/L (ref 98–111)
Creatinine, Ser: 0.68 mg/dL (ref 0.44–1.00)
GFR, Estimated: >60 mL/min (ref >60)
Glucose, Bld: 83 mg/dL (ref 70–99)
Potassium: 4.2 mmol/L (ref 3.5–5.1)
Sodium: 135 mmol/L (ref 135–145)
Total Bilirubin: 1.9 mg/dL — ABNORMAL HIGH (ref 0.3–1.2)
Total Protein: 7.4 g/dL (ref 6.5–8.1)

## 2020-10-09 LAB — CBC
HCT: 38.8 % (ref 36.0–46.0)
Hemoglobin: 12.7 g/dL (ref 12.0–15.0)
MCH: 26.7 pg (ref 26.0–34.0)
MCHC: 32.7 g/dL (ref 30.0–36.0)
MCV: 81.5 fL (ref 80.0–100.0)
Platelets: 227 10*3/uL (ref 150–400)
RBC: 4.76 MIL/uL (ref 3.87–5.11)
RDW: 13 % (ref 11.5–15.5)
WBC: 5.8 10*3/uL (ref 4.0–10.5)
nRBC: 0 % (ref 0.0–0.2)

## 2020-10-09 LAB — URINALYSIS, ROUTINE W REFLEX MICROSCOPIC
Bilirubin Urine: NEGATIVE
Glucose, UA: NEGATIVE mg/dL
Hgb urine dipstick: NEGATIVE
Ketones, ur: 80 mg/dL — AB
Nitrite: NEGATIVE
Protein, ur: 30 mg/dL — AB
Specific Gravity, Urine: 1.027 (ref 1.005–1.030)
pH: 6 (ref 5.0–8.0)

## 2020-10-09 LAB — I-STAT BETA HCG BLOOD, ED (MC, WL, AP ONLY): I-stat hCG, quantitative: >2000 m[IU]/mL — ABNORMAL HIGH (ref <5)

## 2020-10-09 MED ORDER — FAMOTIDINE IN NACL 20-0.9 MG/50ML-% IV SOLN
20.0000 mg | Freq: Once | INTRAVENOUS | Status: AC
  Administered 2020-10-09: 20 mg via INTRAVENOUS
  Filled 2020-10-09: qty 50

## 2020-10-09 MED ORDER — LACTATED RINGERS IV BOLUS
1000.0000 mL | Freq: Once | INTRAVENOUS | Status: AC
  Administered 2020-10-09: 1000 mL via INTRAVENOUS

## 2020-10-09 MED ORDER — SODIUM CHLORIDE 0.9 % IV SOLN
25.0000 mg | Freq: Once | INTRAVENOUS | Status: AC
  Administered 2020-10-09: 25 mg via INTRAVENOUS
  Filled 2020-10-09: qty 1

## 2020-10-09 MED ORDER — DOXYLAMINE-PYRIDOXINE 10-10 MG PO TBEC: DELAYED_RELEASE_TABLET | ORAL | 0 refills | Status: DC

## 2020-10-09 MED ORDER — PROMETHAZINE HCL 25 MG PO TABS: 25.0000 mg | ORAL_TABLET | Freq: Four times a day (QID) | ORAL | 1 refills | Status: DC | PRN

## 2020-10-09 NOTE — MAU Provider Note (Signed)
History     CSN: [id number]  Arrival date and time: 10/09/20 0840   Event Date/Time   First Provider Initiated Contact with Patient 10/09/20 [phone]      Chief Complaint  Patient presents with   Emesis   HPI [facility] is a 25 y.o. G3P1011 at [redacted]w[redacted]d who presents with nausea & vomiting. Symptoms started 2 weeks ago. Has been vomiting 4-5 times per day until today. Reports vomiting 10+ times today. Does not have antiemetic at home & hasn't treated symptoms. Denies fever, abdominal pain, vaginal bleeding, or diarrhea. Plans on going to [facility].   OB History     Gravida  3   Para  1   Term  1   Preterm      AB  1   Living  1      SAB  1   IAB      Ectopic      Multiple  0   Live Births  1           Past Medical History:  Diagnosis Date   Hyperemesis gravidarum     Past Surgical History:  Procedure Laterality Date   DILATION AND CURETTAGE OF UTERUS N/A 07/15/2014   Procedure: SUCTION DILATATION AND CURETTAGE;  Surgeon: Lazaro Arms, MD;  Location: [facility];  Service: Gynecology;  Laterality: N/A;    Family History  Problem Relation Age of Onset   Hypertension Maternal Grandmother    ADD / ADHD Neg Hx    Alcohol abuse Neg Hx    Anxiety disorder Neg Hx    Arthritis Neg Hx    Birth defects Neg Hx    Asthma Neg Hx    Cancer Neg Hx    COPD Neg Hx    Depression Neg Hx    Diabetes Neg Hx    Drug abuse Neg Hx    Early death Neg Hx    Hearing loss Neg Hx    Heart disease Neg Hx    Hyperlipidemia Neg Hx    Intellectual disability Neg Hx    Kidney disease Neg Hx    Learning disabilities Neg Hx    Miscarriages / Stillbirths Neg Hx    Obesity Neg Hx    Stroke Neg Hx    Vision loss Neg Hx    Varicose Veins Neg Hx     Social History   Tobacco Use   Smoking status: Never   Smokeless tobacco: Never  Vaping Use   Vaping Use: Never used  Substance Use Topics   Alcohol use: No   Drug use: No    Allergies: No Known  Allergies  Medications Prior to Admission  Medication Sig Dispense Refill Last Dose   cyclobenzaprine (FLEXERIL) 10 MG tablet Take 1 tablet (10 mg total) by mouth 3 (three) times daily as needed for muscle spasms. 20 tablet 0    oxyCODONE-acetaminophen (PERCOCET) 5-325 MG tablet Take 1 tablet by mouth every 6 (six) hours as needed. 12 tablet 0    Prenatal MV-Min-FA-Omega-3 (PRENATAL GUMMIES/DHA & FA PO) Take 2 tablets by mouth daily.        Review of Systems Physical Exam   Blood pressure 122/67, pulse 72, temperature 98.2 F (36.8 C), temperature source Oral, resp. rate 15, height 5\' 8"  (1.727 m), weight 71.7 kg, last menstrual period 08/10/2020, SpO2 100 %, unknown if currently breastfeeding.  Physical Exam Vitals and nursing note reviewed.  Constitutional:  General: She is not in acute distress.    Appearance: Normal appearance. She is normal weight. She is not ill-appearing.  HENT:     Head: Normocephalic and atraumatic.  Eyes:     General: No scleral icterus. Pulmonary:     Effort: Pulmonary effort is normal. No respiratory distress.  Neurological:     Mental Status: She is alert.  Psychiatric:        Mood and Affect: Mood normal.        Behavior: Behavior normal.    MAU Course  Procedures Results for orders placed or performed during the hospital encounter of 10/09/20 (from the past 24 hour(s))  Comprehensive metabolic panel     Status: Abnormal   Collection Time: 10/09/20 12:43 AM  Result Value Ref Range   Sodium 135 135 - 145 mmol/L   Potassium 4.2 3.5 - 5.1 mmol/L   Chloride 103 98 - 111 mmol/L   CO2 21 (L) 22 - 32 mmol/L   Glucose, Bld 83 70 - 99 mg/dL   BUN 12 6 - 20 mg/dL   Creatinine, Ser 8.46 0.44 - 1.00 mg/dL   Calcium 9.6 8.9 - 96.2 mg/dL   Total Protein 7.4 6.5 - 8.1 g/dL   Albumin 4.2 3.5 - 5.0 g/dL   AST 18 15 - 41 U/L   ALT 11 0 - 44 U/L   Alkaline Phosphatase 61 38 - 126 U/L   Total Bilirubin 1.9 (H) 0.3 - 1.2 mg/dL   GFR, Estimated >95  >28 mL/min   Anion gap 11 5 - 15  CBC     Status: None   Collection Time: 10/09/20 12:43 AM  Result Value Ref Range   WBC 5.8 4.0 - 10.5 K/uL   RBC 4.76 3.87 - 5.11 MIL/uL   Hemoglobin 12.7 12.0 - 15.0 g/dL   HCT 41.3 24.4 - 01.0 %   MCV 81.5 80.0 - 100.0 fL   MCH 26.7 26.0 - 34.0 pg   MCHC 32.7 30.0 - 36.0 g/dL   RDW 27.2 53.6 - 64.4 %   Platelets 227 150 - 400 K/uL   nRBC 0.0 0.0 - 0.2 %  Urinalysis, Routine w reflex microscopic Urine, Clean Catch     Status: Abnormal   Collection Time: 10/09/20 12:43 AM  Result Value Ref Range   Color, Urine YELLOW YELLOW   APPearance HAZY (A) CLEAR   Specific Gravity, Urine 1.027 1.005 - 1.030   pH 6.0 5.0 - 8.0   Glucose, UA NEGATIVE NEGATIVE mg/dL   Hgb urine dipstick NEGATIVE NEGATIVE   Bilirubin Urine NEGATIVE NEGATIVE   Ketones, ur 80 (A) NEGATIVE mg/dL   Protein, ur 30 (A) NEGATIVE mg/dL   Nitrite NEGATIVE NEGATIVE   Leukocytes,Ua SMALL (A) NEGATIVE   RBC / HPF 0-5 0 - 5 RBC/hpf   WBC, UA 21-50 0 - 5 WBC/hpf   Bacteria, UA RARE (A) NONE SEEN   Squamous Epithelial / LPF 0-5 0 - 5   Mucus PRESENT   I-Stat beta hCG blood, ED     Status: Abnormal   Collection Time: 10/09/20  1:32 AM  Result Value Ref Range   I-stat hCG, quantitative >2,000.0 (H) <5 mIU/mL   Comment 3            MDM Patient presents with n/v. No abdominal pain or vaginal bleeding. Given LR bolus, phenergan, & pepcid. Reports improvement in symptoms & no vomiting in MAU. Will rx antiemetics for home use  Assessment and Plan  1. Nausea and vomiting during pregnancy prior to [redacted] weeks gestation   2. [redacted] weeks gestation of pregnancy    -rx diclegis & phenergan -call [facility] to schedule prenatal care -reviewed reasons to return to MAU  Judeth Horn 10/09/2020, 9:47 AM

## 2020-10-09 NOTE — ED Triage Notes (Signed)
The pt has been vomiting for one week  she is pregnant  lmp April 12th

## 2020-10-09 NOTE — MAU Note (Signed)
Pt reports to [facility] from [facility] with c/o nausea and vomiting for the past 2 weeks that worsened yesterday.  Denies vag bleeding or dc.  Denies any pain at this time.

## 2020-10-09 NOTE — ED Provider Notes (Signed)
[facility] EMERGENCY DEPARTMENT Provider Note   CSN: [id number] Arrival date & time: 10/08/20  2328     History Chief Complaint  Patient presents with   Emesis    Deborah Evans is a 25 y.o. female.  HPI  25 year old female G2, P1 LMP in April presents today complaining of ongoing nausea and vomiting.  She has a history of hyperemesis gravidarum.  She states she has vomited multiple times until 4 AM today.  During her first pregnancy she did use Zofran but states that it was not very good at controlling her symptoms.  Denies any fever, headache, chills, diarrhea, UTI symptoms, or URI symptoms.  [facility] [place]     Past Medical History:  Diagnosis Date   Hyperemesis gravidarum     Patient Active Problem List   Diagnosis Date Noted   Encounter for supervision of normal pregnancy, antepartum 10/30/2019   History of hyperemesis gravidarum 05/28/2018   Hypokalemia 11/28/2017    Past Surgical History:  Procedure Laterality Date   DILATION AND CURETTAGE OF UTERUS N/A 07/15/2014   Procedure: SUCTION DILATATION AND CURETTAGE;  Surgeon: Lazaro Arms, MD;  Location: [facility];  Service: Gynecology;  Laterality: N/A;     OB History     Gravida  3   Para  1   Term  1   Preterm      AB  1   Living  1      SAB  1   IAB      Ectopic      Multiple  0   Live Births  1           Family History  Problem Relation Age of Onset   Hypertension Maternal Grandmother    ADD / ADHD Neg Hx    Alcohol abuse Neg Hx    Anxiety disorder Neg Hx    Arthritis Neg Hx    Birth defects Neg Hx    Asthma Neg Hx    Cancer Neg Hx    COPD Neg Hx    Depression Neg Hx    Diabetes Neg Hx    Drug abuse Neg Hx    Early death Neg Hx    Hearing loss Neg Hx    Heart disease Neg Hx    Hyperlipidemia Neg Hx    Intellectual disability Neg Hx    Kidney disease Neg Hx    Learning disabilities Neg Hx    Miscarriages / Stillbirths Neg Hx     Obesity Neg Hx    Stroke Neg Hx    Vision loss Neg Hx    Varicose Veins Neg Hx     Social History   Tobacco Use   Smoking status: Never   Smokeless tobacco: Never  Vaping Use   Vaping Use: Never used  Substance Use Topics   Alcohol use: No   Drug use: No    Home Medications Prior to Admission medications   Medication Sig Start Date End Date Taking? Authorizing Provider  cyclobenzaprine (FLEXERIL) 10 MG tablet Take 1 tablet (10 mg total) by mouth 3 (three) times daily as needed for muscle spasms. 07/27/20   Bethann Berkshire, MD  oxyCODONE-acetaminophen (PERCOCET) 5-325 MG tablet Take 1 tablet by mouth every 6 (six) hours as needed. 07/27/20   Bethann Berkshire, MD  Prenatal MV-Min-FA-Omega-3 (PRENATAL GUMMIES/DHA & FA PO) Take 2 tablets by mouth daily.     [provider]  Allergies    Patient has no known allergies.  Review of Systems   Review of Systems  All other systems reviewed and are negative.  Physical Exam Updated Vital Signs BP (!) 109/58 (BP Location: Right Arm)   Pulse 78   Temp 98.6 F (37 C) (Oral)   Resp 17   Ht 1.727 m (5\' 8" )   Wt 71.7 kg   LMP 07/30/2020   SpO2 100%   BMI 24.03 kg/m   Physical Exam Vitals and nursing note reviewed.  Constitutional:      Appearance: Normal appearance.  HENT:     Head: Normocephalic.     Right Ear: External ear normal.     Left Ear: External ear normal.     Nose: Nose normal.     Mouth/Throat:     Pharynx: Oropharynx is clear.  Eyes:     Pupils: Pupils are equal, round, and reactive to light.  Cardiovascular:     Rate and Rhythm: Normal rate and regular rhythm.  Pulmonary:     Effort: Pulmonary effort is normal.  Abdominal:     General: Abdomen is flat.     Palpations: Abdomen is soft.  Musculoskeletal:        General: Normal range of motion.     Cervical back: Normal range of motion.  Skin:    General: Skin is warm and dry.     Capillary Refill: Capillary refill takes less than 2 seconds.   Neurological:     General: No focal deficit present.     Mental Status: She is alert.  Psychiatric:        Mood and Affect: Mood normal.    ED Results / Procedures / Treatments   Labs (all labs ordered are listed, but only abnormal results are displayed) Labs Reviewed  COMPREHENSIVE METABOLIC PANEL - Abnormal; Notable for the following components:      Result Value   CO2 21 (*)    Total Bilirubin 1.9 (*)    All other components within normal limits  URINALYSIS, ROUTINE W REFLEX MICROSCOPIC - Abnormal; Notable for the following components:   APPearance HAZY (*)    Ketones, ur 80 (*)    Protein, ur 30 (*)    Leukocytes,Ua SMALL (*)    Bacteria, UA RARE (*)    All other components within normal limits  I-STAT BETA HCG BLOOD, ED ([facility], [facility], [facility] ONLY) - Abnormal; Notable for the following components:   I-stat hCG, quantitative >2,000.0 (*)    All other components within normal limits  CBC    EKG None  Radiology No results found.  Procedures Procedures   Medications Ordered in ED Medications - No data to display  ED Course  I have reviewed the triage vital signs and the nursing notes.  Pertinent labs & imaging results that were available during my care of the patient were reviewed by me and considered in my medical decision making (see chart for details).    MDM Rules/Calculators/A&P                          Discussed care with Erin and MAU and patient to be transferred to MAU for further treatment. Urinalysis reviewed and has rare bacteria, 21-50 white blood cells, and is ketone positive.  Given patient's history, feel that she would benefit from transfer to MAU for hydration and antiemetics with and possible treatment for UTI. Final Clinical Impression(s) / ED Diagnoses Final diagnoses:  Hyperemesis gravidarum    Rx / DC Orders ED Discharge Orders     None        Margarita Grizzle, MD 10/09/20 [phone]

## 2020-10-09 NOTE — ED Notes (Signed)
Pt requested and given graham crackers and peanut butter, OJ, and a second [place] sandwich bag.

## 2020-10-29 ENCOUNTER — Encounter (HOSPITAL_BASED_OUTPATIENT_CLINIC_OR_DEPARTMENT_OTHER): Payer: Self-pay | Admitting: Emergency Medicine

## 2020-10-29 ENCOUNTER — Emergency Department (HOSPITAL_BASED_OUTPATIENT_CLINIC_OR_DEPARTMENT_OTHER)
Admission: EM | Admit: 2020-10-29 | Discharge: 2020-10-29 | Disposition: A | Discharge status: Home / Self Care | Payer: Medicaid Other | Attending: Emergency Medicine | Admitting: Emergency Medicine

## 2020-10-29 DIAGNOSIS — O219 Vomiting of pregnancy, unspecified: Secondary | ICD-10-CM | POA: Diagnosis not present

## 2020-10-29 DIAGNOSIS — R1111 Vomiting without nausea: Secondary | ICD-10-CM | POA: Diagnosis not present

## 2020-10-29 DIAGNOSIS — Z3A12 12 weeks gestation of pregnancy: Secondary | ICD-10-CM | POA: Insufficient documentation

## 2020-10-29 DIAGNOSIS — M549 Dorsalgia, unspecified: Secondary | ICD-10-CM | POA: Diagnosis not present

## 2020-10-29 LAB — CBC
HCT: 39.9 % (ref 36.0–46.0)
Hemoglobin: 13.1 g/dL (ref 12.0–15.0)
MCH: 26.2 pg (ref 26.0–34.0)
MCHC: 32.8 g/dL (ref 30.0–36.0)
MCV: 79.8 fL — ABNORMAL LOW (ref 80.0–100.0)
Platelets: 247 10*3/uL (ref 150–400)
RBC: 5 MIL/uL (ref 3.87–5.11)
RDW: 13.6 % (ref 11.5–15.5)
WBC: 6.9 10*3/uL (ref 4.0–10.5)
nRBC: 0 % (ref 0.0–0.2)

## 2020-10-29 LAB — COMPREHENSIVE METABOLIC PANEL
ALT: 38 U/L (ref 0–44)
AST: 30 U/L (ref 15–41)
Albumin: 4.8 g/dL (ref 3.5–5.0)
Alkaline Phosphatase: 59 U/L (ref 38–126)
Anion gap: 12 (ref 5–15)
BUN: 18 mg/dL (ref 6–20)
CO2: 24 mmol/L (ref 22–32)
Calcium: 10.2 mg/dL (ref 8.9–10.3)
Chloride: 99 mmol/L (ref 98–111)
Creatinine, Ser: 0.61 mg/dL (ref 0.44–1.00)
GFR, Estimated: >60 mL/min (ref >60)
Glucose, Bld: 100 mg/dL — ABNORMAL HIGH (ref 70–99)
Potassium: 4 mmol/L (ref 3.5–5.1)
Sodium: 135 mmol/L (ref 135–145)
Total Bilirubin: 1.8 mg/dL — ABNORMAL HIGH (ref 0.3–1.2)
Total Protein: 8.4 g/dL — ABNORMAL HIGH (ref 6.5–8.1)

## 2020-10-29 LAB — LIPASE, BLOOD: Lipase: 34 U/L (ref 11–51)

## 2020-10-29 MED ORDER — PROMETHAZINE HCL 25 MG RE SUPP: 25.0000 mg | Freq: Four times a day (QID) | RECTAL | 0 refills | Status: DC | PRN

## 2020-10-29 MED ORDER — PROMETHAZINE HCL 25 MG/ML IJ SOLN
25.0000 mg | Freq: Four times a day (QID) | INTRAMUSCULAR | Status: DC | PRN
  Administered 2020-10-29: 25 mg via INTRAMUSCULAR
  Filled 2020-10-29: qty 1

## 2020-10-29 NOTE — ED Provider Notes (Signed)
[facility] EMERGENCY DEPT Provider Note   CSN: [id number] Arrival date & time: 10/29/20  1417     History Chief Complaint  Patient presents with   Emesis During Pregnancy    Deborah Evans is a 25 y.o. female.  Patient presents with multiple episodes of vomiting for the past 3 days.  She denies fevers or cough.  Denies diarrhea.  Denies any abdominal pain or vaginal bleeding or discharge.  She is otherwise [redacted] weeks pregnant awaiting her OB/GYN appointment.  Describes the vomitus is nonbloody nonbilious.      Past Medical History:  Diagnosis Date   Hyperemesis gravidarum     Patient Active Problem List   Diagnosis Date Noted   Encounter for supervision of normal pregnancy, antepartum 10/30/2019   History of hyperemesis gravidarum 05/28/2018   Hypokalemia 11/28/2017    Past Surgical History:  Procedure Laterality Date   DILATION AND CURETTAGE OF UTERUS N/A 07/15/2014   Procedure: SUCTION DILATATION AND CURETTAGE;  Surgeon: Lazaro Arms, MD;  Location: [facility];  Service: Gynecology;  Laterality: N/A;     OB History     Gravida  3   Para  1   Term  1   Preterm      AB  1   Living  1      SAB  1   IAB      Ectopic      Multiple  0   Live Births  1           Family History  Problem Relation Age of Onset   Hypertension Maternal Grandmother    ADD / ADHD Neg Hx    Alcohol abuse Neg Hx    Anxiety disorder Neg Hx    Arthritis Neg Hx    Birth defects Neg Hx    Asthma Neg Hx    Cancer Neg Hx    COPD Neg Hx    Depression Neg Hx    Diabetes Neg Hx    Drug abuse Neg Hx    Early death Neg Hx    Hearing loss Neg Hx    Heart disease Neg Hx    Hyperlipidemia Neg Hx    Intellectual disability Neg Hx    Kidney disease Neg Hx    Learning disabilities Neg Hx    Miscarriages / Stillbirths Neg Hx    Obesity Neg Hx    Stroke Neg Hx    Vision loss Neg Hx    Varicose Veins Neg Hx     Social History   Tobacco Use   Smoking status:  Never   Smokeless tobacco: Never  Vaping Use   Vaping Use: Never used  Substance Use Topics   Alcohol use: No   Drug use: No    Home Medications Prior to Admission medications   Medication Sig Start Date End Date Taking? Authorizing Provider  promethazine (PHENERGAN) 25 MG suppository Place 1 suppository (25 mg total) rectally every 6 (six) hours as needed for nausea or vomiting. 10/29/20  Yes Tatiyanna Lashley, Eustace Moore, MD  cyclobenzaprine (FLEXERIL) 10 MG tablet Take 1 tablet (10 mg total) by mouth 3 (three) times daily as needed for muscle spasms. 07/27/20   Bethann Berkshire, MD  Doxylamine-Pyridoxine 10-10 MG TBEC Take 2 tabs at bedtime. If needed, add another tab in the morning. If needed, add another tab in the afternoon, up to 4 tabs/day. 10/09/20   Judeth Horn, NP  oxyCODONE-acetaminophen (PERCOCET) 5-325 MG tablet Take 1 tablet  by mouth every 6 (six) hours as needed. 07/27/20   Bethann Berkshire, MD  Prenatal MV-Min-FA-Omega-3 (PRENATAL GUMMIES/DHA & FA PO) Take 2 tablets by mouth daily.     [provider]    Allergies    Patient has no known allergies.  Review of Systems   Review of Systems  Constitutional:  Negative for fever.  HENT:  Negative for ear pain.   Eyes:  Negative for pain.  Respiratory:  Negative for cough.   Cardiovascular:  Negative for chest pain.  Gastrointestinal:  Negative for abdominal pain.  Genitourinary:  Negative for flank pain.  Musculoskeletal:  Negative for back pain.  Skin:  Negative for rash.  Neurological:  Negative for headaches.   Physical Exam Updated Vital Signs BP 121/66 (BP Location: Right Arm)   Pulse 85   Temp 98.5 F (36.9 C) (Oral)   Resp 15   Ht 5\' 8"  (1.727 m)   Wt 71.7 kg   LMP 08/10/2020 (Exact Date)   SpO2 100%   BMI 24.02 kg/m   Physical Exam Constitutional:      General: She is not in acute distress.    Appearance: Normal appearance.  HENT:     Head: Normocephalic.     Nose: Nose normal.  Eyes:     Extraocular  Movements: Extraocular movements intact.  Cardiovascular:     Rate and Rhythm: Normal rate.  Pulmonary:     Effort: Pulmonary effort is normal.  Musculoskeletal:        General: Normal range of motion.     Cervical back: Normal range of motion.  Neurological:     General: No focal deficit present.     Mental Status: She is alert. Mental status is at baseline.    ED Results / Procedures / Treatments   Labs (all labs ordered are listed, but only abnormal results are displayed) Labs Reviewed  COMPREHENSIVE METABOLIC PANEL - Abnormal; Notable for the following components:      Result Value   Glucose, Bld 100 (*)    Total Protein 8.4 (*)    Total Bilirubin 1.8 (*)    All other components within normal limits  CBC - Abnormal; Notable for the following components:   MCV 79.8 (*)    All other components within normal limits  LIPASE, BLOOD  URINALYSIS, ROUTINE W REFLEX MICROSCOPIC    EKG None  Radiology No results found.  Procedures Procedures   Medications Ordered in ED Medications  promethazine (PHENERGAN) injection 25 mg (25 mg Intramuscular Given 10/29/20 1756)    ED Course  I have reviewed the triage vital signs and the nursing notes.  Pertinent labs & imaging results that were available during my care of the patient were reviewed by me and considered in my medical decision making (see chart for details).    MDM Rules/Calculators/A&P                          Patient labs are unremarkable chemistry normal.  Given Phenergan IM with improvement of symptoms.  Given a prescription of Phenergan suppositories.  Advise follow-up with her OB/GYN doctor within the week.  Advised immediate return if she is unable to keep down any fluids or has worsening symptoms.  Final Clinical Impression(s) / ED Diagnoses Final diagnoses:  Vomiting during pregnancy    Rx / DC Orders ED Discharge Orders          Ordered    promethazine (PHENERGAN) 25  MG suppository  Every 6 hours PRN         10/29/20 1840             Cheryll Cockayne, MD 10/29/20 1840

## 2020-10-29 NOTE — ED Triage Notes (Signed)
[redacted] weeks pregnant. Vomiting x 3 days.

## 2020-10-29 NOTE — Discharge Instructions (Addendum)
Call your OB/GYN doctor within the next 2 to 3 days for follow-up.  Return to the ER if you are still unable to keep down any fluids or feel more dehydrated or have any additional concerns such as abdominal pain or fevers or vaginal bleeding.

## 2020-10-30 ENCOUNTER — Inpatient Hospital Stay (HOSPITAL_COMMUNITY)
Admission: RE | Admit: 2020-10-30 | Discharge: 2020-10-30 | Disposition: A | Discharge status: Home / Self Care | Payer: Medicaid Other | Attending: Family Medicine | Admitting: Family Medicine

## 2020-10-30 ENCOUNTER — Encounter (HOSPITAL_COMMUNITY): Payer: Self-pay | Admitting: Family Medicine

## 2020-10-30 DIAGNOSIS — Z8759 Personal history of other complications of pregnancy, childbirth and the puerperium: Secondary | ICD-10-CM | POA: Diagnosis not present

## 2020-10-30 DIAGNOSIS — O210 Mild hyperemesis gravidarum: Secondary | ICD-10-CM | POA: Diagnosis not present

## 2020-10-30 DIAGNOSIS — Z79899 Other long term (current) drug therapy: Secondary | ICD-10-CM | POA: Diagnosis not present

## 2020-10-30 DIAGNOSIS — O219 Vomiting of pregnancy, unspecified: Secondary | ICD-10-CM

## 2020-10-30 DIAGNOSIS — Z8249 Family history of ischemic heart disease and other diseases of the circulatory system: Secondary | ICD-10-CM | POA: Insufficient documentation

## 2020-10-30 DIAGNOSIS — Z3A11 11 weeks gestation of pregnancy: Secondary | ICD-10-CM | POA: Diagnosis not present

## 2020-10-30 LAB — URINALYSIS, ROUTINE W REFLEX MICROSCOPIC
Bacteria, UA: NONE SEEN
Bilirubin Urine: NEGATIVE
Glucose, UA: 50 mg/dL — AB
Hgb urine dipstick: NEGATIVE
Ketones, ur: 80 mg/dL — AB
Nitrite: NEGATIVE
Protein, ur: 30 mg/dL — AB
Specific Gravity, Urine: 1.027 (ref 1.005–1.030)
pH: 6 (ref 5.0–8.0)

## 2020-10-30 LAB — RAPID URINE DRUG SCREEN, HOSP PERFORMED
Amphetamines: NOT DETECTED
Barbiturates: NOT DETECTED
Benzodiazepines: NOT DETECTED
Cocaine: NOT DETECTED
Opiates: NOT DETECTED
Tetrahydrocannabinol: NOT DETECTED

## 2020-10-30 LAB — CBC
HCT: 36.8 % (ref 36.0–46.0)
Hemoglobin: 12.5 g/dL (ref 12.0–15.0)
MCH: 26.7 pg (ref 26.0–34.0)
MCHC: 34 g/dL (ref 30.0–36.0)
MCV: 78.5 fL — ABNORMAL LOW (ref 80.0–100.0)
Platelets: 277 10*3/uL (ref 150–400)
RBC: 4.69 MIL/uL (ref 3.87–5.11)
RDW: 13.3 % (ref 11.5–15.5)
WBC: 10.1 10*3/uL (ref 4.0–10.5)
nRBC: 0 % (ref 0.0–0.2)

## 2020-10-30 LAB — COMPREHENSIVE METABOLIC PANEL
ALT: 41 U/L (ref 0–44)
AST: 36 U/L (ref 15–41)
Albumin: 4.2 g/dL (ref 3.5–5.0)
Alkaline Phosphatase: 62 U/L (ref 38–126)
Anion gap: 16 — ABNORMAL HIGH (ref 5–15)
BUN: 21 mg/dL — ABNORMAL HIGH (ref 6–20)
CO2: 18 mmol/L — ABNORMAL LOW (ref 22–32)
Calcium: 9.7 mg/dL (ref 8.9–10.3)
Chloride: 100 mmol/L (ref 98–111)
Creatinine, Ser: 0.79 mg/dL (ref 0.44–1.00)
GFR, Estimated: >60 mL/min (ref >60)
Glucose, Bld: 150 mg/dL — ABNORMAL HIGH (ref 70–99)
Potassium: 3.4 mmol/L — ABNORMAL LOW (ref 3.5–5.1)
Sodium: 134 mmol/L — ABNORMAL LOW (ref 135–145)
Total Bilirubin: 2.2 mg/dL — ABNORMAL HIGH (ref 0.3–1.2)
Total Protein: 8.3 g/dL — ABNORMAL HIGH (ref 6.5–8.1)

## 2020-10-30 MED ORDER — ONDANSETRON HCL 4 MG/2ML IJ SOLN
4.0000 mg | Freq: Once | INTRAMUSCULAR | Status: AC
  Administered 2020-10-30: 4 mg via INTRAVENOUS
  Filled 2020-10-30: qty 2

## 2020-10-30 MED ORDER — METOCLOPRAMIDE HCL 5 MG/ML IJ SOLN
10.0000 mg | Freq: Once | INTRAMUSCULAR | Status: AC
  Administered 2020-10-30: 10 mg via INTRAVENOUS
  Filled 2020-10-30: qty 2

## 2020-10-30 MED ORDER — PROCHLORPERAZINE 25 MG RE SUPP: 25.0000 mg | Freq: Two times a day (BID) | RECTAL | 0 refills | Status: DC | PRN

## 2020-10-30 MED ORDER — LACTATED RINGERS IV BOLUS
1000.0000 mL | Freq: Once | INTRAVENOUS | Status: AC
  Administered 2020-10-30: 1000 mL via INTRAVENOUS

## 2020-10-30 MED ORDER — FAMOTIDINE IN NACL 20-0.9 MG/50ML-% IV SOLN
20.0000 mg | Freq: Once | INTRAVENOUS | Status: AC
  Administered 2020-10-30: 20 mg via INTRAVENOUS
  Filled 2020-10-30: qty 50

## 2020-10-30 MED ORDER — FAMOTIDINE 10 MG PO TABS: 10.0000 mg | ORAL_TABLET | Freq: Two times a day (BID) | ORAL | 0 refills | Status: DC

## 2020-10-30 MED ORDER — SODIUM CHLORIDE 0.9 % IV SOLN
25.0000 mg | Freq: Once | INTRAVENOUS | Status: AC
  Administered 2020-10-30: 25 mg via INTRAVENOUS
  Filled 2020-10-30: qty 1

## 2020-10-30 NOTE — MAU Note (Signed)
Camelia Eng CNM in and pt's hyperventilating has improved. Pt asks repeatedly for water but explained she will only throw it up. WIll give IVFs with meds and then try po flds

## 2020-10-30 NOTE — MAU Note (Addendum)
Throwing up for 4 days. Went to "new hospital" today and given a shot and sent home. Did not help. Chest is sore from vomiting. PT unable to void currently. Actively having dry heaves. Was unable to pick up PHenergan supp ordered earlier yesterday

## 2020-10-30 NOTE — MAU Provider Note (Addendum)
Chief Complaint:  Emesis During Pregnancy   None    HPI: Deborah Evans is a 25 y.o. G3P1011 at [redacted]w[redacted]d who presents to maternity admissions reporting nausea and vomiting for 4 days. Patient reports that she was seen at [facility] yesterday and was given a shot of phenergan and discharged home with a prescription for phenergan suppositories. She has not yet picked up her prescription. She reports that she is unable to keep down any food or fluids and has vomited "so many times". She reports burning in her chest when she vomits. Denies abdominal pain, vaginal bleeding, fever, or diarrhea.   Pregnancy Course:   Past Medical History:  Diagnosis Date   Hyperemesis gravidarum    OB History  Gravida Para Term Preterm AB Living  3 1 1   1 1   SAB IAB Ectopic Multiple Live Births  1     0 1    # Outcome Date GA Lbr Len/2nd Weight Sex Delivery Anes PTL Lv  3 Current           2 Term 04/25/18 [redacted]w[redacted]d 13:50 / 00:28 2980 g F Vag-Spont EPI  LIV  1 SAB 06/2014 [redacted]w[redacted]d          Past Surgical History:  Procedure Laterality Date   DILATION AND CURETTAGE OF UTERUS N/A 07/15/2014   Procedure: SUCTION DILATATION AND CURETTAGE;  Surgeon: 07/17/2014, MD;  Location: [facility];  Service: Gynecology;  Laterality: N/A;   Family History  Problem Relation Age of Onset   Hypertension Maternal Grandmother    ADD / ADHD Neg Hx    Alcohol abuse Neg Hx    Anxiety disorder Neg Hx    Arthritis Neg Hx    Birth defects Neg Hx    Asthma Neg Hx    Cancer Neg Hx    COPD Neg Hx    Depression Neg Hx    Diabetes Neg Hx    Drug abuse Neg Hx    Early death Neg Hx    Hearing loss Neg Hx    Heart disease Neg Hx    Hyperlipidemia Neg Hx    Intellectual disability Neg Hx    Kidney disease Neg Hx    Learning disabilities Neg Hx    Miscarriages / Stillbirths Neg Hx    Obesity Neg Hx    Stroke Neg Hx    Vision loss Neg Hx    Varicose Veins Neg Hx    Social History   Tobacco Use   Smoking status: Never   Smokeless  tobacco: Never  Vaping Use   Vaping Use: Never used  Substance Use Topics   Alcohol use: No   Drug use: No   No Known Allergies Medications Prior to Admission  Medication Sig Dispense Refill Last Dose   Doxylamine-Pyridoxine 10-10 MG TBEC Take 2 tabs at bedtime. If needed, add another tab in the morning. If needed, add another tab in the afternoon, up to 4 tabs/day. 120 tablet 0 10/29/2020   cyclobenzaprine (FLEXERIL) 10 MG tablet Take 1 tablet (10 mg total) by mouth 3 (three) times daily as needed for muscle spasms. 20 tablet 0    oxyCODONE-acetaminophen (PERCOCET) 5-325 MG tablet Take 1 tablet by mouth every 6 (six) hours as needed. 12 tablet 0    Prenatal MV-Min-FA-Omega-3 (PRENATAL GUMMIES/DHA & FA PO) Take 2 tablets by mouth daily.       promethazine (PHENERGAN) 25 MG suppository Place 1 suppository (25 mg total) rectally every 6 (six) hours  as needed for nausea or vomiting. 12 each 0    I have reviewed patient's Past Medical Hx, Surgical Hx, Family Hx, Social Hx, medications and allergies.   ROS:  Review of Systems  Constitutional: Negative.   Respiratory: Negative.    Cardiovascular: Negative.   Gastrointestinal:  Positive for nausea and vomiting. Negative for abdominal pain and diarrhea.  Genitourinary: Negative.   Musculoskeletal: Negative.   Neurological: Negative.   Psychiatric/Behavioral: Negative.     Physical Exam  Patient Vitals for the past 24 hrs:  Temp Resp Height Weight  10/30/20 0230 97.9 F (36.6 C) 18 5\' 8"  (1.727 m) 66.2 kg   Constitutional: well-developed, well-nourished female in no acute distress.  Cardiovascular: normal rate Respiratory: normal effort GI: abd soft, non-tender  MS: extremities nontender, no edema, normal ROM Neurologic: alert and oriented x 4.  GU: neg CVAT. Pelvic: deferred    FHT: 169 bpm via doppler   Labs: Results for orders placed or performed during the hospital encounter of 10/30/20 (from the past 24 hour(s))  CBC      Status: Abnormal   Collection Time: 10/30/20  2:58 AM  Result Value Ref Range   WBC 10.1 4.0 - 10.5 K/uL   RBC 4.69 3.87 - 5.11 MIL/uL   Hemoglobin 12.5 12.0 - 15.0 g/dL   HCT 12/31/20 94.8 - 54.6 %   MCV 78.5 (L) 80.0 - 100.0 fL   MCH 26.7 26.0 - 34.0 pg   MCHC 34.0 30.0 - 36.0 g/dL   RDW 27.0 35.0 - 09.3 %   Platelets 277 150 - 400 K/uL   nRBC 0.0 0.0 - 0.2 %  Comprehensive metabolic panel     Status: Abnormal   Collection Time: 10/30/20  2:58 AM  Result Value Ref Range   Sodium 134 (L) 135 - 145 mmol/L   Potassium 3.4 (L) 3.5 - 5.1 mmol/L   Chloride 100 98 - 111 mmol/L   CO2 18 (L) 22 - 32 mmol/L   Glucose, Bld 150 (H) 70 - 99 mg/dL   BUN 21 (H) 6 - 20 mg/dL   Creatinine, Ser 12/31/20 0.44 - 1.00 mg/dL   Calcium 9.7 8.9 - 2.99 mg/dL   Total Protein 8.3 (H) 6.5 - 8.1 g/dL   Albumin 4.2 3.5 - 5.0 g/dL   AST 36 15 - 41 U/L   ALT 41 0 - 44 U/L   Alkaline Phosphatase 62 38 - 126 U/L   Total Bilirubin 2.2 (H) 0.3 - 1.2 mg/dL   GFR, Estimated 37.1 >69 mL/min   Anion gap 16 (H) 5 - 15  Urinalysis, Routine w reflex microscopic Urine, Clean Catch     Status: Abnormal   Collection Time: 10/30/20  6:35 AM  Result Value Ref Range   Color, Urine YELLOW YELLOW   APPearance HAZY (A) CLEAR   Specific Gravity, Urine 1.027 1.005 - 1.030   pH 6.0 5.0 - 8.0   Glucose, UA 50 (A) NEGATIVE mg/dL   Hgb urine dipstick NEGATIVE NEGATIVE   Bilirubin Urine NEGATIVE NEGATIVE   Ketones, ur 80 (A) NEGATIVE mg/dL   Protein, ur 30 (A) NEGATIVE mg/dL   Nitrite NEGATIVE NEGATIVE   Leukocytes,Ua TRACE (A) NEGATIVE   RBC / HPF 0-5 0 - 5 RBC/hpf   WBC, UA 21-50 0 - 5 WBC/hpf   Bacteria, UA NONE SEEN NONE SEEN   Squamous Epithelial / LPF 6-10 0 - 5   Mucus PRESENT   Rapid urine drug screen (hospital performed)  Status: None   Collection Time: 10/30/20  6:35 AM  Result Value Ref Range   Opiates NONE DETECTED NONE DETECTED   Cocaine NONE DETECTED NONE DETECTED   Benzodiazepines NONE DETECTED NONE DETECTED    Amphetamines NONE DETECTED NONE DETECTED   Tetrahydrocannabinol NONE DETECTED NONE DETECTED   Barbiturates NONE DETECTED NONE DETECTED    Imaging:  No results found.  MAU Course: Orders Placed This Encounter  Procedures   Urinalysis, Routine w reflex microscopic Urine, Clean Catch   CBC   Comprehensive metabolic panel   Insert peripheral IV   Meds ordered this encounter  Medications   promethazine (PHENERGAN) 25 mg in sodium chloride 0.9 % 1,000 mL infusion   famotidine (PEPCID) IVPB 20 mg premix   lactated ringers bolus 1,000 mL   ondansetron (ZOFRAN) injection 4 mg   lactated ringers bolus 1,000 mL   metoCLOPramide (REGLAN) injection 10 mg    MDM: CBC and CMP reassuring Phenergan infusion and pepcid IV given with minimal improvement in nausea and vomiting Second bag of LR with reglan given After second bag of fluid patient reports she is unable to get up to leave a urine sample because she is "too woozy" and nauseous. RN offered bedpan to patient and was able to leave urine sample for UA. Patient continues to dry heave 3rd bag of LR and dose of zofran IV ordered  Care handed over to Thalia Bloodgood, CNM at 0800    Camelia Eng, CNM 10/30/20 8:06 AM  --Actual weight 66.2 kg. Previously charted weights from office are stated not actual.  --Patient has existing prescription for Phenergan suppositories but has not picked up from pharmacy. Previous negative experience with Scopolamine, will not prescribe. New rx Pepcid and Compazine PRN  Meds ordered this encounter  Medications   promethazine (PHENERGAN) 25 mg in sodium chloride 0.9 % 1,000 mL infusion   famotidine (PEPCID) IVPB 20 mg premix   lactated ringers bolus 1,000 mL   ondansetron (ZOFRAN) injection 4 mg   lactated ringers bolus 1,000 mL   metoCLOPramide (REGLAN) injection 10 mg   famotidine (PEPCID) 10 MG tablet    Sig: Take 1 tablet (10 mg total) by mouth 2 (two) times daily.    Dispense:  60 tablet     Refill:  0    Order Specific Question:   Supervising Provider    Answer:   Jaynie Collins A [[id number]]   prochlorperazine (COMPAZINE) 25 MG suppository    Sig: Place 1 suppository (25 mg total) rectally every 12 (twelve) hours as needed for nausea or vomiting.    Dispense:  12 suppository    Refill:  0    Order Specific Question:   Supervising Provider    Answer:   Tereso Newcomer [[id number]]    Assessment/Plan: --25 y.o. G3P1011 at [redacted]w[redacted]d  --FHT 169 by Doppler --Tolerating PO prior to discharge --Discharge home in stable condition  Clayton Bibles, MSN, CNM Certified Nurse Midwife, [employer] for [employer], [facility] 10/30/20 11:34 AM

## 2020-10-30 NOTE — Discharge Instructions (Signed)
Vomiting in First Trimester Follow these instructions at home: To help relieve your symptoms, listen to your body. Everyone is different and has different preferences. Find what works best for you. Here are some things you can try to help relieve your symptoms: Meals and snacks Eat 5-6 small meals daily instead of 3 large meals. Eating small meals and snacks can help you avoid an empty stomach. Before getting out of bed, eat a couple of crackers to avoid moving around on an empty stomach. Eat a protein-rich snack before bed. Examples include cheese and crackers, or a peanut butter sandwich made with 1 slice of whole-wheat bread and 1 tsp (5 g) of peanut butter. Eat and drink slowly. Try eating starchy foods as these are usually tolerated well. Examples include cereal, toast, bread, potatoes, pasta, rice, and pretzels. Eat at least one serving of protein with your meals and snacks. Protein options include lean meats, poultry, seafood, beans, nuts, nut butters, eggs, cheese, and yogurt. Eat or suck on things that have ginger in them. It may help to relieve nausea. Add  tsp (0.44 g) ground ginger to hot tea, or choose ginger tea.   Fluids It is important to stay hydrated. Try to: Drink small amounts of fluids often. Drink fluids 30 minutes before or after a meal to help lessen the feeling of a full stomach. Drink 100% fruit juice or an electrolyte drink. An electrolyte drink contains sodium, potassium, and chloride. Drink fluids that are cold, clear, and carbonated or sour. These include lemonade, ginger ale, lemon-lime soda, ice water, and sparkling water. Things to avoid Avoid the following: Eating foods that trigger your symptoms. These may include spicy foods, coffee, high-fat foods, very sweet foods, and acidic foods. Drinking more than 1 cup of fluid at a time. Skipping meals. Nausea can be more intense on an empty stomach. If you cannot tolerate food, do not force it. Try sucking on ice  chips or other frozen items and make up for missed calories later. Lying down within 2 hours after eating. Being exposed to environmental triggers. These may include food smells, smoky rooms, closed spaces, rooms with strong smells, warm or humid places, overly loud and noisy rooms, and rooms with motion or flickering lights. Try eating meals in a well-ventilated area that is free of strong smells. Making quick and sudden changes in your movement. Taking iron pills and multivitamins that contain iron. If you take prescription iron pills, do not stop taking them unless your health care provider approves. Preparing food. The smell of food can spoil your appetite or trigger nausea. General instructions Brush your teeth or use a mouth rinse after meals. Take over-the-counter and prescription medicines only as told by your health care provider. Follow instructions from your health care provider about eating or drinking restrictions. Talk with your health care provider about starting a supplement of vitamin B6. Continue to take your prenatal vitamins as told by your health care provider. If you are having trouble taking your prenatal vitamins, talk with your health care provider about other options. Keep all follow-up visits. This is important. Follow-up visits include prenatal visits. Contact a health care provider if: You have pain in your abdomen. You have a severe headache. You have vision problems. You are losing weight. You feel weak or dizzy. You cannot eat or drink without vomiting, especially if this goes on for a full day. Get help right away if: You cannot drink fluids without vomiting. You vomit blood. You have constant   nausea and vomiting. You are very weak. You faint. You have a fever and your symptoms suddenly get worse. Summary Making some changes to your eating habits may help relieve nausea and vomiting. This condition may be managed with lifestyle changes and medicines as  prescribed by your health care provider. If medicines do not help relieve nausea and vomiting, you may need to receive fluids through an IV at the hospital. This information is not intended to replace advice given to you by your health care provider. Make sure you discuss any questions you have with your health care provider. Document Revised: 11/10/2019 Document Reviewed: 11/10/2019 Elsevier Patient Education  2021 [employer]vier Inc.  

## 2020-10-30 NOTE — MAU Note (Signed)
Pt is hyperventilating some and encouraged to slow breathing. States hands tingling. Encouraged to breath into clean emesis bag.

## 2020-10-30 NOTE — MAU Note (Addendum)
Went back into pt's room and she states she cannot get up. "I'm too woozy". Will offer pt bedpan. When returned with bedpan pt had gotten up to bathroom and obtained u/a and was sitting at bedside with dry heaves. Lysle Dingwall CNM aware and 3rd bag of IVFs and nausea med ordered. Pt aware

## 2020-10-30 NOTE — MAU Note (Signed)
D Simpson CNM aware pt states she does not feel any better and cannot urinate. STates she has had one episode of vomiting small amt since Zofran. Talked more to pt and encouraged her to try to void if she could. Pt asked "give me a minute" and she would try. CNM aware

## 2020-10-31 ENCOUNTER — Inpatient Hospital Stay (HOSPITAL_COMMUNITY)
Admission: AD | Admit: 2020-10-31 | Discharge: 2020-10-31 | Disposition: A | Discharge status: Home / Self Care | Payer: Medicaid Other | Attending: Obstetrics and Gynecology | Admitting: Obstetrics and Gynecology

## 2020-10-31 ENCOUNTER — Encounter (HOSPITAL_COMMUNITY): Payer: Self-pay | Admitting: Obstetrics and Gynecology

## 2020-10-31 ENCOUNTER — Other Ambulatory Visit: Payer: Self-pay

## 2020-10-31 DIAGNOSIS — O219 Vomiting of pregnancy, unspecified: Secondary | ICD-10-CM | POA: Insufficient documentation

## 2020-10-31 DIAGNOSIS — Z3A11 11 weeks gestation of pregnancy: Secondary | ICD-10-CM | POA: Insufficient documentation

## 2020-10-31 DIAGNOSIS — O210 Mild hyperemesis gravidarum: Secondary | ICD-10-CM | POA: Diagnosis not present

## 2020-10-31 HISTORY — DX: Depression, unspecified: F32.A

## 2020-10-31 LAB — COMPREHENSIVE METABOLIC PANEL
ALT: 46 U/L — ABNORMAL HIGH (ref 0–44)
AST: 42 U/L — ABNORMAL HIGH (ref 15–41)
Albumin: 3.9 g/dL (ref 3.5–5.0)
Alkaline Phosphatase: 51 U/L (ref 38–126)
Anion gap: 11 (ref 5–15)
BUN: 17 mg/dL (ref 6–20)
CO2: 17 mmol/L — ABNORMAL LOW (ref 22–32)
Calcium: 9.4 mg/dL (ref 8.9–10.3)
Chloride: 104 mmol/L (ref 98–111)
Creatinine, Ser: 0.61 mg/dL (ref 0.44–1.00)
GFR, Estimated: >60 mL/min (ref >60)
Glucose, Bld: 98 mg/dL (ref 70–99)
Potassium: 3 mmol/L — ABNORMAL LOW (ref 3.5–5.1)
Sodium: 132 mmol/L — ABNORMAL LOW (ref 135–145)
Total Bilirubin: 2.8 mg/dL — ABNORMAL HIGH (ref 0.3–1.2)
Total Protein: 7.2 g/dL (ref 6.5–8.1)

## 2020-10-31 LAB — CBC WITH DIFFERENTIAL/PLATELET
Abs Immature Granulocytes: 0.04 10*3/uL (ref 0.00–0.07)
Basophils Absolute: 0 10*3/uL (ref 0.0–0.1)
Basophils Relative: 0 %
Eosinophils Absolute: 0 10*3/uL (ref 0.0–0.5)
Eosinophils Relative: 0 %
HCT: 33.4 % — ABNORMAL LOW (ref 36.0–46.0)
Hemoglobin: 11.2 g/dL — ABNORMAL LOW (ref 12.0–15.0)
Immature Granulocytes: 0 %
Lymphocytes Relative: 14 %
Lymphs Abs: 1.4 10*3/uL (ref 0.7–4.0)
MCH: 26.9 pg (ref 26.0–34.0)
MCHC: 33.5 g/dL (ref 30.0–36.0)
MCV: 80.1 fL (ref 80.0–100.0)
Monocytes Absolute: 0.7 10*3/uL (ref 0.1–1.0)
Monocytes Relative: 7 %
Neutro Abs: 8.4 10*3/uL — ABNORMAL HIGH (ref 1.7–7.7)
Neutrophils Relative %: 79 %
Platelets: 216 10*3/uL (ref 150–400)
RBC: 4.17 MIL/uL (ref 3.87–5.11)
RDW: 13.4 % (ref 11.5–15.5)
WBC: 10.6 10*3/uL — ABNORMAL HIGH (ref 4.0–10.5)
nRBC: 0 % (ref 0.0–0.2)

## 2020-10-31 LAB — URINALYSIS, ROUTINE W REFLEX MICROSCOPIC
Bilirubin Urine: NEGATIVE
Glucose, UA: 50 mg/dL — AB
Hgb urine dipstick: NEGATIVE
Ketones, ur: 80 mg/dL — AB
Nitrite: NEGATIVE
Protein, ur: 100 mg/dL — AB
Specific Gravity, Urine: 1.028 (ref 1.005–1.030)
pH: 6 (ref 5.0–8.0)

## 2020-10-31 MED ORDER — METHYLPREDNISOLONE SODIUM SUCC 125 MG IJ SOLR
48.0000 mg | Freq: Once | INTRAMUSCULAR | Status: AC
  Administered 2020-10-31: 48 mg via INTRAVENOUS
  Filled 2020-10-31: qty 2

## 2020-10-31 MED ORDER — SCOPOLAMINE 1 MG/3DAYS TD PT72
1.0000 | MEDICATED_PATCH | TRANSDERMAL | Status: DC
  Administered 2020-10-31: 1.5 mg via TRANSDERMAL
  Filled 2020-10-31: qty 1

## 2020-10-31 MED ORDER — FAMOTIDINE IN NACL 20-0.9 MG/50ML-% IV SOLN
20.0000 mg | Freq: Once | INTRAVENOUS | Status: AC
  Administered 2020-10-31: 20 mg via INTRAVENOUS
  Filled 2020-10-31: qty 50

## 2020-10-31 MED ORDER — METHYLPREDNISOLONE 8 MG PO TABS: ORAL_TABLET | ORAL | 0 refills | Status: DC

## 2020-10-31 MED ORDER — LACTATED RINGERS IV BOLUS
1000.0000 mL | Freq: Once | INTRAVENOUS | Status: AC
  Administered 2020-10-31: 1000 mL via INTRAVENOUS

## 2020-10-31 MED ORDER — GLYCOPYRROLATE 0.2 MG/ML IJ SOLN
0.1000 mg | Freq: Once | INTRAMUSCULAR | Status: AC
  Administered 2020-10-31: 0.1 mg via INTRAVENOUS
  Filled 2020-10-31: qty 1

## 2020-10-31 MED ORDER — SCOPOLAMINE 1 MG/3DAYS TD PT72: 1.0000 | MEDICATED_PATCH | TRANSDERMAL | 12 refills | Status: DC

## 2020-10-31 MED ORDER — SODIUM CHLORIDE 0.9 % IV SOLN
25.0000 mg | Freq: Once | INTRAVENOUS | Status: AC
  Administered 2020-10-31: 25 mg via INTRAVENOUS
  Filled 2020-10-31: qty 1

## 2020-10-31 MED ORDER — PROCHLORPERAZINE MALEATE 10 MG PO TABS: 10.0000 mg | ORAL_TABLET | Freq: Two times a day (BID) | ORAL | 1 refills | Status: DC

## 2020-10-31 MED ORDER — SODIUM CHLORIDE 0.9 % IV SOLN
8.0000 mg | Freq: Once | INTRAVENOUS | Status: AC
  Administered 2020-10-31: 8 mg via INTRAVENOUS
  Filled 2020-10-31: qty 4

## 2020-10-31 MED ORDER — ONDANSETRON 8 MG PO TBDP: 8.0000 mg | ORAL_TABLET | Freq: Three times a day (TID) | ORAL | 0 refills | Status: DC | PRN

## 2020-10-31 NOTE — MAU Note (Signed)
Resistance noted with flushing IV-infiltrated-swelling noted to L of site-angiocath removed-IV restarted in L antecub. Flushed with good blood return LR at bolus rate - Zofran finishing

## 2020-10-31 NOTE — MAU Note (Signed)
Pt reports she remains nauseated- 2nd liter completed

## 2020-10-31 NOTE — MAU Note (Signed)
Pt unable to void at this time.  Asked for a few minutes to change clothes.  Informed pt she can wait to seen if needs to change into gown

## 2020-10-31 NOTE — MAU Note (Signed)
"  Severe throwing up still, this time it's black".  Just so weak. Constant pain in lower abd and low back.

## 2020-10-31 NOTE — MAU Provider Note (Addendum)
History     CSN: [id number]  Arrival date and time: 10/31/20 1443   Event Date/Time   First Provider Initiated Contact with Patient 10/31/20 1553      Chief Complaint  Patient presents with   Emesis   Deborah Evans is a 25 y.o. G3P1011 at [redacted]w[redacted]d who presents today with nausea and vomiting. She states that she has been given meds for this , but she doesn't know what any of the names of the medications are. She tried the suppositories, but they hurt. She tried something this morning, but vomited after taking it. She thinks it was the medication that dissolves.   Emesis  This is a new problem. The current episode started in the past 7 days. The problem occurs more than 10 times per day. The problem has been unchanged. The emesis has an appearance of stomach contents. There has been no fever. Risk factors: pregnancy.   OB History     Gravida  3   Para  1   Term  1   Preterm      AB  1   Living  1      SAB  1   IAB      Ectopic      Multiple  0   Live Births  1           Past Medical History:  Diagnosis Date   Depression    when going through hyperemesis with first preg, ok now   Hyperemesis gravidarum     Past Surgical History:  Procedure Laterality Date   DILATION AND CURETTAGE OF UTERUS N/A 07/15/2014   Procedure: SUCTION DILATATION AND CURETTAGE;  Surgeon: Lazaro Arms, MD;  Location: [facility];  Service: Gynecology;  Laterality: N/A;    Family History  Problem Relation Age of Onset   Healthy Mother    Aneurysm Father    Hypertension Maternal Grandmother    ADD / ADHD Neg Hx    Alcohol abuse Neg Hx    Anxiety disorder Neg Hx    Arthritis Neg Hx    Birth defects Neg Hx    Asthma Neg Hx    Cancer Neg Hx    COPD Neg Hx    Depression Neg Hx    Diabetes Neg Hx    Drug abuse Neg Hx    Early death Neg Hx    Hearing loss Neg Hx    Heart disease Neg Hx    Hyperlipidemia Neg Hx    Intellectual disability Neg Hx    Kidney disease Neg Hx     Learning disabilities Neg Hx    Miscarriages / Stillbirths Neg Hx    Obesity Neg Hx    Stroke Neg Hx    Vision loss Neg Hx    Varicose Veins Neg Hx     Social History   Tobacco Use   Smoking status: Never   Smokeless tobacco: Never  Vaping Use   Vaping Use: Never used  Substance Use Topics   Alcohol use: No   Drug use: No    Allergies: No Known Allergies  Medications Prior to Admission  Medication Sig Dispense Refill Last Dose   Doxylamine-Pyridoxine 10-10 MG TBEC Take 2 tabs at bedtime. If needed, add another tab in the morning. If needed, add another tab in the afternoon, up to 4 tabs/day. 120 tablet 0 10/31/2020   famotidine (PEPCID) 10 MG tablet Take 1 tablet (10 mg total) by mouth 2 (two) times  daily. 60 tablet 0 10/31/2020   Prenatal MV-Min-FA-Omega-3 (PRENATAL GUMMIES/DHA & FA PO) Take 2 tablets by mouth daily.    Past Week   prochlorperazine (COMPAZINE) 25 MG suppository Place 1 suppository (25 mg total) rectally every 12 (twelve) hours as needed for nausea or vomiting. 12 suppository 0    promethazine (PHENERGAN) 25 MG suppository Place 1 suppository (25 mg total) rectally every 6 (six) hours as needed for nausea or vomiting. 12 each 0     Review of Systems  Gastrointestinal:  Positive for vomiting.  Physical Exam   Blood pressure 128/85, pulse 87, temperature 98.3 F (36.8 C), temperature source Oral, resp. rate 20, weight 67.6 kg, last menstrual period 08/10/2020, SpO2 100 %, unknown if currently breastfeeding.  Physical Exam Vitals and nursing note reviewed.  Constitutional:      General: She is not in acute distress. HENT:     Head: Normocephalic.  Eyes:     Pupils: Pupils are equal, round, and reactive to light.  Cardiovascular:     Rate and Rhythm: Normal rate.  Pulmonary:     Effort: Pulmonary effort is normal.  Abdominal:     Palpations: Abdomen is soft.     Tenderness: There is no abdominal tenderness.  Skin:    General: Skin is warm and dry.   Neurological:     Mental Status: She is alert and oriented to person, place, and time.  Psychiatric:        Mood and Affect: Mood normal.        Behavior: Behavior normal.   Results for orders placed or performed during the hospital encounter of 10/31/20 (from the past 24 hour(s))  CBC with Differential/Platelet     Status: Abnormal   Collection Time: 10/31/20  4:04 PM  Result Value Ref Range   WBC 10.6 (H) 4.0 - 10.5 K/uL   RBC 4.17 3.87 - 5.11 MIL/uL   Hemoglobin 11.2 (L) 12.0 - 15.0 g/dL   HCT 23.5 (L) 57.3 - 22.0 %   MCV 80.1 80.0 - 100.0 fL   MCH 26.9 26.0 - 34.0 pg   MCHC 33.5 30.0 - 36.0 g/dL   RDW 25.4 27.0 - 62.3 %   Platelets 216 150 - 400 K/uL   nRBC 0.0 0.0 - 0.2 %   Neutrophils Relative % 79 %   Neutro Abs 8.4 (H) 1.7 - 7.7 K/uL   Lymphocytes Relative 14 %   Lymphs Abs 1.4 0.7 - 4.0 K/uL   Monocytes Relative 7 %   Monocytes Absolute 0.7 0.1 - 1.0 K/uL   Eosinophils Relative 0 %   Eosinophils Absolute 0.0 0.0 - 0.5 K/uL   Basophils Relative 0 %   Basophils Absolute 0.0 0.0 - 0.1 K/uL   Immature Granulocytes 0 %   Abs Immature Granulocytes 0.04 0.00 - 0.07 K/uL  Comprehensive metabolic panel     Status: Abnormal   Collection Time: 10/31/20  4:04 PM  Result Value Ref Range   Sodium 132 (L) 135 - 145 mmol/L   Potassium 3.0 (L) 3.5 - 5.1 mmol/L   Chloride 104 98 - 111 mmol/L   CO2 17 (L) 22 - 32 mmol/L   Glucose, Bld 98 70 - 99 mg/dL   BUN 17 6 - 20 mg/dL   Creatinine, Ser 7.62 0.44 - 1.00 mg/dL   Calcium 9.4 8.9 - 83.1 mg/dL   Total Protein 7.2 6.5 - 8.1 g/dL   Albumin 3.9 3.5 - 5.0 g/dL   AST 42 (H)  15 - 41 U/L   ALT 46 (H) 0 - 44 U/L   Alkaline Phosphatase 51 38 - 126 U/L   Total Bilirubin 2.8 (H) 0.3 - 1.2 mg/dL   GFR, Estimated >29 >79 mL/min   Anion gap 11 5 - 15    MAU Course  Procedures  MDM Patient has had 2L of LR bolus, scopolamine patch applied, IV zofran, IV pepcid and 48mg  IV solumedrol to start steroid taper.   8:02 PM  care turned  over to , CNM  RN reports that IV has infiltrated, and she restarted the IV in her left arm now.   Cleone Slim DNP, CNM  10/31/20  8:04 PM   Patient still spitting and reporting nausea. Will give phenergan and robinul. Discussed with patient expectations for vomiting and nausea are to not completely resolve the symptoms but to make them more manageable.   Weight gain documented since last visit and commended patient on attempts at home.    Assessment and Plan   1. Nausea/vomiting in pregnancy   2. [redacted] weeks gestation of pregnancy    DC home Comfort measures reviewed  2nd Trimester precautions  Fetal kick counts RX: Medrol taper as directed, Scopolamine as directed, zofran PRN, Compazine BID scheduled  Return to MAU as needed Start prenatal care as soon as possible    Follow-up Information     [facility]. Schedule an appointment as soon as possible for a visit.   Specialty: Obstetrics and Gynecology Contact information: [address] [place] [phone]                [phone], Rolm Bookbinder 10/31/20 10:48 PM

## 2020-10-31 NOTE — MAU Note (Signed)
Pt up to bathroom-urinated small amount of dark yellow urine-sample collected and sent to lab. 2nd liter of LR hung infusing at bolus rate on pump

## 2020-11-02 ENCOUNTER — Telehealth: Payer: Self-pay

## 2020-11-02 NOTE — Telephone Encounter (Signed)
Transition Care Management Unsuccessful Follow-up Telephone Call  Date of discharge and from where:  10/29/2020 from [facility]  Attempts:  1st Attempt  Reason for unsuccessful TCM follow-up call:  Unable to leave message

## 2020-11-03 ENCOUNTER — Inpatient Hospital Stay (HOSPITAL_COMMUNITY)
Admission: AD | Admit: 2020-11-03 | Discharge: 2020-11-03 | Disposition: A | Discharge status: Home / Self Care | Payer: Medicaid Other | Attending: Obstetrics and Gynecology | Admitting: Obstetrics and Gynecology

## 2020-11-03 ENCOUNTER — Other Ambulatory Visit: Payer: Self-pay

## 2020-11-03 ENCOUNTER — Encounter (HOSPITAL_COMMUNITY): Payer: Self-pay | Admitting: Obstetrics and Gynecology

## 2020-11-03 DIAGNOSIS — R531 Weakness: Secondary | ICD-10-CM | POA: Insufficient documentation

## 2020-11-03 DIAGNOSIS — O26891 Other specified pregnancy related conditions, first trimester: Secondary | ICD-10-CM | POA: Diagnosis not present

## 2020-11-03 DIAGNOSIS — O99891 Other specified diseases and conditions complicating pregnancy: Secondary | ICD-10-CM

## 2020-11-03 DIAGNOSIS — Z3A12 12 weeks gestation of pregnancy: Secondary | ICD-10-CM

## 2020-11-03 DIAGNOSIS — O219 Vomiting of pregnancy, unspecified: Secondary | ICD-10-CM | POA: Diagnosis present

## 2020-11-03 DIAGNOSIS — R110 Nausea: Secondary | ICD-10-CM

## 2020-11-03 DIAGNOSIS — Z7952 Long term (current) use of systemic steroids: Secondary | ICD-10-CM | POA: Diagnosis not present

## 2020-11-03 DIAGNOSIS — R42 Dizziness and giddiness: Secondary | ICD-10-CM | POA: Diagnosis not present

## 2020-11-03 DIAGNOSIS — M549 Dorsalgia, unspecified: Secondary | ICD-10-CM

## 2020-11-03 LAB — URINALYSIS, ROUTINE W REFLEX MICROSCOPIC
Bilirubin Urine: NEGATIVE
Glucose, UA: NEGATIVE mg/dL
Hgb urine dipstick: NEGATIVE
Ketones, ur: 80 mg/dL — AB
Nitrite: NEGATIVE
Protein, ur: NEGATIVE mg/dL
Specific Gravity, Urine: 1.014 (ref 1.005–1.030)
pH: 7 (ref 5.0–8.0)

## 2020-11-03 LAB — GLUCOSE, CAPILLARY: Glucose-Capillary: 72 mg/dL (ref 70–99)

## 2020-11-03 MED ORDER — LACTATED RINGERS IV BOLUS
1000.0000 mL | Freq: Once | INTRAVENOUS | Status: AC
  Administered 2020-11-03: 1000 mL via INTRAVENOUS

## 2020-11-03 MED ORDER — METOCLOPRAMIDE HCL 5 MG/ML IJ SOLN
10.0000 mg | Freq: Once | INTRAMUSCULAR | Status: AC
  Administered 2020-11-03: 10 mg via INTRAVENOUS
  Filled 2020-11-03: qty 2

## 2020-11-03 MED ORDER — DOXYLAMINE-PYRIDOXINE 10-10 MG PO TBEC: DELAYED_RELEASE_TABLET | ORAL | 0 refills | Status: DC

## 2020-11-03 MED ORDER — CYCLOBENZAPRINE HCL 5 MG PO TABS
10.0000 mg | ORAL_TABLET | Freq: Once | ORAL | Status: AC
  Administered 2020-11-03: 10 mg via ORAL
  Filled 2020-11-03: qty 2

## 2020-11-03 MED ORDER — ONDANSETRON 4 MG PO TBDP
8.0000 mg | ORAL_TABLET | Freq: Once | ORAL | Status: AC
  Administered 2020-11-03: 8 mg via ORAL
  Filled 2020-11-03: qty 2

## 2020-11-03 NOTE — Telephone Encounter (Signed)
Transition Care Management Unsuccessful Follow-up Telephone Call  Date of discharge and from where:  10/29/2020 - [facility]  Attempts:  2nd Attempt  Reason for unsuccessful TCM follow-up call:  Unable to leave message

## 2020-11-03 NOTE — MAU Note (Signed)
Fell last night and again today around noon.  "Legs are real wheazy".  Still can't eat or drink.  No pain

## 2020-11-03 NOTE — MAU Provider Note (Signed)
History     CSN: [id number]  Arrival date and time: 11/03/20 1355   Event Date/Time   First Provider Initiated Contact with Patient 11/03/20 1625      Chief Complaint  Patient presents with   Fall   Nausea   Deborah Evans is a 25 y.o. G3P1011 at [redacted]w[redacted]d who receives care at [facility].  She presents today for Fall and Nausea.  She reports she has fallen twice; "last night and today around 12."  Patient states that each incident occurs when trying to get out of the bed.  She reports that her "legs just feel wheezy."  She denies dizziness, HA, and visual disturbances.  She denies trauma to the abdomen and reports some pain in her lower back.  She rates it a 7/10.  She states she has no problems with urination, but reports it is orange.    She reports she last ate at  [facility] [address]  [employer] [address]    OB History     Gravida  3   Para  1   Term  1   Preterm      AB  1   Living  1      SAB  1   IAB      Ectopic      Multiple  0   Live Births  1           Past Medical History:  Diagnosis Date   Depression    when going through hyperemesis with first preg, ok now   Hyperemesis gravidarum     Past Surgical History:  Procedure Laterality Date   DILATION AND CURETTAGE OF UTERUS N/A 07/15/2014   Procedure: SUCTION DILATATION AND CURETTAGE;  Surgeon: Lazaro Arms, MD;  Location: [facility];  Service: Gynecology;  Laterality: N/A;    Family History  Problem Relation Age of Onset   Healthy Mother    Aneurysm Father    Hypertension Maternal Grandmother    ADD / ADHD Neg Hx    Alcohol abuse Neg Hx    Anxiety disorder Neg Hx    Arthritis Neg Hx    Birth defects Neg Hx    Asthma Neg Hx    Cancer Neg Hx    COPD Neg Hx    Depression Neg Hx    Diabetes Neg Hx    Drug abuse Neg Hx    Early death Neg Hx    Hearing loss Neg Hx    Heart disease Neg Hx    Hyperlipidemia Neg Hx    Intellectual disability Neg Hx    Kidney disease Neg Hx    Learning  disabilities Neg Hx    Miscarriages / Stillbirths Neg Hx    Obesity Neg Hx    Stroke Neg Hx    Vision loss Neg Hx    Varicose Veins Neg Hx     Social History   Tobacco Use   Smoking status: Never   Smokeless tobacco: Never  Vaping Use   Vaping Use: Never used  Substance Use Topics   Alcohol use: No   Drug use: No    Allergies: No Known Allergies  Medications Prior to Admission  Medication Sig Dispense Refill Last Dose   Doxylamine-Pyridoxine 10-10 MG TBEC Take 2 tabs at bedtime. If needed, add another tab in the morning. If needed, add another tab in the afternoon, up to 4 tabs/day. 120 tablet 0    famotidine (PEPCID) 10 MG tablet Take 1  tablet (10 mg total) by mouth 2 (two) times daily. 60 tablet 0    methylPREDNISolone (MEDROL) 8 MG tablet Day 1-5: 2 tabs TID, Day 6-7: 1 tab TID, Day 8-10 half tab TID, Day 11-12 1 tab q AM, Day 13-14: half tab q AM 41 tablet 0    ondansetron (ZOFRAN ODT) 8 MG disintegrating tablet Take 1 tablet (8 mg total) by mouth every 8 (eight) hours as needed for nausea or vomiting. 20 tablet 0    Prenatal MV-Min-FA-Omega-3 (PRENATAL GUMMIES/DHA & FA PO) Take 2 tablets by mouth daily.       prochlorperazine (COMPAZINE) 10 MG tablet Take 1 tablet (10 mg total) by mouth in the morning and at bedtime. 60 tablet 1    prochlorperazine (COMPAZINE) 25 MG suppository Place 1 suppository (25 mg total) rectally every 12 (twelve) hours as needed for nausea or vomiting. 12 suppository 0    promethazine (PHENERGAN) 25 MG suppository Place 1 suppository (25 mg total) rectally every 6 (six) hours as needed for nausea or vomiting. 12 each 0    scopolamine (TRANSDERM-SCOP) 1 MG/3DAYS Place 1 patch (1.5 mg total) onto the skin every 3 (three) days. 10 patch 12     Review of Systems  Constitutional:  Negative for chills and fever.  Respiratory:  Negative for cough and shortness of breath.   Gastrointestinal:  Positive for diarrhea (x2 watery today. 10am) and nausea.  Negative for abdominal pain, constipation and vomiting.  Genitourinary:  Negative for difficulty urinating, dysuria, vaginal bleeding and vaginal discharge.  Musculoskeletal:  Positive for back pain (Lower).  Neurological:  Negative for dizziness and headaches.  Physical Exam   Blood pressure 126/71, pulse (!) 102, temperature 98.4 F (36.9 C), temperature source Oral, resp. rate 18, height 5\' 8"  (1.727 m), weight 64.7 kg, last menstrual period 08/10/2020, SpO2 99 %, unknown if currently breastfeeding.  Physical Exam Constitutional:      Appearance: Normal appearance.  Eyes:     Conjunctiva/sclera: Conjunctivae normal.  Cardiovascular:     Rate and Rhythm: Tachycardia present.     Heart sounds: Normal heart sounds.  Abdominal:     Palpations: Abdomen is soft.     Tenderness: There is abdominal tenderness in the right lower quadrant. There is no guarding.  Musculoskeletal:        General: Normal range of motion.     Cervical back: Normal range of motion.  Skin:    General: Skin is warm and dry.  Neurological:     Mental Status: She is alert and oriented to person, place, and time.  Psychiatric:        Mood and Affect: Mood normal.        Behavior: Behavior normal.        Thought Content: Thought content normal.    MAU Course  Procedures Results for orders placed or performed during the hospital encounter of 11/03/20 (from the past 24 hour(s))  Glucose, capillary     Status: None   Collection Time: 11/03/20  5:53 PM  Result Value Ref Range   Glucose-Capillary 72 70 - 99 mg/dL    MDM Start IV LR Bolus x2 Antiemetic CBG Muscle Relaxant Assessment and Plan  25 year old G3P1011 SIUP at 12.1 weeks Dizziness/Weakness Nausea Back Pain  -POC Reviewed. -Patient unable to leave urine specimen. -Informed that dizziness/weakness could be related to BS as intake has been minimal. -Will obtain CBG. -Will give Zofran for nausea.   -Discussed flexeril for back pain after  nausea subsides. -Patient with some tachycardia. Suspect dehydration. -Will start IV and give LR.  -Will monitor and reassess.   [name] 11/03/2020, 4:25 PM   Reassessment (6:26 PM)  -Patient reports no improvement in nausea. -CBG returns at 72. -Patient agreeable to sprite. -Will give additional liter of fluid and monitor.   Reassessment (7:46 PM)  -Patient reports that she has had one incident of vomiting. -Will give dose of reglan now. -Discussed how some nausea is expected in pregnancy, but would like to avoid need for NG tube as required in previous pregnancy. -Reviewed usage of Diclegis. -Rx to be sent to pharmacy on file. -Will give reglan and plan for discharge. -Patient without further questions or concerns. -Review of medications show patient with script for Diclegis.  Renewal request sent.  -Nurse instructed to inform patient of medications and administration. -Will send UA for processing. -Encouraged to call or return to MAU if symptoms worsen or with the onset of new symptoms. -Discharged to home in stable condition.  [name] MSN, CNM Advanced Practice Provider, [facility]

## 2020-11-03 NOTE — MAU Note (Signed)
Unable to pee at this time 

## 2020-11-04 NOTE — Telephone Encounter (Signed)
Transition Care Management Unsuccessful Follow-up Telephone Call  Date of discharge and from where:  10/29/2020- [facility]  Attempts:  3rd Attempt  Reason for unsuccessful TCM follow-up call:  Unable to leave message

## 2020-11-07 ENCOUNTER — Other Ambulatory Visit: Payer: Self-pay

## 2020-11-07 ENCOUNTER — Inpatient Hospital Stay (HOSPITAL_COMMUNITY)
Admission: AD | Admit: 2020-11-07 | Discharge: 2020-11-07 | Disposition: A | Discharge status: Home / Self Care | Payer: Medicaid Other | Attending: Family Medicine | Admitting: Family Medicine

## 2020-11-07 ENCOUNTER — Encounter (HOSPITAL_COMMUNITY): Payer: Self-pay | Admitting: Family Medicine

## 2020-11-07 DIAGNOSIS — O210 Mild hyperemesis gravidarum: Secondary | ICD-10-CM | POA: Diagnosis not present

## 2020-11-07 DIAGNOSIS — Z3A12 12 weeks gestation of pregnancy: Secondary | ICD-10-CM | POA: Diagnosis not present

## 2020-11-07 DIAGNOSIS — O219 Vomiting of pregnancy, unspecified: Secondary | ICD-10-CM | POA: Insufficient documentation

## 2020-11-07 LAB — CBC
HCT: 39.3 % (ref 36.0–46.0)
Hemoglobin: 13.3 g/dL (ref 12.0–15.0)
MCH: 26.2 pg (ref 26.0–34.0)
MCHC: 33.8 g/dL (ref 30.0–36.0)
MCV: 77.5 fL — ABNORMAL LOW (ref 80.0–100.0)
Platelets: 244 10*3/uL (ref 150–400)
RBC: 5.07 MIL/uL (ref 3.87–5.11)
RDW: 13.6 % (ref 11.5–15.5)
WBC: 6.7 10*3/uL (ref 4.0–10.5)
nRBC: 0 % (ref 0.0–0.2)

## 2020-11-07 LAB — COMPREHENSIVE METABOLIC PANEL
ALT: 80 U/L — ABNORMAL HIGH (ref 0–44)
AST: 44 U/L — ABNORMAL HIGH (ref 15–41)
Albumin: 3.6 g/dL (ref 3.5–5.0)
Alkaline Phosphatase: 61 U/L (ref 38–126)
Anion gap: 13 (ref 5–15)
BUN: 13 mg/dL (ref 6–20)
CO2: 19 mmol/L — ABNORMAL LOW (ref 22–32)
Calcium: 9 mg/dL (ref 8.9–10.3)
Chloride: 100 mmol/L (ref 98–111)
Creatinine, Ser: 0.69 mg/dL (ref 0.44–1.00)
GFR, Estimated: >60 mL/min (ref >60)
Glucose, Bld: 90 mg/dL (ref 70–99)
Potassium: 3.4 mmol/L — ABNORMAL LOW (ref 3.5–5.1)
Sodium: 132 mmol/L — ABNORMAL LOW (ref 135–145)
Total Bilirubin: 2.2 mg/dL — ABNORMAL HIGH (ref 0.3–1.2)
Total Protein: 7.1 g/dL (ref 6.5–8.1)

## 2020-11-07 LAB — URINALYSIS, ROUTINE W REFLEX MICROSCOPIC
Bilirubin Urine: NEGATIVE
Glucose, UA: NEGATIVE mg/dL
Hgb urine dipstick: NEGATIVE
Ketones, ur: 80 mg/dL — AB
Leukocytes,Ua: NEGATIVE
Nitrite: NEGATIVE
Protein, ur: 30 mg/dL — AB
Specific Gravity, Urine: 1.023 (ref 1.005–1.030)
pH: 6 (ref 5.0–8.0)

## 2020-11-07 MED ORDER — LACTATED RINGERS IV BOLUS
1000.0000 mL | Freq: Once | INTRAVENOUS | Status: AC
  Administered 2020-11-07: 1000 mL via INTRAVENOUS

## 2020-11-07 MED ORDER — SODIUM CHLORIDE 0.9 % IV SOLN
25.0000 mg | Freq: Once | INTRAVENOUS | Status: AC
  Administered 2020-11-07: 25 mg via INTRAVENOUS
  Filled 2020-11-07: qty 1

## 2020-11-07 MED ORDER — SCOPOLAMINE 1 MG/3DAYS TD PT72
1.0000 | MEDICATED_PATCH | TRANSDERMAL | Status: DC
  Administered 2020-11-07: 1.5 mg via TRANSDERMAL
  Filled 2020-11-07: qty 1

## 2020-11-07 NOTE — MAU Note (Signed)
Patient presents to MAU stating her "legs just feel weak."  States she hasn't been able to keep anything down. She has been taking her nausea meds, but they aren't helping.  She can't remember what they are.  Denies any vaginal bleeding, discharge/leaking, or pain.

## 2020-11-07 NOTE — MAU Provider Note (Signed)
Chief Complaint:  Nausea and Extremity Weakness    HPI: Deborah Evans is a 25 y.o. G3P1011 at 103w5d who presents to maternity admissions reporting nausea/vomiting and weakness in legs. Patient reports her "legs feel wheezy" when she stands up. This started several days ago and has continued. She was evaluated in MAU recently for this, which improved during her visit, however has since returned. Reports ongoing nausea and vomiting. Says she is taking her medications which she took this morning, however is unable to recall the time she took them or the names of these medications. She is not taking her scopolamine patch because she has not picked it up nor is she taking her suppositories because "they hurt". Also having ongoing lower back pain that she "has had for awhile now", but has not taken anything. She denies dizziness, headache, abdominal pain, vaginal bleeding or discharge.   Reports she last ate yesterday morning - oatmeal, which she reports she then threw up soon after. She reports she has only had water today.    Pregnancy Course:   Past Medical History:  Diagnosis Date   Depression    when going through hyperemesis with first preg, ok now   Hyperemesis gravidarum    OB History  Gravida Para Term Preterm AB Living  3 1 1   1 1   SAB IAB Ectopic Multiple Live Births  1     0 1    # Outcome Date GA Lbr Len/2nd Weight Sex Delivery Anes PTL Lv  3 Current           2 Term 04/25/18 [redacted]w[redacted]d 13:50 / 00:28 2980 g F Vag-Spont EPI  LIV  1 SAB 06/2014 [redacted]w[redacted]d          Past Surgical History:  Procedure Laterality Date   DILATION AND CURETTAGE OF UTERUS N/A 07/15/2014   Procedure: SUCTION DILATATION AND CURETTAGE;  Surgeon: 07/17/2014, MD;  Location: [facility];  Service: Gynecology;  Laterality: N/A;   Family History  Problem Relation Age of Onset   Healthy Mother    Aneurysm Father    Hypertension Maternal Grandmother    ADD / ADHD Neg Hx    Alcohol abuse Neg Hx    Anxiety disorder Neg  Hx    Arthritis Neg Hx    Birth defects Neg Hx    Asthma Neg Hx    Cancer Neg Hx    COPD Neg Hx    Depression Neg Hx    Diabetes Neg Hx    Drug abuse Neg Hx    Early death Neg Hx    Hearing loss Neg Hx    Heart disease Neg Hx    Hyperlipidemia Neg Hx    Intellectual disability Neg Hx    Kidney disease Neg Hx    Learning disabilities Neg Hx    Miscarriages / Stillbirths Neg Hx    Obesity Neg Hx    Stroke Neg Hx    Vision loss Neg Hx    Varicose Veins Neg Hx    Social History   Tobacco Use   Smoking status: Never   Smokeless tobacco: Never  Vaping Use   Vaping Use: Never used  Substance Use Topics   Alcohol use: No   Drug use: No   No Known Allergies No medications prior to admission.   I have reviewed patient's Past Medical Hx, Surgical Hx, Family Hx, Social Hx, medications and allergies.   ROS:  Review of Systems  Constitutional: Negative.  Respiratory: Negative.    Cardiovascular: Negative.   Gastrointestinal:  Positive for constipation, nausea and vomiting. Negative for abdominal pain and diarrhea.  Genitourinary: Negative.   Musculoskeletal:  Positive for back pain.  Neurological: Negative.   Psychiatric/Behavioral: Negative.     Physical Exam  Patient Vitals for the past 24 hrs:  BP Temp Temp src Pulse Resp SpO2 Weight  11/07/20 1949 132/73 -- -- 99 16 -- --  11/07/20 1735 129/83 -- -- 91 14 -- --  11/07/20 1546 122/85 -- -- (!) 105 -- 99 % --  11/07/20 1538 121/88 98.4 F (36.9 C) Oral (!) 105 16 100 % --  11/07/20 1536 -- -- -- -- -- -- 63.5 kg   Constitutional: well-developed, well-nourished female in no acute distress.  Cardiovascular: normal rate Respiratory: normal effort GI: abd soft, non-tender MS: extremities nontender, no edema, normal ROM Neurologic: alert and oriented x 4.  GU: Neg CVAT. Pelvic: deferred    FHT via doppler: 170 bpm   Labs: Results for orders placed or performed during the hospital encounter of 11/07/20 (from the  past 24 hour(s))  Urinalysis, Routine w reflex microscopic Urine, Clean Catch     Status: Abnormal   Collection Time: 11/07/20  3:13 PM  Result Value Ref Range   Color, Urine YELLOW YELLOW   APPearance HAZY (A) CLEAR   Specific Gravity, Urine 1.023 1.005 - 1.030   pH 6.0 5.0 - 8.0   Glucose, UA NEGATIVE NEGATIVE mg/dL   Hgb urine dipstick NEGATIVE NEGATIVE   Bilirubin Urine NEGATIVE NEGATIVE   Ketones, ur 80 (A) NEGATIVE mg/dL   Protein, ur 30 (A) NEGATIVE mg/dL   Nitrite NEGATIVE NEGATIVE   Leukocytes,Ua NEGATIVE NEGATIVE   RBC / HPF 0-5 0 - 5 RBC/hpf   WBC, UA 21-50 0 - 5 WBC/hpf   Bacteria, UA RARE (A) NONE SEEN   Squamous Epithelial / LPF 11-20 0 - 5   Mucus PRESENT   CBC     Status: Abnormal   Collection Time: 11/07/20  4:01 PM  Result Value Ref Range   WBC 6.7 4.0 - 10.5 K/uL   RBC 5.07 3.87 - 5.11 MIL/uL   Hemoglobin 13.3 12.0 - 15.0 g/dL   HCT 52.8 41.3 - 24.4 %   MCV 77.5 (L) 80.0 - 100.0 fL   MCH 26.2 26.0 - 34.0 pg   MCHC 33.8 30.0 - 36.0 g/dL   RDW 01.0 27.2 - 53.6 %   Platelets 244 150 - 400 K/uL   nRBC 0.0 0.0 - 0.2 %  Comprehensive metabolic panel     Status: Abnormal   Collection Time: 11/07/20  4:01 PM  Result Value Ref Range   Sodium 132 (L) 135 - 145 mmol/L   Potassium 3.4 (L) 3.5 - 5.1 mmol/L   Chloride 100 98 - 111 mmol/L   CO2 19 (L) 22 - 32 mmol/L   Glucose, Bld 90 70 - 99 mg/dL   BUN 13 6 - 20 mg/dL   Creatinine, Ser 6.44 0.44 - 1.00 mg/dL   Calcium 9.0 8.9 - 03.4 mg/dL   Total Protein 7.1 6.5 - 8.1 g/dL   Albumin 3.6 3.5 - 5.0 g/dL   AST 44 (H) 15 - 41 U/L   ALT 80 (H) 0 - 44 U/L   Alkaline Phosphatase 61 38 - 126 U/L   Total Bilirubin 2.2 (H) 0.3 - 1.2 mg/dL   GFR, Estimated >74 >25 mL/min   Anion gap 13 5 - 15  Imaging:  No results found.  MAU Course: Orders Placed This Encounter  Procedures   Urinalysis, Routine w reflex microscopic Urine, Clean Catch   CBC   Comprehensive metabolic panel   Discharge patient   Meds ordered  this encounter  Medications   promethazine (PHENERGAN) 25 mg in sodium chloride 0.9 % 1,000 mL infusion   scopolamine (TRANSDERM-SCOP) 1 MG/3DAYS 1.5 mg   lactated ringers bolus 1,000 mL    MDM: Phenergan IV infusion CBC, CMP Scopolamine patch applied Unable to leave urine sample so 2nd bag LR given Patient has not had any vomiting episodes during this entire visit. PO challenge, tolerating small sips of ginger ale. Nausea is manageable.  We discussed that nausea may not completely go away but the goal is to get nausea to a manageable level so that she is able to tolerate fluids/foods. We discussed advancing diet slowly and avoiding large meals.  I recommend that patient pick up her scopolamine patch rx as well as try to use suppositories with small amount of lube. I discussed the importance of taking medications on a schedule as patient is unsure of the time when she last took medications, but knows that she took them earlier this morning. Patient agrees that this is a reasonable plan.    Assessment: 1. Nausea and vomiting during pregnancy   2. [redacted] weeks gestation of pregnancy     Plan: Discharge home in stable condition  Strict return precautions given Patient to call [facility] to establish prenatal care   Follow-up Information     [facility] Follow up.   Specialty: Obstetrics and Gynecology Why: call to establish prenatal care. Return to MAU as needed. Contact information: [address] [place] [address] [phone]                Allergies as of 11/07/2020   No Known Allergies      Medication List     TAKE these medications    Doxylamine-Pyridoxine 10-10 MG Tbec Take 2 tabs at bedtime. If needed, add another tab in the morning. If needed, add another tab in the afternoon, up to 4 tabs/day.   famotidine 10 MG tablet Commonly known as: PEPCID Take 1 tablet (10 mg total) by mouth 2 (two) times daily.   methylPREDNISolone  8 MG tablet Commonly known as: MEDROL Day 1-5: 2 tabs TID, Day 6-7: 1 tab TID, Day 8-10 half tab TID, Day 11-12 1 tab q AM, Day 13-14: half tab q AM   ondansetron 8 MG disintegrating tablet Commonly known as: Zofran ODT Take 1 tablet (8 mg total) by mouth every 8 (eight) hours as needed for nausea or vomiting.   PRENATAL GUMMIES/DHA & FA PO Take 2 tablets by mouth daily.   prochlorperazine 10 MG tablet Commonly known as: COMPAZINE Take 1 tablet (10 mg total) by mouth in the morning and at bedtime.   prochlorperazine 25 MG suppository Commonly known as: COMPAZINE Place 1 suppository (25 mg total) rectally every 12 (twelve) hours as needed for nausea or vomiting.   promethazine 25 MG suppository Commonly known as: PHENERGAN Place 1 suppository (25 mg total) rectally every 6 (six) hours as needed for nausea or vomiting.   scopolamine 1 MG/3DAYS Commonly known as: TRANSDERM-SCOP Place 1 patch (1.5 mg total) onto the skin every 3 (three) days.         Camelia Eng, MSN, CNM 11/07/2020 8:15 PM

## 2020-11-09 ENCOUNTER — Other Ambulatory Visit: Payer: Self-pay

## 2020-11-09 ENCOUNTER — Ambulatory Visit (INDEPENDENT_AMBULATORY_CARE_PROVIDER_SITE_OTHER): Payer: Medicaid Other

## 2020-11-09 ENCOUNTER — Other Ambulatory Visit: Payer: Self-pay | Admitting: Obstetrics & Gynecology

## 2020-11-09 DIAGNOSIS — O3680X0 Pregnancy with inconclusive fetal viability, not applicable or unspecified: Secondary | ICD-10-CM

## 2020-11-09 DIAGNOSIS — Z3A13 13 weeks gestation of pregnancy: Secondary | ICD-10-CM

## 2020-11-09 NOTE — Progress Notes (Signed)
[place] 13 wks,CRL 58.89 mm,normal ovaries,anterior placenta gr 0,fhr 169 bpm,unable to obtain NT because of fetal position,NB present

## 2020-11-12 ENCOUNTER — Encounter (HOSPITAL_COMMUNITY): Payer: Self-pay | Admitting: Obstetrics and Gynecology

## 2020-11-12 ENCOUNTER — Inpatient Hospital Stay (HOSPITAL_COMMUNITY)
Admission: AD | Admit: 2020-11-12 | Discharge: 2020-11-13 | Disposition: A | Discharge status: Home / Self Care | Payer: Medicaid Other | Attending: Obstetrics and Gynecology | Admitting: Obstetrics and Gynecology

## 2020-11-12 DIAGNOSIS — Z3A13 13 weeks gestation of pregnancy: Secondary | ICD-10-CM | POA: Insufficient documentation

## 2020-11-12 DIAGNOSIS — O219 Vomiting of pregnancy, unspecified: Secondary | ICD-10-CM | POA: Insufficient documentation

## 2020-11-12 DIAGNOSIS — Z79899 Other long term (current) drug therapy: Secondary | ICD-10-CM | POA: Insufficient documentation

## 2020-11-12 DIAGNOSIS — O210 Mild hyperemesis gravidarum: Secondary | ICD-10-CM | POA: Diagnosis not present

## 2020-11-12 LAB — URINALYSIS, ROUTINE W REFLEX MICROSCOPIC
Bacteria, UA: NONE SEEN
Bilirubin Urine: NEGATIVE
Glucose, UA: NEGATIVE mg/dL
Hgb urine dipstick: NEGATIVE
Ketones, ur: 80 mg/dL — AB
Leukocytes,Ua: NEGATIVE
Nitrite: NEGATIVE
Protein, ur: 100 mg/dL — AB
Specific Gravity, Urine: 1.027 (ref 1.005–1.030)
pH: 6 (ref 5.0–8.0)

## 2020-11-12 MED ORDER — LACTATED RINGERS IV BOLUS
1000.0000 mL | Freq: Once | INTRAVENOUS | Status: AC
  Administered 2020-11-12: 1000 mL via INTRAVENOUS

## 2020-11-12 MED ORDER — SODIUM CHLORIDE 0.9 % IV SOLN
25.0000 mg | Freq: Once | INTRAVENOUS | Status: AC
  Administered 2020-11-13: 25 mg via INTRAVENOUS
  Filled 2020-11-12: qty 1

## 2020-11-12 NOTE — MAU Provider Note (Signed)
History     CSN: [id number]  Arrival date and time: 11/12/20 2122   Event Date/Time   First Provider Initiated Contact with Patient 11/12/20 2316      Chief Complaint  Patient presents with   Emesis   Deborah Evans is a 25 y.o. G3P1011 at [redacted]w[redacted]d who receives care at [facility].  She presents today for Emesis. She states she has had numerous bouts of vomiting today and last incident was prior to arrival.  She reports it was too many episodes to count.  Patient reports she has taken all her prescribed medications except her phenergan suppository. However, she is not sure exactly what meds she has taken and reports her last dose was around 3 or 4 pm.  She reports she ate oatmeal around 3pm and has been consuming water and sprite throughout the day.  She reports yesterday she had grapes as well as water and sprite.  While providing history, patient dry heaving, but having no vomiting.    OB History     Gravida  3   Para  1   Term  1   Preterm      AB  1   Living  1      SAB  1   IAB      Ectopic      Multiple  0   Live Births  1           Past Medical History:  Diagnosis Date   Depression    when going through hyperemesis with first preg, ok now   Hyperemesis gravidarum     Past Surgical History:  Procedure Laterality Date   DILATION AND CURETTAGE OF UTERUS N/A 07/15/2014   Procedure: SUCTION DILATATION AND CURETTAGE;  Surgeon: Lazaro Arms, MD;  Location: [facility];  Service: Gynecology;  Laterality: N/A;    Family History  Problem Relation Age of Onset   Healthy Mother    Aneurysm Father    Hypertension Maternal Grandmother    ADD / ADHD Neg Hx    Alcohol abuse Neg Hx    Anxiety disorder Neg Hx    Arthritis Neg Hx    Birth defects Neg Hx    Asthma Neg Hx    Cancer Neg Hx    COPD Neg Hx    Depression Neg Hx    Diabetes Neg Hx    Drug abuse Neg Hx    Early death Neg Hx    Hearing loss Neg Hx    Heart disease Neg Hx    Hyperlipidemia Neg Hx     Intellectual disability Neg Hx    Kidney disease Neg Hx    Learning disabilities Neg Hx    Miscarriages / Stillbirths Neg Hx    Obesity Neg Hx    Stroke Neg Hx    Vision loss Neg Hx    Varicose Veins Neg Hx     Social History   Tobacco Use   Smoking status: Never   Smokeless tobacco: Never  Vaping Use   Vaping Use: Never used  Substance Use Topics   Alcohol use: No   Drug use: No    Allergies: No Known Allergies  Medications Prior to Admission  Medication Sig Dispense Refill Last Dose   Doxylamine-Pyridoxine 10-10 MG TBEC Take 2 tabs at bedtime. If needed, add another tab in the morning. If needed, add another tab in the afternoon, up to 4 tabs/day. 120 tablet 0 11/12/2020   famotidine (PEPCID) 10  MG tablet Take 1 tablet (10 mg total) by mouth 2 (two) times daily. 60 tablet 0 11/12/2020   methylPREDNISolone (MEDROL) 8 MG tablet Day 1-5: 2 tabs TID, Day 6-7: 1 tab TID, Day 8-10 half tab TID, Day 11-12 1 tab q AM, Day 13-14: half tab q AM 41 tablet 0 11/12/2020   ondansetron (ZOFRAN ODT) 8 MG disintegrating tablet Take 1 tablet (8 mg total) by mouth every 8 (eight) hours as needed for nausea or vomiting. 20 tablet 0 11/12/2020   prochlorperazine (COMPAZINE) 25 MG suppository Place 1 suppository (25 mg total) rectally every 12 (twelve) hours as needed for nausea or vomiting. 12 suppository 0 11/12/2020   promethazine (PHENERGAN) 25 MG suppository Place 1 suppository (25 mg total) rectally every 6 (six) hours as needed for nausea or vomiting. 12 each 0 Past Week   Prenatal MV-Min-FA-Omega-3 (PRENATAL GUMMIES/DHA & FA PO) Take 2 tablets by mouth daily.       prochlorperazine (COMPAZINE) 10 MG tablet Take 1 tablet (10 mg total) by mouth in the morning and at bedtime. 60 tablet 1    scopolamine (TRANSDERM-SCOP) 1 MG/3DAYS Place 1 patch (1.5 mg total) onto the skin every 3 (three) days. 10 patch 12     Review of Systems  Gastrointestinal:  Positive for nausea and vomiting.  Physical Exam    Blood pressure 126/79, pulse (!) 135, temperature 97.9 F (36.6 C), temperature source Oral, resp. rate 20, height 5\' 8"  (1.727 m), weight 61.5 kg, last menstrual period 08/10/2020, SpO2 96 %, unknown if currently breastfeeding.  Physical Exam Constitutional:      Appearance: Normal appearance. She is ill-appearing.  HENT:     Head: Normocephalic and atraumatic.  Eyes:     Conjunctiva/sclera: Conjunctivae normal.  Cardiovascular:     Rate and Rhythm: Tachycardia present.  Pulmonary:     Effort: Pulmonary effort is normal. No respiratory distress.  Musculoskeletal:        General: Normal range of motion.     Cervical back: Normal range of motion.  Neurological:     Mental Status: She is alert and oriented to person, place, and time.  Psychiatric:        Mood and Affect: Mood normal.        Behavior: Behavior normal.        Thought Content: Thought content normal.    MAU Course  Procedures Results for orders placed or performed during the hospital encounter of 11/12/20 (from the past 24 hour(s))  Urinalysis, Routine w reflex microscopic Urine, Clean Catch     Status: Abnormal   Collection Time: 11/12/20 10:26 PM  Result Value Ref Range   Color, Urine AMBER (A) YELLOW   APPearance HAZY (A) CLEAR   Specific Gravity, Urine 1.027 1.005 - 1.030   pH 6.0 5.0 - 8.0   Glucose, UA NEGATIVE NEGATIVE mg/dL   Hgb urine dipstick NEGATIVE NEGATIVE   Bilirubin Urine NEGATIVE NEGATIVE   Ketones, ur 80 (A) NEGATIVE mg/dL   Protein, ur 11/14/20 (A) NEGATIVE mg/dL   Nitrite NEGATIVE NEGATIVE   Leukocytes,Ua NEGATIVE NEGATIVE   RBC / HPF 0-5 0 - 5 RBC/hpf   WBC, UA 11-20 0 - 5 WBC/hpf   Bacteria, UA NONE SEEN NONE SEEN   Squamous Epithelial / LPF 0-5 0 - 5   Mucus PRESENT     MDM Start IV LR Bolus  MVI Antiemetic Labs: UA Assessment and Plan  25 year old, G3P1011 SIUP at 68.4weeks HyperEmesis  -POC Reviewed. -  Patient with IV and initial dose of LR infusing. -Patient requests  and given soda-lemon/lime. -Will give phenergan and reassess.   [name] 11/12/2020, 11:16 PM   Reassessment (00:59 AM) -Patient sleeping. -Patient reports continued nausea despite soda and phenergan dosing. -Will give compazine now. -Plan to give additional bag of LR f/b MVI. -Will also give and reassess.   Reassessment (3:43 AM) -Patient sleeping. -Nurse reports patient without any incidents of vomiting since arrival.  -Nurse instructed to repeat vitals and plan for discharge. -Instruct patient to take medications once she is home. -Encourage to initiate bland diet and progress as tolerated. -Patient to follow up with primary office as scheduled.  -Encouraged to call primary office or return to MAU if symptoms worsen or with the onset of new symptoms. -Discharged to home in stable condition.   [name] MSN, CNM Advanced Practice Provider, [facility] [employer] [facility]

## 2020-11-12 NOTE — MAU Note (Signed)
PT SAYS LAST ATE - AT 5PM- OATMEAL-  HAS NAUSEA MEDS - COMPAZINE - AT 3 , DOXY- 3.   PHENERGAN - SUPP- , [facility] - WITH [facility] DID NOT CALL DR.

## 2020-11-13 MED ORDER — PROCHLORPERAZINE EDISYLATE 10 MG/2ML IJ SOLN
10.0000 mg | Freq: Once | INTRAMUSCULAR | Status: AC
  Administered 2020-11-13: 10 mg via INTRAVENOUS
  Filled 2020-11-13: qty 2

## 2020-11-13 MED ORDER — LACTATED RINGERS IV BOLUS
1000.0000 mL | Freq: Once | INTRAVENOUS | Status: DC
  Administered 2020-11-13: 1000 mL via INTRAVENOUS

## 2020-11-13 MED ORDER — M.V.I. ADULT IV INJ
Freq: Once | INTRAVENOUS | Status: AC
  Filled 2020-11-13: qty 10

## 2020-11-13 NOTE — MAU Note (Signed)
Pt states she remains overall feeling unwell-declined crackers-is tolerating clear liquids without vomiting. States nausea continues. FHR with doppler [phone].

## 2020-11-13 NOTE — MAU Note (Signed)
Pt reports nausea has not improved. No episodes of vomiting since arrival.

## 2020-11-17 ENCOUNTER — Other Ambulatory Visit: Payer: Self-pay | Admitting: *Deleted

## 2020-11-17 ENCOUNTER — Encounter (HOSPITAL_COMMUNITY): Payer: Self-pay | Admitting: Obstetrics and Gynecology

## 2020-11-17 ENCOUNTER — Other Ambulatory Visit: Payer: Self-pay

## 2020-11-17 ENCOUNTER — Inpatient Hospital Stay (HOSPITAL_COMMUNITY)
Admission: AD | Admit: 2020-11-17 | Discharge: 2020-11-17 | Disposition: A | Discharge status: Home / Self Care | Payer: Medicaid Other | Attending: Obstetrics and Gynecology | Admitting: Obstetrics and Gynecology

## 2020-11-17 DIAGNOSIS — Z79899 Other long term (current) drug therapy: Secondary | ICD-10-CM | POA: Diagnosis not present

## 2020-11-17 DIAGNOSIS — O211 Hyperemesis gravidarum with metabolic disturbance: Secondary | ICD-10-CM | POA: Diagnosis not present

## 2020-11-17 DIAGNOSIS — O2341 Unspecified infection of urinary tract in pregnancy, first trimester: Secondary | ICD-10-CM | POA: Diagnosis not present

## 2020-11-17 DIAGNOSIS — N390 Urinary tract infection, site not specified: Secondary | ICD-10-CM | POA: Diagnosis not present

## 2020-11-17 DIAGNOSIS — O2342 Unspecified infection of urinary tract in pregnancy, second trimester: Secondary | ICD-10-CM | POA: Diagnosis not present

## 2020-11-17 DIAGNOSIS — Z3A14 14 weeks gestation of pregnancy: Secondary | ICD-10-CM

## 2020-11-17 DIAGNOSIS — E876 Hypokalemia: Secondary | ICD-10-CM

## 2020-11-17 LAB — BASIC METABOLIC PANEL
Anion gap: 11 (ref 5–15)
BUN: 11 mg/dL (ref 6–20)
CO2: 24 mmol/L (ref 22–32)
Calcium: 9.5 mg/dL (ref 8.9–10.3)
Chloride: 102 mmol/L (ref 98–111)
Creatinine, Ser: 0.56 mg/dL (ref 0.44–1.00)
GFR, Estimated: >60 mL/min (ref >60)
Glucose, Bld: 108 mg/dL — ABNORMAL HIGH (ref 70–99)
Potassium: 3.1 mmol/L — ABNORMAL LOW (ref 3.5–5.1)
Sodium: 137 mmol/L (ref 135–145)

## 2020-11-17 LAB — URINALYSIS, ROUTINE W REFLEX MICROSCOPIC
Glucose, UA: NEGATIVE mg/dL
Ketones, ur: >80 mg/dL — AB
Nitrite: POSITIVE — AB
Protein, ur: 100 mg/dL — AB
Specific Gravity, Urine: 1.025 (ref 1.005–1.030)
pH: 6.5 (ref 5.0–8.0)

## 2020-11-17 LAB — URINALYSIS, MICROSCOPIC (REFLEX): WBC, UA: >50 WBC/hpf (ref 0–5)

## 2020-11-17 MED ORDER — SCOPOLAMINE 1 MG/3DAYS TD PT72
1.0000 | MEDICATED_PATCH | Freq: Once | TRANSDERMAL | Status: DC
  Administered 2020-11-17: 1.5 mg via TRANSDERMAL
  Filled 2020-11-17: qty 1

## 2020-11-17 MED ORDER — SODIUM CHLORIDE 0.9 % IV BOLUS
1000.0000 mL | Freq: Once | INTRAVENOUS | Status: AC
  Administered 2020-11-17: 1000 mL via INTRAVENOUS

## 2020-11-17 MED ORDER — POTASSIUM CHLORIDE 10 MEQ/100ML IV SOLN
10.0000 meq | INTRAVENOUS | Status: AC
  Administered 2020-11-17 (×2): 10 meq via INTRAVENOUS
  Filled 2020-11-17 (×2): qty 100

## 2020-11-17 MED ORDER — SODIUM CHLORIDE 0.9 % IV SOLN
8.0000 mg | Freq: Once | INTRAVENOUS | Status: AC
  Administered 2020-11-17: 8 mg via INTRAVENOUS
  Filled 2020-11-17: qty 4

## 2020-11-17 MED ORDER — ONDANSETRON 8 MG PO TBDP: 8.0000 mg | ORAL_TABLET | Freq: Three times a day (TID) | ORAL | 0 refills | Status: DC | PRN

## 2020-11-17 MED ORDER — LACTATED RINGERS IV BOLUS
1000.0000 mL | Freq: Once | INTRAVENOUS | Status: AC
  Administered 2020-11-17: 1000 mL via INTRAVENOUS

## 2020-11-17 MED ORDER — POTASSIUM CHLORIDE 20 MEQ PO PACK: 20.0000 meq | PACK | Freq: Every day | ORAL | 0 refills | Status: DC

## 2020-11-17 MED ORDER — PROMETHAZINE HCL 25 MG PO TABS: 25.0000 mg | ORAL_TABLET | Freq: Four times a day (QID) | ORAL | 1 refills | Status: DC | PRN

## 2020-11-17 MED ORDER — SODIUM CHLORIDE 0.9 % IV SOLN
2.0000 g | Freq: Once | INTRAVENOUS | Status: AC
  Administered 2020-11-17: 2 g via INTRAVENOUS
  Filled 2020-11-17: qty 2

## 2020-11-17 MED ORDER — SODIUM CHLORIDE 0.9 % IV SOLN
25.0000 mg | Freq: Once | INTRAVENOUS | Status: AC
  Administered 2020-11-17: 25 mg via INTRAVENOUS
  Filled 2020-11-17: qty 1

## 2020-11-17 MED ORDER — FAMOTIDINE IN NACL 20-0.9 MG/50ML-% IV SOLN
20.0000 mg | Freq: Once | INTRAVENOUS | Status: AC
  Administered 2020-11-17: 20 mg via INTRAVENOUS
  Filled 2020-11-17: qty 50

## 2020-11-17 NOTE — MAU Provider Note (Addendum)
History   CSN: [id number]  Arrival date and time: 11/17/20 0745   Event Date/Time   First Provider Initiated Contact with Patient 11/17/20 [phone]     Chief Complaint  Patient presents with   Emesis   Nausea   HPI  Deborah Evans is a 25 year old female with PMH significant for depression and hyperemesis gravidarum who presents to MAU today due to nausea and vomiting. Patient reports that she has had issues with this in her previous pregnancy as well. She is now unable to keep any food or drink down for the last 3 days. She reports vomiting approximately every 30 minutes last evening. She denies abdominal pain. No vaginal bleeding. She does report that her urine is dark and she has some burning after she urinates. She denies fever or congestion. She has not had any known sick contacts. She has tried taking Compazine and Zofran this morning orally but she subsequently vomited up her medications and did not have any relief. She has not had relief from Scopolamine patches. She has tried suppositories as well (Phenergan) which she takes every 6 hours at home without relief. She feels that her symptoms are worsening overall. She reports not having a bowel movement in several days. She states that she did have relief with IV Compazine at her last visit to MAU. She is here today to find something else that will help with her symptoms.  OB History     Gravida  3   Para  1   Term  1   Preterm      AB  1   Living  1      SAB  1   IAB      Ectopic      Multiple  0   Live Births  1           Past Medical History:  Diagnosis Date   Depression    when going through hyperemesis with first preg, ok now   Hyperemesis gravidarum     Past Surgical History:  Procedure Laterality Date   DILATION AND CURETTAGE OF UTERUS N/A 07/15/2014   Procedure: SUCTION DILATATION AND CURETTAGE;  Surgeon: Lazaro Arms, MD;  Location: [facility];  Service: Gynecology;  Laterality: N/A;    Family  History  Problem Relation Age of Onset   Healthy Mother    Aneurysm Father    Hypertension Maternal Grandmother    ADD / ADHD Neg Hx    Alcohol abuse Neg Hx    Anxiety disorder Neg Hx    Arthritis Neg Hx    Birth defects Neg Hx    Asthma Neg Hx    Cancer Neg Hx    COPD Neg Hx    Depression Neg Hx    Diabetes Neg Hx    Drug abuse Neg Hx    Early death Neg Hx    Hearing loss Neg Hx    Heart disease Neg Hx    Hyperlipidemia Neg Hx    Intellectual disability Neg Hx    Kidney disease Neg Hx    Learning disabilities Neg Hx    Miscarriages / Stillbirths Neg Hx    Obesity Neg Hx    Stroke Neg Hx    Vision loss Neg Hx    Varicose Veins Neg Hx     Social History   Tobacco Use   Smoking status: Never   Smokeless tobacco: Never  Vaping Use   Vaping Use: Never used  Substance Use Topics   Alcohol use: No   Drug use: No    Allergies: No Known Allergies  Medications Prior to Admission  Medication Sig Dispense Refill Last Dose   Doxylamine-Pyridoxine 10-10 MG TBEC Take 2 tabs at bedtime. If needed, add another tab in the morning. If needed, add another tab in the afternoon, up to 4 tabs/day. 120 tablet 0 11/17/2020   famotidine (PEPCID) 10 MG tablet Take 1 tablet (10 mg total) by mouth 2 (two) times daily. 60 tablet 0 11/17/2020   ondansetron (ZOFRAN ODT) 8 MG disintegrating tablet Take 1 tablet (8 mg total) by mouth every 8 (eight) hours as needed for nausea or vomiting. 20 tablet 0 11/17/2020   Prenatal MV-Min-FA-Omega-3 (PRENATAL GUMMIES/DHA & FA PO) Take 2 tablets by mouth daily.    11/16/2020   prochlorperazine (COMPAZINE) 10 MG tablet Take 1 tablet (10 mg total) by mouth in the morning and at bedtime. 60 tablet 1 11/17/2020   promethazine (PHENERGAN) 25 MG suppository Place 1 suppository (25 mg total) rectally every 6 (six) hours as needed for nausea or vomiting. 12 each 0 11/17/2020   scopolamine (TRANSDERM-SCOP) 1 MG/3DAYS Place 1 patch (1.5 mg total) onto the skin every 3  (three) days. 10 patch 12 Past Week   methylPREDNISolone (MEDROL) 8 MG tablet Day 1-5: 2 tabs TID, Day 6-7: 1 tab TID, Day 8-10 half tab TID, Day 11-12 1 tab q AM, Day 13-14: half tab q AM 41 tablet 0 Unknown   prochlorperazine (COMPAZINE) 25 MG suppository Place 1 suppository (25 mg total) rectally every 12 (twelve) hours as needed for nausea or vomiting. 12 suppository 0 Unknown    Review of Systems  Constitutional:  Negative for fever.  HENT:  Positive for sore throat (from vomiting). Negative for congestion.   Respiratory:  Negative for shortness of breath.   Cardiovascular:  Negative for chest pain and leg swelling.  Gastrointestinal:  Positive for constipation, nausea and vomiting. Negative for abdominal pain and diarrhea.  Genitourinary:  Positive for dysuria. Negative for hematuria and vaginal bleeding.  Skin:  Negative for rash.  Neurological:  Positive for weakness (generalized).  Psychiatric/Behavioral:  Negative for confusion.    Physical Exam   Blood pressure 127/84, pulse 97, temperature 97.7 F (36.5 C), temperature source Oral, resp. rate 18, height 5\' 8"  (1.727 m), weight 61.6 kg, last menstrual period 08/10/2020, SpO2 100 %, unknown if currently breastfeeding.  FHT - 149 bpm  Physical Exam Constitutional:      Appearance: She is not toxic-appearing.  HENT:     Head: Normocephalic and atraumatic.     Nose: Nose normal.     Mouth/Throat:     Mouth: Mucous membranes are dry.  Eyes:     General: No scleral icterus.    Conjunctiva/sclera: Conjunctivae normal.  Cardiovascular:     Rate and Rhythm: Regular rhythm. Tachycardia present.     Heart sounds: No murmur heard. Pulmonary:     Effort: Pulmonary effort is normal.     Breath sounds: Normal breath sounds.  Abdominal:     General: Abdomen is flat. Bowel sounds are normal.     Palpations: Abdomen is soft.     Tenderness: There is no abdominal tenderness.  Musculoskeletal:     Cervical back: Normal range of  motion.     Right lower leg: No edema.     Left lower leg: No edema.  Skin:    General: Skin is warm and dry.  Neurological:  Mental Status: She is alert and oriented to person, place, and time.  Psychiatric:        Mood and Affect: Mood normal.        Behavior: Behavior normal.    MAU Course   MDM Patient presents with several days of nausea and vomiting with minimal PO intake despite using multiple prescribed antiemetics. She is mildly tachycardic upon arrival - otherwise hemodynamically stable. Her weight is overall stable since last visit to MAU on 11/12/20 (61.5 kg --> 61.6 kg). No abdominal pain or vaginal bleeding. Will administer 1L of LR, IV Phenergan 25 mg, and IV Pepcid 20 mg. UA and BMP ordered.  9379 - UA with + nitrites and leukocytes. Consistent with UTI. Will treat with 2 g of Rocephin. Will give with 1L NS.  1017 - BMP with low potassium of 3.1, glucose 108, and otherwise normal renal function and additional electrolytes.   1114 - Able to tolerated a few sips of ginger ale but developed some abdominal discomfort. Will try water instead and saltines.   1150 - Patient continues to experience nausea and has been unable to attempt eating crackers due to this. Minimal PO fluid intake since arrival. Plan to trial IV Zofran and Scopolamine patch. Will give 1L of LR and 10 mEq of potassium as well.  1153 - Care transitioned to Judeth Horn, NP. Please refer to further notes regarding additional assessment and final disposition.   Assessment and Plan  Nausea and vomiting in pregnancy  Hypokalemia  UTI in pregnancy  Patient requires additional treatment in the MAU at this time. Pending additional IVF, potassium, and antiemetics. Care transitioned to Judeth Horn, NP at 1153.  Evalina Field, MD  Obstetrics Fellow 11/17/2020, 1152 AM     Patient no longer vomiting & tolerated oral fluid intake. Will discharge home with refill for her antiemetics.    1.  Hyperemesis gravidarum before end of [redacted] week gestation with electrolyte imbalance  -refill for all antiemetics given  2. Hypokalemia  -received 20 meq IV in MAU. Will prescribe KClor packets to total 40 meq  3. Urinary tract infection in mother during second trimester of pregnancy  -given rocephin 2 gm IV in MAU -will defer oral abx at this time. Urine culture pending  4. [redacted] weeks gestation of pregnancy    Judeth Horn, NP

## 2020-11-17 NOTE — MAU Note (Signed)
Presents c/o N/V and inability to keep anything down.  Reports it's been 3 days.  Has meds prescribed but they're not working, last took this morning and didn't stay down.

## 2020-11-18 LAB — CULTURE, OB URINE: Culture: 70000 — AB

## 2020-11-19 ENCOUNTER — Inpatient Hospital Stay (HOSPITAL_COMMUNITY): Payer: Medicaid Other

## 2020-11-19 ENCOUNTER — Encounter (HOSPITAL_COMMUNITY): Payer: Self-pay | Admitting: Obstetrics & Gynecology

## 2020-11-19 ENCOUNTER — Inpatient Hospital Stay (HOSPITAL_COMMUNITY)
Admission: AD | Admit: 2020-11-19 | Discharge: 2020-11-20 | Disposition: A | Discharge status: Home / Self Care | Payer: Medicaid Other | Attending: Obstetrics & Gynecology | Admitting: Obstetrics & Gynecology

## 2020-11-19 ENCOUNTER — Other Ambulatory Visit: Payer: Self-pay | Admitting: Student

## 2020-11-19 DIAGNOSIS — O210 Mild hyperemesis gravidarum: Secondary | ICD-10-CM | POA: Diagnosis present

## 2020-11-19 DIAGNOSIS — K8020 Calculus of gallbladder without cholecystitis without obstruction: Secondary | ICD-10-CM | POA: Diagnosis not present

## 2020-11-19 DIAGNOSIS — O26612 Liver and biliary tract disorders in pregnancy, second trimester: Secondary | ICD-10-CM | POA: Diagnosis not present

## 2020-11-19 DIAGNOSIS — M545 Low back pain, unspecified: Secondary | ICD-10-CM | POA: Insufficient documentation

## 2020-11-19 DIAGNOSIS — R748 Abnormal levels of other serum enzymes: Secondary | ICD-10-CM

## 2020-11-19 DIAGNOSIS — Z3A14 14 weeks gestation of pregnancy: Secondary | ICD-10-CM | POA: Diagnosis not present

## 2020-11-19 DIAGNOSIS — R350 Frequency of micturition: Secondary | ICD-10-CM | POA: Insufficient documentation

## 2020-11-19 DIAGNOSIS — Z79899 Other long term (current) drug therapy: Secondary | ICD-10-CM | POA: Insufficient documentation

## 2020-11-19 DIAGNOSIS — O26892 Other specified pregnancy related conditions, second trimester: Secondary | ICD-10-CM | POA: Insufficient documentation

## 2020-11-19 DIAGNOSIS — R109 Unspecified abdominal pain: Secondary | ICD-10-CM | POA: Diagnosis not present

## 2020-11-19 DIAGNOSIS — O99612 Diseases of the digestive system complicating pregnancy, second trimester: Secondary | ICD-10-CM | POA: Diagnosis not present

## 2020-11-19 LAB — COMPREHENSIVE METABOLIC PANEL
ALT: 146 U/L — ABNORMAL HIGH (ref 0–44)
AST: 75 U/L — ABNORMAL HIGH (ref 15–41)
Albumin: 3.1 g/dL — ABNORMAL LOW (ref 3.5–5.0)
Alkaline Phosphatase: 71 U/L (ref 38–126)
Anion gap: 12 (ref 5–15)
BUN: 7 mg/dL (ref 6–20)
CO2: 22 mmol/L (ref 22–32)
Calcium: 9.2 mg/dL (ref 8.9–10.3)
Chloride: 102 mmol/L (ref 98–111)
Creatinine, Ser: 0.6 mg/dL (ref 0.44–1.00)
GFR, Estimated: >60 mL/min (ref >60)
Glucose, Bld: 99 mg/dL (ref 70–99)
Potassium: 3.2 mmol/L — ABNORMAL LOW (ref 3.5–5.1)
Sodium: 136 mmol/L (ref 135–145)
Total Bilirubin: 1.3 mg/dL — ABNORMAL HIGH (ref 0.3–1.2)
Total Protein: 6.9 g/dL (ref 6.5–8.1)

## 2020-11-19 LAB — URINALYSIS, ROUTINE W REFLEX MICROSCOPIC
Bacteria, UA: NONE SEEN
Bilirubin Urine: NEGATIVE
Glucose, UA: NEGATIVE mg/dL
Hgb urine dipstick: NEGATIVE
Ketones, ur: 80 mg/dL — AB
Leukocytes,Ua: NEGATIVE
Nitrite: NEGATIVE
Protein, ur: 100 mg/dL — AB
Specific Gravity, Urine: 1.02 (ref 1.005–1.030)
pH: 6 (ref 5.0–8.0)

## 2020-11-19 LAB — CBC
HCT: 37 % (ref 36.0–46.0)
Hemoglobin: 12.5 g/dL (ref 12.0–15.0)
MCH: 26.7 pg (ref 26.0–34.0)
MCHC: 33.8 g/dL (ref 30.0–36.0)
MCV: 79.1 fL — ABNORMAL LOW (ref 80.0–100.0)
Platelets: 180 10*3/uL (ref 150–400)
RBC: 4.68 MIL/uL (ref 3.87–5.11)
RDW: 13.7 % (ref 11.5–15.5)
WBC: 7.4 10*3/uL (ref 4.0–10.5)
nRBC: 0 % (ref 0.0–0.2)

## 2020-11-19 LAB — LIPASE, BLOOD: Lipase: 52 U/L — ABNORMAL HIGH (ref 11–51)

## 2020-11-19 LAB — AMYLASE: Amylase: 183 U/L — ABNORMAL HIGH (ref 28–100)

## 2020-11-19 LAB — TSH: TSH: <0.01 u[IU]/mL — ABNORMAL LOW (ref 0.350–4.500)

## 2020-11-19 MED ORDER — METHYLPREDNISOLONE 8 MG PO TABS
ORAL_TABLET | ORAL | 0 refills | Status: DC
Start: 2020-11-19 — End: 2020-11-30

## 2020-11-19 MED ORDER — ONDANSETRON 4 MG PO TBDP
8.0000 mg | ORAL_TABLET | Freq: Once | ORAL | Status: AC
  Administered 2020-11-19: 8 mg via ORAL
  Filled 2020-11-19: qty 2

## 2020-11-19 MED ORDER — LACTATED RINGERS IV BOLUS
1000.0000 mL | Freq: Once | INTRAVENOUS | Status: AC
  Administered 2020-11-19: 1000 mL via INTRAVENOUS

## 2020-11-19 MED ORDER — METHYLPREDNISOLONE SODIUM SUCC 125 MG IJ SOLR
48.0000 mg | Freq: Once | INTRAMUSCULAR | Status: AC
  Administered 2020-11-19: 48 mg via INTRAVENOUS
  Filled 2020-11-19: qty 2

## 2020-11-19 NOTE — MAU Note (Signed)
Has been having N/D for several days. Has had morning sickness throughout her pregnancy. It has gotten worse over the past 2 days.  Have taken all her regular anti nausea medications but was not able to keep them down.

## 2020-11-19 NOTE — MAU Provider Note (Addendum)
History     CSN: [id number]  Arrival date and time: 11/19/20 1305   Event Date/Time   First Provider Initiated Contact with Patient 11/19/20 1859      Chief Complaint  Patient presents with   Emesis   HPI  Deborah Evans is a 25 year old female G3P1011 [id number] who presents with main complaint of nausea and vomiting for the past 3 days. She has struggled to keep anything down for the past couple of weeks, but reports that it has been 3 days since she has been able to eat or drink without vomiting immediately. Tried taking her prescription compazine 10mg  and phenergan 25mg  this morning, but states she was not able to keep it down. Currently has scopolamine patch on, but states it has not helped with symptoms. Reports decreased frequency of urination, "orange urine," and low back pain associated with vomiting. She last tried to eat potatoes while waiting to be seen in MAU and vomited after intake. Reports history of hyperemesis gravidarum in previous pregnancy that required inpatient management. She has been seen multiple times this pregnancy for same complaint. Denies abdominal cramping, vaginal bleeding, vaginal discharge, and further complaints.   OB History     Gravida  3   Para  1   Term  1   Preterm      AB  1   Living  1      SAB  1   IAB      Ectopic      Multiple  0   Live Births  1           Past Medical History:  Diagnosis Date   Depression    when going through hyperemesis with first preg, ok now   Hyperemesis gravidarum     Past Surgical History:  Procedure Laterality Date   DILATION AND CURETTAGE OF UTERUS N/A 07/15/2014   Procedure: SUCTION DILATATION AND CURETTAGE;  Surgeon: Lazaro ArmsLuther H Eure, MD;  Location: [facility];  Service: Gynecology;  Laterality: N/A;    Family History  Problem Relation Age of Onset   Healthy Mother    Aneurysm Father    Hypertension Maternal Grandmother    ADD / ADHD Neg Hx    Alcohol abuse Neg Hx    Anxiety disorder  Neg Hx    Arthritis Neg Hx    Birth defects Neg Hx    Asthma Neg Hx    Cancer Neg Hx    COPD Neg Hx    Depression Neg Hx    Diabetes Neg Hx    Drug abuse Neg Hx    Early death Neg Hx    Hearing loss Neg Hx    Heart disease Neg Hx    Hyperlipidemia Neg Hx    Intellectual disability Neg Hx    Kidney disease Neg Hx    Learning disabilities Neg Hx    Miscarriages / Stillbirths Neg Hx    Obesity Neg Hx    Stroke Neg Hx    Vision loss Neg Hx    Varicose Veins Neg Hx     Social History   Tobacco Use   Smoking status: Never   Smokeless tobacco: Never  Vaping Use   Vaping Use: Never used  Substance Use Topics   Alcohol use: No   Drug use: No    Allergies: No Known Allergies  Medications Prior to Admission  Medication Sig Dispense Refill Last Dose   famotidine (PEPCID) 10 MG tablet Take 1 tablet (  10 mg total) by mouth 2 (two) times daily. 60 tablet 0 11/19/2020   methylPREDNISolone (MEDROL) 8 MG tablet Day 1-5: 2 tabs TID, Day 6-7: 1 tab TID, Day 8-10 half tab TID, Day 11-12 1 tab q AM, Day 13-14: half tab q AM 41 tablet 0 11/19/2020   ondansetron (ZOFRAN ODT) 8 MG disintegrating tablet Take 1 tablet (8 mg total) by mouth every 8 (eight) hours as needed for refractory nausea / vomiting. 30 tablet 0 11/19/2020   prochlorperazine (COMPAZINE) 10 MG tablet Take 1 tablet (10 mg total) by mouth in the morning and at bedtime. 60 tablet 1 11/19/2020   Doxylamine-Pyridoxine 10-10 MG TBEC Take 2 tabs at bedtime. If needed, add another tab in the morning. If needed, add another tab in the afternoon, up to 4 tabs/day. 120 tablet 0    potassium chloride (KLOR-CON) 20 MEQ packet Take 20 mEq by mouth daily for 2 days. 2 packet 0    Prenatal MV-Min-FA-Omega-3 (PRENATAL GUMMIES/DHA & FA PO) Take 2 tablets by mouth daily.       promethazine (PHENERGAN) 25 MG suppository Place 1 suppository (25 mg total) rectally every 6 (six) hours as needed for nausea or vomiting. 12 each 0    promethazine  (PHENERGAN) 25 MG tablet Take 1 tablet (25 mg total) by mouth every 6 (six) hours as needed for nausea or vomiting. 30 tablet 1    scopolamine (TRANSDERM-SCOP) 1 MG/3DAYS Place 1 patch (1.5 mg total) onto the skin every 3 (three) days. 10 patch 12     Review of Systems  Constitutional: Negative.   HENT: Negative.    Gastrointestinal:  Positive for nausea and vomiting. Negative for abdominal pain.  Genitourinary:  Positive for decreased urine volume. Negative for vaginal bleeding, vaginal discharge and vaginal pain.  Musculoskeletal:  Positive for back pain.       Related to vomiting and position  Neurological: Negative.      Physical Exam   Blood pressure (!) 141/84, pulse 92, temperature 98.1 F (36.7 C), resp. rate 18, height 5\' 8"  (1.727 m), weight 62.6 kg, last menstrual period 08/10/2020, unknown if currently breastfeeding.  Physical Exam Constitutional:      Appearance: Normal appearance.  Pulmonary:     Effort: Pulmonary effort is normal.  Musculoskeletal:        General: Normal range of motion.     Cervical back: Normal range of motion.  Skin:    General: Skin is warm.  Neurological:     Mental Status: She is alert.    Patient was hunched over during initial assessment and was not able to be examined.    MAU Course  Procedures  Results for orders placed or performed during the hospital encounter of 11/19/20 (from the past 24 hour(s))  Urinalysis, Routine w reflex microscopic Urine, Clean Catch     Status: Abnormal   Collection Time: 11/19/20  5:40 PM  Result Value Ref Range   Color, Urine AMBER (A) YELLOW   APPearance HAZY (A) CLEAR   Specific Gravity, Urine 1.020 1.005 - 1.030   pH 6.0 5.0 - 8.0   Glucose, UA NEGATIVE NEGATIVE mg/dL   Hgb urine dipstick NEGATIVE NEGATIVE   Bilirubin Urine NEGATIVE NEGATIVE   Ketones, ur 80 (A) NEGATIVE mg/dL   Protein, ur 11/21/20 (A) NEGATIVE mg/dL   Nitrite NEGATIVE NEGATIVE   Leukocytes,Ua NEGATIVE NEGATIVE   RBC / HPF  0-5 0 - 5 RBC/hpf   WBC, UA 11-20 0 - 5 WBC/hpf  Bacteria, UA NONE SEEN NONE SEEN   Squamous Epithelial / LPF 0-5 0 - 5   Mucus PRESENT    Hyaline Casts, UA PRESENT   CBC     Status: Abnormal   Collection Time: 11/19/20  7:26 PM  Result Value Ref Range   WBC 7.4 4.0 - 10.5 K/uL   RBC 4.68 3.87 - 5.11 MIL/uL   Hemoglobin 12.5 12.0 - 15.0 g/dL   HCT 69.4 50.3 - 88.8 %   MCV 79.1 (L) 80.0 - 100.0 fL   MCH 26.7 26.0 - 34.0 pg   MCHC 33.8 30.0 - 36.0 g/dL   RDW 28.0 03.4 - 91.7 %   Platelets 180 150 - 400 K/uL   nRBC 0.0 0.0 - 0.2 %  Comprehensive metabolic panel     Status: Abnormal   Collection Time: 11/19/20  7:26 PM  Result Value Ref Range   Sodium 136 135 - 145 mmol/L   Potassium 3.2 (L) 3.5 - 5.1 mmol/L   Chloride 102 98 - 111 mmol/L   CO2 22 22 - 32 mmol/L   Glucose, Bld 99 70 - 99 mg/dL   BUN 7 6 - 20 mg/dL   Creatinine, Ser 9.15 0.44 - 1.00 mg/dL   Calcium 9.2 8.9 - 05.6 mg/dL   Total Protein 6.9 6.5 - 8.1 g/dL   Albumin 3.1 (L) 3.5 - 5.0 g/dL   AST 75 (H) 15 - 41 U/L   ALT 146 (H) 0 - 44 U/L   Alkaline Phosphatase 71 38 - 126 U/L   Total Bilirubin 1.3 (H) 0.3 - 1.2 mg/dL   GFR, Estimated >97 >94 mL/min   Anion gap 12 5 - 15     MDM N/V interventions: 1 Liter LR IV, 8mg  ODT zofran Patient does not have a confirmed ride home, so 8mg  Zofran given instead of phenergan.    Seen in MAU 2 days ago on 11/17/20 and diagnosed with hypokalemia (3.1), UTI in pregnancy, and hyperemesis gravidarum. Given rocephin 2gm IV and 20 meq IV potassium. Sent home with antiemetics, KClor packets ( ), and oral antibiotics.       Assessment and Plan    11/19/20 [facility] 11/19/2020, 8:32 PM    CNM attestation:  I have seen and examined this patient and agree with above documentation in the s note.   Deborah Evans 11/21/2020 is a 25 y.o. G3P1011 at [redacted]w[redacted]d reporting ongoing nausea and vomiting.   Denies LOF, VB, contractions, vaginal discharge.  PE: Patient Vitals for the  past 24 hrs:  BP Temp Pulse Resp Height Weight  11/19/20 1526 (!) 141/84 98.1 F (36.7 C) 92 18 5\' 8"  (1.727 m) 62.6 kg   Gen: calm comfortable, NAD Resp: normal effort, no distress Heart: Regular rate  FHR: 153 Toco: not done  ROS, labs, PMH reviewed  Orders Placed This Encounter  Procedures   Urinalysis, Routine w reflex microscopic Urine, Clean Catch   CBC   Comprehensive metabolic panel   Insert peripheral IV   Meds ordered this encounter  Medications   lactated ringers bolus 1,000 mL   ondansetron (ZOFRAN-ODT) disintegrating tablet 8 mg    MDM -patient had IV and Zofran, she is tolerating sips of ginger ale.  -reviewed LFTs with Dr. [redacted]w[redacted]d, who recommends RUQ 11/21/20, given elevated LFTS. Will also check amylase and lipase and TSH  Will also give first dose of steroids, and then have her pick up RX for steroid taper. -Reviewed weight loss and patient has not lost  more than 5% of her body weight    Patient care endorsed to Arita Miss, [place] at 2053  Marylene Land, [place] 11/19/2020 8:32 PM  *Consult with Dr. Despina Hidden @ [phone] - notified of patient's complaints, assessments, lab & U/S results, recommended tx plan d/c home, will have general surgeon at [facility] to remove gallbladder, advise her to take Phenergan on schedule because it will help relax the smooth muscles of the gallbladder. - ok to d/c home, agrees with plan  Assessment & Plan: Calculus of gallbladder without cholecystitis without obstruction  - Advised of gallstones found on U/S - Information provided on gallstones  - Notified that Dr. Despina Hidden will have general surgeon remove her gall bladder. Advised to call FT next week to make sure that has been coordinated   [redacted] weeks gestation of pregnancy  - Discharge patient - Keep scheduled appointment at [facility] - Patient verbalized an understanding of the plan of care and agrees.   Raelyn Mora, CNM  11/20/2020 1:00 AM

## 2020-11-20 ENCOUNTER — Encounter (HOSPITAL_COMMUNITY): Payer: Self-pay | Admitting: Obstetrics & Gynecology

## 2020-11-20 DIAGNOSIS — Z3A14 14 weeks gestation of pregnancy: Secondary | ICD-10-CM | POA: Diagnosis not present

## 2020-11-20 DIAGNOSIS — K8020 Calculus of gallbladder without cholecystitis without obstruction: Secondary | ICD-10-CM | POA: Diagnosis not present

## 2020-11-20 DIAGNOSIS — O99612 Diseases of the digestive system complicating pregnancy, second trimester: Secondary | ICD-10-CM

## 2020-11-20 NOTE — Discharge Instructions (Signed)
Please take Phenergan regularly because it will help relax the smooth muscles of your gallbladder.

## 2020-11-23 ENCOUNTER — Other Ambulatory Visit: Payer: Self-pay | Admitting: *Deleted

## 2020-11-23 DIAGNOSIS — R748 Abnormal levels of other serum enzymes: Secondary | ICD-10-CM

## 2020-11-24 ENCOUNTER — Inpatient Hospital Stay (HOSPITAL_COMMUNITY): Payer: Medicaid Other

## 2020-11-24 ENCOUNTER — Encounter (HOSPITAL_COMMUNITY): Payer: Self-pay | Admitting: Obstetrics and Gynecology

## 2020-11-24 ENCOUNTER — Inpatient Hospital Stay (HOSPITAL_COMMUNITY)
Admission: AD | Admit: 2020-11-24 | Discharge: 2020-11-30 | DRG: 818 | Disposition: A | Discharge status: Home / Self Care | Payer: Medicaid Other | Attending: Obstetrics and Gynecology | Admitting: Obstetrics and Gynecology

## 2020-11-24 ENCOUNTER — Other Ambulatory Visit: Payer: Self-pay

## 2020-11-24 DIAGNOSIS — O211 Hyperemesis gravidarum with metabolic disturbance: Secondary | ICD-10-CM | POA: Diagnosis present

## 2020-11-24 DIAGNOSIS — K828 Other specified diseases of gallbladder: Secondary | ICD-10-CM | POA: Diagnosis not present

## 2020-11-24 DIAGNOSIS — K8000 Calculus of gallbladder with acute cholecystitis without obstruction: Secondary | ICD-10-CM | POA: Diagnosis present

## 2020-11-24 DIAGNOSIS — O99612 Diseases of the digestive system complicating pregnancy, second trimester: Principal | ICD-10-CM | POA: Diagnosis present

## 2020-11-24 DIAGNOSIS — Z3A17 17 weeks gestation of pregnancy: Secondary | ICD-10-CM

## 2020-11-24 DIAGNOSIS — O26891 Other specified pregnancy related conditions, first trimester: Secondary | ICD-10-CM | POA: Diagnosis present

## 2020-11-24 DIAGNOSIS — R339 Retention of urine, unspecified: Secondary | ICD-10-CM | POA: Diagnosis present

## 2020-11-24 DIAGNOSIS — O26612 Liver and biliary tract disorders in pregnancy, second trimester: Secondary | ICD-10-CM | POA: Diagnosis present

## 2020-11-24 DIAGNOSIS — R1011 Right upper quadrant pain: Secondary | ICD-10-CM | POA: Diagnosis present

## 2020-11-24 DIAGNOSIS — Z20822 Contact with and (suspected) exposure to covid-19: Secondary | ICD-10-CM | POA: Diagnosis present

## 2020-11-24 DIAGNOSIS — K8020 Calculus of gallbladder without cholecystitis without obstruction: Secondary | ICD-10-CM | POA: Diagnosis not present

## 2020-11-24 DIAGNOSIS — Z3A15 15 weeks gestation of pregnancy: Secondary | ICD-10-CM

## 2020-11-24 DIAGNOSIS — R945 Abnormal results of liver function studies: Secondary | ICD-10-CM | POA: Diagnosis not present

## 2020-11-24 DIAGNOSIS — Z3A16 16 weeks gestation of pregnancy: Secondary | ICD-10-CM

## 2020-11-24 DIAGNOSIS — R109 Unspecified abdominal pain: Secondary | ICD-10-CM | POA: Diagnosis not present

## 2020-11-24 DIAGNOSIS — R7989 Other specified abnormal findings of blood chemistry: Secondary | ICD-10-CM

## 2020-11-24 DIAGNOSIS — O132 Gestational [pregnancy-induced] hypertension without significant proteinuria, second trimester: Secondary | ICD-10-CM | POA: Diagnosis present

## 2020-11-24 DIAGNOSIS — R1110 Vomiting, unspecified: Secondary | ICD-10-CM | POA: Diagnosis not present

## 2020-11-24 LAB — LIPASE, BLOOD: Lipase: 85 U/L — ABNORMAL HIGH (ref 11–51)

## 2020-11-24 LAB — URINALYSIS, ROUTINE W REFLEX MICROSCOPIC
Glucose, UA: NEGATIVE mg/dL
Hgb urine dipstick: NEGATIVE
Ketones, ur: 80 mg/dL — AB
Nitrite: NEGATIVE
Protein, ur: 100 mg/dL — AB
Specific Gravity, Urine: 1.024 (ref 1.005–1.030)
pH: 6 (ref 5.0–8.0)

## 2020-11-24 LAB — RESP PANEL BY RT-PCR (FLU A&B, COVID) ARPGX2
Influenza A by PCR: NEGATIVE
Influenza B by PCR: NEGATIVE
SARS Coronavirus 2 by RT PCR: NEGATIVE

## 2020-11-24 LAB — COMPREHENSIVE METABOLIC PANEL
ALT: 694 U/L — ABNORMAL HIGH (ref 0–44)
AST: 362 U/L — ABNORMAL HIGH (ref 15–41)
Albumin: 3.4 g/dL — ABNORMAL LOW (ref 3.5–5.0)
Alkaline Phosphatase: 87 U/L (ref 38–126)
Anion gap: 15 (ref 5–15)
BUN: 9 mg/dL (ref 6–20)
CO2: 18 mmol/L — ABNORMAL LOW (ref 22–32)
Calcium: 9.9 mg/dL (ref 8.9–10.3)
Chloride: 98 mmol/L (ref 98–111)
Creatinine, Ser: 0.78 mg/dL (ref 0.44–1.00)
GFR, Estimated: >60 mL/min (ref >60)
Glucose, Bld: 106 mg/dL — ABNORMAL HIGH (ref 70–99)
Potassium: 3 mmol/L — ABNORMAL LOW (ref 3.5–5.1)
Sodium: 131 mmol/L — ABNORMAL LOW (ref 135–145)
Total Bilirubin: 4.3 mg/dL — ABNORMAL HIGH (ref 0.3–1.2)
Total Protein: 7.5 g/dL (ref 6.5–8.1)

## 2020-11-24 LAB — CBC
HCT: 41.7 % (ref 36.0–46.0)
Hemoglobin: 14.2 g/dL (ref 12.0–15.0)
MCH: 26.2 pg (ref 26.0–34.0)
MCHC: 34.1 g/dL (ref 30.0–36.0)
MCV: 77.1 fL — ABNORMAL LOW (ref 80.0–100.0)
Platelets: 252 10*3/uL (ref 150–400)
RBC: 5.41 MIL/uL — ABNORMAL HIGH (ref 3.87–5.11)
RDW: 14.2 % (ref 11.5–15.5)
WBC: 12 10*3/uL — ABNORMAL HIGH (ref 4.0–10.5)
nRBC: 0 % (ref 0.0–0.2)

## 2020-11-24 LAB — TYPE AND SCREEN
ABO/RH(D): O POS
Antibody Screen: NEGATIVE

## 2020-11-24 MED ORDER — METOCLOPRAMIDE HCL 5 MG/ML IJ SOLN
10.0000 mg | Freq: Once | INTRAMUSCULAR | Status: AC
  Administered 2020-11-24: 10 mg via INTRAVENOUS
  Filled 2020-11-24: qty 2

## 2020-11-24 MED ORDER — CALCIUM CARBONATE ANTACID 500 MG PO CHEW
2.0000 | CHEWABLE_TABLET | ORAL | Status: DC | PRN
  Administered 2020-11-25: 400 mg via ORAL
  Filled 2020-11-24: qty 2

## 2020-11-24 MED ORDER — SODIUM CHLORIDE 0.9 % IV SOLN
25.0000 mg | Freq: Four times a day (QID) | INTRAVENOUS | Status: DC | PRN
  Administered 2020-11-24 – 2020-11-29 (×7): 25 mg via INTRAVENOUS
  Filled 2020-11-24 (×4): qty 1
  Filled 2020-11-24: qty 25
  Filled 2020-11-24 (×2): qty 1

## 2020-11-24 MED ORDER — ZOLPIDEM TARTRATE 5 MG PO TABS: 5.0000 mg | ORAL_TABLET | Freq: Every evening | ORAL | Status: DC | PRN

## 2020-11-24 MED ORDER — POTASSIUM CHLORIDE 10 MEQ/100ML IV SOLN
10.0000 meq | INTRAVENOUS | Status: AC
  Administered 2020-11-24 (×3): 10 meq via INTRAVENOUS
  Filled 2020-11-24 (×3): qty 100

## 2020-11-24 MED ORDER — ACETAMINOPHEN 325 MG PO TABS
650.0000 mg | ORAL_TABLET | ORAL | Status: DC | PRN
  Administered 2020-11-24: 650 mg via ORAL
  Filled 2020-11-24: qty 2

## 2020-11-24 MED ORDER — THIAMINE HCL 100 MG/ML IJ SOLN
100.0000 mg | Freq: Every day | INTRAMUSCULAR | Status: DC
  Administered 2020-11-24 – 2020-11-29 (×6): 100 mg via INTRAVENOUS
  Filled 2020-11-24 (×8): qty 1

## 2020-11-24 MED ORDER — PRENATAL MULTIVITAMIN CH
1.0000 | ORAL_TABLET | Freq: Every day | ORAL | Status: DC
  Administered 2020-11-28: 1 via ORAL
  Filled 2020-11-24 (×3): qty 1

## 2020-11-24 MED ORDER — SCOPOLAMINE 1 MG/3DAYS TD PT72
1.0000 | MEDICATED_PATCH | TRANSDERMAL | Status: DC
  Administered 2020-11-24 – 2020-11-27 (×2): 1.5 mg via TRANSDERMAL
  Filled 2020-11-24 (×2): qty 1

## 2020-11-24 MED ORDER — DOCUSATE SODIUM 100 MG PO CAPS
100.0000 mg | ORAL_CAPSULE | Freq: Every day | ORAL | Status: DC
  Administered 2020-11-24 – 2020-11-30 (×5): 100 mg via ORAL
  Filled 2020-11-24 (×5): qty 1

## 2020-11-24 MED ORDER — FAMOTIDINE IN NACL 20-0.9 MG/50ML-% IV SOLN
20.0000 mg | Freq: Once | INTRAVENOUS | Status: AC
  Administered 2020-11-24: 20 mg via INTRAVENOUS
  Filled 2020-11-24: qty 50

## 2020-11-24 MED ORDER — LACTATED RINGERS IV SOLN
INTRAVENOUS | Status: DC
  Administered 2020-11-30: 75 mL/h via INTRAVENOUS

## 2020-11-24 MED ORDER — LACTATED RINGERS IV BOLUS
1000.0000 mL | Freq: Once | INTRAVENOUS | Status: AC
  Administered 2020-11-24: 1000 mL via INTRAVENOUS

## 2020-11-24 NOTE — MAU Note (Signed)
C/O constant n/v. Took promethazine and zofran around 11 without reif . Not able to keep it down.

## 2020-11-24 NOTE — H&P (Addendum)
FACULTY PRACTICE ANTEPARTUM ADMISSION HISTORY AND PHYSICAL NOTE   History of Present Illness: Deborah Evans is a 25 y.o. G3P1011 at [redacted]w[redacted]d admitted for observation for symptomatic cholelithiasis and intractable vomiting in setting of hyperemesis    Patient presented to MAU for continued intractable nausea and vomiting and RUQ pain. Reports she took Phenergan last about 3 hours prior to presentation and she has since vomited once. She has not been able to keep down any food or fluids since her last MAU visit (which was on 7/22). She reports abdominal pain in the bilateral lower abdomen and denies vaginal bleeding.   Of note, during patient's last visit to MAU for similar symptoms she had uptrending LFTs (AST 44-->75; ALT 80--> 146), hypokalemia to 3.2 (which was repleted), and on RUQ [place] was found to have cholelithiasis. The team at the time spoke with the general surgeons and there are currently efforts to schedule patient for a cholecystectomy but it is not scheduled yet.   Denies any obstetric concerns at this time including no vaginal bleeding.  Despite medications given in MAU, patient continues to be nauseous without able to keep food or fluid down and having abdominal pain and now has worsening LFTs.    Patient Active Problem List   Diagnosis Date Noted   [redacted] weeks gestation of pregnancy 11/24/2020   Cholelithiasis affecting pregnancy in second trimester, antepartum 11/24/2020   Cholelithiasis complicating pregnancy in second trimester, antepartum 11/24/2020   Encounter for supervision of normal pregnancy, antepartum 10/30/2019   History of hyperemesis gravidarum 05/28/2018   Hypokalemia 11/28/2017    Past Medical History:  Diagnosis Date   Depression    when going through hyperemesis with first preg, ok now   Hyperemesis gravidarum     Past Surgical History:  Procedure Laterality Date   DILATION AND CURETTAGE OF UTERUS N/A 07/15/2014   Procedure: SUCTION DILATATION AND  CURETTAGE;  Surgeon: Lazaro Arms, MD;  Location: [facility];  Service: Gynecology;  Laterality: N/A;    OB History  Gravida Para Term Preterm AB Living  3 1 1   1 1   SAB IAB Ectopic Multiple Live Births  1     0 1    # Outcome Date GA Lbr Len/2nd Weight Sex Delivery Anes PTL Lv  3 Current           2 Term 04/25/18 [redacted]w[redacted]d 13:50 / 00:28 2980 g F Vag-Spont EPI  LIV  1 SAB 06/2014 [redacted]w[redacted]d           Social History   Socioeconomic History   Marital status: Single    Spouse name: Not on file   Number of children: Not on file   Years of education: Not on file   Highest education level: Not on file  Occupational History   Occupation: none  Tobacco Use   Smoking status: Never   Smokeless tobacco: Never  Vaping Use   Vaping Use: Never used  Substance and Sexual Activity   Alcohol use: No   Drug use: No   Sexual activity: Not Currently    Birth control/protection: None  Other Topics Concern   Not on file  Social History Narrative   Not on file   Social Determinants of Health   Financial Resource Strain: Not on file  Food Insecurity: Not on file  Transportation Needs: Not on file  Physical Activity: Not on file  Stress: Not on file  Social Connections: Not on file    Family History  Problem Relation Age of Onset   Healthy Mother    Aneurysm Father    Hypertension Maternal Grandmother    ADD / ADHD Neg Hx    Alcohol abuse Neg Hx    Anxiety disorder Neg Hx    Arthritis Neg Hx    Birth defects Neg Hx    Asthma Neg Hx    Cancer Neg Hx    COPD Neg Hx    Depression Neg Hx    Diabetes Neg Hx    Drug abuse Neg Hx    Early death Neg Hx    Hearing loss Neg Hx    Heart disease Neg Hx    Hyperlipidemia Neg Hx    Intellectual disability Neg Hx    Kidney disease Neg Hx    Learning disabilities Neg Hx    Miscarriages / Stillbirths Neg Hx    Obesity Neg Hx    Stroke Neg Hx    Vision loss Neg Hx    Varicose Veins Neg Hx     No Known Allergies  Medications Prior to  Admission  Medication Sig Dispense Refill Last Dose   ondansetron (ZOFRAN ODT) 8 MG disintegrating tablet Take 1 tablet (8 mg total) by mouth every 8 (eight) hours as needed for refractory nausea / vomiting. 30 tablet 0 11/24/2020 at 1100   promethazine (PHENERGAN) 25 MG tablet Take 1 tablet (25 mg total) by mouth every 6 (six) hours as needed for nausea or vomiting. 30 tablet 1 11/24/2020 at 1100   Doxylamine-Pyridoxine 10-10 MG TBEC Take 2 tabs at bedtime. If needed, add another tab in the morning. If needed, add another tab in the afternoon, up to 4 tabs/day. 120 tablet 0    famotidine (PEPCID) 10 MG tablet Take 1 tablet (10 mg total) by mouth 2 (two) times daily. 60 tablet 0    methylPREDNISolone (MEDROL) 8 MG tablet Day 1-5: 2 tabs TID, Day 6-7: 1 tab TID, Day 8-10 half tab TID, Day 11-12 1 tab q AM, Day 13-14: half tab q AM 41 tablet 0    potassium chloride (KLOR-CON) 20 MEQ packet Take 20 mEq by mouth daily for 2 days. 2 packet 0    Prenatal MV-Min-FA-Omega-3 (PRENATAL GUMMIES/DHA & FA PO) Take 2 tablets by mouth daily.       prochlorperazine (COMPAZINE) 10 MG tablet Take 1 tablet (10 mg total) by mouth in the morning and at bedtime. 60 tablet 1    promethazine (PHENERGAN) 25 MG suppository Place 1 suppository (25 mg total) rectally every 6 (six) hours as needed for nausea or vomiting. 12 each 0    scopolamine (TRANSDERM-SCOP) 1 MG/3DAYS Place 1 patch (1.5 mg total) onto the skin every 3 (three) days. 10 patch 12     Review of Systems  Constitutional:  Negative for chills and fever. Respiratory:  Negative for shortness of breath.   Cardiovascular:  Negative for chest pain. Gastrointestinal:  Positive for abdominal pain, nausea and vomiting. Negative for anal bleeding, blood in stool and diarrhea. Endocrine: Negative for polyuria. Genitourinary:  Negative for dysuria. Musculoskeletal:  Positive for back pain.       Low back pain  Skin:  Negative for rash. Neurological:  Negative for  light-headedness, numbness and headaches. Hematological:  Negative for adenopathy. Psychiatric/Behavioral:  Negative for agitation.    Vitals:  BP 133/80 (BP Location: Right Arm)   Pulse (!) 106   Temp 98.6 F (37 C) (Oral)   Resp 18   Ht  (  1.727 m)   Wt 58.4 kg   LMP 08/10/2020 (Exact Date)   SpO2 100%   BMI 19.58 kg/m  Physical Examination: WT trend: 62 59 kg (7/22) --> 58.42 kg (7/27)   Physical Exam Constitutional:      Comments: Appears uncomfortable   HENT:    Head: Normocephalic. Cardiovascular:    Rate and Rhythm: Regular rhythm. Tachycardia present.    Pulses: Normal pulses. Pulmonary:    Effort: Pulmonary effort is normal. Abdominal:    Tenderness: There is abdominal tenderness. There is no guarding or rebound.    Comments: TTP of RUQ, epigastric region, and also some lower abdominal tenderness  Skin:    Capillary Refill: Capillary refill takes less than 2 seconds. Neurological:    General: No focal deficit present.    Mental Status: She is alert.    Labs:  Results for orders placed or performed during the hospital encounter of 11/24/20 (from the past 24 hour(s))  Urinalysis, Routine w reflex microscopic Urine, Clean Catch   Collection Time: 11/24/20  2:20 PM  Result Value Ref Range   Color, Urine AMBER (A) YELLOW   APPearance HAZY (A) CLEAR   Specific Gravity, Urine 1.024 1.005 - 1.030   pH 6.0 5.0 - 8.0   Glucose, UA NEGATIVE NEGATIVE mg/dL   Hgb urine dipstick NEGATIVE NEGATIVE   Bilirubin Urine SMALL (A) NEGATIVE   Ketones, ur 80 (A) NEGATIVE mg/dL   Protein, ur 329 (A) NEGATIVE mg/dL   Nitrite NEGATIVE NEGATIVE   Leukocytes,Ua TRACE (A) NEGATIVE   RBC / HPF 0-5 0 - 5 RBC/hpf   WBC, UA 11-20 0 - 5 WBC/hpf   Bacteria, UA RARE (A) NONE SEEN   Squamous Epithelial / LPF 0-5 0 - 5   Mucus PRESENT    Hyaline Casts, UA PRESENT    Granular Casts, UA PRESENT   CBC   Collection Time: 11/24/20  2:20 PM  Result Value Ref Range   WBC 12.0 (H) 4.0  - 10.5 K/uL   RBC 5.41 (H) 3.87 - 5.11 MIL/uL   Hemoglobin 14.2 12.0 - 15.0 g/dL   HCT 92.4 26.8 - 34.1 %   MCV 77.1 (L) 80.0 - 100.0 fL   MCH 26.2 26.0 - 34.0 pg   MCHC 34.1 30.0 - 36.0 g/dL   RDW 96.2 22.9 - 79.8 %   Platelets 252 150 - 400 K/uL   nRBC 0.0 0.0 - 0.2 %  Comprehensive metabolic panel   Collection Time: 11/24/20  2:20 PM  Result Value Ref Range   Sodium 131 (L) 135 - 145 mmol/L   Potassium 3.0 (L) 3.5 - 5.1 mmol/L   Chloride 98 98 - 111 mmol/L   CO2 18 (L) 22 - 32 mmol/L   Glucose, Bld 106 (H) 70 - 99 mg/dL   BUN 9 6 - 20 mg/dL   Creatinine, Ser 9.21 0.44 - 1.00 mg/dL   Calcium 9.9 8.9 - 19.4 mg/dL   Total Protein 7.5 6.5 - 8.1 g/dL   Albumin 3.4 (L) 3.5 - 5.0 g/dL   AST 174 (H) 15 - 41 U/L   ALT 694 (H) 0 - 44 U/L   Alkaline Phosphatase 87 38 - 126 U/L   Total Bilirubin 4.3 (H) 0.3 - 1.2 mg/dL   GFR, Estimated >08 >14 mL/min   Anion gap 15 5 - 15  Lipase, blood   Collection Time: 11/24/20  2:20 PM  Result Value Ref Range   Lipase 85 (H) 11 - 51  U/L    Imaging Studies: US OB Comp Less 14 Wks  Result Date: 11/09/2020 Formatting of this result is different from the original. Images from the original result were not included.  [facility] [address] [place] [id number]                                                                                              DATING AND VIABILITY SONOGRAM Deborah Evans is a 25 y.o. year old 423P1011 with LMP Patient's last menstrual period was 08/10/2020 (exact date). which would correlate to  3393w0d weeks gestation.  She has regular menstrual cycles.   She is here today for a confirmatory initial sonogram. GESTATION: SINGLETON   FETAL ACTIVITY:          Heart rate         169          The fetus is active. PLACENTA LOCALIZATION:  anterior GRADE 0 CERVIX: Appears closed ADNEXA: The ovaries are normal. GESTATIONAL AGE AND  BIOMETRICS: Gestational criteria: Estimated Date of Delivery: 05/17/21  by LMP now at 6393w0d Previous Scans:0    CROWN RUMP LENGTH           58.89 mm         12+2 weeks                                                                      AVERAGE EGA(BY THIS SCAN):  12+2 weeks WORKING EDD( LMP ):  05/17/2021  TECHNICIAN COMMENTS: US 13 wks,CRL 58.89 mm,normal ovaries,anterior placenta gr 0,fhr 169 bpm,unable to obtain NT because of fetal position,NB present A copy of this report including all images has been saved and backed up to a second source for retrieval if needed. All measures and details of the anatomical scan, placentation, fluid volume and pelvic anatomy are contained in that report. Amber Flora LippsJ Carl 11/09/2020 10:19 AM Clinical Impression and recommendations: I have reviewed the sonogram results above, combined with the patient's current clinical course, below are my impressions and any appropriate recommendations for management based on the sonographic findings. Viable  IUP, early 2nd trimester G3P1011 Estimated Date of Delivery: 05/17/21 LMP, today's sonogram Normal general sonographic findings Recommend routine care unless otherwise clinically indicated Lazaro ArmsLuther H Eure 11/09/2020 10:46 AM  US ABDOMEN LIMITED RUQ (LIVER/GB)  Result Date: 11/19/2020 CLINICAL DATA:  Elevated LFTs, nausea, vomiting 14 weeks, 3 days pregnant, hyperemesis gravidarum EXAM: ULTRASOUND ABDOMEN LIMITED RIGHT UPPER QUADRANT COMPARISON:  09/17/2019 FINDINGS: Gallbladder: Tiny echogenic stones admixed with biliary sludge seen within the gallbladder lumen. No significant gallbladder wall thickening. Generalized abdominal pain with noted by sonographer but without a focal sonographic Murphy sign elicited on exam. Common bile duct: Diameter: 2.4 mm Liver: No focal lesion identified. Within normal limits in parenchymal echogenicity. No intrahepatic ductal dilation. Portal vein  is patent on color Doppler imaging with normal direction of blood flow towards the liver. Other: Technically challenging exam due to  patient discomfort. Unable to perform breath hold. Patient with emesis during exam. IMPRESSION: Technically challenging exam due to patient discomfort and inability to perform breath hold as well as patient nausea and emesis. Cholelithiasis and biliary sludge without convincing sonographic evidence of acute cholecystitis. Otherwise unremarkable right upper quadrant ultrasound. Electronically Signed   By: Kreg Shropshire M.D.   On: 11/19/2020 23:26     Assessment and Plan: Cholelithiasis with elevated LFTs and T Bili Patient with hx of abdominal pain worse in RUQ, nausea/vomiting,  [place] finding (on 7/22) of cholelithiasis and biliary sludge without signs of acute cholecystitis. At that time plan was made for outpatient arrangement of cholecystectomy. Patient now returns with worsening abdominal discomfort, continued intractable nausea and vomiting and worsening labs. Discussed with attending on call Dr. Donavan Foil as well as Dr. Despina Hidden (who was familiar with patient from prior call shift). - MRCP order placed - Gen surg consulted for evaluation of patient for surgical management of Cholelithiasis, follow up recommendations - NPO - Continue IVFluids   2. Hyperemesis Gravidarum Hx of HG with prior and current pregnancy, intractable vomiting during current pregnancy which is only mildly responsive to PRN Zofran and Phenergan and also now with 4 kg weight loss and continued hypokalemia. Patient has been prescribed PO Methylprednisolone but has not been taking it.  Will plan to treat for symptoms of nausea, give IV fluid bolus given her tachycardia, and repeat labs to assess for status of liver and gallbladder function.  - Will plan to admit patient for observation, fluids, and electrolyte repletion as well as further evaluation and planning for symptomatic cholelithiasis. - Continue IV fluids - IV Promethazine q6hrs PRN for nausea and vomiting - Continue Scopolamine patch - IV Thiamine - Ambien PRN for sleep  3.  Hypokalemia K 3.0 in setting of intractable vomiting - IV potassium for repletion   [redacted] weeks gestation of pregnancy Doppler heart tones appreciated during triage eval.  - continue prenatal vitamin - Has follow up prenatal appt on 12/02/2020 at [facility]  Admit to [facility] Specialty service  Warner Mccreedy, MD, MPH OB Fellow, Faculty Practice

## 2020-11-24 NOTE — Consult Note (Signed)
Deborah Evans October 27, 1995  [id number].    Requesting MD: Dr. Donavan Foil Chief Complaint/Reason for Consult: RUQ abdominal pain, gallstones  HPI: Deborah Evans is a 25 y.o. female who is G3P1011 [redacted]w[redacted]d who represnted to the MAU today for abdominal pain. Initially presented on 7/22 for n/v. No reported abdominal pain per MAU note and "was hunched over during initial assessment and was not able to be examined". She underwent an RUQ [place] that showed gallstones without cholecystitis and a normal CBD diameter. LFT's were slightly elevated, lipase 52 and WBC wnl. She was discharged home with f/u with general surgeon at [facility]. Since discharge she has continued to have n/v along with upper abdominal pain. N/v unrelieved by pherngan. She has been unable to keep food/liquids down. Labs today were significant for WBC 12, rising LFT's w/ T. Bili of 4.3, and Lipase of 85. No further imaging done at this time. We were asked to see.   Patient is not on any blood thinners. Hx of D&C. No other prior abdominal surgery. She denies tobacco, alcohol or illicit drug use. She is employed at [employer].   ROS: Review of Systems  Constitutional:  Negative for chills and fever.  Respiratory:  Negative for cough and shortness of breath.   Cardiovascular:  Negative for chest pain.  Gastrointestinal:  Positive for abdominal pain, nausea and vomiting.  Psychiatric/Behavioral:  Negative for substance abuse.   All other systems reviewed and are negative.  Family History  Problem Relation Age of Onset   Healthy Mother    Aneurysm Father    Hypertension Maternal Grandmother    ADD / ADHD Neg Hx    Alcohol abuse Neg Hx    Anxiety disorder Neg Hx    Arthritis Neg Hx    Birth defects Neg Hx    Asthma Neg Hx    Cancer Neg Hx    COPD Neg Hx    Depression Neg Hx    Diabetes Neg Hx    Drug abuse Neg Hx    Early death Neg Hx    Hearing loss Neg Hx    Heart disease Neg Hx    Hyperlipidemia Neg Hx    Intellectual disability  Neg Hx    Kidney disease Neg Hx    Learning disabilities Neg Hx    Miscarriages / Stillbirths Neg Hx    Obesity Neg Hx    Stroke Neg Hx    Vision loss Neg Hx    Varicose Veins Neg Hx     Past Medical History:  Diagnosis Date   Depression    when going through hyperemesis with first preg, ok now   Hyperemesis gravidarum     Past Surgical History:  Procedure Laterality Date   DILATION AND CURETTAGE OF UTERUS N/A 07/15/2014   Procedure: SUCTION DILATATION AND CURETTAGE;  Surgeon: Lazaro Arms, MD;  Location: [facility];  Service: Gynecology;  Laterality: N/A;    Social History:  reports that she has never smoked. She has never used smokeless tobacco. She reports that she does not drink alcohol and does not use drugs.  Allergies: No Known Allergies  Medications Prior to Admission  Medication Sig Dispense Refill   ondansetron (ZOFRAN ODT) 8 MG disintegrating tablet Take 1 tablet (8 mg total) by mouth every 8 (eight) hours as needed for refractory nausea / vomiting. 30 tablet 0   promethazine (PHENERGAN) 25 MG tablet Take 1 tablet (25 mg total) by mouth every 6 (six) hours as  needed for nausea or vomiting. 30 tablet 1   Doxylamine-Pyridoxine 10-10 MG TBEC Take 2 tabs at bedtime. If needed, add another tab in the morning. If needed, add another tab in the afternoon, up to 4 tabs/day. 120 tablet 0   famotidine (PEPCID) 10 MG tablet Take 1 tablet (10 mg total) by mouth 2 (two) times daily. 60 tablet 0   methylPREDNISolone (MEDROL) 8 MG tablet Day 1-5: 2 tabs TID, Day 6-7: 1 tab TID, Day 8-10 half tab TID, Day 11-12 1 tab q AM, Day 13-14: half tab q AM 41 tablet 0   potassium chloride (KLOR-CON) 20 MEQ packet Take 20 mEq by mouth daily for 2 days. 2 packet 0   Prenatal MV-Min-FA-Omega-3 (PRENATAL GUMMIES/DHA & FA PO) Take 2 tablets by mouth daily.      prochlorperazine (COMPAZINE) 10 MG tablet Take 1 tablet (10 mg total) by mouth in the morning and at bedtime. 60 tablet 1   promethazine  (PHENERGAN) 25 MG suppository Place 1 suppository (25 mg total) rectally every 6 (six) hours as needed for nausea or vomiting. 12 each 0   scopolamine (TRANSDERM-SCOP) 1 MG/3DAYS Place 1 patch (1.5 mg total) onto the skin every 3 (three) days. 10 patch 12     Physical Exam: Blood pressure 138/87, pulse (!) 101, temperature 98.2 F (36.8 C), resp. rate 18, height 5\' 8"  (1.727 m), weight 58.4 kg, last menstrual period 08/10/2020, SpO2 99 %, unknown if currently breastfeeding. General: pleasant, WD/WN AA female who is laying in bed in NAD HEENT: head is normocephalic, atraumatic.  Sclera are anicteric.  PERRL.  Ears and nose without any masses or lesions.  Mouth is pink and moist. Dentition fair Heart: regular, rate, and rhythm.  Normal s1,s2. No obvious murmurs, gallops, or rubs noted.  Palpable pedal pulses bilaterally  Lungs: CTAB, no wheezes, rhonchi, or rales noted.  Respiratory effort nonlabored Abd:  Soft, gravid abdomen that is ND and tender over the RUQ and epigastrium, +BS, no masses, hernias, or organomegaly MS: no BUE/BLE edema, calves soft and nontender Skin: warm and dry with no masses, lesions, or rashes Psych: A&Ox4 with an appropriate affect Neuro: cranial nerves grossly intact, equal strength in BUE/BLE bilaterally, normal speech, thought process intact, moves all extremities, gait not assessed   Results for orders placed or performed during the hospital encounter of 11/24/20 (from the past 48 hour(s))  Urinalysis, Routine w reflex microscopic Urine, Clean Catch     Status: Abnormal   Collection Time: 11/24/20  2:20 PM  Result Value Ref Range   Color, Urine AMBER (A) YELLOW    Comment: BIOCHEMICALS MAY BE AFFECTED BY COLOR   APPearance HAZY (A) CLEAR   Specific Gravity, Urine 1.024 1.005 - 1.030   pH 6.0 5.0 - 8.0   Glucose, UA NEGATIVE NEGATIVE mg/dL   Hgb urine dipstick NEGATIVE NEGATIVE   Bilirubin Urine SMALL (A) NEGATIVE   Ketones, ur 80 (A) NEGATIVE mg/dL    Protein, ur 11/26/20 (A) NEGATIVE mg/dL   Nitrite NEGATIVE NEGATIVE   Leukocytes,Ua TRACE (A) NEGATIVE   RBC / HPF 0-5 0 - 5 RBC/hpf   WBC, UA 11-20 0 - 5 WBC/hpf   Bacteria, UA RARE (A) NONE SEEN   Squamous Epithelial / LPF 0-5 0 - 5   Mucus PRESENT    Hyaline Casts, UA PRESENT    Granular Casts, UA PRESENT     Comment: Performed at [facility], [address] [place], [place]  CBC     Status: Abnormal   Collection Time: 11/24/20  2:20 PM  Result Value Ref Range   WBC 12.0 (H) 4.0 - 10.5 K/uL   RBC 5.41 (H) 3.87 - 5.11 MIL/uL   Hemoglobin 14.2 12.0 - 15.0 g/dL   HCT 46.5 68.1 - 27.5 %   MCV 77.1 (L) 80.0 - 100.0 fL   MCH 26.2 26.0 - 34.0 pg   MCHC 34.1 30.0 - 36.0 g/dL   RDW 17.0 01.7 - 49.4 %   Platelets 252 150 - 400 K/uL   nRBC 0.0 0.0 - 0.2 %    Comment: Performed at [facility], [address] [place], [place] [address]  Comprehensive metabolic panel     Status: Abnormal   Collection Time: 11/24/20  2:20 PM  Result Value Ref Range   Sodium 131 (L) 135 - 145 mmol/L   Potassium 3.0 (L) 3.5 - 5.1 mmol/L   Chloride 98 98 - 111 mmol/L   CO2 18 (L) 22 - 32 mmol/L   Glucose, Bld 106 (H) 70 - 99 mg/dL    Comment: Glucose reference range applies only to samples taken after fasting for at least 8 hours.   BUN 9 6 - 20 mg/dL   Creatinine, Ser 9.16 0.44 - 1.00 mg/dL   Calcium 9.9 8.9 - 38.4 mg/dL   Total Protein 7.5 6.5 - 8.1 g/dL   Albumin 3.4 (L) 3.5 - 5.0 g/dL   AST 665 (H) 15 - 41 U/L   ALT 694 (H) 0 - 44 U/L   Alkaline Phosphatase 87 38 - 126 U/L   Total Bilirubin 4.3 (H) 0.3 - 1.2 mg/dL   GFR, Estimated >99 >35 mL/min    Comment: (NOTE) Calculated using the CKD-EPI Creatinine Equation (2021)    Anion gap 15 5 - 15    Comment: Performed at [facility], [address] [place], [place] [address]  Lipase, blood     Status: Abnormal   Collection Time: 11/24/20  2:20 PM  Result Value Ref Range   Lipase 85 (H) 11 - 51 U/L    Comment:  Performed at [facility], [address], [place], [place] [address]   No results found.  Anti-infectives (From admission, onward)    None       Assessment/Plan Cholelithiasis with elevated LFT's - RUQ [place] 5 days ago w/ gallstones w/o gallbladder wall thickening or mention of pericholecystitic fluid. WBC 12.0, w/ elevated LFT's (AST 362, ALT 694, T. Bili 4.3.) Lipase elevated but does not meet criteria for pancreatitis. Rising LFT's from 5 days ago w/ T. Bili now > 4 concerning for possible choledocholithiasis. She does have an elevated WBC which is new from prior labs on 7/22 and could be developing acute cholecystitis. Reasonable to cover with IV abx while awaiting MRCP. If MRCP shows choledocholithiasis, would recommend GI consult. Keep NPO. Continue to trend labs. We will follow with you.   FEN - recommend NPO after MN  VTE - SCDs ID - No current abx  Trixie Deis, [facility] 11/24/2020, 4:08 PM Please see Amion for pager number during day hours 7:00am-4:30pm

## 2020-11-24 NOTE — MAU Provider Note (Signed)
History     CSN: [id number]  Arrival date and time: 11/24/20 1326   Event Date/Time   First Provider Initiated Contact with Patient 11/24/20 1357      Chief Complaint  Patient presents with   Emesis   Emesis  Associated symptoms include abdominal pain. Pertinent negatives include no chest pain, chills, diarrhea, fever or headaches.  25 yo F G3P1 at [redacted]w[redacted]d who re-presents to MAU for intractable nausea, vomiting, and abdominal pain and not able to keep food down for the past few days. Reports she took Phenergan last about 3 hours ago and she has since vomited once. She has not been able to keep down any food or fluids since her last MAU visit (which was on 7/22). She reports abdominal pain in the bilateral lower abdomen and denies vaginal bleeding.  Of note, during patient's last visit to MAU for similar symptoms she had uptrending LFTs (AST 44-->75; ALT 80--> 146), hypokalemia to 3.2 (which was repleted), and on RUQ [place] was found to have cholelithiasis. The team at the time spoke with the general surgeons and there are currently efforts to schedule patient for a cholecystectomy but it is not scheduled yet.   OB History     Gravida  3   Para  1   Term  1   Preterm      AB  1   Living  1      SAB  1   IAB      Ectopic      Multiple  0   Live Births  1           Past Medical History:  Diagnosis Date   Depression    when going through hyperemesis with first preg, ok now   Hyperemesis gravidarum     Past Surgical History:  Procedure Laterality Date   DILATION AND CURETTAGE OF UTERUS N/A 07/15/2014   Procedure: SUCTION DILATATION AND CURETTAGE;  Surgeon: Lazaro Arms, MD;  Location: [facility];  Service: Gynecology;  Laterality: N/A;    Family History  Problem Relation Age of Onset   Healthy Mother    Aneurysm Father    Hypertension Maternal Grandmother    ADD / ADHD Neg Hx    Alcohol abuse Neg Hx    Anxiety disorder Neg Hx    Arthritis Neg Hx    Birth  defects Neg Hx    Asthma Neg Hx    Cancer Neg Hx    COPD Neg Hx    Depression Neg Hx    Diabetes Neg Hx    Drug abuse Neg Hx    Early death Neg Hx    Hearing loss Neg Hx    Heart disease Neg Hx    Hyperlipidemia Neg Hx    Intellectual disability Neg Hx    Kidney disease Neg Hx    Learning disabilities Neg Hx    Miscarriages / Stillbirths Neg Hx    Obesity Neg Hx    Stroke Neg Hx    Vision loss Neg Hx    Varicose Veins Neg Hx     Social History   Tobacco Use   Smoking status: Never   Smokeless tobacco: Never  Vaping Use   Vaping Use: Never used  Substance Use Topics   Alcohol use: No   Drug use: No    Allergies: No Known Allergies  Medications Prior to Admission  Medication Sig Dispense Refill Last Dose   ondansetron (ZOFRAN ODT) 8 MG  disintegrating tablet Take 1 tablet (8 mg total) by mouth every 8 (eight) hours as needed for refractory nausea / vomiting. 30 tablet 0 11/24/2020 at 1100   promethazine (PHENERGAN) 25 MG tablet Take 1 tablet (25 mg total) by mouth every 6 (six) hours as needed for nausea or vomiting. 30 tablet 1 11/24/2020 at 1100   Doxylamine-Pyridoxine 10-10 MG TBEC Take 2 tabs at bedtime. If needed, add another tab in the morning. If needed, add another tab in the afternoon, up to 4 tabs/day. 120 tablet 0    famotidine (PEPCID) 10 MG tablet Take 1 tablet (10 mg total) by mouth 2 (two) times daily. 60 tablet 0    methylPREDNISolone (MEDROL) 8 MG tablet Day 1-5: 2 tabs TID, Day 6-7: 1 tab TID, Day 8-10 half tab TID, Day 11-12 1 tab q AM, Day 13-14: half tab q AM 41 tablet 0    potassium chloride (KLOR-CON) 20 MEQ packet Take 20 mEq by mouth daily for 2 days. 2 packet 0    Prenatal MV-Min-FA-Omega-3 (PRENATAL GUMMIES/DHA & FA PO) Take 2 tablets by mouth daily.       prochlorperazine (COMPAZINE) 10 MG tablet Take 1 tablet (10 mg total) by mouth in the morning and at bedtime. 60 tablet 1    promethazine (PHENERGAN) 25 MG suppository Place 1 suppository (25 mg  total) rectally every 6 (six) hours as needed for nausea or vomiting. 12 each 0    scopolamine (TRANSDERM-SCOP) 1 MG/3DAYS Place 1 patch (1.5 mg total) onto the skin every 3 (three) days. 10 patch 12     Review of Systems  Constitutional:  Negative for chills and fever.  Respiratory:  Negative for shortness of breath.   Cardiovascular:  Negative for chest pain.  Gastrointestinal:  Positive for abdominal pain, nausea and vomiting. Negative for anal bleeding, blood in stool and diarrhea.  Endocrine: Negative for polyuria.  Genitourinary:  Negative for dysuria.  Musculoskeletal:  Positive for back pain.       Low back pain  Skin:  Negative for rash.  Neurological:  Negative for light-headedness, numbness and headaches.  Hematological:  Negative for adenopathy.  Psychiatric/Behavioral:  Negative for agitation.   Physical Exam   Blood pressure 138/87, pulse (!) 101, temperature 98.2 F (36.8 C), resp. rate 18, height 5\' 8"  (1.727 m), weight 58.4 kg, last menstrual period 08/10/2020, SpO2 99 %, unknown if currently breastfeeding.  WT trend: 62 59 kg (7/22) --> 58.42 kg (7/27)  Physical Exam Constitutional:      Comments: Appears uncomfortable   HENT:     Head: Normocephalic.  Cardiovascular:     Rate and Rhythm: Regular rhythm. Tachycardia present.     Pulses: Normal pulses.  Pulmonary:     Effort: Pulmonary effort is normal.  Abdominal:     Tenderness: There is abdominal tenderness. There is no guarding or rebound.     Comments: TTP of RUQ, epigastric region, and also some lower abdominal tenderness  Skin:    Capillary Refill: Capillary refill takes less than 2 seconds.  Neurological:     General: No focal deficit present.     Mental Status: She is alert.    MAU Course  MDM #Nausea, vomiting, abdominal pain Patient presents to MAU for intractable nausea and vomiting in setting of hyperemesis gravidarum as well as cholelithiasis. Currently at home taking Phenergan and Zofran  without relief.  Will plan to treat for symptoms of nausea, give IV fluid bolus given her tachycardia, and repeat  labs to assess for status of liver and gallbladder function.  - IV Phenergan - IV Famotidine - LR Bolus - CBC, CMP, lipase  4:02 PM Patient continues to be nauseous and having dry heaving. Will trial IV reglan and Scopolamine patch. Labs back and significant for marked elevation in LFTs (AST 75--> 362; ALT 146 --> 694, T bili 1.3 --> 4.3)Discussed with attending on call Dr. Donavan Foil as well as Dr. Despina Hidden (who was familiar with patient from prior call shift) and will plan to get MRCP and consult gen surg for evaluation of patient for surgical management of Cholelithiasis.   Assessment and Plan   Hyperemesis Gravidarum Hx of HG with prior and current pregnancy, intractable vomiting during current pregnancy which is only mildly responsive to PRN Zofran and Phenergan and also now with 4 kg weight loss and continued hypokalemia. Patient has been prescribed PO Methylprednisolone but has not been taking it.  - IV Phenergan - IV Famotidine - LR Bolus and then IV fluids started - IV reglan given when phenergan not helping and Scopolamine patch ordered - Will plan to admit patient for observation, fluids, and electrolyte repletion as well as further evaluation and planning for symptomatic cholelithiasis.   Cholelithiasis Patient with hx of abdominal pain worse in RUQ, nausea/vomiting,  [place] finding (on 7/22) of cholelithiasis and biliary sludge without signs of acute cholecystitis. At that time plan was made for outpatient arrangement of cholecystectomy. Patient now returns with worsening abdominal discomfort, continued intractable nausea and vomiting and worsening labs. Discussed with attending on call Dr. Donavan Foil as well as Dr. Despina Hidden (who was familiar with patient from prior call shift) and will plan to get MRCP and consult gen surg for evaluation of patient for surgical management of Cholelithiasis.    [redacted] weeks gestation of pregnancy Doppler heart tones appreciated during triage eval.  - continue prenatal vitamin - Has follow up prenatal appt on 12/02/2020 at [facility]  See H+P for further plan  Warner Mccreedy, MD, MPH OB Fellow, Faculty Practice

## 2020-11-24 NOTE — H&P (View-Only) (Signed)
Deborah Evans October 27, 1995  [id number].    Requesting MD: Dr. Donavan Foil Chief Complaint/Reason for Consult: RUQ abdominal pain, gallstones  HPI: Deborah Evans is a 25 y.o. female who is G3P1011 [redacted]w[redacted]d who represnted to the MAU today for abdominal pain. Initially presented on 7/22 for n/v. No reported abdominal pain per MAU note and "was hunched over during initial assessment and was not able to be examined". She underwent an RUQ [place] that showed gallstones without cholecystitis and a normal CBD diameter. LFT's were slightly elevated, lipase 52 and WBC wnl. She was discharged home with f/u with general surgeon at [facility]. Since discharge she has continued to have n/v along with upper abdominal pain. N/v unrelieved by pherngan. She has been unable to keep food/liquids down. Labs today were significant for WBC 12, rising LFT's w/ T. Bili of 4.3, and Lipase of 85. No further imaging done at this time. We were asked to see.   Patient is not on any blood thinners. Hx of D&C. No other prior abdominal surgery. She denies tobacco, alcohol or illicit drug use. She is employed at [employer].   ROS: Review of Systems  Constitutional:  Negative for chills and fever.  Respiratory:  Negative for cough and shortness of breath.   Cardiovascular:  Negative for chest pain.  Gastrointestinal:  Positive for abdominal pain, nausea and vomiting.  Psychiatric/Behavioral:  Negative for substance abuse.   All other systems reviewed and are negative.  Family History  Problem Relation Age of Onset   Healthy Mother    Aneurysm Father    Hypertension Maternal Grandmother    ADD / ADHD Neg Hx    Alcohol abuse Neg Hx    Anxiety disorder Neg Hx    Arthritis Neg Hx    Birth defects Neg Hx    Asthma Neg Hx    Cancer Neg Hx    COPD Neg Hx    Depression Neg Hx    Diabetes Neg Hx    Drug abuse Neg Hx    Early death Neg Hx    Hearing loss Neg Hx    Heart disease Neg Hx    Hyperlipidemia Neg Hx    Intellectual disability  Neg Hx    Kidney disease Neg Hx    Learning disabilities Neg Hx    Miscarriages / Stillbirths Neg Hx    Obesity Neg Hx    Stroke Neg Hx    Vision loss Neg Hx    Varicose Veins Neg Hx     Past Medical History:  Diagnosis Date   Depression    when going through hyperemesis with first preg, ok now   Hyperemesis gravidarum     Past Surgical History:  Procedure Laterality Date   DILATION AND CURETTAGE OF UTERUS N/A 07/15/2014   Procedure: SUCTION DILATATION AND CURETTAGE;  Surgeon: Lazaro Arms, MD;  Location: [facility];  Service: Gynecology;  Laterality: N/A;    Social History:  reports that she has never smoked. She has never used smokeless tobacco. She reports that she does not drink alcohol and does not use drugs.  Allergies: No Known Allergies  Medications Prior to Admission  Medication Sig Dispense Refill   ondansetron (ZOFRAN ODT) 8 MG disintegrating tablet Take 1 tablet (8 mg total) by mouth every 8 (eight) hours as needed for refractory nausea / vomiting. 30 tablet 0   promethazine (PHENERGAN) 25 MG tablet Take 1 tablet (25 mg total) by mouth every 6 (six) hours as  needed for nausea or vomiting. 30 tablet 1   Doxylamine-Pyridoxine 10-10 MG TBEC Take 2 tabs at bedtime. If needed, add another tab in the morning. If needed, add another tab in the afternoon, up to 4 tabs/day. 120 tablet 0   famotidine (PEPCID) 10 MG tablet Take 1 tablet (10 mg total) by mouth 2 (two) times daily. 60 tablet 0   methylPREDNISolone (MEDROL) 8 MG tablet Day 1-5: 2 tabs TID, Day 6-7: 1 tab TID, Day 8-10 half tab TID, Day 11-12 1 tab q AM, Day 13-14: half tab q AM 41 tablet 0   potassium chloride (KLOR-CON) 20 MEQ packet Take 20 mEq by mouth daily for 2 days. 2 packet 0   Prenatal MV-Min-FA-Omega-3 (PRENATAL GUMMIES/DHA & FA PO) Take 2 tablets by mouth daily.      prochlorperazine (COMPAZINE) 10 MG tablet Take 1 tablet (10 mg total) by mouth in the morning and at bedtime. 60 tablet 1   promethazine  (PHENERGAN) 25 MG suppository Place 1 suppository (25 mg total) rectally every 6 (six) hours as needed for nausea or vomiting. 12 each 0   scopolamine (TRANSDERM-SCOP) 1 MG/3DAYS Place 1 patch (1.5 mg total) onto the skin every 3 (three) days. 10 patch 12     Physical Exam: Blood pressure 138/87, pulse (!) 101, temperature 98.2 F (36.8 C), resp. rate 18, height 5\' 8"  (1.727 m), weight 58.4 kg, last menstrual period 08/10/2020, SpO2 99 %, unknown if currently breastfeeding. General: pleasant, WD/WN AA female who is laying in bed in NAD HEENT: head is normocephalic, atraumatic.  Sclera are anicteric.  PERRL.  Ears and nose without any masses or lesions.  Mouth is pink and moist. Dentition fair Heart: regular, rate, and rhythm.  Normal s1,s2. No obvious murmurs, gallops, or rubs noted.  Palpable pedal pulses bilaterally  Lungs: CTAB, no wheezes, rhonchi, or rales noted.  Respiratory effort nonlabored Abd:  Soft, gravid abdomen that is ND and tender over the RUQ and epigastrium, +BS, no masses, hernias, or organomegaly MS: no BUE/BLE edema, calves soft and nontender Skin: warm and dry with no masses, lesions, or rashes Psych: A&Ox4 with an appropriate affect Neuro: cranial nerves grossly intact, equal strength in BUE/BLE bilaterally, normal speech, thought process intact, moves all extremities, gait not assessed   Results for orders placed or performed during the hospital encounter of 11/24/20 (from the past 48 hour(s))  Urinalysis, Routine w reflex microscopic Urine, Clean Catch     Status: Abnormal   Collection Time: 11/24/20  2:20 PM  Result Value Ref Range   Color, Urine AMBER (A) YELLOW    Comment: BIOCHEMICALS MAY BE AFFECTED BY COLOR   APPearance HAZY (A) CLEAR   Specific Gravity, Urine 1.024 1.005 - 1.030   pH 6.0 5.0 - 8.0   Glucose, UA NEGATIVE NEGATIVE mg/dL   Hgb urine dipstick NEGATIVE NEGATIVE   Bilirubin Urine SMALL (A) NEGATIVE   Ketones, ur 80 (A) NEGATIVE mg/dL    Protein, ur 11/26/20 (A) NEGATIVE mg/dL   Nitrite NEGATIVE NEGATIVE   Leukocytes,Ua TRACE (A) NEGATIVE   RBC / HPF 0-5 0 - 5 RBC/hpf   WBC, UA 11-20 0 - 5 WBC/hpf   Bacteria, UA RARE (A) NONE SEEN   Squamous Epithelial / LPF 0-5 0 - 5   Mucus PRESENT    Hyaline Casts, UA PRESENT    Granular Casts, UA PRESENT     Comment: Performed at [facility], [address] [place], [place]  CBC     Status: Abnormal   Collection Time: 11/24/20  2:20 PM  Result Value Ref Range   WBC 12.0 (H) 4.0 - 10.5 K/uL   RBC 5.41 (H) 3.87 - 5.11 MIL/uL   Hemoglobin 14.2 12.0 - 15.0 g/dL   HCT 46.5 68.1 - 27.5 %   MCV 77.1 (L) 80.0 - 100.0 fL   MCH 26.2 26.0 - 34.0 pg   MCHC 34.1 30.0 - 36.0 g/dL   RDW 17.0 01.7 - 49.4 %   Platelets 252 150 - 400 K/uL   nRBC 0.0 0.0 - 0.2 %    Comment: Performed at [facility], [address] [place], [place] [address]  Comprehensive metabolic panel     Status: Abnormal   Collection Time: 11/24/20  2:20 PM  Result Value Ref Range   Sodium 131 (L) 135 - 145 mmol/L   Potassium 3.0 (L) 3.5 - 5.1 mmol/L   Chloride 98 98 - 111 mmol/L   CO2 18 (L) 22 - 32 mmol/L   Glucose, Bld 106 (H) 70 - 99 mg/dL    Comment: Glucose reference range applies only to samples taken after fasting for at least 8 hours.   BUN 9 6 - 20 mg/dL   Creatinine, Ser 9.16 0.44 - 1.00 mg/dL   Calcium 9.9 8.9 - 38.4 mg/dL   Total Protein 7.5 6.5 - 8.1 g/dL   Albumin 3.4 (L) 3.5 - 5.0 g/dL   AST 665 (H) 15 - 41 U/L   ALT 694 (H) 0 - 44 U/L   Alkaline Phosphatase 87 38 - 126 U/L   Total Bilirubin 4.3 (H) 0.3 - 1.2 mg/dL   GFR, Estimated >99 >35 mL/min    Comment: (NOTE) Calculated using the CKD-EPI Creatinine Equation (2021)    Anion gap 15 5 - 15    Comment: Performed at [facility], [address] [place], [place] [address]  Lipase, blood     Status: Abnormal   Collection Time: 11/24/20  2:20 PM  Result Value Ref Range   Lipase 85 (H) 11 - 51 U/L    Comment:  Performed at [facility], [address], [place], [place] [address]   No results found.  Anti-infectives (From admission, onward)    None       Assessment/Plan Cholelithiasis with elevated LFT's - RUQ [place] 5 days ago w/ gallstones w/o gallbladder wall thickening or mention of pericholecystitic fluid. WBC 12.0, w/ elevated LFT's (AST 362, ALT 694, T. Bili 4.3.) Lipase elevated but does not meet criteria for pancreatitis. Rising LFT's from 5 days ago w/ T. Bili now > 4 concerning for possible choledocholithiasis. She does have an elevated WBC which is new from prior labs on 7/22 and could be developing acute cholecystitis. Reasonable to cover with IV abx while awaiting MRCP. If MRCP shows choledocholithiasis, would recommend GI consult. Keep NPO. Continue to trend labs. We will follow with you.   FEN - recommend NPO after MN  VTE - SCDs ID - No current abx  Trixie Deis, [facility] Surgery 11/24/2020, 4:08 PM Please see Amion for pager number during day hours 7:00am-4:30pm

## 2020-11-24 NOTE — MAU Note (Signed)
Patient states she is unable to leave a urine sample at this time.

## 2020-11-25 ENCOUNTER — Observation Stay (HOSPITAL_COMMUNITY): Payer: Medicaid Other

## 2020-11-25 ENCOUNTER — Encounter (HOSPITAL_COMMUNITY): Payer: Self-pay | Admitting: Obstetrics and Gynecology

## 2020-11-25 ENCOUNTER — Encounter (HOSPITAL_COMMUNITY)
Admission: AD | Disposition: A | Discharge status: Home / Self Care | Payer: Self-pay | Source: Home / Self Care | Attending: Obstetrics and Gynecology

## 2020-11-25 DIAGNOSIS — I81 Portal vein thrombosis: Secondary | ICD-10-CM | POA: Diagnosis not present

## 2020-11-25 DIAGNOSIS — R7989 Other specified abnormal findings of blood chemistry: Secondary | ICD-10-CM | POA: Diagnosis not present

## 2020-11-25 DIAGNOSIS — O26891 Other specified pregnancy related conditions, first trimester: Secondary | ICD-10-CM | POA: Diagnosis not present

## 2020-11-25 DIAGNOSIS — Z20822 Contact with and (suspected) exposure to covid-19: Secondary | ICD-10-CM | POA: Diagnosis not present

## 2020-11-25 DIAGNOSIS — K811 Chronic cholecystitis: Secondary | ICD-10-CM | POA: Diagnosis not present

## 2020-11-25 DIAGNOSIS — R945 Abnormal results of liver function studies: Secondary | ICD-10-CM | POA: Diagnosis not present

## 2020-11-25 DIAGNOSIS — O211 Hyperemesis gravidarum with metabolic disturbance: Secondary | ICD-10-CM | POA: Diagnosis not present

## 2020-11-25 DIAGNOSIS — O26612 Liver and biliary tract disorders in pregnancy, second trimester: Secondary | ICD-10-CM

## 2020-11-25 DIAGNOSIS — Z3A15 15 weeks gestation of pregnancy: Secondary | ICD-10-CM | POA: Diagnosis not present

## 2020-11-25 DIAGNOSIS — O99612 Diseases of the digestive system complicating pregnancy, second trimester: Secondary | ICD-10-CM | POA: Diagnosis not present

## 2020-11-25 DIAGNOSIS — R1011 Right upper quadrant pain: Secondary | ICD-10-CM | POA: Diagnosis not present

## 2020-11-25 DIAGNOSIS — K8020 Calculus of gallbladder without cholecystitis without obstruction: Secondary | ICD-10-CM | POA: Diagnosis not present

## 2020-11-25 DIAGNOSIS — K8000 Calculus of gallbladder with acute cholecystitis without obstruction: Secondary | ICD-10-CM | POA: Diagnosis not present

## 2020-11-25 DIAGNOSIS — O132 Gestational [pregnancy-induced] hypertension without significant proteinuria, second trimester: Secondary | ICD-10-CM | POA: Diagnosis not present

## 2020-11-25 DIAGNOSIS — R339 Retention of urine, unspecified: Secondary | ICD-10-CM | POA: Diagnosis not present

## 2020-11-25 HISTORY — PX: CHOLECYSTECTOMY: SHX55

## 2020-11-25 LAB — COMPREHENSIVE METABOLIC PANEL
ALT: 530 U/L — ABNORMAL HIGH (ref 0–44)
AST: 246 U/L — ABNORMAL HIGH (ref 15–41)
Albumin: 2.4 g/dL — ABNORMAL LOW (ref 3.5–5.0)
Alkaline Phosphatase: 64 U/L (ref 38–126)
Anion gap: 7 (ref 5–15)
BUN: 11 mg/dL (ref 6–20)
CO2: 21 mmol/L — ABNORMAL LOW (ref 22–32)
Calcium: 8.4 mg/dL — ABNORMAL LOW (ref 8.9–10.3)
Chloride: 104 mmol/L (ref 98–111)
Creatinine, Ser: 0.55 mg/dL (ref 0.44–1.00)
GFR, Estimated: >60 mL/min (ref >60)
Glucose, Bld: 89 mg/dL (ref 70–99)
Potassium: 2.6 mmol/L — CL (ref 3.5–5.1)
Sodium: 132 mmol/L — ABNORMAL LOW (ref 135–145)
Total Bilirubin: 4.1 mg/dL — ABNORMAL HIGH (ref 0.3–1.2)
Total Protein: 5.5 g/dL — ABNORMAL LOW (ref 6.5–8.1)

## 2020-11-25 LAB — HEPATIC FUNCTION PANEL
ALT: 507 U/L — ABNORMAL HIGH (ref 0–44)
AST: 261 U/L — ABNORMAL HIGH (ref 15–41)
Albumin: 2.4 g/dL — ABNORMAL LOW (ref 3.5–5.0)
Alkaline Phosphatase: 60 U/L (ref 38–126)
Bilirubin, Direct: 1.4 mg/dL — ABNORMAL HIGH (ref 0.0–0.2)
Indirect Bilirubin: 2 mg/dL — ABNORMAL HIGH (ref 0.3–0.9)
Total Bilirubin: 3.4 mg/dL — ABNORMAL HIGH (ref 0.3–1.2)
Total Protein: 5.1 g/dL — ABNORMAL LOW (ref 6.5–8.1)

## 2020-11-25 LAB — CBC
HCT: 31.4 % — ABNORMAL LOW (ref 36.0–46.0)
Hemoglobin: 10.8 g/dL — ABNORMAL LOW (ref 12.0–15.0)
MCH: 26.5 pg (ref 26.0–34.0)
MCHC: 34.4 g/dL (ref 30.0–36.0)
MCV: 77.1 fL — ABNORMAL LOW (ref 80.0–100.0)
Platelets: 170 10*3/uL (ref 150–400)
RBC: 4.07 MIL/uL (ref 3.87–5.11)
RDW: 14.2 % (ref 11.5–15.5)
WBC: 8.1 10*3/uL (ref 4.0–10.5)
nRBC: 0 % (ref 0.0–0.2)

## 2020-11-25 LAB — FOLATE: Folate: 5.9 ng/mL — ABNORMAL LOW (ref >5.9)

## 2020-11-25 LAB — RETICULOCYTES
Immature Retic Fract: 9.6 % (ref 2.3–15.9)
RBC.: 3.88 MIL/uL (ref 3.87–5.11)
Retic Count, Absolute: 84.6 10*3/uL (ref 19.0–186.0)
Retic Ct Pct: 2.2 % (ref 0.4–3.1)

## 2020-11-25 LAB — VITAMIN B12: Vitamin B-12: 465 pg/mL (ref 180–914)

## 2020-11-25 LAB — HEPATITIS PANEL, ACUTE
HCV Ab: NONREACTIVE
Hep A IgM: NONREACTIVE
Hep B C IgM: NONREACTIVE
Hepatitis B Surface Ag: NONREACTIVE

## 2020-11-25 LAB — IRON AND TIBC
Iron: 183 ug/dL — ABNORMAL HIGH (ref 28–170)
Saturation Ratios: 73 % — ABNORMAL HIGH (ref 10.4–31.8)
TIBC: 251 ug/dL (ref 250–450)
UIBC: 68 ug/dL

## 2020-11-25 LAB — FERRITIN: Ferritin: 282 ng/mL (ref 11–307)

## 2020-11-25 SURGERY — LAPAROSCOPIC CHOLECYSTECTOMY
Anesthesia: General

## 2020-11-25 MED ORDER — FENTANYL CITRATE (PF) 100 MCG/2ML IJ SOLN
INTRAMUSCULAR | Status: AC
  Filled 2020-11-25: qty 2

## 2020-11-25 MED ORDER — FENTANYL CITRATE (PF) 100 MCG/2ML IJ SOLN
INTRAMUSCULAR | Status: DC | PRN
  Administered 2020-11-25 (×2): 50 ug via INTRAVENOUS
  Administered 2020-11-25: 100 ug via INTRAVENOUS
  Administered 2020-11-25: 50 ug via INTRAVENOUS

## 2020-11-25 MED ORDER — ONDANSETRON HCL 4 MG/2ML IJ SOLN
INTRAMUSCULAR | Status: DC | PRN
  Administered 2020-11-25: 4 mg via INTRAVENOUS

## 2020-11-25 MED ORDER — OXYCODONE HCL 5 MG/5ML PO SOLN: 5.0000 mg | Freq: Once | ORAL | Status: DC | PRN

## 2020-11-25 MED ORDER — FENTANYL CITRATE (PF) 250 MCG/5ML IJ SOLN
INTRAMUSCULAR | Status: AC
  Filled 2020-11-25: qty 5

## 2020-11-25 MED ORDER — LIDOCAINE 2% (20 MG/ML) 5 ML SYRINGE
INTRAMUSCULAR | Status: AC
  Filled 2020-11-25: qty 5

## 2020-11-25 MED ORDER — ORAL CARE MOUTH RINSE: 15.0000 mL | Freq: Once | OROMUCOSAL | Status: AC

## 2020-11-25 MED ORDER — CYCLOBENZAPRINE HCL 10 MG PO TABS
10.0000 mg | ORAL_TABLET | Freq: Three times a day (TID) | ORAL | Status: DC | PRN
  Administered 2020-11-27: 10 mg via ORAL
  Filled 2020-11-25: qty 1

## 2020-11-25 MED ORDER — PHENYLEPHRINE 40 MCG/ML (10ML) SYRINGE FOR IV PUSH (FOR BLOOD PRESSURE SUPPORT)
PREFILLED_SYRINGE | INTRAVENOUS | Status: AC
  Filled 2020-11-25: qty 10

## 2020-11-25 MED ORDER — SUCCINYLCHOLINE CHLORIDE 200 MG/10ML IV SOSY
PREFILLED_SYRINGE | INTRAVENOUS | Status: DC | PRN
  Administered 2020-11-25: 140 mg via INTRAVENOUS

## 2020-11-25 MED ORDER — POTASSIUM CHLORIDE 10 MEQ/100ML IV SOLN
10.0000 meq | INTRAVENOUS | Status: AC
  Administered 2020-11-25 (×5): 10 meq via INTRAVENOUS
  Filled 2020-11-25 (×5): qty 100

## 2020-11-25 MED ORDER — SUCCINYLCHOLINE CHLORIDE 200 MG/10ML IV SOSY
PREFILLED_SYRINGE | INTRAVENOUS | Status: AC
  Filled 2020-11-25: qty 10

## 2020-11-25 MED ORDER — ACETAMINOPHEN 500 MG PO TABS
1000.0000 mg | ORAL_TABLET | Freq: Four times a day (QID) | ORAL | Status: DC
  Administered 2020-11-25 – 2020-11-27 (×4): 1000 mg via ORAL
  Filled 2020-11-25 (×5): qty 2

## 2020-11-25 MED ORDER — OXYCODONE-ACETAMINOPHEN 5-325 MG PO TABS: 1.0000 | ORAL_TABLET | ORAL | Status: DC | PRN

## 2020-11-25 MED ORDER — METHOCARBAMOL 500 MG PO TABS: 1000.0000 mg | ORAL_TABLET | Freq: Three times a day (TID) | ORAL | Status: DC

## 2020-11-25 MED ORDER — OXYCODONE HCL 5 MG/5ML PO SOLN
5.0000 mg | ORAL | Status: DC | PRN
Start: 2020-11-25 — End: 2020-11-30
  Administered 2020-11-27 – 2020-11-29 (×4): 10 mg via ORAL
  Administered 2020-11-30: 5 mg via ORAL
  Filled 2020-11-25 (×3): qty 10
  Filled 2020-11-25: qty 5
  Filled 2020-11-25 (×4): qty 10

## 2020-11-25 MED ORDER — ROCURONIUM BROMIDE 10 MG/ML (PF) SYRINGE
PREFILLED_SYRINGE | INTRAVENOUS | Status: DC | PRN
  Administered 2020-11-25: 40 mg via INTRAVENOUS

## 2020-11-25 MED ORDER — BUPIVACAINE-EPINEPHRINE (PF) 0.25% -1:200000 IJ SOLN
INTRAMUSCULAR | Status: AC
  Filled 2020-11-25: qty 30

## 2020-11-25 MED ORDER — LACTATED RINGERS IV SOLN: INTRAVENOUS | Status: DC

## 2020-11-25 MED ORDER — BUPIVACAINE-EPINEPHRINE 0.25% -1:200000 IJ SOLN
INTRAMUSCULAR | Status: DC | PRN
  Administered 2020-11-25: 9 mL

## 2020-11-25 MED ORDER — FENTANYL CITRATE (PF) 100 MCG/2ML IJ SOLN
25.0000 ug | INTRAMUSCULAR | Status: DC | PRN
  Administered 2020-11-25: 25 ug via INTRAVENOUS

## 2020-11-25 MED ORDER — NEOSTIGMINE METHYLSULFATE 3 MG/3ML IV SOSY
PREFILLED_SYRINGE | INTRAVENOUS | Status: AC
  Filled 2020-11-25: qty 3

## 2020-11-25 MED ORDER — NEOSTIGMINE METHYLSULFATE 5 MG/5ML IV SOSY
PREFILLED_SYRINGE | INTRAVENOUS | Status: DC | PRN
  Administered 2020-11-25: 3 mg via INTRAVENOUS
  Administered 2020-11-25: 1 mg via INTRAVENOUS

## 2020-11-25 MED ORDER — PROMETHAZINE HCL 25 MG/ML IJ SOLN: 6.2500 mg | INTRAMUSCULAR | Status: DC | PRN

## 2020-11-25 MED ORDER — ROCURONIUM BROMIDE 10 MG/ML (PF) SYRINGE
PREFILLED_SYRINGE | INTRAVENOUS | Status: AC
  Filled 2020-11-25: qty 10

## 2020-11-25 MED ORDER — PHENYLEPHRINE 40 MCG/ML (10ML) SYRINGE FOR IV PUSH (FOR BLOOD PRESSURE SUPPORT)
PREFILLED_SYRINGE | INTRAVENOUS | Status: DC | PRN
  Administered 2020-11-25: 80 ug via INTRAVENOUS

## 2020-11-25 MED ORDER — LABETALOL HCL 5 MG/ML IV SOLN
INTRAVENOUS | Status: DC | PRN
  Administered 2020-11-25: 10 mg via INTRAVENOUS

## 2020-11-25 MED ORDER — MORPHINE SULFATE (PF) 2 MG/ML IV SOLN
2.0000 mg | INTRAVENOUS | Status: DC | PRN
  Administered 2020-11-25: 4 mg via INTRAVENOUS
  Administered 2020-11-26 – 2020-11-27 (×5): 2 mg via INTRAVENOUS
  Administered 2020-11-27 (×2): 4 mg via INTRAVENOUS
  Administered 2020-11-28: 2 mg via INTRAVENOUS
  Administered 2020-11-28: 4 mg via INTRAVENOUS
  Administered 2020-11-29: 2 mg via INTRAVENOUS
  Administered 2020-11-29: 4 mg via INTRAVENOUS
  Administered 2020-11-29: 2 mg via INTRAVENOUS
  Filled 2020-11-25 (×4): qty 1
  Filled 2020-11-25 (×2): qty 2
  Filled 2020-11-25 (×3): qty 1
  Filled 2020-11-25: qty 2
  Filled 2020-11-25: qty 1
  Filled 2020-11-25 (×2): qty 2

## 2020-11-25 MED ORDER — CHLORHEXIDINE GLUCONATE 0.12 % MT SOLN
15.0000 mL | Freq: Once | OROMUCOSAL | Status: AC
  Administered 2020-11-25: 15 mL via OROMUCOSAL
  Filled 2020-11-25: qty 15

## 2020-11-25 MED ORDER — 0.9 % SODIUM CHLORIDE (POUR BTL) OPTIME
TOPICAL | Status: DC | PRN
  Administered 2020-11-25: 1000 mL

## 2020-11-25 MED ORDER — CEFAZOLIN SODIUM-DEXTROSE 2-3 GM-%(50ML) IV SOLR
INTRAVENOUS | Status: DC | PRN
  Administered 2020-11-25: 2 g via INTRAVENOUS

## 2020-11-25 MED ORDER — OXYCODONE HCL 5 MG PO TABS: 5.0000 mg | ORAL_TABLET | Freq: Once | ORAL | Status: DC | PRN

## 2020-11-25 MED ORDER — MORPHINE SULFATE 2 MG/ML IJ SOLN
2.0000 mg | Freq: Once | INTRAMUSCULAR | Status: DC
  Filled 2020-11-25: qty 1

## 2020-11-25 MED ORDER — SODIUM CHLORIDE 0.9 % IR SOLN
Status: DC | PRN
  Administered 2020-11-25: 1000 mL

## 2020-11-25 MED ORDER — ATROPINE SULFATE 0.4 MG/ML IV SOSY
PREFILLED_SYRINGE | INTRAVENOUS | Status: DC | PRN
  Administered 2020-11-25: .4 mg via INTRAVENOUS
  Administered 2020-11-25: .2 mg via INTRAVENOUS

## 2020-11-25 MED ORDER — LIDOCAINE 2% (20 MG/ML) 5 ML SYRINGE
INTRAMUSCULAR | Status: DC | PRN
Start: 2020-11-25 — End: 2020-11-25
  Administered 2020-11-25: 80 mg via INTRAVENOUS

## 2020-11-25 MED ORDER — CEFAZOLIN SODIUM-DEXTROSE 2-4 GM/100ML-% IV SOLN
INTRAVENOUS | Status: AC
  Filled 2020-11-25: qty 100

## 2020-11-25 MED ORDER — KETOROLAC TROMETHAMINE 30 MG/ML IJ SOLN
30.0000 mg | Freq: Four times a day (QID) | INTRAMUSCULAR | Status: DC
  Administered 2020-11-25 – 2020-11-26 (×2): 30 mg via INTRAVENOUS
  Filled 2020-11-25 (×3): qty 1

## 2020-11-25 MED ORDER — LABETALOL HCL 5 MG/ML IV SOLN
INTRAVENOUS | Status: AC
  Filled 2020-11-25: qty 4

## 2020-11-25 MED ORDER — PROPOFOL 10 MG/ML IV BOLUS
INTRAVENOUS | Status: DC | PRN
  Administered 2020-11-25: 150 mg via INTRAVENOUS

## 2020-11-25 MED ORDER — AMISULPRIDE (ANTIEMETIC) 5 MG/2ML IV SOLN: 10.0000 mg | Freq: Once | INTRAVENOUS | Status: DC | PRN

## 2020-11-25 MED ORDER — ACETAMINOPHEN 500 MG PO TABS
1000.0000 mg | ORAL_TABLET | Freq: Once | ORAL | Status: AC
  Administered 2020-11-25: 1000 mg via ORAL
  Filled 2020-11-25: qty 2

## 2020-11-25 SURGICAL SUPPLY — 43 items
ADH SKN CLS APL DERMABOND .7 (GAUZE/BANDAGES/DRESSINGS) ×1
APL PRP STRL LF DISP 70% ISPRP (MISCELLANEOUS) ×1
APPLIER CLIP ROT 10 11.4 M/L (STAPLE) ×2
APR CLP MED LRG 11.4X10 (STAPLE) ×1
BAG COUNTER SPONGE SURGICOUNT (BAG) ×2 IMPLANT
BAG SPEC RTRVL 10 TROC 200 (ENDOMECHANICALS) ×1
BAG SPNG CNTER NS LX DISP (BAG) ×1
BLADE CLIPPER SURG (BLADE) ×2 IMPLANT
CANISTER SUCT 3000ML PPV (MISCELLANEOUS) ×2 IMPLANT
CHLORAPREP W/TINT 26 (MISCELLANEOUS) ×2 IMPLANT
CLIP APPLIE ROT 10 11.4 M/L (STAPLE) ×1 IMPLANT
COVER SURGICAL LIGHT HANDLE (MISCELLANEOUS) ×2 IMPLANT
DERMABOND ADVANCED (GAUZE/BANDAGES/DRESSINGS) ×1
DERMABOND ADVANCED .7 DNX12 (GAUZE/BANDAGES/DRESSINGS) ×1 IMPLANT
ELECT REM PT RETURN 9FT ADLT (ELECTROSURGICAL) ×2
ELECTRODE REM PT RTRN 9FT ADLT (ELECTROSURGICAL) ×1 IMPLANT
GLOVE SRG 8 PF TXTR STRL LF DI (GLOVE) ×1 IMPLANT
GLOVE SURG ENC MOIS LTX SZ8 (GLOVE) ×2 IMPLANT
GLOVE SURG UNDER POLY LF SZ8 (GLOVE) ×2
GOWN STRL REUS W/ TWL LRG LVL3 (GOWN DISPOSABLE) ×2 IMPLANT
GOWN STRL REUS W/ TWL XL LVL3 (GOWN DISPOSABLE) ×1 IMPLANT
GOWN STRL REUS W/TWL LRG LVL3 (GOWN DISPOSABLE) ×4
GOWN STRL REUS W/TWL XL LVL3 (GOWN DISPOSABLE) ×2
KIT BASIN OR (CUSTOM PROCEDURE TRAY) ×2 IMPLANT
KIT TURNOVER KIT B (KITS) ×2 IMPLANT
NS IRRIG 1000ML POUR BTL (IV SOLUTION) ×2 IMPLANT
PAD ARMBOARD 7.5X6 YLW CONV (MISCELLANEOUS) ×2 IMPLANT
POUCH RETRIEVAL ECOSAC 10 (ENDOMECHANICALS) ×1 IMPLANT
POUCH RETRIEVAL ECOSAC 10MM (ENDOMECHANICALS) ×2
SCISSORS LAP 5X35 DISP (ENDOMECHANICALS) ×2 IMPLANT
SET IRRIG TUBING LAPAROSCOPIC (IRRIGATION / IRRIGATOR) ×2 IMPLANT
SET TUBE SMOKE EVAC HIGH FLOW (TUBING) ×2 IMPLANT
SLEEVE ENDOPATH XCEL 5M (ENDOMECHANICALS) ×2 IMPLANT
SPECIMEN JAR SMALL (MISCELLANEOUS) ×2 IMPLANT
SUT MNCRL AB 4-0 PS2 18 (SUTURE) ×2 IMPLANT
TOWEL GREEN STERILE (TOWEL DISPOSABLE) ×2 IMPLANT
TOWEL GREEN STERILE FF (TOWEL DISPOSABLE) ×2 IMPLANT
TRAY LAPAROSCOPIC MC (CUSTOM PROCEDURE TRAY) ×2 IMPLANT
TROCAR XCEL BLUNT TIP 100MML (ENDOMECHANICALS) ×2 IMPLANT
TROCAR XCEL NON-BLD 11X100MML (ENDOMECHANICALS) ×2 IMPLANT
TROCAR XCEL NON-BLD 5MMX100MML (ENDOMECHANICALS) ×2 IMPLANT
WARMER LAPAROSCOPE (MISCELLANEOUS) ×2 IMPLANT
WATER STERILE IRR 1000ML POUR (IV SOLUTION) ×2 IMPLANT

## 2020-11-25 NOTE — Progress Notes (Signed)
Date and time results received: 11/25/20 0800 (use smartphrase ".now" to insert current time)  Test: K+ Critical Value: 2.6  Name of Provider Notified: Dr. Melvenia Beam   Orders Received? Or Actions Taken?:  No orders received, will pass on to the second attending who is getting report now and will be rounding on her soon.

## 2020-11-25 NOTE — Progress Notes (Signed)
RN received PC from [facility] OR stating that they were sending transport to pick up patient for surgery. RN unaware of scheduled surgical procedure. RN to pt room to prep patient for surgery and then returning call to [facility], RN to give report on patient. K+ mini bags to be sent with patient to continue treatment during her pre-op time per [facility] RN request.Morissa RN also notified that patient was unable to remove her tongue ring. Pre-op checklist completed.

## 2020-11-25 NOTE — Progress Notes (Signed)
Subjective: Pt seen postop in her room.  Appropriate post op pain noted.  Pt is unsure if previous pain has improved or not.  GI consult noted and appreciate assistance.  Objective: Vital signs in last 24 hours: Temp:  [97.2 F (36.2 C)-98.9 F (37.2 C)] 98.4 F (36.9 C) (07/28 1703) Pulse Rate:  [89-109] 105 (07/28 1703) Resp:  [13-31] 21 (07/28 1703) BP: (119-144)/(76-92) 143/82 (07/28 1703) SpO2:  [98 %-100 %] 100 % (07/28 1703) Weight:  [58.1 kg] 58.1 kg (07/28 1254) Weight change:   Intake/Output from previous day: 07/27 0701 - 07/28 0700 In: 3337 [I.V.:1368.5; IV Piggyback:1968.5] Out: 0  Intake/Output this shift: Total I/O In: 1100 [I.V.:1100] Out: 30 [Blood:30]  General appearance: alert, cooperative, and mild distress  Lab Results: Recent Labs    11/24/20 1420 11/25/20 0652  WBC 12.0* 8.1  HGB 14.2 10.8*  HCT 41.7 31.4*  PLT 252 170   BMET:  Recent Labs    11/24/20 1420 11/25/20 0652  NA 131* 132*  K 3.0* 2.6*  CL 98 104  CO2 18* 21*  GLUCOSE 106* 89  BUN 9 11  CREATININE 0.78 0.55  CALCIUM 9.9 8.4*    FHT post op: 140   Studies/Results: MR ABDOMEN MRCP WO CONTRAST  Result Date: 11/24/2020 CLINICAL DATA:  Abdominal pain, cholelithiasis and intractable emesis. EXAM: MRI ABDOMEN WITHOUT CONTRAST  (INCLUDING MRCP) TECHNIQUE: Multiplanar multisequence MR imaging of the abdomen was performed. Heavily T2-weighted images of the biliary and pancreatic ducts were obtained, and three-dimensional MRCP images were rendered by post processing. COMPARISON:  Ultrasound 11/19/2020 FINDINGS: Lower chest: Lung bases are clear. Normal heart size. No pericardial effusion. Hepatobiliary: Normal intrinsic liver signal. No significant signal dropout on in and out of phase imaging. No visible concerning focal liver lesion. Smooth liver surface contour. No significant gallbladder wall thickening. No pericholecystic fluid or edematous signal. Gradient signal within the  gallbladder lumen compatible with some layering biliary sludge. No angular filling defects are identified within the gallbladder lumen or imaged biliary tree. Intra and extrahepatic biliary caliber is within normal limits. Pancreas: No pancreatic ductal dilatation or surrounding inflammatory changes. Spleen:  Normal in size. No concerning splenic lesions. Adrenals/Urinary Tract: Normal adrenals. No worrisome renal masses. No evidence of hydronephrosis. Stomach/Bowel: Visualized portions within the abdomen are unremarkable. No evidence of bowel obstruction. No worrisome mesenteric findings. Vascular/Lymphatic: No pathologically enlarged lymph nodes identified. No abdominal aortic aneurysm demonstrated. Other: Wide field of view images demonstrated gravid uterus, incompletely assessed on this exam of the abdomen. No visible free intraperitoneal fluid. No bowel containing hernias. Musculoskeletal: Osseous structures are unremarkable with grossly normal marrow signal. Normal cord signal. IMPRESSION: 1. Gradient signal within the gallbladder lumen, compatible with biliary sludge. 2. No discernible angular filling defects to suggest cholelithiasis or choledocholithiasis. 3. No MR evidence of cholecystitis or biliary ductal dilatation. 4. Gravid uterus partially visualized. 5. Otherwise unremarkable abdominal MR. Electronically Signed   By: Kreg Shropshire M.D.   On: 11/24/2020 22:15    I have reviewed the patient's current medications.  Assessment/Plan: POD 0 s/p lap cholecystectomy No evidence of significant post op uterine activity Post surgical care per surgery Liver eval per GI with labs, appreciate assistance Will recheck LFT, lipase and CBC in AM Otherwise routine PNC for early second trimester.    LOS: 0 days   Warden Fillers 11/25/2020,5:58 PM

## 2020-11-25 NOTE — Anesthesia Postprocedure Evaluation (Signed)
Anesthesia Post Note  Patient: CERISSA ZEIGER  Procedure(s) Performed: LAPAROSCOPIC CHOLECYSTECTOMY     Patient location during evaluation: PACU Anesthesia Type: General Level of consciousness: awake and alert Pain management: pain level controlled Vital Signs Assessment: post-procedure vital signs reviewed and stable Respiratory status: spontaneous breathing, nonlabored ventilation, respiratory function stable and patient connected to nasal cannula oxygen Cardiovascular status: blood pressure returned to baseline and stable Postop Assessment: no apparent nausea or vomiting Anesthetic complications: no   No notable events documented.  Last Vitals:  Vitals:   11/25/20 1804 11/25/20 1805  BP: 140/77   Pulse: (!) 105   Resp: (!) 21   Temp: 36.9 C   SpO2:  100%    Last Pain:  Vitals:   11/25/20 1918  TempSrc:   PainSc: 10-Worst pain ever                 Lajoyce Tamura

## 2020-11-25 NOTE — Consult Note (Addendum)
Referring Provider:  [facility]         Primary Care Physician:  Patient, No Pcp Per (Inactive) Primary Gastroenterologist: Gentry Fitz We were asked to see this patient for:  Elevated liver tests                ASSESSMENT / PLAN   # 25 yo female, [redacted] weeks gestation admitted with several weeks of nausea / vomiting,  new RUQ pain, and progressive elevation of liver chemistries ( mixed pattern but mainly hepatocellular). She had known cholelithiasis but no biliary duct dilation,  choledocholithiasis or acute cholecystitis on [place] or MRCP but General Surgery took her for a lap cholecystectomy today. Surgery has asked to evaluate abnormal liver tests.  Liver enzymes seem higher than what would be expected for hyperemesis gravidarum.  It seems early in pregnancy to be concerned about acute fatty liver of pregnancy or HELLP. No reason to suspect drug-induced liver injury.  Diagnostic possibilities include but are not limited to autoimmune disease, Wilson's disease, viral etiology.  -- Obtaining ANA, anti-smooth muscle antibody, antimitochondrial antibody, IgG, acute viral hepatitis panel, CMV serology, HSV serology, ceruloplasmin, iron studies, fractionated bilirubin and liver Doppler study --am LFTs, CBC and INR   HISTORY OF PRESENT ILLNESS                                                                                                                         Chief Complaint: none  Deborah Evans is a 25 y.o. female with a past medical history significant for cholelithiasis s/p cholecystectomy (today). She is otherwise healthy.    Patient is G3P1011 at [redacted] weeks gestation. She is being seen in PACU. She is sleepy , not sure how much she feels like talking but answers questions appropriately. Patient  was seen in ED in June and a couple of times this month with nausea and vomiting despite zofran and compazine.  Previous ultrasound showed cholelithiasis . She re-presented to [facility] yesterday with  ongoing nausea and vomiting as well as new onset RUQ pain She endorses "burning" RUQ which is intermittent and exacerbated by eating. At home patient has not been experiencing any fevers.  No pruritus.  Patient does not take NSAIDs.  She does not take any vitamins/supplements.  No alcohol use.  No illicit drug use . No known family history of liver disease.  Patient says she had no liver problems with her first pregnancy (2019).  She endorses chronic constipation described as decreased frequency of bowel movements.  She has never taken anything medication wise for constipation  Patient's liver enzymes have been on an upward trend over the last few weeks (ED visits).  Liver enzymes were normal on 10/30/2020.  On 11/07/2020 she had a mild elevation in AST but her ALT had doubled to 80.  On 11/19/2020 AST was up to 75 and ALT rose further to 146.  In the ED yesterday her AST had risen to 362, ALT 694.  Her bilirubin  was up from 1.3-4.3.  Alkaline phos has remained normal.  Patient was admitted for further work-up.  RUQ ultrasound showed cholelithiasis/biliary sludge but no evidence for acute cholecystitis.  The exam was challenging due to her discomfort and inability to hold her breath.  For further evaluation she underwent an MRCP which showed biliary sludge.  No evidence for cholelithiasis , choledocholithiasis. No acute cholecystitis.   SIGNIFICANT DIAGNOTIC STUDIES    MRCP IMPRESSION: 1. Gradient signal within the gallbladder lumen, compatible with biliary sludge. 2. No discernible angular filling defects to suggest cholelithiasis or choledocholithiasis. 3. No MR evidence of cholecystitis or biliary ductal dilatation. 4. Gravid uterus partially visualized. 5. Otherwise unremarkable abdominal MR.   PREVIOUS ENDOSCOPIC EVALUATIONS   none  Past Medical History:  Diagnosis Date   Depression    when going through hyperemesis with first preg, ok now   Hyperemesis gravidarum     Past Surgical  History:  Procedure Laterality Date   DILATION AND CURETTAGE OF UTERUS N/A 07/15/2014   Procedure: SUCTION DILATATION AND CURETTAGE;  Surgeon: Lazaro Arms, MD;  Location: [facility];  Service: Gynecology;  Laterality: N/A;    Prior to Admission medications   Medication Sig Start Date End Date Taking? Authorizing Provider  Doxylamine-Pyridoxine 10-10 MG TBEC Take 2 tabs at bedtime. If needed, add another tab in the morning. If needed, add another tab in the afternoon, up to 4 tabs/day. 11/03/20  Yes Gerrit Heck, CNM  famotidine (PEPCID) 10 MG tablet Take 1 tablet (10 mg total) by mouth 2 (two) times daily. 10/30/20 11/29/20 Yes Weinhold, Samantha C, CNM  methylPREDNISolone (MEDROL) 8 MG tablet Day 1-5: 2 tabs TID, Day 6-7: 1 tab TID, Day 8-10 half tab TID, Day 11-12 1 tab q AM, Day 13-14: half tab q AM 11/19/20  Yes Kooistra, Charlesetta Garibaldi, CNM  ondansetron (ZOFRAN ODT) 8 MG disintegrating tablet Take 1 tablet (8 mg total) by mouth every 8 (eight) hours as needed for refractory nausea / vomiting. 11/17/20  Yes Judeth Horn, NP  prochlorperazine (COMPAZINE) 10 MG tablet Take 1 tablet (10 mg total) by mouth in the morning and at bedtime. 10/31/20  Yes Armando Reichert, CNM  promethazine (PHENERGAN) 25 MG tablet Take 1 tablet (25 mg total) by mouth every 6 (six) hours as needed for nausea or vomiting. 11/17/20  Yes Judeth Horn, NP  potassium chloride (KLOR-CON) 20 MEQ packet Take 20 mEq by mouth daily for 2 days. 11/17/20 11/19/20  Judeth Horn, NP  Prenatal MV-Min-FA-Omega-3 (PRENATAL GUMMIES/DHA & FA PO) Take 2 tablets by mouth daily.     [provider]  promethazine (PHENERGAN) 25 MG suppository Place 1 suppository (25 mg total) rectally every 6 (six) hours as needed for nausea or vomiting. 10/29/20   Cheryll Cockayne, MD  scopolamine (TRANSDERM-SCOP) 1 MG/3DAYS Place 1 patch (1.5 mg total) onto the skin every 3 (three) days. 11/03/20   Thressa Sheller D, CNM    Current Facility-Administered  Medications  Medication Dose Route Frequency Provider Last Rate Last Admin   [MAR Hold] acetaminophen (TYLENOL) tablet 650 mg  650 mg Oral Q4H PRN Warden Fillers, MD   650 mg at 11/24/20 1711   amisulpride (BARHEMSYS) injection 10 mg  10 mg Intravenous Once PRN Mellody Dance, MD       [facility] Hold] calcium carbonate (TUMS - dosed in mg elemental calcium) chewable tablet 400 mg of elemental calcium  2 tablet Oral Q4H PRN Warden Fillers, MD   400  mg of elemental calcium at 11/25/20 0616   [MAR Hold] docusate sodium (COLACE) capsule 100 mg  100 mg Oral Daily Warden Fillers, MD   100 mg at 11/24/20 1945   fentaNYL (SUBLIMAZE) 100 MCG/2ML injection            fentaNYL (SUBLIMAZE) injection 25-50 mcg  25-50 mcg Intravenous Q5 min PRN Mellody Dance, MD   25 mcg at 11/25/20 1527   lactated ringers infusion   Intravenous Continuous Warner Mccreedy, MD 125 mL/hr at 11/25/20 1333 Restarted at 11/25/20 1454   lactated ringers infusion   Intravenous Continuous Mellody Dance, MD 10 mL/hr at 11/25/20 1301 New Bag at 11/25/20 1301   oxyCODONE (Oxy IR/ROXICODONE) immediate release tablet 5 mg  5 mg Oral Once PRN Mellody Dance, MD       Or   oxyCODONE (ROXICODONE) 5 MG/5ML solution 5 mg  5 mg Oral Once PRN Mellody Dance, MD       [facility] Hold] prenatal multivitamin tablet 1 tablet  1 tablet Oral Q1200 Warden Fillers, MD       [MAR Hold] promethazine (PHENERGAN) 25 mg in sodium chloride 0.9 % 50 mL IVPB  25 mg Intravenous Q6H PRN Warner Mccreedy, MD   Stopped at 11/24/20 1527   promethazine (PHENERGAN) injection 6.25-12.5 mg  6.25-12.5 mg Intravenous Q15 min PRN Mellody Dance, MD       [MAR Hold] scopolamine (TRANSDERM-SCOP) 1 MG/3DAYS 1.5 mg  1 patch Transdermal Q72H Warner Mccreedy, MD   1.5 mg at 11/24/20 1607   [MAR Hold] thiamine (B-1) injection 100 mg  100 mg Intravenous Daily Warner Mccreedy, MD   100 mg at 11/25/20 1119   [MAR Hold] zolpidem (AMBIEN) tablet 5 mg  5 mg Oral QHS PRN Warden Fillers, MD        Allergies  as of 11/24/2020   (No Known Allergies)    Family History  Problem Relation Age of Onset   Healthy Mother    Aneurysm Father    Hypertension Maternal Grandmother    ADD / ADHD Neg Hx    Alcohol abuse Neg Hx    Anxiety disorder Neg Hx    Arthritis Neg Hx    Birth defects Neg Hx    Asthma Neg Hx    Cancer Neg Hx    COPD Neg Hx    Depression Neg Hx    Diabetes Neg Hx    Drug abuse Neg Hx    Early death Neg Hx    Hearing loss Neg Hx    Heart disease Neg Hx    Hyperlipidemia Neg Hx    Intellectual disability Neg Hx    Kidney disease Neg Hx    Learning disabilities Neg Hx    Miscarriages / Stillbirths Neg Hx    Obesity Neg Hx    Stroke Neg Hx    Vision loss Neg Hx    Varicose Veins Neg Hx     Social History   Socioeconomic History   Marital status: Single    Spouse name: Not on file   Number of children: Not on file   Years of education: Not on file   Highest education level: Not on file  Occupational History   Occupation: none  Tobacco Use   Smoking status: Never   Smokeless tobacco: Never  Vaping Use   Vaping Use: Never used  Substance and Sexual Activity   Alcohol use: No   Drug use: No  Sexual activity: Not Currently    Birth control/protection: None  Other Topics Concern   Not on file  Social History Narrative   Not on file   Social Determinants of Health   Financial Resource Strain: Not on file  Food Insecurity: Not on file  Transportation Needs: Not on file  Physical Activity: Not on file  Stress: Not on file  Social Connections: Not on file  Intimate Partner Violence: Not on file    Review of Systems: All systems reviewed and negative except where noted in HPI.  OBJECTIVE   Physical Exam   Physical Exam: Vital signs in last 24 hours: Temp:  [97.8 F (36.6 C)-98.9 F (37.2 C)] 98 F (36.7 C) (07/28 1500) Pulse Rate:  [93-106] 93 (07/28 1559) Resp:  [13-23] 13 (07/28 1559) BP: (119-144)/(76-92) 142/86 (07/28 1559) SpO2:  [98 %-100  %] 100 % (07/28 1559) Weight:  [58.1 kg] 58.1 kg (07/28 1254) Last BM Date:  (pt states more than a week ago) General:   Alert  female in NAD Psych:  Pleasant, cooperative. Normal mood and affect. Eyes:  Pupils equal, sclera clear, no icterus.   Conjunctiva pink. Ears:  Normal auditory acuity. Nose:  No deformity, discharge,  or lesions. Neck:  Supple; no masses Lungs:  Clear throughout to auscultation.   No wheezes, crackles, or rhonchi.  Heart:  Regular rate and rhythm;  no lower extremity edema Abdomen:  Soft, non-distended,  BS active, no palp mass   Rectal:  Deferred  Msk:  Symmetrical without gross deformities. . Neurologic:  Alert and  oriented x4;  grossly normal neurologically. Skin:  Intact without significant lesions or rashes.  Filed Weights   11/24/20 1340 11/25/20 1254  Weight: 58.4 kg 58.1 kg     Scheduled inpatient medications  [MAR Hold] docusate sodium  100 mg Oral Daily   fentaNYL       [MAR Hold] prenatal multivitamin  1 tablet Oral Q1200   [MAR Hold] scopolamine  1 patch Transdermal Q72H   [MAR Hold] thiamine injection  100 mg Intravenous Daily    Intake/Output from previous day: 07/27 0701 - 07/28 0700 In: 3337 [I.V.:1368.5; IV Piggyback:1968.5] Out: 0  Intake/Output this shift: Total I/O In: 1100 [I.V.:1100] Out: 30 [Blood:30]   Lab Results: Recent Labs    11/24/20 1420 11/25/20 0652  WBC 12.0* 8.1  HGB 14.2 10.8*  HCT 41.7 31.4*  PLT 252 170   BMET Recent Labs    11/24/20 1420 11/25/20 0652  NA 131* 132*  K 3.0* 2.6*  CL 98 104  CO2 18* 21*  GLUCOSE 106* 89  BUN 9 11  CREATININE 0.78 0.55  CALCIUM 9.9 8.4*   LFT Recent Labs    11/25/20 0652  PROT 5.5*  ALBUMIN 2.4*  AST 246*  ALT 530*  ALKPHOS 64  BILITOT 4.1*   PT/INR No results for input(s): LABPROT, INR in the last 72 hours. Hepatitis Panel No results for input(s): HEPBSAG, HCVAB, HEPAIGM, HEPBIGM in the last 72 hours.   . CBC Latest Ref Rng & Units  11/25/2020 11/24/2020 11/19/2020  WBC 4.0 - 10.5 K/uL 8.1 12.0(H) 7.4  Hemoglobin 12.0 - 15.0 g/dL 10.8(L) 14.2 12.5  Hematocrit 36.0 - 46.0 % 31.4(L) 41.7 37.0  Platelets 150 - 400 K/uL 170 252 180    . CMP Latest Ref Rng & Units 11/25/2020 11/24/2020 11/19/2020  Glucose 70 - 99 mg/dL 89 035(K) 99  BUN 6 - 20 mg/dL 11 9 7   Creatinine 0.44 -  1.00 mg/dL 0.980.55 1.190.78 1.470.60  Sodium 135 - 145 mmol/L 132(L) 131(L) 136  Potassium 3.5 - 5.1 mmol/L 2.6(LL) 3.0(L) 3.2(L)  Chloride 98 - 111 mmol/L 104 98 102  CO2 22 - 32 mmol/L 21(L) 18(L) 22  Calcium 8.9 - 10.3 mg/dL 8.2(N8.4(L) 9.9 9.2  Total Protein 6.5 - 8.1 g/dL 5.6(O5.5(L) 7.5 6.9  Total Bilirubin 0.3 - 1.2 mg/dL 4.1(H) 4.3(H) 1.3(H)  Alkaline Phos 38 - 126 U/L 64 87 71  AST 15 - 41 U/L 246(H) 362(H) 75(H)  ALT 0 - 44 U/L 530(H) 694(H) 146(H)   Studies/Results: MR ABDOMEN MRCP WO CONTRAST  Result Date: 11/24/2020 CLINICAL DATA:  Abdominal pain, cholelithiasis and intractable emesis. EXAM: MRI ABDOMEN WITHOUT CONTRAST  (INCLUDING MRCP) TECHNIQUE: Multiplanar multisequence MR imaging of the abdomen was performed. Heavily T2-weighted images of the biliary and pancreatic ducts were obtained, and three-dimensional MRCP images were rendered by post processing. COMPARISON:  Ultrasound 11/19/2020 FINDINGS: Lower chest: Lung bases are clear. Normal heart size. No pericardial effusion. Hepatobiliary: Normal intrinsic liver signal. No significant signal dropout on in and out of phase imaging. No visible concerning focal liver lesion. Smooth liver surface contour. No significant gallbladder wall thickening. No pericholecystic fluid or edematous signal. Gradient signal within the gallbladder lumen compatible with some layering biliary sludge. No angular filling defects are identified within the gallbladder lumen or imaged biliary tree. Intra and extrahepatic biliary caliber is within normal limits. Pancreas: No pancreatic ductal dilatation or surrounding inflammatory changes.  Spleen:  Normal in size. No concerning splenic lesions. Adrenals/Urinary Tract: Normal adrenals. No worrisome renal masses. No evidence of hydronephrosis. Stomach/Bowel: Visualized portions within the abdomen are unremarkable. No evidence of bowel obstruction. No worrisome mesenteric findings. Vascular/Lymphatic: No pathologically enlarged lymph nodes identified. No abdominal aortic aneurysm demonstrated. Other: Wide field of view images demonstrated gravid uterus, incompletely assessed on this exam of the abdomen. No visible free intraperitoneal fluid. No bowel containing hernias. Musculoskeletal: Osseous structures are unremarkable with grossly normal marrow signal. Normal cord signal. IMPRESSION: 1. Gradient signal within the gallbladder lumen, compatible with biliary sludge. 2. No discernible angular filling defects to suggest cholelithiasis or choledocholithiasis. 3. No MR evidence of cholecystitis or biliary ductal dilatation. 4. Gravid uterus partially visualized. 5. Otherwise unremarkable abdominal MR. Electronically Signed   By: Kreg ShropshirePrice  DeHay M.D.   On: 11/24/2020 22:15    Active Problems:   [redacted] weeks gestation of pregnancy   Cholelithiasis affecting pregnancy in second trimester, antepartum   Cholelithiasis complicating pregnancy in second trimester, antepartum    Willette Clusteraula Guenther, NP-C @  11/25/2020, 4:10 PM   Attending physician's note   I have taken an interval history, reviewed the chart and examined the patient. I agree with the Advanced Practitioner's note, impression and recommendations.   [redacted] weeks gestation  Acute calculus cholecystitis s/p lap chole today Abn LFTs, neg MRCP for choledocholithiasis or liver abnormalities. No ETOH or FH of liver problems. AST/ALT pattern suggestive of hepatocellular etiology. No meds  Plan: -Trend LFTs.  Fractionate TB, check INR -Viral, metabolic and autoimmune work-up for any etiology. -US with Doppler to r/o hepatic vein thrombosis. -I have  reviewed the op note.  No mention of gross liver abnormalities.  I will get in touch with Dr. Luisa Hartornett tomorrow to make sure. -Avoid hepatotoxic medications. -If LFTs worsen, then only would consider liver biopsy.  Not now.     Edman Circleaj Klyn Kroening, MD [facility] [phone]

## 2020-11-25 NOTE — Op Note (Signed)
Laparoscopic Cholecystectomy    Indications: This patient presents with symptomatic gallbladder disease and will undergo laparoscopic cholecystectomy.  Patient is [redacted] weeks pregnant.  She has had issues with nausea vomiting abdominal pain.  She has been seen multiple times in the last month or so but her pain now is persistent and not improving with pain medication.  On exam she is quite tender and had findings worrisome for acute cholecystitis.  She also had elevated liver function studies but MRCP was normal.  We discussed the pros and cons of surgery at this point but since her symptoms were not resolving as he had before, I was more concerned about acute cholecystitis.  She was [redacted] weeks pregnant so she was added reasonable time to do cholecystectomy given her findings I felt the benefit of surgery outweighed the risk of fetal loss and maternal complication.  Risk and benefits were discussed.  She agreed to proceed with cholecystectomy.The procedure has been discussed with the patient. Operative and non operative treatments have been discussed. Risks of surgery include bleeding, infection,  Common bile duct injury,  Injury to the stomach,liver, colon,small intestine, abdominal wall,  Diaphragm,  Major blood vessels,  And the need for an open procedure.  Other risks include worsening of medical problems, death,  DVT and pulmonary embolism, and cardiovascular events.   Medical options have also been discussed. The patient has been informed of long term expectations of surgery and non surgical options,  The patient agrees to proceed.     Pre-operative Diagnosis: Calculus of gallbladder with acute cholecystitis, without mention of obstruction  Post-operative Diagnosis: Same  Surgeon: Clovis Pu Zoriana Oats   Assistants: Barnetta Chapel, PA  Anesthesia: General endotracheal anesthesia and Local anesthesia 0.25.% bupivacaine  ASA Class: 1  Procedure Details  The patient was seen again in the Holding Room. The  risks, benefits, complications, treatment options, and expected outcomes were discussed with the patient. The possibilities of reaction to medication, pulmonary aspiration, perforation of viscus, bleeding, recurrent infection, finding a normal gallbladder, the need for additional procedures, failure to diagnose a condition, the possible need to convert to an open procedure, and creating a complication requiring transfusion or operation were discussed with the patient. The patient and/or family concurred with the proposed plan, giving informed consent. The site of surgery properly noted/marked. The patient was taken to Operating Room, identified as Deborah Evans and the procedure verified as Laparoscopic Cholecystectomy with Intraoperative Cholangiograms. A Time Out was held and the above information confirmed.  Prior to the induction of general anesthesia, antibiotic prophylaxis was administered. General endotracheal anesthesia was then administered and tolerated well. After the induction, the abdomen was prepped in the usual sterile fashion. The patient was positioned in the supine position with the left arm comfortably tucked, along with some reverse Trendelenburg.  Local anesthetic agent was injected into the skin near the umbilicus and an incision made. The midline fascia was incised and the Hasson technique was used to introduce a 12 mm port under direct vision. It was secured with a figure of eight Vicryl suture placed in the usual fashion. Pneumoperitoneum was then created with CO2 and tolerated well without any adverse changes in the patient's vital signs. Additional trocars were introduced under direct vision with an 11 mm trocar in the epigastrium and 2 5 mm trocars in the right upper quadrant. All skin incisions were infiltrated with a local anesthetic agent before making the incision and placing the trocars.   The gallbladder was identified, the  fundus grasped and retracted cephalad. Adhesions  were lysed bluntly and with the electrocautery where indicated, taking care not to injure any adjacent organs or viscus. The infundibulum was grasped and retracted laterally, exposing the peritoneum overlying the triangle of Calot. This was then divided and exposed in a blunt fashion. The cystic duct was clearly identified and bluntly dissected circumferentially. The junctions of the gallbladder, cystic duct and common bile duct were clearly identified prior to the division of any linear structure.   Due to her pregnant state, cholangiogram was admitted.  She had a normal MRCP.  She did it did have elevated liver functions this was felt to be secondary to her pregnancy.   The cystic duct was then  ligated with surgical clips  on the patient side and  clipped on the gallbladder side and divided. The cystic artery was identified, dissected free, ligated with clips and divided as well. Posterior cystic artery clipped and divided.  The gallbladder was dissected from the liver bed in retrograde fashion with the electrocautery. The gallbladder was removed.  He was placed into an Endo Catch bag.  the liver bed was irrigated and inspected. Hemostasis was achieved with the electrocautery. Copious irrigation was utilized and was repeatedly aspirated until clear all particulate matter. Hemostasis was achieved with no signs  Of bleeding or bile leakage.  The uterus is identified and at expected size given 14 weeks pregnancy.  No evidence of uterine injury.  No evidence of small bowel injury or any associated injury from placement of trochars with the procedure.  Pneumoperitoneum was completely reduced after viewing removal of the trocars under direct vision. The wound was thoroughly irrigated and the fascia was then closed with a figure of eight suture; the skin was then closed with 4-0 Monocryl and a sterile of Dermabond dressing was applied.  Instrument, sponge, and needle counts were correct at closure and at  the conclusion of the case.   Findings: Cholecystitis with Cholelithiasis  Estimated Blood Loss: less than 50 mL         Drains: None         Total IV Fluids: Per anesthesia record         Specimens: Gallbladder           Complications: None; patient tolerated the procedure well.         Disposition: PACU - hemodynamically stable.         Condition: stable

## 2020-11-25 NOTE — Anesthesia Preprocedure Evaluation (Addendum)
Anesthesia Evaluation  Patient identified by MRN, date of birth, ID band Patient awake    Reviewed: Allergy & Precautions, NPO status , Patient's Chart, lab work & pertinent test results  Airway Mallampati: II  TM Distance: >3 FB Neck ROM: Full    Dental no notable dental hx.    Pulmonary neg pulmonary ROS,    Pulmonary exam normal breath sounds clear to auscultation       Cardiovascular negative cardio ROS Normal cardiovascular exam Rhythm:Regular Rate:Normal     Neuro/Psych negative neurological ROS  negative psych ROS   GI/Hepatic Neg liver ROS, Cholecystitis Hyperemesis gravidarum   Endo/Other  Hypokalemia 2/2 hyperemesis  Renal/GU negative Renal ROS  negative genitourinary   Musculoskeletal negative musculoskeletal ROS (+)   Abdominal   Peds negative pediatric ROS (+)  Hematology negative hematology ROS (+)   Anesthesia Other Findings   Reproductive/Obstetrics (+) Pregnancy                          Anesthesia Physical Anesthesia Plan  ASA: 2 and emergent  Anesthesia Plan: General   Post-op Pain Management:    Induction: Intravenous  PONV Risk Score and Plan: Treatment may vary due to age or medical condition, Dexamethasone, Ondansetron and Scopolamine patch - Pre-op  Airway Management Planned: Oral ETT  Additional Equipment: None  Intra-op Plan:   Post-operative Plan: Extubation in OR  Informed Consent: I have reviewed the patients History and Physical, chart, labs and discussed the procedure including the risks, benefits and alternatives for the proposed anesthesia with the patient or authorized representative who has indicated his/her understanding and acceptance.     Dental advisory given  Plan Discussed with: CRNA and Anesthesiologist  Anesthesia Plan Comments:        Anesthesia Quick Evaluation

## 2020-11-25 NOTE — Transfer of Care (Signed)
Immediate Anesthesia Transfer of Care Note  Patient: Deborah Evans  Procedure(s) Performed: LAPAROSCOPIC CHOLECYSTECTOMY  Patient Location: PACU  Anesthesia Type:General  Level of Consciousness: awake and drowsy  Airway & Oxygen Therapy: Patient Spontanous Breathing and Patient connected to face mask  Post-op Assessment: Report given to RN and Post -op Vital signs reviewed and stable  Post vital signs: Reviewed and stable  Last Vitals:  Vitals Value Taken Time  BP 119/81 11/25/20 1459  Temp    Pulse 109 11/25/20 1502  Resp 21 11/25/20 1502  SpO2 100 % 11/25/20 1502  Vitals shown include unvalidated device data.  Last Pain:  Vitals:   11/25/20 1253  TempSrc:   PainSc: 0-No pain      Patients Stated Pain Goal: 4 (11/24/20 1811)  Complications: No notable events documented.

## 2020-11-25 NOTE — Progress Notes (Signed)
Progress Note     Subjective: Patient still having abdominal pain and nausea.   Objective: Vital signs in last 24 hours: Temp:  [98.2 F (36.8 C)-98.9 F (37.2 C)] 98.7 F (37.1 C) (07/28 0750) Pulse Rate:  [98-125] 101 (07/28 0750) Resp:  [18-20] 20 (07/28 0750) BP: (113-141)/(76-92) 141/92 (07/28 0750) SpO2:  [99 %-100 %] 100 % (07/28 0750) Weight:  [58.4 kg] 58.4 kg (07/27 1340) Last BM Date:  (pt states more than a week ago)  Intake/Output from previous day: 07/27 0701 - 07/28 0700 In: 3337 [I.V.:1368.5; IV Piggyback:1968.5] Out: 0  Intake/Output this shift: No intake/output data recorded.  PE: General: pleasant, WD/WN AA female who is laying in bed in NAD HEENT: head is normocephalic, atraumatic.  Sclera are anicteric.  PERRL.  Ears and nose without any masses or lesions.  Mouth is pink and moist. Dentition fair Heart: regular, rate, and rhythm.  Normal s1,s2. No obvious murmurs, gallops, or rubs noted.  Palpable pedal pulses bilaterally Lungs: CTAB, no wheezes, rhonchi, or rales noted.  Respiratory effort nonlabored Abd:  Soft, gravid abdomen that is ND and tender over the RUQ and epigastrium, +BS, no masses, hernias, or organomegaly MS: no BUE/BLE edema, calves soft and nontender Skin: warm and dry with no masses, lesions, or rashes Psych: A&Ox4 with an appropriate affect Neuro: cranial nerves grossly intact, equal strength in BUE/BLE bilaterally, normal speech, thought process intact, moves all extremities, gait not assessed   Lab Results:  Recent Labs    11/24/20 1420 11/25/20 0652  WBC 12.0* 8.1  HGB 14.2 10.8*  HCT 41.7 31.4*  PLT 252 170   BMET Recent Labs    11/24/20 1420 11/25/20 0652  NA 131* 132*  K 3.0* 2.6*  CL 98 104  CO2 18* 21*  GLUCOSE 106* 89  BUN 9 11  CREATININE 0.78 0.55  CALCIUM 9.9 8.4*   PT/INR No results for input(s): LABPROT, INR in the last 72 hours. CMP     Component Value Date/Time   NA 132 (L) 11/25/2020 0652    K 2.6 (LL) 11/25/2020 0652   CL 104 11/25/2020 0652   CO2 21 (L) 11/25/2020 0652   GLUCOSE 89 11/25/2020 0652   BUN 11 11/25/2020 0652   CREATININE 0.55 11/25/2020 0652   CALCIUM 8.4 (L) 11/25/2020 0652   PROT 5.5 (L) 11/25/2020 0652   ALBUMIN 2.4 (L) 11/25/2020 0652   AST 246 (H) 11/25/2020 0652   ALT 530 (H) 11/25/2020 0652   ALKPHOS 64 11/25/2020 0652   BILITOT 4.1 (H) 11/25/2020 0652   GFRNONAA >60 11/25/2020 0652   GFRAA >60 04/11/2018 1122   Lipase     Component Value Date/Time   LIPASE 85 (H) 11/24/2020 1420       Studies/Results: MR ABDOMEN MRCP WO CONTRAST  Result Date: 11/24/2020 CLINICAL DATA:  Abdominal pain, cholelithiasis and intractable emesis. EXAM: MRI ABDOMEN WITHOUT CONTRAST  (INCLUDING MRCP) TECHNIQUE: Multiplanar multisequence MR imaging of the abdomen was performed. Heavily T2-weighted images of the biliary and pancreatic ducts were obtained, and three-dimensional MRCP images were rendered by post processing. COMPARISON:  Ultrasound 11/19/2020 FINDINGS: Lower chest: Lung bases are clear. Normal heart size. No pericardial effusion. Hepatobiliary: Normal intrinsic liver signal. No significant signal dropout on in and out of phase imaging. No visible concerning focal liver lesion. Smooth liver surface contour. No significant gallbladder wall thickening. No pericholecystic fluid or edematous signal. Gradient signal within the gallbladder lumen compatible with some layering biliary sludge. No angular  filling defects are identified within the gallbladder lumen or imaged biliary tree. Intra and extrahepatic biliary caliber is within normal limits. Pancreas: No pancreatic ductal dilatation or surrounding inflammatory changes. Spleen:  Normal in size. No concerning splenic lesions. Adrenals/Urinary Tract: Normal adrenals. No worrisome renal masses. No evidence of hydronephrosis. Stomach/Bowel: Visualized portions within the abdomen are unremarkable. No evidence of bowel  obstruction. No worrisome mesenteric findings. Vascular/Lymphatic: No pathologically enlarged lymph nodes identified. No abdominal aortic aneurysm demonstrated. Other: Wide field of view images demonstrated gravid uterus, incompletely assessed on this exam of the abdomen. No visible free intraperitoneal fluid. No bowel containing hernias. Musculoskeletal: Osseous structures are unremarkable with grossly normal marrow signal. Normal cord signal. IMPRESSION: 1. Gradient signal within the gallbladder lumen, compatible with biliary sludge. 2. No discernible angular filling defects to suggest cholelithiasis or choledocholithiasis. 3. No MR evidence of cholecystitis or biliary ductal dilatation. 4. Gravid uterus partially visualized. 5. Otherwise unremarkable abdominal MR. Electronically Signed   By: Kreg Shropshire M.D.   On: 11/24/2020 22:15    Anti-infectives: Anti-infectives (From admission, onward)    None        Assessment/Plan Cholelithiasis with elevated LFT's - AST/ALT down some, Tbili is 4.1 from 4.3 - MRCP negative last night - patient is not a great candidate for intraoperative cholangiogram given that she is [redacted] weeks pregnant - will ask GI to see as well - timing of lap chole TBD Hypokalemia - K 2.6 this AM, ordered IV replacement   FEN - NPO, IVF @125  cc/h VTE - SCDs ID - No current abx  LOS: 1 day    , [facility] 11/25/2020, 8:05 AM Please see Amion for pager number during day hours 7:00am-4:30pm

## 2020-11-25 NOTE — Discharge Instructions (Signed)
[facility]  Please arrive at least 30 min before your appointment to complete your check in paperwork.  If you are unable to arrive 30 min prior to your appointment time we may have to cancel or reschedule you. LAPAROSCOPIC SURGERY: POST OP INSTRUCTIONS Always review your discharge instruction sheet given to you by the facility where your surgery was performed. IF YOU HAVE DISABILITY OR FAMILY LEAVE FORMS, YOU MUST BRING THEM TO THE OFFICE FOR PROCESSING.   DO NOT GIVE THEM TO YOUR DOCTOR.  PAIN CONTROL  First take acetaminophen (Tylenol) AND/or ibuprofen (Advil) to control your pain after surgery.  Follow directions on package.  Taking acetaminophen (Tylenol) and/or ibuprofen (Advil) regularly after surgery will help to control your pain and lower the amount of prescription pain medication you may need.  You should not take more than 4,000 mg (4 grams) of acetaminophen (Tylenol) in 24 hours.  You should not take ibuprofen (Advil), aleve, motrin, naprosyn or other NSAIDS if you have a history of stomach ulcers or chronic kidney disease.  A prescription for pain medication may be given to you upon discharge.  Take your pain medication as prescribed, if you still have uncontrolled pain after taking acetaminophen (Tylenol) or ibuprofen (Advil). Use ice packs to help control pain. If you need a refill on your pain medication, please contact your pharmacy.  They will contact our office to request authorization. Prescriptions will not be filled after 5pm or on week-ends.  HOME MEDICATIONS Take your usually prescribed medications unless otherwise directed.  DIET You should follow a light diet the first few days after arrival home.  Be sure to include lots of fluids daily. Avoid fatty, fried foods.   CONSTIPATION It is common to experience some constipation after surgery and if you are taking pain medication.  Increasing fluid intake and taking a stool softener (such as Colace)  will usually help or prevent this problem from occurring.  A mild laxative (Milk of Magnesia or Miralax) should be taken according to package instructions if there are no bowel movements after 48 hours.  WOUND/INCISION CARE Most patients will experience some swelling and bruising in the area of the incisions.  Ice packs will help.  Swelling and bruising can take several days to resolve.  Unless discharge instructions indicate otherwise, follow guidelines below  STERI-STRIPS - you may remove your outer bandages 48 hours after surgery, and you may shower at that time.  You have steri-strips (small skin tapes) in place directly over the incision.  These strips should be left on the skin for 7-10 days.   DERMABOND/SKIN GLUE - you may shower in 24 hours.  The glue will flake off over the next 2-3 weeks. Any sutures or staples will be removed at the office during your follow-up visit.  ACTIVITIES You may resume regular (light) daily activities beginning the next day--such as daily self-care, walking, climbing stairs--gradually increasing activities as tolerated.  You may have sexual intercourse when it is comfortable.  Refrain from any heavy lifting or straining until approved by your doctor. You may drive when you are no longer taking prescription pain medication, you can comfortably wear a seatbelt, and you can safely maneuver your car and apply brakes.  FOLLOW-UP You should see your doctor in the office for a follow-up appointment approximately 2-3 weeks after your surgery.  You should have been given your post-op/follow-up appointment when your surgery was scheduled.  If you did not receive a post-op/follow-up appointment, make sure   that you call for this appointment within a day or two after you arrive home to insure a convenient appointment time.   WHEN TO CALL YOUR DOCTOR: Fever over 101.0 Inability to urinate Continued bleeding from incision. Increased pain, redness, or drainage from the  incision. Increasing abdominal pain  The clinic staff is available to answer your questions during regular business hours.  Please don't hesitate to call and ask to speak to one of the nurses for clinical concerns.  If you have a medical emergency, go to the nearest emergency room or call 911.  A surgeon from [facility] is always on call at the hospital. [address], [address], [place], [place]  [address] ? [address] [address], [place], [place]   [address] [phone] ? [phone] ? FAX [phone]  

## 2020-11-25 NOTE — Progress Notes (Signed)
Morphine 2mg /ml retrieved via secure tube from pharmacy. Medication handed off to RN, oncoming nurse for this patient.

## 2020-11-25 NOTE — Progress Notes (Signed)
FACULTY PRACTICE ANTEPARTUM PROGRESS NOTE  Deborah Evans is a 25 y.o. G3P1011 at [redacted]w[redacted]d who is admitted for admoinal pain and worsening liver functions.  Estimated Date of Delivery: 05/17/21 Fetal presentation is unsure.  Length of Stay:  0 Days. Admitted 11/24/2020  Subjective: Pt seen just prior to cholecystectomy.  She noted continued nausea and epigastric pain.  Pt noted surgery team had decided on procedure for definitive resolution of symptoms.  She denies uterine contractions, denies bleeding and leaking of fluid per vagina.  Vitals:  Blood pressure 119/81, pulse (!) 106, temperature 98 F (36.7 C), resp. rate 16, height 5\' 8"  (1.727 m), weight 58.1 kg, last menstrual period 08/10/2020, SpO2 100 %, unknown if currently breastfeeding. Physical Examination: CONSTITUTIONAL: Well-developed, well-nourished female in no acute distress.  HENT:  Normocephalic, atraumatic, External right and left ear normal. Oropharynx is clear and moist EYES: Conjunctivae and EOM are normal.  NECK: Normal range of motion, supple, no masses. SKIN: Skin is warm and dry. No rash noted. Not diaphoretic. No erythema. No pallor. NEUROLGIC: Alert and oriented to person, place, and time. Normal reflexes, muscle tone coordination. No cranial nerve deficit noted. PSYCHIATRIC: Normal mood and affect. Normal behavior. Normal judgment and thought content. CARDIOVASCULAR: Normal heart rate noted, regular rhythm RESPIRATORY: Effort and breath sounds normal, no problems with respiration noted MUSCULOSKELETAL: Normal range of motion. No edema and no tenderness. ABDOMEN: Soft, tender in epigastric region, nondistended, gravid. CERVIX: deferred   Results for orders placed or performed during the hospital encounter of 11/24/20 (from the past 48 hour(s))  Urinalysis, Routine w reflex microscopic Urine, Clean Catch     Status: Abnormal   Collection Time: 11/24/20  2:20 PM  Result Value Ref Range   Color, Urine AMBER (A)  YELLOW    Comment: BIOCHEMICALS MAY BE AFFECTED BY COLOR   APPearance HAZY (A) CLEAR   Specific Gravity, Urine 1.024 1.005 - 1.030   pH 6.0 5.0 - 8.0   Glucose, UA NEGATIVE NEGATIVE mg/dL   Hgb urine dipstick NEGATIVE NEGATIVE   Bilirubin Urine SMALL (A) NEGATIVE   Ketones, ur 80 (A) NEGATIVE mg/dL   Protein, ur 11/26/20 (A) NEGATIVE mg/dL   Nitrite NEGATIVE NEGATIVE   Leukocytes,Ua TRACE (A) NEGATIVE   RBC / HPF 0-5 0 - 5 RBC/hpf   WBC, UA 11-20 0 - 5 WBC/hpf   Bacteria, UA RARE (A) NONE SEEN   Squamous Epithelial / LPF 0-5 0 - 5   Mucus PRESENT    Hyaline Casts, UA PRESENT    Granular Casts, UA PRESENT     Comment: Performed at [facility], [address] [place], [place]  CBC     Status: Abnormal   Collection Time: 11/24/20  2:20 PM  Result Value Ref Range   WBC 12.0 (H) 4.0 - 10.5 K/uL   RBC 5.41 (H) 3.87 - 5.11 MIL/uL   Hemoglobin 14.2 12.0 - 15.0 g/dL   HCT 11/26/20 05.3 - 97.6 %   MCV 77.1 (L) 80.0 - 100.0 fL   MCH 26.2 26.0 - 34.0 pg   MCHC 34.1 30.0 - 36.0 g/dL   RDW 73.4 19.3 - 79.0 %   Platelets 252 150 - 400 K/uL   nRBC 0.0 0.0 - 0.2 %    Comment: Performed at [facility], [address], [place], [place]  Comprehensive metabolic panel     Status: Abnormal   Collection Time: 11/24/20  2:20 PM  Result Value Ref  Range   Sodium 131 (L) 135 - 145 mmol/L   Potassium 3.0 (L) 3.5 - 5.1 mmol/L   Chloride 98 98 - 111 mmol/L   CO2 18 (L) 22 - 32 mmol/L   Glucose, Bld 106 (H) 70 - 99 mg/dL    Comment: Glucose reference range applies only to samples taken after fasting for at least 8 hours.   BUN 9 6 - 20 mg/dL   Creatinine, Ser 2.26 0.44 - 1.00 mg/dL   Calcium 9.9 8.9 - 33.3 mg/dL   Total Protein 7.5 6.5 - 8.1 g/dL   Albumin 3.4 (L) 3.5 - 5.0 g/dL   AST 545 (H) 15 - 41 U/L   ALT 694 (H) 0 - 44 U/L   Alkaline Phosphatase 87 38 - 126 U/L   Total Bilirubin 4.3 (H) 0.3 - 1.2 mg/dL   GFR, Estimated >62 >56 mL/min    Comment:  (NOTE) Calculated using the CKD-EPI Creatinine Equation (2021)    Anion gap 15 5 - 15    Comment: Performed at [facility], [address] [place], [place] [address]  Lipase, blood     Status: Abnormal   Collection Time: 11/24/20  2:20 PM  Result Value Ref Range   Lipase 85 (H) 11 - 51 U/L    Comment: Performed at [facility], [address], [place], [place] [address]  Type and screen [facility]     Status: None   Collection Time: 11/24/20  4:45 PM  Result Value Ref Range   ABO/RH(D) O POS    Antibody Screen NEG    Sample Expiration      11/27/2020,2359 Performed at [facility], [address], [place], [place] [address]   Resp Panel by RT-PCR (Flu A&B, Covid) Nasopharyngeal Swab     Status: None   Collection Time: 11/24/20  4:45 PM   Specimen: Nasopharyngeal Swab; Nasopharyngeal(NP) swabs in vial transport medium  Result Value Ref Range   SARS Coronavirus 2 by RT PCR NEGATIVE NEGATIVE    Comment: (NOTE) SARS-CoV-2 target nucleic acids are NOT DETECTED.  The SARS-CoV-2 RNA is generally detectable in upper respiratory specimens during the acute phase of infection. The lowest concentration of SARS-CoV-2 viral copies this assay can detect is 138 copies/mL. A negative result does not preclude SARS-Cov-2 infection and should not be used as the sole basis for treatment or other patient management decisions. A negative result may occur with  improper specimen collection/handling, submission of specimen other than nasopharyngeal swab, presence of viral mutation(s) within the areas targeted by this assay, and inadequate number of viral copies(<138 copies/mL). A negative result must be combined with clinical observations, patient history, and epidemiological information. The expected result is Negative.  Fact Sheet for Patients:  BloggerCourse.com  Fact Sheet for Healthcare Providers:   SeriousBroker.it  This test is no t yet approved or cleared by the [place] FDA and  has been authorized for detection and/or diagnosis of SARS-CoV-2 by FDA under an Emergency Use Authorization (EUA). This EUA will remain  in effect (meaning this test can be used) for the duration of the COVID-19 declaration under Section 564(b)(1) of the Act, 21 U.S.C.section 360bbb-3(b)(1), unless the authorization is terminated  or revoked sooner.       Influenza A by PCR NEGATIVE NEGATIVE   Influenza B by PCR NEGATIVE NEGATIVE    Comment: (NOTE) The Xpert Xpress SARS-CoV-2/FLU/RSV plus assay is intended as an aid in the diagnosis  of influenza from Nasopharyngeal swab specimens and should not be used as a sole basis for treatment. Nasal washings and aspirates are unacceptable for Xpert Xpress SARS-CoV-2/FLU/RSV testing.  Fact Sheet for Patients: BloggerCourse.com  Fact Sheet for Healthcare Providers: SeriousBroker.it  This test is not yet approved or cleared by the [place] FDA and has been authorized for detection and/or diagnosis of SARS-CoV-2 by FDA under an Emergency Use Authorization (EUA). This EUA will remain in effect (meaning this test can be used) for the duration of the COVID-19 declaration under Section 564(b)(1) of the Act, 21 U.S.C. section 360bbb-3(b)(1), unless the authorization is terminated or revoked.  Performed at [facility], [address] [place], [place] [address]   CBC     Status: Abnormal   Collection Time: 11/25/20  6:52 AM  Result Value Ref Range   WBC 8.1 4.0 - 10.5 K/uL   RBC 4.07 3.87 - 5.11 MIL/uL   Hemoglobin 10.8 (L) 12.0 - 15.0 g/dL   HCT 53.6 (L) 64.4 - 03.4 %   MCV 77.1 (L) 80.0 - 100.0 fL   MCH 26.5 26.0 - 34.0 pg   MCHC 34.4 30.0 - 36.0 g/dL   RDW 74.2 59.5 - 63.8 %   Platelets 170 150 - 400 K/uL   nRBC 0.0 0.0 - 0.2 %    Comment: Performed at [facility], [address] [place], [place] [address]  Comprehensive metabolic panel     Status: Abnormal   Collection Time: 11/25/20  6:52 AM  Result Value Ref Range   Sodium 132 (L) 135 - 145 mmol/L   Potassium 2.6 (LL) 3.5 - 5.1 mmol/L    Comment: CRITICAL RESULT CALLED TO, READ BACK BY AND VERIFIED WITH: LAWSON,P RN @ [phone] 11/25/20 LEONARD,A    Chloride 104 98 - 111 mmol/L   CO2 21 (L) 22 - 32 mmol/L   Glucose, Bld 89 70 - 99 mg/dL    Comment: Glucose reference range applies only to samples taken after fasting for at least 8 hours.   BUN 11 6 - 20 mg/dL   Creatinine, Ser 1.88 0.44 - 1.00 mg/dL   Calcium 8.4 (L) 8.9 - 10.3 mg/dL   Total Protein 5.5 (L) 6.5 - 8.1 g/dL   Albumin 2.4 (L) 3.5 - 5.0 g/dL   AST 416 (H) 15 - 41 U/L   ALT 530 (H) 0 - 44 U/L   Alkaline Phosphatase 64 38 - 126 U/L   Total Bilirubin 4.1 (H) 0.3 - 1.2 mg/dL   GFR, Estimated >60 >63 mL/min    Comment: (NOTE) Calculated using the CKD-EPI Creatinine Equation (2021)    Anion gap 7 5 - 15    Comment: Performed at [facility], [address], [place], [place] [address]    I have reviewed the patient's current medications.  ASSESSMENT: Active Problems:   [redacted] weeks gestation of pregnancy   Cholelithiasis affecting pregnancy in second trimester, antepartum   Cholelithiasis complicating pregnancy in second trimester, antepartum   PLAN: Pt now s/p cholecystectomy Will reassess pain after procedure Check LFT and lipase in AM FHT upon arrival to floor post procedure   Continue routine antenatal care.   Mariel Aloe, MD [facility] Faculty Attending, Center for [facility] 11/25/2020 3:27 PM

## 2020-11-25 NOTE — Anesthesia Procedure Notes (Signed)
Procedure Name: Intubation Date/Time: 11/25/2020 1:41 PM Performed by: Mariea Clonts, CRNA Pre-anesthesia Checklist: Patient identified, Emergency Drugs available, Suction available and Patient being monitored Patient Re-evaluated:Patient Re-evaluated prior to induction Oxygen Delivery Method: Circle system utilized Preoxygenation: Pre-oxygenation with 100% oxygen Induction Type: IV induction Laryngoscope Size: Mac and 4 Grade View: Grade I Tube type: Oral Tube size: 7.0 mm Number of attempts: 1 Airway Equipment and Method: Stylet Placement Confirmation: ETT inserted through vocal cords under direct vision, positive ETCO2 and breath sounds checked- equal and bilateral Secured at: 22 cm Tube secured with: Tape Dental Injury: Teeth and Oropharynx as per pre-operative assessment  Comments: Intubated by Everlene Other SRNA

## 2020-11-25 NOTE — Interval H&P Note (Signed)
History and Physical Interval Note:  11/25/2020 1:10 PM  Deborah Evans  has presented today for surgery, with the diagnosis of cholecystitis.  The various methods of treatment have been discussed with the patient and family. After consideration of risks, benefits and other options for treatment, the patient has consented to  Procedure(s): LAPAROSCOPIC CHOLECYSTECTOMY (N/A) as a surgical intervention.  The patient's history has been reviewed, patient examined, no change in status, stable for surgery.  I have reviewed the patient's chart and labs.  Questions were answered to the patient's satisfaction.    Lap chole today  No IOC due to pregnancy Elevated LFT but normal ERCP  More than likely secondary to pregnancy   Small risk of fetal loss but symptoms and progressive disease carries greater risk of miscarrage   The procedure has been discussed with the patient. Operative and non operative treatments have been discussed. Risks of surgery include bleeding, infection,  Common bile duct injury,  Injury to the stomach,liver, colon,small intestine, abdominal wall,  Diaphragm,  Major blood vessels,  And the need for an open procedure.  Other risks include worsening of medical problems, death,  DVT and pulmonary embolism, and cardiovascular events.   Medical options have also been discussed. The patient has been informed of long term expectations of surgery and non surgical options,  The patient agrees to proceed.    Eames Dibiasio A Kashvi Prevette

## 2020-11-26 ENCOUNTER — Encounter (HOSPITAL_COMMUNITY): Payer: Self-pay | Admitting: Surgery

## 2020-11-26 DIAGNOSIS — R7989 Other specified abnormal findings of blood chemistry: Secondary | ICD-10-CM

## 2020-11-26 DIAGNOSIS — O26612 Liver and biliary tract disorders in pregnancy, second trimester: Secondary | ICD-10-CM | POA: Diagnosis not present

## 2020-11-26 DIAGNOSIS — Z3A15 15 weeks gestation of pregnancy: Secondary | ICD-10-CM | POA: Diagnosis not present

## 2020-11-26 DIAGNOSIS — K8020 Calculus of gallbladder without cholecystitis without obstruction: Secondary | ICD-10-CM | POA: Diagnosis not present

## 2020-11-26 DIAGNOSIS — R945 Abnormal results of liver function studies: Secondary | ICD-10-CM | POA: Diagnosis not present

## 2020-11-26 LAB — MAGNESIUM: Magnesium: 1.4 mg/dL — ABNORMAL LOW (ref 1.7–2.4)

## 2020-11-26 LAB — COMPREHENSIVE METABOLIC PANEL
ALT: 383 U/L — ABNORMAL HIGH (ref 0–44)
AST: 202 U/L — ABNORMAL HIGH (ref 15–41)
Albumin: 2 g/dL — ABNORMAL LOW (ref 3.5–5.0)
Alkaline Phosphatase: 51 U/L (ref 38–126)
Anion gap: 8 (ref 5–15)
BUN: 8 mg/dL (ref 6–20)
CO2: 20 mmol/L — ABNORMAL LOW (ref 22–32)
Calcium: 8 mg/dL — ABNORMAL LOW (ref 8.9–10.3)
Chloride: 105 mmol/L (ref 98–111)
Creatinine, Ser: 0.44 mg/dL (ref 0.44–1.00)
GFR, Estimated: >60 mL/min (ref >60)
Glucose, Bld: 82 mg/dL (ref 70–99)
Potassium: 2.8 mmol/L — ABNORMAL LOW (ref 3.5–5.1)
Sodium: 133 mmol/L — ABNORMAL LOW (ref 135–145)
Total Bilirubin: 2.8 mg/dL — ABNORMAL HIGH (ref 0.3–1.2)
Total Protein: 4.4 g/dL — ABNORMAL LOW (ref 6.5–8.1)

## 2020-11-26 LAB — CBC
HCT: 26.6 % — ABNORMAL LOW (ref 36.0–46.0)
Hemoglobin: 9.2 g/dL — ABNORMAL LOW (ref 12.0–15.0)
MCH: 27.3 pg (ref 26.0–34.0)
MCHC: 34.6 g/dL (ref 30.0–36.0)
MCV: 78.9 fL — ABNORMAL LOW (ref 80.0–100.0)
Platelets: 120 10*3/uL — ABNORMAL LOW (ref 150–400)
RBC: 3.37 MIL/uL — ABNORMAL LOW (ref 3.87–5.11)
RDW: 14.6 % (ref 11.5–15.5)
WBC: 7.1 10*3/uL (ref 4.0–10.5)
nRBC: 0 % (ref 0.0–0.2)

## 2020-11-26 LAB — IGG: IgG (Immunoglobin G), Serum: 667 mg/dL (ref 586–1602)

## 2020-11-26 LAB — PROTIME-INR
INR: 1.2 (ref 0.8–1.2)
Prothrombin Time: 15 seconds (ref 11.4–15.2)

## 2020-11-26 LAB — HSV(HERPES SIMPLEX VRS) I + II AB-IGM: HSVI/II Comb IgM: <0.91 Ratio (ref 0.00–0.90)

## 2020-11-26 LAB — CERULOPLASMIN: Ceruloplasmin: 49.7 mg/dL — ABNORMAL HIGH (ref 19.0–39.0)

## 2020-11-26 LAB — LIPASE, BLOOD: Lipase: 62 U/L — ABNORMAL HIGH (ref 11–51)

## 2020-11-26 LAB — CMV IGM: CMV IgM: <30 AU/mL (ref 0.0–29.9)

## 2020-11-26 MED ORDER — MAGNESIUM SULFATE 2 GM/50ML IV SOLN
2.0000 g | Freq: Once | INTRAVENOUS | Status: AC
  Administered 2020-11-26: 2 g via INTRAVENOUS
  Filled 2020-11-26: qty 50

## 2020-11-26 MED ORDER — POTASSIUM CHLORIDE 10 MEQ/100ML IV SOLN
10.0000 meq | INTRAVENOUS | Status: AC
  Administered 2020-11-26 (×6): 10 meq via INTRAVENOUS
  Filled 2020-11-26 (×6): qty 100

## 2020-11-26 NOTE — Progress Notes (Signed)
Progress Note    ASSESSMENT AND PLAN:   Abn LFTs.  Improving. Neg US Doppler, neg work-up so far. Nl PT INR S/P lap chole 7/28 [redacted] weeks gestation  Plan: -Continue supportive treatment -Would add HFE (since iron sat was elevated) -Trend LFTs. -Would sign off for now.  Please call if with any questions or change in clinical status.  I believe LFTs would continue to improve. -FU in GI clinic as outpt     SUBJECTIVE   Still having abdominal pain related to Sx. surgery is following. Otherwise doing well. LFTs are improving.    OBJECTIVE:     Vital signs in last 24 hours: Temp:  [98 F (36.7 C)-99.4 F (37.4 C)] 99.4 F (37.4 C) (07/29 1611) Pulse Rate:  [93-113] 103 (07/29 1611) Resp:  [15-21] 18 (07/29 1611) BP: (137-149)/(71-94) 149/91 (07/29 1611) SpO2:  [98 %-100 %] 99 % (07/29 1611) Last BM Date:  (not sure) General:   Alert,  EENT:  Normal hearing, non icteric sclera, conjunctive pink.  Heart:  Regular rate and rhythm; no murmur.  No lower extremity edema   Pulm: Normal respiratory effort, lungs CTA bilaterally without wheezes or crackles. Abdomen:  Soft, tender (expected) normal bowel sounds,.       Neurologic:  Alert and  oriented x4;  grossly normal neurologically. Psych:  Pleasant, cooperative.   Intake/Output from previous day: 07/28 0701 - 07/29 0700 In: 1100 [I.V.:1100] Out: 530 [Urine:500; Blood:30] Intake/Output this shift: Total I/O In: 2659.6 [P.O.:240; I.V.:1953; IV Piggyback:466.6] Out: 1051 [Urine:500; Emesis/NG output:551]  Lab Results: Recent Labs    11/24/20 1420 11/25/20 0652 11/26/20 0538  WBC 12.0* 8.1 7.1  HGB 14.2 10.8* 9.2*  HCT 41.7 31.4* 26.6*  PLT 252 170 120*   BMET Recent Labs    11/24/20 1420 11/25/20 0652 11/26/20 0538  NA 131* 132* 133*  K 3.0* 2.6* 2.8*  CL 98 104 105  CO2 18* 21* 20*  GLUCOSE 106* 89 82  BUN 9 11 8   CREATININE 0.78 0.55 0.44  CALCIUM 9.9 8.4* 8.0*   LFT Recent Labs     11/25/20 1736 11/26/20 0538  PROT 5.1* 4.4*  ALBUMIN 2.4* 2.0*  AST 261* 202*  ALT 507* 383*  ALKPHOS 60 51  BILITOT 3.4* 2.8*  BILIDIR 1.4*  --   IBILI 2.0*  --    PT/INR Recent Labs    11/26/20 0538  LABPROT 15.0  INR 1.2   Hepatitis Panel Recent Labs    11/25/20 1736  HEPBSAG NON REACTIVE  HCVAB NON REACTIVE  HEPAIGM NON REACTIVE  HEPBIGM NON REACTIVE    MR ABDOMEN MRCP WO CONTRAST  Result Date: 11/24/2020 CLINICAL DATA:  Abdominal pain, cholelithiasis and intractable emesis. EXAM: MRI ABDOMEN WITHOUT CONTRAST  (INCLUDING MRCP) TECHNIQUE: Multiplanar multisequence MR imaging of the abdomen was performed. Heavily T2-weighted images of the biliary and pancreatic ducts were obtained, and three-dimensional MRCP images were rendered by post processing. COMPARISON:  Ultrasound 11/19/2020 FINDINGS: Lower chest: Lung bases are clear. Normal heart size. No pericardial effusion. Hepatobiliary: Normal intrinsic liver signal. No significant signal dropout on in and out of phase imaging. No visible concerning focal liver lesion. Smooth liver surface contour. No significant gallbladder wall thickening. No pericholecystic fluid or edematous signal. Gradient signal within the gallbladder lumen compatible with some layering biliary sludge. No angular filling defects are identified within the gallbladder lumen or imaged biliary tree. Intra and extrahepatic biliary caliber is within normal limits. Pancreas: No pancreatic  ductal dilatation or surrounding inflammatory changes. Spleen:  Normal in size. No concerning splenic lesions. Adrenals/Urinary Tract: Normal adrenals. No worrisome renal masses. No evidence of hydronephrosis. Stomach/Bowel: Visualized portions within the abdomen are unremarkable. No evidence of bowel obstruction. No worrisome mesenteric findings. Vascular/Lymphatic: No pathologically enlarged lymph nodes identified. No abdominal aortic aneurysm demonstrated. Other: Wide field of view  images demonstrated gravid uterus, incompletely assessed on this exam of the abdomen. No visible free intraperitoneal fluid. No bowel containing hernias. Musculoskeletal: Osseous structures are unremarkable with grossly normal marrow signal. Normal cord signal. IMPRESSION: 1. Gradient signal within the gallbladder lumen, compatible with biliary sludge. 2. No discernible angular filling defects to suggest cholelithiasis or choledocholithiasis. 3. No MR evidence of cholecystitis or biliary ductal dilatation. 4. Gravid uterus partially visualized. 5. Otherwise unremarkable abdominal MR. Electronically Signed   By: Kreg Shropshire M.D.   On: 11/24/2020 22:15   US LIVER DOPPLER  Result Date: 11/26/2020 CLINICAL DATA:  Elevated LFTs EXAM: DUPLEX ULTRASOUND OF LIVER TECHNIQUE: Color and duplex Doppler ultrasound was performed to evaluate the hepatic in-flow and out-flow vessels. COMPARISON:  MRCP from previous day FINDINGS: Liver: Normal parenchymal echogenicity. Normal hepatic contour without nodularity. No focal lesion, mass or intrahepatic biliary ductal dilatation. Main Portal Vein size: 1 cm Portal Vein Velocities (all hepatopetal): Main Prox:  22 cm/sec Main Mid: 14 cm/sec Main Dist:  17 cm/sec Right: 12 cm/sec Left: 9 cm/sec Hepatic Vein Velocities (all hepatofugal): Right:  16 cm/sec Middle:  35 cm/sec Left:  38 cm/sec IVC: Present and patent with normal respiratory phasicity. Hepatic Artery Velocity:  88 cm/sec Splenic Vein Velocity:  13 cm/sec Spleen: 10 cm x 4.2 cm x 2.7 cm with a total volume of 59 cm^3 (411 cm^3 is upper limit normal) Portal Vein Occlusion/Thrombus: No Splenic Vein Occlusion/Thrombus: No Ascites: None Varices: None IMPRESSION: Unremarkable hepatic vascular Doppler evaluation. Electronically Signed   By: Corlis Leak M.D.   On: 11/26/2020 04:54     Active Problems:   [redacted] weeks gestation of pregnancy   Cholelithiasis affecting pregnancy in second trimester, antepartum   Cholelithiasis  complicating pregnancy in second trimester, antepartum     LOS: 0 days     Edman Circle, MD 11/26/2020, 4:23 PM [facility] [phone]

## 2020-11-26 NOTE — Progress Notes (Signed)
Progress Note  Chief Complaint:    nausea, vomiting, RUQ pain and elevated liver tests     ASSESSMENT AND PLAN   # 25 yo female, [redacted] weeks gestation admitted with several weeks of nausea / vomiting,  new RUQ pain, and progressive elevation of liver chemistries (  mainly hepatocellular). She had known cholelithiasis but no evidence for choledocholithiasis on [place] or MRCP . She is s/p lap cholecystectomy yesterday and post-op diagnosis was acute cholecystitis. IOC not done due to pregnant state. Surgery wanted GI evaluation of  abnormal liver tests.   --Having persistent N/V today but probably related to pregnancy. She complains of ongoing RUQ pain but feels the pain is now more of post-op pain.  --Liver tests improved post cholecystectomy.  --Platelets 120 today.  --So far, acute viral hepatitis panel is negative. CMV negative.  IgG negative. No evidence for Wilson's disease. Liver doppler normal   --Awaiting EBV serology, autoimmune studies --Ferritin is normal at 282 but iron saturation is significantly elevated at 73%. She doesn't take oral iron at home.  Will obtain labs for hemochromatosis --Avoid hepatotoxic medications.  --Await remaining labs --am cbc, LFTs        SUBJECTIVE       Still having nausea / vomiting today x 4. Endorses RUQ but feel it is post-op pain.    OBJECTIVE      Scheduled inpatient medications:   acetaminophen  1,000 mg Oral Q6H   docusate sodium  100 mg Oral Daily   prenatal multivitamin  1 tablet Oral Q1200   scopolamine  1 patch Transdermal Q72H   thiamine injection  100 mg Intravenous Daily   Continuous inpatient infusions:   lactated ringers 75 mL/hr at 11/26/20 1048   potassium chloride 10 mEq (11/26/20 1141)   promethazine (PHENERGAN) injection (IM or IVPB) 25 mg (11/26/20 0828)   PRN inpatient medications: calcium carbonate, cyclobenzaprine, morphine injection, oxyCODONE, promethazine (PHENERGAN) injection (IM or IVPB),  zolpidem  Vital signs in last 24 hours: Temp:  [97.2 F (36.2 C)-98.5 F (36.9 C)] 98.2 F (36.8 C) (07/29 0742) Pulse Rate:  [89-113] 101 (07/29 0742) Resp:  [13-31] 18 (07/29 0742) BP: (119-144)/(71-94) 143/88 (07/29 0742) SpO2:  [98 %-100 %] 100 % (07/29 0742) Weight:  [58.1 kg] 58.1 kg (07/28 1254) Last BM Date:  (not sure)  Intake/Output Summary (Last 24 hours) at 11/26/2020 1221 Last data filed at 11/26/2020 1132 Gross per 24 hour  Intake 1390 ml  Output 1131 ml  Net 259 ml     Physical Exam:  General: Alert female in NAD Heart:  Regular rate and rhythm. No lower extremity edema Pulmonary: Normal respiratory effort Abdomen: Soft, nondistended, expected post-op RUQ tenderness. Normal bowel sounds.  Neurologic: Alert and oriented Psych: Pleasant. Cooperative.   Filed Weights   11/24/20 1340 11/25/20 1254  Weight: 58.4 kg 58.1 kg    Intake/Output from previous day: 07/28 0701 - 07/29 0700 In: 1100 [I.V.:1100] Out: 530 [Urine:500; Blood:30] Intake/Output this shift: Total I/O In: 290 [P.O.:240; IV Piggyback:50] Out: 601 [Urine:500; Emesis/NG output:101]    Lab Results: Recent Labs    11/24/20 1420 11/25/20 0652 11/26/20 0538  WBC 12.0* 8.1 7.1  HGB 14.2 10.8* 9.2*  HCT 41.7 31.4* 26.6*  PLT 252 170 120*   BMET Recent Labs    11/24/20 1420 11/25/20 0652 11/26/20 0538  NA 131* 132* 133*  K 3.0* 2.6* 2.8*  CL 98 104 105  CO2 18* 21* 20*  GLUCOSE 106*  89 82  BUN 9 11 8   CREATININE 0.78 0.55 0.44  CALCIUM 9.9 8.4* 8.0*   LFT Recent Labs    11/25/20 1736 11/26/20 0538  PROT 5.1* 4.4*  ALBUMIN 2.4* 2.0*  AST 261* 202*  ALT 507* 383*  ALKPHOS 60 51  BILITOT 3.4* 2.8*  BILIDIR 1.4*  --   IBILI 2.0*  --    PT/INR Recent Labs    11/26/20 0538  LABPROT 15.0  INR 1.2   Hepatitis Panel Recent Labs    11/25/20 1736  HEPBSAG NON REACTIVE  HCVAB NON REACTIVE  HEPAIGM NON REACTIVE  HEPBIGM NON REACTIVE   Component     Latest Ref  Rng & Units 11/25/2020  Hepatitis B Surface Ag     NON REACTIVE NON REACTIVE  HCV Ab     NON REACTIVE NON REACTIVE  Hep A Ab, IgM     NON REACTIVE NON REACTIVE  Hep B Core Ab, IgM     NON REACTIVE NON REACTIVE  Iron     28 - 170 ug/dL 11/27/2020 (H)  TIBC     076 - 450 ug/dL 226  Saturation Ratios     10.4 - 31.8 % 73 (H)  UIBC     ug/dL 68  CMV IgM     0.0 - 333 AU/mL <30.0  Ceruloplasmin     19.0 - 39.0 mg/dL 54.5 (H)  IgG (Immunoglobin G), Serum     586 - 1,602 mg/dL 62.5  Ferritin     11 - 307 ng/mL 282      MR ABDOMEN MRCP WO CONTRAST  Result Date: 11/24/2020 CLINICAL DATA:  Abdominal pain, cholelithiasis and intractable emesis. EXAM: MRI ABDOMEN WITHOUT CONTRAST  (INCLUDING MRCP) TECHNIQUE: Multiplanar multisequence MR imaging of the abdomen was performed. Heavily T2-weighted images of the biliary and pancreatic ducts were obtained, and three-dimensional MRCP images were rendered by post processing. COMPARISON:  Ultrasound 11/19/2020 FINDINGS: Lower chest: Lung bases are clear. Normal heart size. No pericardial effusion. Hepatobiliary: Normal intrinsic liver signal. No significant signal dropout on in and out of phase imaging. No visible concerning focal liver lesion. Smooth liver surface contour. No significant gallbladder wall thickening. No pericholecystic fluid or edematous signal. Gradient signal within the gallbladder lumen compatible with some layering biliary sludge. No angular filling defects are identified within the gallbladder lumen or imaged biliary tree. Intra and extrahepatic biliary caliber is within normal limits. Pancreas: No pancreatic ductal dilatation or surrounding inflammatory changes. Spleen:  Normal in size. No concerning splenic lesions. Adrenals/Urinary Tract: Normal adrenals. No worrisome renal masses. No evidence of hydronephrosis. Stomach/Bowel: Visualized portions within the abdomen are unremarkable. No evidence of bowel obstruction. No worrisome mesenteric  findings. Vascular/Lymphatic: No pathologically enlarged lymph nodes identified. No abdominal aortic aneurysm demonstrated. Other: Wide field of view images demonstrated gravid uterus, incompletely assessed on this exam of the abdomen. No visible free intraperitoneal fluid. No bowel containing hernias. Musculoskeletal: Osseous structures are unremarkable with grossly normal marrow signal. Normal cord signal. IMPRESSION: 1. Gradient signal within the gallbladder lumen, compatible with biliary sludge. 2. No discernible angular filling defects to suggest cholelithiasis or choledocholithiasis. 3. No MR evidence of cholecystitis or biliary ductal dilatation. 4. Gravid uterus partially visualized. 5. Otherwise unremarkable abdominal MR. Electronically Signed   By: 11/21/2020 M.D.   On: 11/24/2020 22:15   11/26/2020 LIVER DOPPLER  Result Date: 11/26/2020 CLINICAL DATA:  Elevated LFTs EXAM: DUPLEX ULTRASOUND OF LIVER TECHNIQUE: Color and duplex Doppler ultrasound was performed to  evaluate the hepatic in-flow and out-flow vessels. COMPARISON:  MRCP from previous day FINDINGS: Liver: Normal parenchymal echogenicity. Normal hepatic contour without nodularity. No focal lesion, mass or intrahepatic biliary ductal dilatation. Main Portal Vein size: 1 cm Portal Vein Velocities (all hepatopetal): Main Prox:  22 cm/sec Main Mid: 14 cm/sec Main Dist:  17 cm/sec Right: 12 cm/sec Left: 9 cm/sec Hepatic Vein Velocities (all hepatofugal): Right:  16 cm/sec Middle:  35 cm/sec Left:  38 cm/sec IVC: Present and patent with normal respiratory phasicity. Hepatic Artery Velocity:  88 cm/sec Splenic Vein Velocity:  13 cm/sec Spleen: 10 cm x 4.2 cm x 2.7 cm with a total volume of 59 cm^3 (411 cm^3 is upper limit normal) Portal Vein Occlusion/Thrombus: No Splenic Vein Occlusion/Thrombus: No Ascites: None Varices: None IMPRESSION: Unremarkable hepatic vascular Doppler evaluation. Electronically Signed   By: Corlis Leak M.D.   On: 11/26/2020 04:54         Active Problems:   [redacted] weeks gestation of pregnancy   Cholelithiasis affecting pregnancy in second trimester, antepartum   Cholelithiasis complicating pregnancy in second trimester, antepartum     LOS: 0 days   Willette Cluster ,NP 11/26/2020, 12:21 PM

## 2020-11-26 NOTE — Progress Notes (Signed)
Progress Note  1 Day Post-Op  Subjective: Patient with significant pain overnight but did not want some of the meds that were ordered for her. Vomited. No flatus or BM. Has not been able to urinate. Had not yet been out of bed.   Objective: Vital signs in last 24 hours: Temp:  [97.2 F (36.2 C)-98.5 F (36.9 C)] 98.2 F (36.8 C) (07/29 0742) Pulse Rate:  [89-113] 101 (07/29 0742) Resp:  [13-31] 18 (07/29 0742) BP: (119-144)/(71-94) 143/88 (07/29 0742) SpO2:  [98 %-100 %] 100 % (07/29 0742) Weight:  [58.1 kg] 58.1 kg (07/28 1254) Last BM Date:  (pt states more than a week ago)  Intake/Output from previous day: 07/28 0701 - 07/29 0700 In: 1100 [I.V.:1100] Out: 530 [Urine:500; Blood:30] Intake/Output this shift: Total I/O In: -  Out: 1 [Emesis/NG output:1]  PE: General: pleasant, WD, WN female who is laying in bed in NAD HEENT: sclera anicteric Heart: regular, rate, and rhythm.   Lungs: CTAB, no wheezes, rhonchi, or rales noted.  Respiratory effort nonlabored Abd: soft, appropriately ttp, ND, BS hypoactive, incisions c/d/i    Lab Results:  Recent Labs    11/25/20 0652 11/26/20 0538  WBC 8.1 7.1  HGB 10.8* 9.2*  HCT 31.4* 26.6*  PLT 170 120*   BMET Recent Labs    11/25/20 0652 11/26/20 0538  NA 132* 133*  K 2.6* 2.8*  CL 104 105  CO2 21* 20*  GLUCOSE 89 82  BUN 11 8  CREATININE 0.55 0.44  CALCIUM 8.4* 8.0*   PT/INR Recent Labs    11/26/20 0538  LABPROT 15.0  INR 1.2   CMP     Component Value Date/Time   NA 133 (L) 11/26/2020 0538   K 2.8 (L) 11/26/2020 0538   CL 105 11/26/2020 0538   CO2 20 (L) 11/26/2020 0538   GLUCOSE 82 11/26/2020 0538   BUN 8 11/26/2020 0538   CREATININE 0.44 11/26/2020 0538   CALCIUM 8.0 (L) 11/26/2020 0538   PROT 4.4 (L) 11/26/2020 0538   ALBUMIN 2.0 (L) 11/26/2020 0538   AST 202 (H) 11/26/2020 0538   ALT 383 (H) 11/26/2020 0538   ALKPHOS 51 11/26/2020 0538   BILITOT 2.8 (H) 11/26/2020 0538   GFRNONAA >60  11/26/2020 0538   GFRAA >60 04/11/2018 1122   Lipase     Component Value Date/Time   LIPASE 62 (H) 11/26/2020 0538       Studies/Results: MR ABDOMEN MRCP WO CONTRAST  Result Date: 11/24/2020 CLINICAL DATA:  Abdominal pain, cholelithiasis and intractable emesis. EXAM: MRI ABDOMEN WITHOUT CONTRAST  (INCLUDING MRCP) TECHNIQUE: Multiplanar multisequence MR imaging of the abdomen was performed. Heavily T2-weighted images of the biliary and pancreatic ducts were obtained, and three-dimensional MRCP images were rendered by post processing. COMPARISON:  Ultrasound 11/19/2020 FINDINGS: Lower chest: Lung bases are clear. Normal heart size. No pericardial effusion. Hepatobiliary: Normal intrinsic liver signal. No significant signal dropout on in and out of phase imaging. No visible concerning focal liver lesion. Smooth liver surface contour. No significant gallbladder wall thickening. No pericholecystic fluid or edematous signal. Gradient signal within the gallbladder lumen compatible with some layering biliary sludge. No angular filling defects are identified within the gallbladder lumen or imaged biliary tree. Intra and extrahepatic biliary caliber is within normal limits. Pancreas: No pancreatic ductal dilatation or surrounding inflammatory changes. Spleen:  Normal in size. No concerning splenic lesions. Adrenals/Urinary Tract: Normal adrenals. No worrisome renal masses. No evidence of hydronephrosis. Stomach/Bowel: Visualized portions within the  abdomen are unremarkable. No evidence of bowel obstruction. No worrisome mesenteric findings. Vascular/Lymphatic: No pathologically enlarged lymph nodes identified. No abdominal aortic aneurysm demonstrated. Other: Wide field of view images demonstrated gravid uterus, incompletely assessed on this exam of the abdomen. No visible free intraperitoneal fluid. No bowel containing hernias. Musculoskeletal: Osseous structures are unremarkable with grossly normal marrow  signal. Normal cord signal. IMPRESSION: 1. Gradient signal within the gallbladder lumen, compatible with biliary sludge. 2. No discernible angular filling defects to suggest cholelithiasis or choledocholithiasis. 3. No MR evidence of cholecystitis or biliary ductal dilatation. 4. Gravid uterus partially visualized. 5. Otherwise unremarkable abdominal MR. Electronically Signed   By: Kreg Shropshire M.D.   On: 11/24/2020 22:15   US LIVER DOPPLER  Result Date: 11/26/2020 CLINICAL DATA:  Elevated LFTs EXAM: DUPLEX ULTRASOUND OF LIVER TECHNIQUE: Color and duplex Doppler ultrasound was performed to evaluate the hepatic in-flow and out-flow vessels. COMPARISON:  MRCP from previous day FINDINGS: Liver: Normal parenchymal echogenicity. Normal hepatic contour without nodularity. No focal lesion, mass or intrahepatic biliary ductal dilatation. Main Portal Vein size: 1 cm Portal Vein Velocities (all hepatopetal): Main Prox:  22 cm/sec Main Mid: 14 cm/sec Main Dist:  17 cm/sec Right: 12 cm/sec Left: 9 cm/sec Hepatic Vein Velocities (all hepatofugal): Right:  16 cm/sec Middle:  35 cm/sec Left:  38 cm/sec IVC: Present and patent with normal respiratory phasicity. Hepatic Artery Velocity:  88 cm/sec Splenic Vein Velocity:  13 cm/sec Spleen: 10 cm x 4.2 cm x 2.7 cm with a total volume of 59 cm^3 (411 cm^3 is upper limit normal) Portal Vein Occlusion/Thrombus: No Splenic Vein Occlusion/Thrombus: No Ascites: None Varices: None IMPRESSION: Unremarkable hepatic vascular Doppler evaluation. Electronically Signed   By: Corlis Leak M.D.   On: 11/26/2020 04:54    Anti-infectives: Anti-infectives (From admission, onward)    None        Assessment/Plan Cholelithiasis with elevated LFT's Acute cholecystitis  S/p laparoscopic cholecystectomy 11/25/20 Dr. Luisa Hart - POD#1 - LFTs and Tbili downtrending this AM, GI following as well - pt with significant pain and nausea - discussed utilization of pain meds and antiemetics - pt  needs to mobilize more this AM Hypokalemia - K 2.8 this AM, ordered IV replacement  Urinary retention - pt got up to try to urinate while I was leaving, if unable would recommend bladder scan +/- I&O cath   FEN - NPO, decreased IVF to 75 cc/h VTE - SCDs ID - No current abx  LOS: 0 days    Juliet Rude, [facility] 11/26/2020, 9:33 AM Please see Amion for pager number during day hours 7:00am-4:30pm

## 2020-11-26 NOTE — Progress Notes (Signed)
FACULTY PRACTICE ANTEPARTUM PROGRESS NOTE  Deborah Evans is a 25 y.o. G3P1011 at [redacted]w[redacted]d who is admitted for abdominal pain with possible gall bladder involvement.  Estimated Date of Delivery: 05/17/21 Fetal presentation is unsure.  Length of Stay:  0 Days. Admitted 11/24/2020  Subjective: Pt is status post lap cholecystectomy.  She still notes moderate abdominal pain and nausea.  Pain medication has been sporadic.  She denies any cramping, loss of fluid or vaginal bleeding.  Vitals:  Blood pressure (!) 143/88, pulse (!) 101, temperature 98.2 F (36.8 C), temperature source Oral, resp. rate 18, height 5\' 8"  (1.727 m), weight 58.1 kg, last menstrual period 08/10/2020, SpO2 100 %, unknown if currently breastfeeding. Physical Examination: CONSTITUTIONAL: Well-developed, well-nourished female in no acute distress.  HENT:  Normocephalic, atraumatic, External right and left ear normal. Oropharynx is clear and moist EYES: Conjunctivae and EOM are normal.  NECK: Normal range of motion, supple, no masses. SKIN: Skin is warm and dry. No rash noted. Not diaphoretic. No erythema. No pallor. NEUROLGIC: Alert and oriented to person, place, and time. Normal reflexes, muscle tone coordination. No cranial nerve deficit noted. PSYCHIATRIC: Normal mood and affect. Normal behavior. Normal judgment and thought content. CARDIOVASCULAR: Normal heart rate noted, regular rhythm RESPIRATORY: Effort and breath sounds normal, no problems with respiration noted MUSCULOSKELETAL: Normal range of motion. No edema and no tenderness. ABDOMEN: Soft,appropriately tender, nondistended, gravid. CERVIX: deferred  Fetal monitoring: FHR: 135 Uterine activity: none  Results for orders placed or performed during the hospital encounter of 11/24/20 (from the past 48 hour(s))  Urinalysis, Routine w reflex microscopic Urine, Clean Catch     Status: Abnormal   Collection Time: 11/24/20  2:20 PM  Result Value Ref Range   Color, Urine  AMBER (A) YELLOW    Comment: BIOCHEMICALS MAY BE AFFECTED BY COLOR   APPearance HAZY (A) CLEAR   Specific Gravity, Urine 1.024 1.005 - 1.030   pH 6.0 5.0 - 8.0   Glucose, UA NEGATIVE NEGATIVE mg/dL   Hgb urine dipstick NEGATIVE NEGATIVE   Bilirubin Urine SMALL (A) NEGATIVE   Ketones, ur 80 (A) NEGATIVE mg/dL   Protein, ur 11/26/20 (A) NEGATIVE mg/dL   Nitrite NEGATIVE NEGATIVE   Leukocytes,Ua TRACE (A) NEGATIVE   RBC / HPF 0-5 0 - 5 RBC/hpf   WBC, UA 11-20 0 - 5 WBC/hpf   Bacteria, UA RARE (A) NONE SEEN   Squamous Epithelial / LPF 0-5 0 - 5   Mucus PRESENT    Hyaline Casts, UA PRESENT    Granular Casts, UA PRESENT     Comment: Performed at [facility], [address] [place], [place]  CBC     Status: Abnormal   Collection Time: 11/24/20  2:20 PM  Result Value Ref Range   WBC 12.0 (H) 4.0 - 10.5 K/uL   RBC 5.41 (H) 3.87 - 5.11 MIL/uL   Hemoglobin 14.2 12.0 - 15.0 g/dL   HCT 11/26/20 99.3 - 71.6 %   MCV 77.1 (L) 80.0 - 100.0 fL   MCH 26.2 26.0 - 34.0 pg   MCHC 34.1 30.0 - 36.0 g/dL   RDW 96.7 89.3 - 81.0 %   Platelets 252 150 - 400 K/uL   nRBC 0.0 0.0 - 0.2 %    Comment: Performed at [facility], [address], [place], [place]  Comprehensive metabolic panel     Status: Abnormal   Collection Time: 11/24/20  2:20 PM  Result Value Ref  Range   Sodium 131 (L) 135 - 145 mmol/L   Potassium 3.0 (L) 3.5 - 5.1 mmol/L   Chloride 98 98 - 111 mmol/L   CO2 18 (L) 22 - 32 mmol/L   Glucose, Bld 106 (H) 70 - 99 mg/dL    Comment: Glucose reference range applies only to samples taken after fasting for at least 8 hours.   BUN 9 6 - 20 mg/dL   Creatinine, Ser 9.77 0.44 - 1.00 mg/dL   Calcium 9.9 8.9 - 41.4 mg/dL   Total Protein 7.5 6.5 - 8.1 g/dL   Albumin 3.4 (L) 3.5 - 5.0 g/dL   AST 239 (H) 15 - 41 U/L   ALT 694 (H) 0 - 44 U/L   Alkaline Phosphatase 87 38 - 126 U/L   Total Bilirubin 4.3 (H) 0.3 - 1.2 mg/dL   GFR, Estimated >53 >20 mL/min    Comment:  (NOTE) Calculated using the CKD-EPI Creatinine Equation (2021)    Anion gap 15 5 - 15    Comment: Performed at [facility], [address] [place], [place] [address]  Lipase, blood     Status: Abnormal   Collection Time: 11/24/20  2:20 PM  Result Value Ref Range   Lipase 85 (H) 11 - 51 U/L    Comment: Performed at [facility], [address], [place], [place] [address]  Type and screen [facility]     Status: None   Collection Time: 11/24/20  4:45 PM  Result Value Ref Range   ABO/RH(D) O POS    Antibody Screen NEG    Sample Expiration      11/27/2020,2359 Performed at [facility], [address], [place], [place] [address]   Resp Panel by RT-PCR (Flu A&B, Covid) Nasopharyngeal Swab     Status: None   Collection Time: 11/24/20  4:45 PM   Specimen: Nasopharyngeal Swab; Nasopharyngeal(NP) swabs in vial transport medium  Result Value Ref Range   SARS Coronavirus 2 by RT PCR NEGATIVE NEGATIVE    Comment: (NOTE) SARS-CoV-2 target nucleic acids are NOT DETECTED.  The SARS-CoV-2 RNA is generally detectable in upper respiratory specimens during the acute phase of infection. The lowest concentration of SARS-CoV-2 viral copies this assay can detect is 138 copies/mL. A negative result does not preclude SARS-Cov-2 infection and should not be used as the sole basis for treatment or other patient management decisions. A negative result may occur with  improper specimen collection/handling, submission of specimen other than nasopharyngeal swab, presence of viral mutation(s) within the areas targeted by this assay, and inadequate number of viral copies(<138 copies/mL). A negative result must be combined with clinical observations, patient history, and epidemiological information. The expected result is Negative.  Fact Sheet for Patients:  BloggerCourse.com  Fact Sheet for Healthcare Providers:   SeriousBroker.it  This test is no t yet approved or cleared by the [place] FDA and  has been authorized for detection and/or diagnosis of SARS-CoV-2 by FDA under an Emergency Use Authorization (EUA). This EUA will remain  in effect (meaning this test can be used) for the duration of the COVID-19 declaration under Section 564(b)(1) of the Act, 21 U.S.C.section 360bbb-3(b)(1), unless the authorization is terminated  or revoked sooner.       Influenza A by PCR NEGATIVE NEGATIVE   Influenza B by PCR NEGATIVE NEGATIVE    Comment: (NOTE) The Xpert Xpress SARS-CoV-2/FLU/RSV plus assay is intended as an aid in the diagnosis  of influenza from Nasopharyngeal swab specimens and should not be used as a sole basis for treatment. Nasal washings and aspirates are unacceptable for Xpert Xpress SARS-CoV-2/FLU/RSV testing.  Fact Sheet for Patients: BloggerCourse.comhttps://www.fda.gov/media/152166/download  Fact Sheet for Healthcare Providers: SeriousBroker.ithttps://www.fda.gov/media/152162/download  This test is not yet approved or cleared by the [place] FDA and has been authorized for detection and/or diagnosis of SARS-CoV-2 by FDA under an Emergency Use Authorization (EUA). This EUA will remain in effect (meaning this test can be used) for the duration of the COVID-19 declaration under Section 564(b)(1) of the Act, 21 U.S.C. section 360bbb-3(b)(1), unless the authorization is terminated or revoked.  Performed at [facility], [address] [place], [place] [address]   CBC     Status: Abnormal   Collection Time: 11/25/20  6:52 AM  Result Value Ref Range   WBC 8.1 4.0 - 10.5 K/uL   RBC 4.07 3.87 - 5.11 MIL/uL   Hemoglobin 10.8 (L) 12.0 - 15.0 g/dL   HCT 60.431.4 (L) 54.036.0 - 98.146.0 %   MCV 77.1 (L) 80.0 - 100.0 fL   MCH 26.5 26.0 - 34.0 pg   MCHC 34.4 30.0 - 36.0 g/dL   RDW 19.114.2 47.811.5 - 29.515.5 %   Platelets 170 150 - 400 K/uL   nRBC 0.0 0.0 - 0.2 %    Comment: Performed at [facility], [address] [place], [place] [address]  Comprehensive metabolic panel     Status: Abnormal   Collection Time: 11/25/20  6:52 AM  Result Value Ref Range   Sodium 132 (L) 135 - 145 mmol/L   Potassium 2.6 (LL) 3.5 - 5.1 mmol/L    Comment: CRITICAL RESULT CALLED TO, READ BACK BY AND VERIFIED WITH: LAWSON,P RN @ [phone] 11/25/20 LEONARD,A    Chloride 104 98 - 111 mmol/L   CO2 21 (L) 22 - 32 mmol/L   Glucose, Bld 89 70 - 99 mg/dL    Comment: Glucose reference range applies only to samples taken after fasting for at least 8 hours.   BUN 11 6 - 20 mg/dL   Creatinine, Ser 8.460.55 0.44 - 1.00 mg/dL   Calcium 8.4 (L) 8.9 - 10.3 mg/dL   Total Protein 5.5 (L) 6.5 - 8.1 g/dL   Albumin 2.4 (L) 3.5 - 5.0 g/dL   AST 962246 (H) 15 - 41 U/L   ALT 530 (H) 0 - 44 U/L   Alkaline Phosphatase 64 38 - 126 U/L   Total Bilirubin 4.1 (H) 0.3 - 1.2 mg/dL   GFR, Estimated >95>60 >28>60 mL/min    Comment: (NOTE) Calculated using the CKD-EPI Creatinine Equation (2021)    Anion gap 7 5 - 15    Comment: Performed at [facility], [address] [place], [place] [address]  CMV IgM     Status: None   Collection Time: 11/25/20  5:36 PM  Result Value Ref Range   CMV IgM <30.0 0.0 - 29.9 AU/mL    Comment: (NOTE)                                Negative         <30.0                                Equivocal  30.0 - 34.9  Positive         >34.9 A positive result is generally indicative of acute infection, reactivation or persistent IgM production. Performed At: [facility] [address] [place], [place] [id number] Jolene Schimke MD NO:[id number]   Ceruloplasmin     Status: Abnormal   Collection Time: 11/25/20  5:36 PM  Result Value Ref Range   Ceruloplasmin 49.7 (H) 19.0 - 39.0 mg/dL    Comment: (NOTE) Performed At: [facility] [address] [place], [place] [id number] Jolene Schimke MD UT:[id number]   Hepatitis panel, acute     Status: None    Collection Time: 11/25/20  5:36 PM  Result Value Ref Range   Hepatitis B Surface Ag NON REACTIVE NON REACTIVE   HCV Ab NON REACTIVE NON REACTIVE    Comment: (NOTE) Nonreactive HCV antibody screen is consistent with no HCV infections,  unless recent infection is suspected or other evidence exists to indicate HCV infection.     Hep A IgM NON REACTIVE NON REACTIVE   Hep B C IgM NON REACTIVE NON REACTIVE    Comment: Performed at [facility], [address] [place], [place] [address]  IgG     Status: None   Collection Time: 11/25/20  5:36 PM  Result Value Ref Range   IgG (Immunoglobin G), Serum 667 586 - 1,602 mg/dL    Comment: (NOTE) Performed At: [facility] [address] [place], [place] [id number] Jolene Schimke MD BS:[id number]   Vitamin B12     Status: None   Collection Time: 11/25/20  5:36 PM  Result Value Ref Range   Vitamin B-12 465 180 - 914 pg/mL    Comment: (NOTE) This assay is not validated for testing neonatal or myeloproliferative syndrome specimens for Vitamin B12 levels. Performed at [facility], [address] [place], [place] [address]   Iron and TIBC     Status: Abnormal   Collection Time: 11/25/20  5:36 PM  Result Value Ref Range   Iron 183 (H) 28 - 170 ug/dL   TIBC 935 701 - 779 ug/dL   Saturation Ratios 73 (H) 10.4 - 31.8 %   UIBC 68 ug/dL    Comment: Performed at [facility], [address], [place], [place] [address]  Ferritin     Status: None   Collection Time: 11/25/20  5:36 PM  Result Value Ref Range   Ferritin 282 11 - 307 ng/mL    Comment: Performed at [facility], [address] [place], [place] [address]  Hepatic function panel     Status: Abnormal   Collection Time: 11/25/20  5:36 PM  Result Value Ref Range   Total Protein 5.1 (L) 6.5 - 8.1 g/dL   Albumin 2.4 (L) 3.5 - 5.0 g/dL   AST 007 (H) 15 - 41 U/L   ALT 507 (H) 0 - 44 U/L   Alkaline Phosphatase 60 38 - 126 U/L   Total Bilirubin 3.4 (H) 0.3  - 1.2 mg/dL   Bilirubin, Direct 1.4 (H) 0.0 - 0.2 mg/dL   Indirect Bilirubin 2.0 (H) 0.3 - 0.9 mg/dL    Comment: Performed at [facility], [address] [place], [place] [address]  Folate     Status: Abnormal   Collection Time: 11/25/20  5:37 PM  Result Value Ref Range   Folate 5.9 (L) >5.9 ng/mL    Comment: Performed at [facility], [address], [place], [place] [address]  Reticulocytes     Status: None   Collection Time: 11/25/20  5:37 PM  Result Value Ref Range   Retic Ct Pct 2.2 0.4 - 3.1 %   RBC. 3.88 3.87 - 5.11 MIL/uL   Retic Count, Absolute 84.6 19.0 - 186.0 K/uL   Immature Retic Fract 9.6 2.3 - 15.9 %    Comment: Performed at [facility], [address], [place], [place] [address]  Comprehensive metabolic panel     Status: Abnormal   Collection Time: 11/26/20  5:38 AM  Result Value Ref Range   Sodium 133 (L) 135 - 145 mmol/L   Potassium 2.8 (L) 3.5 - 5.1 mmol/L   Chloride 105 98 - 111 mmol/L   CO2 20 (L) 22 - 32 mmol/L   Glucose, Bld 82 70 - 99 mg/dL    Comment: Glucose reference range applies only to samples taken after fasting for at least 8 hours.   BUN 8 6 - 20 mg/dL   Creatinine, Ser 1.30 0.44 - 1.00 mg/dL   Calcium 8.0 (L) 8.9 - 10.3 mg/dL   Total Protein 4.4 (L) 6.5 - 8.1 g/dL   Albumin 2.0 (L) 3.5 - 5.0 g/dL   AST 865 (H) 15 - 41 U/L   ALT 383 (H) 0 - 44 U/L   Alkaline Phosphatase 51 38 - 126 U/L   Total Bilirubin 2.8 (H) 0.3 - 1.2 mg/dL   GFR, Estimated >78 >46 mL/min    Comment: (NOTE) Calculated using the CKD-EPI Creatinine Equation (2021)    Anion gap 8 5 - 15    Comment: Performed at [facility], [address], [place], [place] [address]  Lipase, blood     Status: Abnormal   Collection Time: 11/26/20  5:38 AM  Result Value Ref Range   Lipase 62 (H) 11 - 51 U/L    Comment: Performed at [facility], [address] [place], [place] [address]  CBC Once     Status: Abnormal   Collection Time: 11/26/20  5:38 AM   Result Value Ref Range   WBC 7.1 4.0 - 10.5 K/uL   RBC 3.37 (L) 3.87 - 5.11 MIL/uL   Hemoglobin 9.2 (L) 12.0 - 15.0 g/dL   HCT 24.4 (L) 01.0 - 27.2 %   MCV 78.9 (L) 80.0 - 100.0 fL   MCH 27.3 26.0 - 34.0 pg   MCHC 34.6 30.0 - 36.0 g/dL   RDW 53.6 64.4 - 03.4 %   Platelets 120 (L) 150 - 400 K/uL   nRBC 0.0 0.0 - 0.2 %    Comment: Performed at [facility], [address], [place], [place] [address]  Protime-INR     Status: None   Collection Time: 11/26/20  5:38 AM  Result Value Ref Range   Prothrombin Time 15.0 11.4 - 15.2 seconds   INR 1.2 0.8 - 1.2    Comment: (NOTE) INR goal varies based on device and disease states. Performed at [facility], [address], [place], [place] [address]     I have reviewed the patient's current medications.  ASSESSMENT: Active Problems:   [redacted] weeks gestation of pregnancy   Cholelithiasis affecting pregnancy in second trimester, antepartum   Cholelithiasis complicating pregnancy in second trimester, antepartum   PLAN: Current pain level appears appropriate for POD 1 No issues from OB standpoint Can use morphine PCA if needed, but po percocet or oxycodone would be more appropriate. Ibuprofen can be used  for a short course, 24-48 hours, if needed in the second trimester.  Would use 600-800 mg. Pt needs to be up and ambulate Advance diet as tolerated. Anti emetics as needed. Appreciate surgery and GI assistance Replacement of potassium per surgery.    Continue routine antenatal care.   Mariel Aloe, MD [facility] Faculty Attending, [facility] 11/26/2020 12:14 PM

## 2020-11-26 NOTE — Progress Notes (Signed)
Morphine 2mg /ml injection walked back to pharmacy. Pt did not need PRN pain medication during shift. Verified with , RN.

## 2020-11-27 ENCOUNTER — Encounter: Payer: Self-pay | Admitting: Obstetrics and Gynecology

## 2020-11-27 DIAGNOSIS — Z20822 Contact with and (suspected) exposure to covid-19: Secondary | ICD-10-CM | POA: Diagnosis not present

## 2020-11-27 DIAGNOSIS — R339 Retention of urine, unspecified: Secondary | ICD-10-CM | POA: Diagnosis not present

## 2020-11-27 DIAGNOSIS — R1011 Right upper quadrant pain: Secondary | ICD-10-CM | POA: Diagnosis not present

## 2020-11-27 DIAGNOSIS — O26891 Other specified pregnancy related conditions, first trimester: Secondary | ICD-10-CM | POA: Diagnosis not present

## 2020-11-27 DIAGNOSIS — O99612 Diseases of the digestive system complicating pregnancy, second trimester: Principal | ICD-10-CM

## 2020-11-27 DIAGNOSIS — Z3A15 15 weeks gestation of pregnancy: Secondary | ICD-10-CM | POA: Diagnosis not present

## 2020-11-27 DIAGNOSIS — R7989 Other specified abnormal findings of blood chemistry: Secondary | ICD-10-CM | POA: Diagnosis not present

## 2020-11-27 DIAGNOSIS — O26612 Liver and biliary tract disorders in pregnancy, second trimester: Secondary | ICD-10-CM | POA: Diagnosis not present

## 2020-11-27 DIAGNOSIS — K8000 Calculus of gallbladder with acute cholecystitis without obstruction: Secondary | ICD-10-CM | POA: Diagnosis not present

## 2020-11-27 DIAGNOSIS — K8020 Calculus of gallbladder without cholecystitis without obstruction: Secondary | ICD-10-CM | POA: Diagnosis not present

## 2020-11-27 DIAGNOSIS — O211 Hyperemesis gravidarum with metabolic disturbance: Secondary | ICD-10-CM | POA: Diagnosis not present

## 2020-11-27 DIAGNOSIS — O132 Gestational [pregnancy-induced] hypertension without significant proteinuria, second trimester: Secondary | ICD-10-CM | POA: Diagnosis not present

## 2020-11-27 LAB — CBC
HCT: 30 % — ABNORMAL LOW (ref 36.0–46.0)
Hemoglobin: 10 g/dL — ABNORMAL LOW (ref 12.0–15.0)
MCH: 26.6 pg (ref 26.0–34.0)
MCHC: 33.3 g/dL (ref 30.0–36.0)
MCV: 79.8 fL — ABNORMAL LOW (ref 80.0–100.0)
Platelets: 127 10*3/uL — ABNORMAL LOW (ref 150–400)
RBC: 3.76 MIL/uL — ABNORMAL LOW (ref 3.87–5.11)
RDW: 14.5 % (ref 11.5–15.5)
WBC: 7 10*3/uL (ref 4.0–10.5)
nRBC: 0 % (ref 0.0–0.2)

## 2020-11-27 LAB — COMPREHENSIVE METABOLIC PANEL
ALT: 369 U/L — ABNORMAL HIGH (ref 0–44)
AST: 185 U/L — ABNORMAL HIGH (ref 15–41)
Albumin: 2.2 g/dL — ABNORMAL LOW (ref 3.5–5.0)
Alkaline Phosphatase: 62 U/L (ref 38–126)
Anion gap: 7 (ref 5–15)
BUN: <5 mg/dL — ABNORMAL LOW (ref 6–20)
CO2: 22 mmol/L (ref 22–32)
Calcium: 7.9 mg/dL — ABNORMAL LOW (ref 8.9–10.3)
Chloride: 104 mmol/L (ref 98–111)
Creatinine, Ser: 0.55 mg/dL (ref 0.44–1.00)
GFR, Estimated: >60 mL/min (ref >60)
Glucose, Bld: 78 mg/dL (ref 70–99)
Potassium: 3.3 mmol/L — ABNORMAL LOW (ref 3.5–5.1)
Sodium: 133 mmol/L — ABNORMAL LOW (ref 135–145)
Total Bilirubin: 2.6 mg/dL — ABNORMAL HIGH (ref 0.3–1.2)
Total Protein: 4.9 g/dL — ABNORMAL LOW (ref 6.5–8.1)

## 2020-11-27 LAB — LIPASE, BLOOD: Lipase: 47 U/L (ref 11–51)

## 2020-11-27 LAB — MAGNESIUM: Magnesium: 1.6 mg/dL — ABNORMAL LOW (ref 1.7–2.4)

## 2020-11-27 MED ORDER — ENOXAPARIN SODIUM 40 MG/0.4ML IJ SOSY
40.0000 mg | PREFILLED_SYRINGE | INTRAMUSCULAR | Status: DC
  Administered 2020-11-27 – 2020-11-30 (×4): 40 mg via SUBCUTANEOUS
  Filled 2020-11-27 (×4): qty 0.4

## 2020-11-27 MED ORDER — BISACODYL 10 MG RE SUPP
10.0000 mg | Freq: Once | RECTAL | Status: DC
  Filled 2020-11-27 (×2): qty 1

## 2020-11-27 MED ORDER — POTASSIUM CHLORIDE 10 MEQ/100ML IV SOLN
10.0000 meq | INTRAVENOUS | Status: AC
  Administered 2020-11-27 (×6): 10 meq via INTRAVENOUS
  Filled 2020-11-27 (×6): qty 100

## 2020-11-27 MED ORDER — POLYETHYLENE GLYCOL 3350 17 G PO PACK
17.0000 g | PACK | Freq: Every day | ORAL | Status: DC | PRN
  Administered 2020-11-27: 17 g via ORAL
  Filled 2020-11-27: qty 1

## 2020-11-27 MED ORDER — BISACODYL 10 MG RE SUPP: 10.0000 mg | Freq: Every day | RECTAL | Status: DC | PRN

## 2020-11-27 NOTE — Progress Notes (Addendum)
Progress Note  2 Days Post-Op  Subjective: Nausea/emesis/abdominal pain the same to worse from yesterday. Emesis exacerbates abdominal pain. Using IV morphine which helps by putting her to sleep. Has not ambulated besides to the bathroom. Voiding. No flatus. No BM  Objective: Vital signs in last 24 hours: Temp:  [98.1 F (36.7 C)-99.4 F (37.4 C)] 98.4 F (36.9 C) (07/30 0457) Pulse Rate:  [93-108] 108 (07/30 0457) Resp:  [18-19] 18 (07/30 0048) BP: (126-149)/(70-92) 142/91 (07/30 0457) SpO2:  [99 %-100 %] 100 % (07/30 0457) Last BM Date:  (not sure)  Intake/Output from previous day: 07/29 0701 - 07/30 0700 In: 3173.1 [P.O.:480; I.V.:1953; IV Piggyback:740.1] Out: 3001 [Urine:1850; Emesis/NG output:1151] Intake/Output this shift: No intake/output data recorded.  PE: General: pleasant, WD, WN female who is laying in bed in NAD HEENT: sclera anicteric Heart: regular, rate, and rhythm.   Lungs: CTAB, no wheezes, rhonchi, or rales noted.  Respiratory effort nonlabored Abd: soft, appropriately ttp, ND, BS hypoactive, incisions c/d/I - no erythema or discharge    Lab Results:  Recent Labs    11/25/20 0652 11/26/20 0538  WBC 8.1 7.1  HGB 10.8* 9.2*  HCT 31.4* 26.6*  PLT 170 120*    BMET Recent Labs    11/25/20 0652 11/26/20 0538  NA 132* 133*  K 2.6* 2.8*  CL 104 105  CO2 21* 20*  GLUCOSE 89 82  BUN 11 8  CREATININE 0.55 0.44  CALCIUM 8.4* 8.0*    PT/INR Recent Labs    11/26/20 0538  LABPROT 15.0  INR 1.2    CMP     Component Value Date/Time   NA 133 (L) 11/26/2020 0538   K 2.8 (L) 11/26/2020 0538   CL 105 11/26/2020 0538   CO2 20 (L) 11/26/2020 0538   GLUCOSE 82 11/26/2020 0538   BUN 8 11/26/2020 0538   CREATININE 0.44 11/26/2020 0538   CALCIUM 8.0 (L) 11/26/2020 0538   PROT 4.4 (L) 11/26/2020 0538   ALBUMIN 2.0 (L) 11/26/2020 0538   AST 202 (H) 11/26/2020 0538   ALT 383 (H) 11/26/2020 0538   ALKPHOS 51 11/26/2020 0538   BILITOT 2.8  (H) 11/26/2020 0538   GFRNONAA >60 11/26/2020 0538   GFRAA >60 04/11/2018 1122   Lipase     Component Value Date/Time   LIPASE 62 (H) 11/26/2020 0538       Studies/Results: US LIVER DOPPLER  Result Date: 11/26/2020 CLINICAL DATA:  Elevated LFTs EXAM: DUPLEX ULTRASOUND OF LIVER TECHNIQUE: Color and duplex Doppler ultrasound was performed to evaluate the hepatic in-flow and out-flow vessels. COMPARISON:  MRCP from previous day FINDINGS: Liver: Normal parenchymal echogenicity. Normal hepatic contour without nodularity. No focal lesion, mass or intrahepatic biliary ductal dilatation. Main Portal Vein size: 1 cm Portal Vein Velocities (all hepatopetal): Main Prox:  22 cm/sec Main Mid: 14 cm/sec Main Dist:  17 cm/sec Right: 12 cm/sec Left: 9 cm/sec Hepatic Vein Velocities (all hepatofugal): Right:  16 cm/sec Middle:  35 cm/sec Left:  38 cm/sec IVC: Present and patent with normal respiratory phasicity. Hepatic Artery Velocity:  88 cm/sec Splenic Vein Velocity:  13 cm/sec Spleen: 10 cm x 4.2 cm x 2.7 cm with a total volume of 59 cm^3 (411 cm^3 is upper limit normal) Portal Vein Occlusion/Thrombus: No Splenic Vein Occlusion/Thrombus: No Ascites: None Varices: None IMPRESSION: Unremarkable hepatic vascular Doppler evaluation. Electronically Signed   By: Corlis Leak M.D.   On: 11/26/2020 04:54    Anti-infectives: Anti-infectives (From admission, onward)  None        Assessment/Plan Cholelithiasis with elevated LFT's Acute cholecystitis  S/p laparoscopic cholecystectomy 11/25/20 Dr. Luisa Hart - POD#2 - LFTs and Tbili downtrending yesterday, pending this AM - pt with significant pain and nausea - again discussed utilization of pain meds and antiemetics - hopefully can move towards PO pain meds - Encouraged ambulation with goal of ambulate in hallway today  - constipation - has not been getting stool softener. PRN miralax Hypokalemia - K 2.8 yesterday now s/p IV replacement. CMP pending Urinary  retention - now urinating. Continue to monitor   FEN - heart healthy VTE - SCDs, okay for chemical ppx from surgery standpoint ID - No current abx  LOS: 0 days    Eric Form, [facility] 11/27/2020, 7:47 AM Please see Amion for pager number during day hours 7:00am-4:30pm

## 2020-11-27 NOTE — Progress Notes (Addendum)
Daily Antepartum Note  Admission Date: 11/24/2020 Current Date: 11/27/2020 6:30 AM  Deborah Evans is a 25 y.o. G3P1011 @ [redacted]w[redacted]d, HD#4/POD#2 l/s chole, admitted for n/v, abnormal GI labs.  Pregnancy complicated by: Patient Active Problem List   Diagnosis Date Noted   Transient hypertension of pregnancy in second trimester 11/27/2020   Elevated LFTs    [redacted] weeks gestation of pregnancy 11/24/2020   Cholelithiasis affecting pregnancy in second trimester, antepartum 11/24/2020   Cholelithiasis complicating pregnancy in second trimester, antepartum 11/24/2020   Encounter for supervision of normal pregnancy, antepartum 10/30/2019   History of hyperemesis gravidarum 05/28/2018   Hypokalemia 11/28/2017    Overnight/24hr events:  none  Subjective:  Persistent nausea; no vomiting, fevers, chills, BMs. +flatus. Not taking any PO.   Objective:    Current Vital Signs 24h Vital Sign Ranges  T 98.4 F (36.9 C) Temp  Avg: 98.5 F (36.9 C)  Min: 98.1 F (36.7 C)  Max: 99.4 F (37.4 C)  BP (!) 142/91  BP  Min: 126/70  Max: 149/91  HR (!) 108  Pulse  Avg: 101.8  Min: 93  Max: 108  RR 18 Resp  Avg: 18.2  Min: 18  Max: 19  SaO2 100 % Room Air SpO2  Avg: 99.5 %  Min: 99 %  Max: 100 %       24 Hour I/O Current Shift I/O  Time Ins Outs 07/29 0701 - 07/30 0700 In: 3173.1 [P.O.:480; I.V.:1953] Out: 3001 [Urine:1850] 07/29 1901 - 07/30 0700 In: 240 [P.O.:240] Out: 1100 [Urine:700]   Patient Vitals for the past 24 hrs:  BP Temp Temp src Pulse Resp SpO2  11/27/20 0457 (!) 142/91 98.4 F (36.9 C) Oral (!) 108 -- 100 %  11/27/20 0048 126/70 98.1 F (36.7 C) Oral (!) 101 18 100 %  11/26/20 1932 (!) 141/92 98.4 F (36.9 C) Oral (!) 105 19 99 %  11/26/20 1611 (!) 149/91 99.4 F (37.4 C) Oral (!) 103 18 99 %  11/26/20 1245 140/84 98.7 F (37.1 C) Oral 93 18 99 %  11/26/20 0742 (!) 143/88 98.2 F (36.8 C) Oral (!) 101 18 100 %   Fetal Heart Tones: 150s  Physical exam: General: patient  hunched over with emesis bag ready Abdomen: rare BS, c/d/I l/s port site incisions with dermabond. Approp ttp, no peritoneal s/s Cardiovascular: S1, S2 normal, no murmur, rub or gallop, regular rate and rhythm Respiratory: CTAB Extremities: no clubbing, cyanosis or edema Skin: Warm and dry.   Medications: Current Facility-Administered Medications  Medication Dose Route Frequency Provider Last Rate Last Admin   acetaminophen (TYLENOL) tablet 1,000 mg  1,000 mg Oral Q6H Diamantina Monks, MD   1,000 mg at 11/27/20 0112   calcium carbonate (TUMS - dosed in mg elemental calcium) chewable tablet 400 mg of elemental calcium  2 tablet Oral Q4H PRN Cornett, Maisie Fus, MD   400 mg of elemental calcium at 11/25/20 0616   cyclobenzaprine (FLEXERIL) tablet 10 mg  10 mg Oral TID PRN Diamantina Monks, MD       docusate sodium (COLACE) capsule 100 mg  100 mg Oral Daily Cornett, Thomas, MD   100 mg at 11/24/20 1945   lactated ringers infusion   Intravenous Continuous Juliet Rude, PA-C 50 mL/hr at 11/26/20 1326 New Bag at 11/26/20 1326   morphine 2 MG/ML injection 2-4 mg  2-4 mg Intravenous Q2H PRN Diamantina Monks, MD   2 mg at 11/27/20 0112   oxyCODONE (  ROXICODONE) 5 MG/5ML solution 5-10 mg  5-10 mg Oral Q4H PRN Diamantina Monks, MD       prenatal multivitamin tablet 1 tablet  1 tablet Oral Q1200 Cornett, Thomas, MD       promethazine (PHENERGAN) 25 mg in sodium chloride 0.9 % 50 mL IVPB  25 mg Intravenous Q6H PRN Cornett, Maisie Fus, MD 200 mL/hr at 11/27/20 0112 25 mg at 11/27/20 0112   scopolamine (TRANSDERM-SCOP) 1 MG/3DAYS 1.5 mg  1 patch Transdermal Q72H Cornett, Maisie Fus, MD   1.5 mg at 11/24/20 1607   thiamine (B-1) injection 100 mg  100 mg Intravenous Daily Cornett, Thomas, MD   100 mg at 11/26/20 1049   zolpidem (AMBIEN) tablet 5 mg  5 mg Oral QHS PRN Harriette Bouillon, MD        Labs:  Pending: hemochromatosis PCR  Recent Labs  Lab 11/24/20 1420 11/25/20 0652 11/26/20 0538  WBC 12.0* 8.1 7.1   HGB 14.2 10.8* 9.2*  HCT 41.7 31.4* 26.6*  PLT 252 170 120*    Recent Labs  Lab 11/24/20 1420 11/25/20 0652 11/25/20 1736 11/26/20 0538  NA 131* 132*  --  133*  K 3.0* 2.6*  --  2.8*  CL 98 104  --  105  CO2 18* 21*  --  20*  BUN 9 11  --  8  CREATININE 0.78 0.55  --  0.44  CALCIUM 9.9 8.4*  --  8.0*  PROT 7.5 5.5* 5.1* 4.4*  BILITOT 4.3* 4.1* 3.4* 2.8*  ALKPHOS 87 64 60 51  ALT 694* 530* 507* 383*  AST 362* 246* 261* 202*  GLUCOSE 106* 89  --  82   Results for Deborah, Evans (MRN [id number]) as of 11/27/2020 07:38  Ref. Range 10/29/2020 15:22 11/19/2020 20:36 11/24/2020 14:20 11/26/2020 05:38  Lipase Latest Ref Range: 11 - 51 U/L 34 52 (H) 85 (H) 62 (H)    Radiology:  No new imaging  Assessment & Plan:  Worsened n/v *Pregnancy: fetal status reassuring. No OB issues. Continue with qday fetal heart tones *?chronic HTN: only during hospitalization. I don't see any e/o overt cHTN based on prior HTNs. 24h urine protein ordered *PPx: SCDs. Ask gen surg about ppx lovenox (okay from OB standpoint)  *FEN/GI: f/u am labs. GI signed off; f/u pending hemochormatosis lab and gallbladder pathology. Pt only on regular LR *Pain: per gen surgery *Dispo: per gen surgery  Cornelia Copa MD Attending Center for [facility]) GYN Consult Phone: [phone] (M-F, [phone]) & [phone]  (Off hours, weekends, holidays)

## 2020-11-28 DIAGNOSIS — Z3A15 15 weeks gestation of pregnancy: Secondary | ICD-10-CM | POA: Diagnosis not present

## 2020-11-28 DIAGNOSIS — K8020 Calculus of gallbladder without cholecystitis without obstruction: Secondary | ICD-10-CM | POA: Diagnosis not present

## 2020-11-28 DIAGNOSIS — O26612 Liver and biliary tract disorders in pregnancy, second trimester: Secondary | ICD-10-CM | POA: Diagnosis not present

## 2020-11-28 LAB — COMPREHENSIVE METABOLIC PANEL
ALT: 328 U/L — ABNORMAL HIGH (ref 0–44)
AST: 193 U/L — ABNORMAL HIGH (ref 15–41)
Albumin: 2.2 g/dL — ABNORMAL LOW (ref 3.5–5.0)
Alkaline Phosphatase: 64 U/L (ref 38–126)
Anion gap: 13 (ref 5–15)
BUN: <5 mg/dL — ABNORMAL LOW (ref 6–20)
CO2: 17 mmol/L — ABNORMAL LOW (ref 22–32)
Calcium: 8 mg/dL — ABNORMAL LOW (ref 8.9–10.3)
Chloride: 102 mmol/L (ref 98–111)
Creatinine, Ser: 0.5 mg/dL (ref 0.44–1.00)
GFR, Estimated: >60 mL/min (ref >60)
Glucose, Bld: 67 mg/dL — ABNORMAL LOW (ref 70–99)
Potassium: 3.7 mmol/L (ref 3.5–5.1)
Sodium: 132 mmol/L — ABNORMAL LOW (ref 135–145)
Total Bilirubin: 2.3 mg/dL — ABNORMAL HIGH (ref 0.3–1.2)
Total Protein: 4.7 g/dL — ABNORMAL LOW (ref 6.5–8.1)

## 2020-11-28 LAB — CREATININE, URINE, 24 HOUR
Collection Interval-UCRE24: 24 hours
Creatinine, 24H Ur: 363 mg/d — ABNORMAL LOW (ref 600–1800)
Creatinine, Urine: 72.58 mg/dL
Urine Total Volume-UCRE24: 500 mL

## 2020-11-28 LAB — LIPASE, BLOOD: Lipase: 48 U/L (ref 11–51)

## 2020-11-28 LAB — PROTEIN, URINE, 24 HOUR
Collection Interval-UPROT: 24 hours
Protein, Urine: 49 mg/dL
Urine Total Volume-UPROT: 500 mL

## 2020-11-28 LAB — MITOCHONDRIAL ANTIBODIES: Mitochondrial M2 Ab, IgG: <20 Units (ref 0.0–20.0)

## 2020-11-28 LAB — ANTI-SMOOTH MUSCLE ANTIBODY, IGG: F-Actin IgG: 4 Units (ref 0–19)

## 2020-11-28 NOTE — Progress Notes (Signed)
Patient ID: Deborah Evans, female   DOB: 1995-05-12, 25 y.o.   MRN: [id number] FACULTY PRACTICE ANTEPARTUM(COMPREHENSIVE) NOTE  Deborah Evans is a 25 y.o. G3P1011 at [redacted]w[redacted]d by LMP who is admitted for nausea and vomiting and cholelithiasis, POD 3 after cholecystectomy.   Fetal presentation is unsure. Length of Stay:  1  Days  Subjective: Abdominal pain nausea and vomiting Patient reports the fetal movement as  none . Patient reports uterine contraction  activity as none. Patient reports  vaginal bleeding as none. Patient describes fluid per vagina as None.  Vitals:  Blood pressure (!) 151/87, pulse (!) 104, temperature 98.4 F (36.9 C), temperature source Oral, resp. rate 15, height 5\' 8"  (1.727 m), weight 60.8 kg, last menstrual period 08/10/2020, SpO2 98 %, unknown if currently breastfeeding. Physical Examination:  General appearance - oriented to person, place, and time and ill-appearing Heart - normal rate and regular rhythm Abdomen - soft, nontender, nondistended Fundal Height:  size equals dates Extremities: extremities normal, atraumatic, no cyanosis or edema and Homans sign is negative, no sign of DVT  Membranes:intact  Fetal Monitoring:   Fetal Heart Rate A   Mode Doppler filed at 11/28/2020 11/30/2020  Baseline Rate (A) 150 bpm filed at 11/28/2020 11/30/2020    Labs:  Results for orders placed or performed during the hospital encounter of 11/24/20 (from the past 24 hour(s))  CBC   Collection Time: 11/27/20  8:18 AM  Result Value Ref Range   WBC 7.0 4.0 - 10.5 K/uL   RBC 3.76 (L) 3.87 - 5.11 MIL/uL   Hemoglobin 10.0 (L) 12.0 - 15.0 g/dL   HCT 11/29/20 (L) 71.2 - 45.8 %   MCV 79.8 (L) 80.0 - 100.0 fL   MCH 26.6 26.0 - 34.0 pg   MCHC 33.3 30.0 - 36.0 g/dL   RDW 09.9 83.3 - 82.5 %   Platelets 127 (L) 150 - 400 K/uL   nRBC 0.0 0.0 - 0.2 %  Magnesium   Collection Time: 11/27/20  8:18 AM  Result Value Ref Range   Magnesium 1.6 (L) 1.7 - 2.4 mg/dL  Comprehensive metabolic panel    Collection Time: 11/27/20  8:18 AM  Result Value Ref Range   Sodium 133 (L) 135 - 145 mmol/L   Potassium 3.3 (L) 3.5 - 5.1 mmol/L   Chloride 104 98 - 111 mmol/L   CO2 22 22 - 32 mmol/L   Glucose, Bld 78 70 - 99 mg/dL   BUN <5 (L) 6 - 20 mg/dL   Creatinine, Ser 11/29/20 0.44 - 1.00 mg/dL   Calcium 7.9 (L) 8.9 - 10.3 mg/dL   Total Protein 4.9 (L) 6.5 - 8.1 g/dL   Albumin 2.2 (L) 3.5 - 5.0 g/dL   AST 9.76 (H) 15 - 41 U/L   ALT 369 (H) 0 - 44 U/L   Alkaline Phosphatase 62 38 - 126 U/L   Total Bilirubin 2.6 (H) 0.3 - 1.2 mg/dL   GFR, Estimated 734 >19 mL/min   Anion gap 7 5 - 15  Lipase, blood   Collection Time: 11/27/20  8:18 AM  Result Value Ref Range   Lipase 47 11 - 51 U/L     Medications:  Scheduled  bisacodyl  10 mg Rectal Once   docusate sodium  100 mg Oral Daily   enoxaparin (LOVENOX) injection  40 mg Subcutaneous Q24H   prenatal multivitamin  1 tablet Oral Q1200   scopolamine  1 patch Transdermal Q72H   thiamine injection  100  mg Intravenous Daily   I have reviewed the patient's current medications.  ASSESSMENT: Patient Active Problem List   Diagnosis Date Noted   Transient hypertension of pregnancy in second trimester 11/27/2020   Elevated LFTs    [redacted] weeks gestation of pregnancy 11/24/2020   Cholelithiasis affecting pregnancy in second trimester, antepartum 11/24/2020   Cholelithiasis complicating pregnancy in second trimester, antepartum 11/24/2020   Encounter for supervision of normal pregnancy, antepartum 10/30/2019   History of hyperemesis gravidarum 05/28/2018   Hypokalemia 11/28/2017    PLAN: Will follow this general surgery. No OB complications aside from nausea. Will follow BP as she may have CHTN. Disposition per general surgery. Daily Lovenox is ordered for DVT prophylaxis  Scheryl Darter 11/28/2020,7:44 AM

## 2020-11-28 NOTE — Progress Notes (Signed)
   Progress Note  3 Days Post-Op  Subjective: Stable nausea/emesis/abdominal pain. Active emesis at time of morning rounds. Void x1 yesterday. Last BM PTA. Ambulated in hallway. Tried oral pain meds but still requiring IV  Objective: Vital signs in last 24 hours: Temp:  [98 F (36.7 C)-99 F (37.2 C)] 98.4 F (36.9 C) (07/31 0617) Pulse Rate:  [93-104] 104 (07/31 0617) Resp:  [15-18] 15 (07/31 0617) BP: (135-151)/(79-87) 151/87 (07/31 0617) SpO2:  [98 %-100 %] 98 % (07/31 0617) Weight:  [60.8 kg] 60.8 kg (07/31 0617) Last BM Date:  (not sure)  Intake/Output from previous day: 07/30 0701 - 07/31 0700 In: 2143.9 [P.O.:110; I.V.:1333.8; IV Piggyback:700.1] Out: 2775 [Urine:1300; Emesis/NG output:1475] Intake/Output this shift: No intake/output data recorded.  PE: General: WD, WN female who is sitting up in bed actively having emesis HEENT: sclera anicteric Heart: palpable radial pulses. No cyanosis Lungs: Respiratory effort nonlabored Abd: soft, appropriately ttp, ND, BS hypoactive   Lab Results:  Recent Labs    11/26/20 0538 11/27/20 0818  WBC 7.1 7.0  HGB 9.2* 10.0*  HCT 26.6* 30.0*  PLT 120* 127*    BMET Recent Labs    11/26/20 0538 11/27/20 0818  NA 133* 133*  K 2.8* 3.3*  CL 105 104  CO2 20* 22  GLUCOSE 82 78  BUN 8 <5*  CREATININE 0.44 0.55  CALCIUM 8.0* 7.9*    PT/INR Recent Labs    11/26/20 0538  LABPROT 15.0  INR 1.2    CMP     Component Value Date/Time   NA 133 (L) 11/27/2020 0818   K 3.3 (L) 11/27/2020 0818   CL 104 11/27/2020 0818   CO2 22 11/27/2020 0818   GLUCOSE 78 11/27/2020 0818   BUN <5 (L) 11/27/2020 0818   CREATININE 0.55 11/27/2020 0818   CALCIUM 7.9 (L) 11/27/2020 0818   PROT 4.9 (L) 11/27/2020 0818   ALBUMIN 2.2 (L) 11/27/2020 0818   AST 185 (H) 11/27/2020 0818   ALT 369 (H) 11/27/2020 0818   ALKPHOS 62 11/27/2020 0818   BILITOT 2.6 (H) 11/27/2020 0818   GFRNONAA >60 11/27/2020 0818   GFRAA >60 04/11/2018 1122    Lipase     Component Value Date/Time   LIPASE 47 11/27/2020 0818       Studies/Results: No results found.  Anti-infectives: Anti-infectives (From admission, onward)    None        Assessment/Plan Cholelithiasis with elevated LFT's Acute cholecystitis  S/p laparoscopic cholecystectomy 11/25/20 Dr. Luisa Hart - POD#3 - lipase normal - LFTs and Tbili downtrending, pending this AM - pt with significant nausea - did have N/V prior to OR - Continue ambulation  - constipation - stool softener, miralax prn. Prn suppository - she does not want to try it at this point Hypokalemia - K 3.3 yesterday s/p IV replacement. CMP pending Urinary retention - now urinating. Continue to monitor   FEN - heart healthy VTE - SCDs, lovenox ID - No current abx  LOS: 1 day    Eric Form, [facility] 11/28/2020, 7:18 AM Please see Amion for pager number during day hours 7:00am-4:30pm

## 2020-11-29 ENCOUNTER — Encounter (HOSPITAL_COMMUNITY): Payer: Self-pay | Admitting: Surgery

## 2020-11-29 DIAGNOSIS — R7989 Other specified abnormal findings of blood chemistry: Secondary | ICD-10-CM | POA: Diagnosis not present

## 2020-11-29 DIAGNOSIS — K8020 Calculus of gallbladder without cholecystitis without obstruction: Secondary | ICD-10-CM | POA: Diagnosis not present

## 2020-11-29 DIAGNOSIS — O26612 Liver and biliary tract disorders in pregnancy, second trimester: Secondary | ICD-10-CM | POA: Diagnosis not present

## 2020-11-29 DIAGNOSIS — Z3A15 15 weeks gestation of pregnancy: Secondary | ICD-10-CM | POA: Diagnosis not present

## 2020-11-29 LAB — COMPREHENSIVE METABOLIC PANEL
ALT: 375 U/L — ABNORMAL HIGH (ref 0–44)
AST: 247 U/L — ABNORMAL HIGH (ref 15–41)
Albumin: 2.3 g/dL — ABNORMAL LOW (ref 3.5–5.0)
Alkaline Phosphatase: 77 U/L (ref 38–126)
Anion gap: 10 (ref 5–15)
BUN: <5 mg/dL — ABNORMAL LOW (ref 6–20)
CO2: 23 mmol/L (ref 22–32)
Calcium: 8.4 mg/dL — ABNORMAL LOW (ref 8.9–10.3)
Chloride: 101 mmol/L (ref 98–111)
Creatinine, Ser: 0.43 mg/dL — ABNORMAL LOW (ref 0.44–1.00)
GFR, Estimated: >60 mL/min (ref >60)
Glucose, Bld: 76 mg/dL (ref 70–99)
Potassium: 3.7 mmol/L (ref 3.5–5.1)
Sodium: 134 mmol/L — ABNORMAL LOW (ref 135–145)
Total Bilirubin: 2.4 mg/dL — ABNORMAL HIGH (ref 0.3–1.2)
Total Protein: 5.3 g/dL — ABNORMAL LOW (ref 6.5–8.1)

## 2020-11-29 LAB — SURGICAL PATHOLOGY

## 2020-11-29 MED ORDER — ONDANSETRON 4 MG PO TBDP
8.0000 mg | ORAL_TABLET | Freq: Three times a day (TID) | ORAL | Status: DC
  Administered 2020-11-29 – 2020-11-30 (×3): 8 mg via ORAL
  Filled 2020-11-29 (×4): qty 2

## 2020-11-29 MED ORDER — FUROSEMIDE 10 MG/ML IJ SOLN
10.0000 mg | Freq: Once | INTRAMUSCULAR | Status: AC
  Administered 2020-11-29: 10 mg via INTRAVENOUS
  Filled 2020-11-29: qty 2

## 2020-11-29 MED ORDER — MAGNESIUM OXIDE -MG SUPPLEMENT 400 (240 MG) MG PO TABS
400.0000 mg | ORAL_TABLET | Freq: Two times a day (BID) | ORAL | Status: DC
  Administered 2020-11-29 – 2020-11-30 (×3): 400 mg via ORAL
  Filled 2020-11-29 (×3): qty 1

## 2020-11-29 MED ORDER — PROMETHAZINE HCL 25 MG PO TABS: 12.5000 mg | ORAL_TABLET | Freq: Four times a day (QID) | ORAL | Status: DC | PRN

## 2020-11-29 MED ORDER — METOCLOPRAMIDE HCL 10 MG PO TABS
10.0000 mg | ORAL_TABLET | Freq: Three times a day (TID) | ORAL | Status: DC
  Administered 2020-11-29 – 2020-11-30 (×4): 10 mg via ORAL
  Filled 2020-11-29 (×4): qty 1

## 2020-11-29 MED ORDER — PANTOPRAZOLE SODIUM 40 MG IV SOLR
40.0000 mg | Freq: Two times a day (BID) | INTRAVENOUS | Status: DC
  Administered 2020-11-29: 40 mg via INTRAVENOUS
  Filled 2020-11-29: qty 40

## 2020-11-29 MED ORDER — PANTOPRAZOLE SODIUM 40 MG PO TBEC
40.0000 mg | DELAYED_RELEASE_TABLET | Freq: Two times a day (BID) | ORAL | Status: DC
  Administered 2020-11-29 – 2020-11-30 (×2): 40 mg via ORAL
  Filled 2020-11-29 (×2): qty 1

## 2020-11-29 MED ORDER — SODIUM CHLORIDE 0.9 % IV SOLN
8.0000 mg | Freq: Three times a day (TID) | INTRAVENOUS | Status: DC
  Administered 2020-11-29: 8 mg via INTRAVENOUS
  Filled 2020-11-29 (×3): qty 4

## 2020-11-29 NOTE — Plan of Care (Signed)
  Problem: Activity: Goal: Risk for activity intolerance will decrease Outcome: Completed/Met   Problem: Nutrition: Goal: Adequate nutrition will be maintained Outcome: Completed/Met   Problem: Coping: Goal: Level of anxiety will decrease Outcome: Completed/Met   Problem: Elimination: Goal: Will not experience complications related to urinary retention Outcome: Completed/Met   Problem: Pain Managment: Goal: General experience of comfort will improve Outcome: Completed/Met   Problem: Education: Goal: Knowledge of disease or condition will improve Outcome: Completed/Met Goal: Knowledge of the prescribed therapeutic regimen will improve Outcome: Completed/Met   Problem: Education: Goal: Knowledge of disease or condition will improve Outcome: Completed/Met Goal: Knowledge of the prescribed therapeutic regimen will improve Outcome: Completed/Met   Problem: Bowel/Gastric: Goal: Occurences of nausea and/or vomiting will decrease Outcome: Completed/Met   Problem: Clinical Measurements: Goal: Respiratory complications will improve Outcome: Not Applicable

## 2020-11-29 NOTE — Progress Notes (Signed)
Patient ID: Deborah Evans, female   DOB: 1995-07-21, 25 y.o.   MRN: [id number] ACULTY PRACTICE ANTEPARTUM COMPREHENSIVE PROGRESS NOTE  Deborah Evans is a 25 y.o. G3P1011 at [redacted]w[redacted]d  HD # 6/POD # 4 lap chole, N/V and elevated LFT's    Subjective: Ms Deborah Evans reports feeling better this morning. Tolerating regular diet. Still some N/V mainly in the evening. Up to restroom without problems.   Vitals:  Blood pressure 133/83, pulse 92, temperature 98.4 F (36.9 C), temperature source Oral, resp. rate 18, height 5\' 8"  (1.727 m), weight 60.8 kg, last menstrual period 08/10/2020, SpO2 99 %, unknown if currently breastfeeding. Physical Examination: Lungs clear, Heart RRR Abd osft + BS, incisions, CDI, gravid Ext non tender  Fetal Monitoring:  + FHT's  Labs:  Results for orders placed or performed during the hospital encounter of 11/24/20 (from the past 24 hour(s))  Comprehensive metabolic panel   Collection Time: 11/29/20  8:17 AM  Result Value Ref Range   Sodium 134 (L) 135 - 145 mmol/L   Potassium 3.7 3.5 - 5.1 mmol/L   Chloride 101 98 - 111 mmol/L   CO2 23 22 - 32 mmol/L   Glucose, Bld 76 70 - 99 mg/dL   BUN <5 (L) 6 - 20 mg/dL   Creatinine, Ser 01/29/21 (L) 0.44 - 1.00 mg/dL   Calcium 8.4 (L) 8.9 - 10.3 mg/dL   Total Protein 5.3 (L) 6.5 - 8.1 g/dL   Albumin 2.3 (L) 3.5 - 5.0 g/dL   AST 5.09 (H) 15 - 41 U/L   ALT 375 (H) 0 - 44 U/L   Alkaline Phosphatase 77 38 - 126 U/L   Total Bilirubin 2.4 (H) 0.3 - 1.2 mg/dL   GFR, Estimated 326 >71 mL/min   Anion gap 10 5 - 15    Imaging Studies:    NA   Medications:  Scheduled  bisacodyl  10 mg Rectal Once   docusate sodium  100 mg Oral Daily   enoxaparin (LOVENOX) injection  40 mg Subcutaneous Q24H   furosemide  10 mg Intravenous Once   magnesium oxide  400 mg Oral BID   metoCLOPramide  10 mg Oral TID AC & HS   ondansetron  8 mg Oral Q8H   pantoprazole  40 mg Oral BID AC   prenatal multivitamin  1 tablet Oral Q1200   scopolamine  1  patch Transdermal Q72H   thiamine injection  100 mg Intravenous Daily   I have reviewed the patient's current medications.  ASSESSMENT: Stable.   PLAN: Will adjust antiemetic medications for possible discharge home tomorrow. Replace magnesium. Lasix x 1 for decreased UOP. Repeat LFT's tomorrow.  Continue routine antenatal care.   >24 11/29/2020,10:46 AM

## 2020-11-29 NOTE — Progress Notes (Signed)
Patient with low urinary output (400 ml) over last 24 hours.  CMP results from this morning:   Results for Deborah Evans, Deborah Evans (MRN [id number]) as of 11/29/2020 10:20  Ref. Range 11/29/2020 08:17  BUN Latest Ref Range: 6 - 20 mg/dL <5 (L)  Creatinine Latest Ref Range: 0.44 - 1.00 mg/dL 8.97 (L)  Results for Deborah Evans, Deborah Evans (MRN [id number]) as of 11/29/2020 10:20  Ref. Range 11/29/2020 08:17  AST Latest Ref Range: 15 - 41 U/L 247 (H)  ALT Latest Ref Range: 0 - 44 U/L 375 (H)   Phoned Dr. Alysia Penna with above information.  He plans to write new orders to address this.  Will await orders.

## 2020-11-29 NOTE — Progress Notes (Signed)
Patient is surgically stable.  Defer to [facility] when patient safe for DC from a hyperemesis standpoint.  Follow up arranged for this patient.  Will check on her tomorrow if she remains inpatient.  Deborah Evans 9:27 AM 11/29/2020

## 2020-11-30 ENCOUNTER — Ambulatory Visit: Payer: Medicaid Other | Admitting: General Surgery

## 2020-11-30 DIAGNOSIS — K8020 Calculus of gallbladder without cholecystitis without obstruction: Secondary | ICD-10-CM | POA: Diagnosis not present

## 2020-11-30 DIAGNOSIS — R7989 Other specified abnormal findings of blood chemistry: Secondary | ICD-10-CM | POA: Diagnosis not present

## 2020-11-30 DIAGNOSIS — O26612 Liver and biliary tract disorders in pregnancy, second trimester: Secondary | ICD-10-CM | POA: Diagnosis not present

## 2020-11-30 DIAGNOSIS — Z3A15 15 weeks gestation of pregnancy: Secondary | ICD-10-CM | POA: Diagnosis not present

## 2020-11-30 LAB — COMPREHENSIVE METABOLIC PANEL
ALT: 331 U/L — ABNORMAL HIGH (ref 0–44)
AST: 214 U/L — ABNORMAL HIGH (ref 15–41)
Albumin: 2 g/dL — ABNORMAL LOW (ref 3.5–5.0)
Alkaline Phosphatase: 71 U/L (ref 38–126)
Anion gap: 6 (ref 5–15)
BUN: <5 mg/dL — ABNORMAL LOW (ref 6–20)
CO2: 25 mmol/L (ref 22–32)
Calcium: 8.3 mg/dL — ABNORMAL LOW (ref 8.9–10.3)
Chloride: 102 mmol/L (ref 98–111)
Creatinine, Ser: 0.41 mg/dL — ABNORMAL LOW (ref 0.44–1.00)
GFR, Estimated: >60 mL/min (ref >60)
Glucose, Bld: 84 mg/dL (ref 70–99)
Potassium: 2.9 mmol/L — ABNORMAL LOW (ref 3.5–5.1)
Sodium: 133 mmol/L — ABNORMAL LOW (ref 135–145)
Total Bilirubin: 2 mg/dL — ABNORMAL HIGH (ref 0.3–1.2)
Total Protein: 4.9 g/dL — ABNORMAL LOW (ref 6.5–8.1)

## 2020-11-30 MED ORDER — SCOPOLAMINE 1 MG/3DAYS TD PT72: 1.0000 | MEDICATED_PATCH | TRANSDERMAL | 12 refills | Status: DC

## 2020-11-30 MED ORDER — OXYCODONE HCL 5 MG/5ML PO SOLN: 5.0000 mg | ORAL | 0 refills | Status: DC | PRN

## 2020-11-30 MED ORDER — POTASSIUM CHLORIDE 10 MEQ/100ML IV SOLN
10.0000 meq | INTRAVENOUS | Status: AC
Start: 2020-11-30 — End: 2020-11-30
  Administered 2020-11-30 (×2): 10 meq via INTRAVENOUS
  Filled 2020-11-30 (×2): qty 100

## 2020-11-30 MED ORDER — POTASSIUM CHLORIDE CRYS ER 20 MEQ PO TBCR: 20.0000 meq | EXTENDED_RELEASE_TABLET | Freq: Two times a day (BID) | ORAL | 1 refills | Status: DC

## 2020-11-30 MED ORDER — METOCLOPRAMIDE HCL 10 MG PO TABS: 10.0000 mg | ORAL_TABLET | Freq: Three times a day (TID) | ORAL | 2 refills | Status: DC

## 2020-11-30 NOTE — Discharge Summary (Signed)
Patient ID: Deborah Evans MRN: [id number] DOB/AGE: 10-11-1995 25 y.o.  Admit date: 11/24/2020 Discharge date: 11/30/2020  Admission Diagnoses:Hyperemesis and cholelithiasis  Discharge Diagnoses: SAA  Prenatal Procedures: none  Consults: General Surgery and GI  Hospital Course:  This is a 25 y.o. G3P1011 with IUP at [redacted]w[redacted]d admitted for above mentioned Dx. She underwent Lap cholestectomy by General surgery on .11/25/20. See their OP note for additional information.  Pt's Sx slowly improved to were she was having  little to N/V and tolerating regular diet. She did require several IV replacements for hypokalemia, and was discharged home on oral replacement GI was consulted due to her elevated LFT's but they were decreasing at time of discharge, She had daily FHT's which remained positive and reassuring.  She was deemed stable for discharge to home with outpatient follow up.  Discharge Exam: Temp:  [97.9 F (36.6 C)-99 F (37.2 C)] 98.3 F (36.8 C) (08/02 0818) Pulse Rate:  [91-111] 95 (08/02 0818) Resp:  [15-18] 17 (08/02 0818) BP: (119-144)/(61-86) 135/85 (08/02 0818) SpO2:  [96 %-100 %] 99 % (08/02 0818) Physical Examination: CONSTITUTIONAL: Well-developed, well-nourished female in no acute distress.  HENT:  Normocephalic, atraumatic, External right and left ear normal. Oropharynx is clear and moist EYES: Conjunctivae and EOM are normal. Pupils are equal, round, and reactive to light. No scleral icterus.  NECK: Normal range of motion, supple, no masses SKIN: Skin is warm and dry. No rash noted. Not diaphoretic. No erythema. No pallor. NEUROLGIC: Alert and oriented to person, place, and time. Normal reflexes, muscle tone coordination. No cranial nerve deficit noted. PSYCHIATRIC: Normal mood and affect. Normal behavior. Normal judgment and thought content. CARDIOVASCULAR: Normal heart rate noted, regular rhythm RESPIRATORY: Effort and breath sounds normal, no problems with respiration  noted MUSCULOSKELETAL: Normal range of motion. No edema and no tenderness. 2+ distal pulses. ABDOMEN: Soft, nontender, nondistended, gravid.  Fetal monitoring: FHR: 140 bpm,  Significant Diagnostic Studies:  Results for orders placed or performed during the hospital encounter of 11/24/20 (from the past 168 hour(s))  Urinalysis, Routine w reflex microscopic Urine, Clean Catch   Collection Time: 11/24/20  2:20 PM  Result Value Ref Range   Color, Urine AMBER (A) YELLOW   APPearance HAZY (A) CLEAR   Specific Gravity, Urine 1.024 1.005 - 1.030   pH 6.0 5.0 - 8.0   Glucose, UA NEGATIVE NEGATIVE mg/dL   Hgb urine dipstick NEGATIVE NEGATIVE   Bilirubin Urine SMALL (A) NEGATIVE   Ketones, ur 80 (A) NEGATIVE mg/dL   Protein, ur 829 (A) NEGATIVE mg/dL   Nitrite NEGATIVE NEGATIVE   Leukocytes,Ua TRACE (A) NEGATIVE   RBC / HPF 0-5 0 - 5 RBC/hpf   WBC, UA 11-20 0 - 5 WBC/hpf   Bacteria, UA RARE (A) NONE SEEN   Squamous Epithelial / LPF 0-5 0 - 5   Mucus PRESENT    Hyaline Casts, UA PRESENT    Granular Casts, UA PRESENT   CBC   Collection Time: 11/24/20  2:20 PM  Result Value Ref Range   WBC 12.0 (H) 4.0 - 10.5 K/uL   RBC 5.41 (H) 3.87 - 5.11 MIL/uL   Hemoglobin 14.2 12.0 - 15.0 g/dL   HCT 56.2 13.0 - 86.5 %   MCV 77.1 (L) 80.0 - 100.0 fL   MCH 26.2 26.0 - 34.0 pg   MCHC 34.1 30.0 - 36.0 g/dL   RDW 78.4 69.6 - 29.5 %   Platelets 252 150 - 400 K/uL   nRBC 0.0  0.0 - 0.2 %  Comprehensive metabolic panel   Collection Time: 11/24/20  2:20 PM  Result Value Ref Range   Sodium 131 (L) 135 - 145 mmol/L   Potassium 3.0 (L) 3.5 - 5.1 mmol/L   Chloride 98 98 - 111 mmol/L   CO2 18 (L) 22 - 32 mmol/L   Glucose, Bld 106 (H) 70 - 99 mg/dL   BUN 9 6 - 20 mg/dL   Creatinine, Ser 9.73 0.44 - 1.00 mg/dL   Calcium 9.9 8.9 - 53.2 mg/dL   Total Protein 7.5 6.5 - 8.1 g/dL   Albumin 3.4 (L) 3.5 - 5.0 g/dL   AST 992 (H) 15 - 41 U/L   ALT 694 (H) 0 - 44 U/L   Alkaline Phosphatase 87 38 - 126 U/L    Total Bilirubin 4.3 (H) 0.3 - 1.2 mg/dL   GFR, Estimated >42 >68 mL/min   Anion gap 15 5 - 15  Lipase, blood   Collection Time: 11/24/20  2:20 PM  Result Value Ref Range   Lipase 85 (H) 11 - 51 U/L  Resp Panel by RT-PCR (Flu A&B, Covid) Nasopharyngeal Swab   Collection Time: 11/24/20  4:45 PM   Specimen: Nasopharyngeal Swab; Nasopharyngeal(NP) swabs in vial transport medium  Result Value Ref Range   SARS Coronavirus 2 by RT PCR NEGATIVE NEGATIVE   Influenza A by PCR NEGATIVE NEGATIVE   Influenza B by PCR NEGATIVE NEGATIVE  Type and screen [facility]   Collection Time: 11/24/20  4:45 PM  Result Value Ref Range   ABO/RH(D) O POS    Antibody Screen NEG    Sample Expiration      11/27/2020,2359 Performed at [facility], [address], [place], [place] [address]   CBC   Collection Time: 11/25/20  6:52 AM  Result Value Ref Range   WBC 8.1 4.0 - 10.5 K/uL   RBC 4.07 3.87 - 5.11 MIL/uL   Hemoglobin 10.8 (L) 12.0 - 15.0 g/dL   HCT 22.2 (L) 97.9 - 89.2 %   MCV 77.1 (L) 80.0 - 100.0 fL   MCH 26.5 26.0 - 34.0 pg   MCHC 34.4 30.0 - 36.0 g/dL   RDW 11.9 41.7 - 40.8 %   Platelets 170 150 - 400 K/uL   nRBC 0.0 0.0 - 0.2 %  Comprehensive metabolic panel   Collection Time: 11/25/20  6:52 AM  Result Value Ref Range   Sodium 132 (L) 135 - 145 mmol/L   Potassium 2.6 (LL) 3.5 - 5.1 mmol/L   Chloride 104 98 - 111 mmol/L   CO2 21 (L) 22 - 32 mmol/L   Glucose, Bld 89 70 - 99 mg/dL   BUN 11 6 - 20 mg/dL   Creatinine, Ser 1.44 0.44 - 1.00 mg/dL   Calcium 8.4 (L) 8.9 - 10.3 mg/dL   Total Protein 5.5 (L) 6.5 - 8.1 g/dL   Albumin 2.4 (L) 3.5 - 5.0 g/dL   AST 818 (H) 15 - 41 U/L   ALT 530 (H) 0 - 44 U/L   Alkaline Phosphatase 64 38 - 126 U/L   Total Bilirubin 4.1 (H) 0.3 - 1.2 mg/dL   GFR, Estimated >56 >31 mL/min   Anion gap 7 5 - 15  Surgical pathology   Collection Time: 11/25/20  1:40 PM  Result Value Ref Range   SURGICAL PATHOLOGY      SURGICAL  PATHOLOGY CASE: [id number] PATIENT: [facility] Surgical Pathology Report  Clinical History: cholecystitis (cm)     FINAL MICROSCOPIC DIAGNOSIS:  A. GALLBLADDER, CHOLECYSTECTOMY: - Chronic cholecystitis     GROSS DESCRIPTION:  Specimen: Gallbladder, received fresh Size/?Intact: 7 x 2.6 x 1.1 cm, intact Serosal surface: Yellow-pink, smooth Mucosa/Wall: The mucosa is yellow-green to dark green, smooth, soft, with scattered faint yellow streaking.  The wall is up to 0.3 cm thick. Contents: Bile, no gross stones. Cystic duct: Patent Block Summary: Representative sections in 1 block.  SW 11/26/2020    Final Diagnosis performed by Holley Bouche, MD.   Electronically signed 11/29/2020 Technical component performed at [facility], [address], [place], [place] [address].  Professional component performed at [facility], [address] [place], [place] [address].  Immunohistochemistry Technical  component (if applicable) was performed at [facility]. [address], [address], [place], [place] [address].   IMMUNOHISTOCHEMISTRY DISCLAIMER (if applicable): Some of these immunohistochemical stains may have been developed and the performance characteristics determine by [facility]. Some may not have been cleared or approved by the U.S. Food and Drug Administration. The FDA has determined that such clearance or approval is not necessary. This test is used for clinical purposes. It should not be regarded as investigational or for research. This laboratory is certified under the Clinical Laboratory Improvement Amendments of 1988 (CLIA-88) as qualified to perform high complexity clinical laboratory testing.  The controls stained appropriately.   CMV IgM   Collection Time: 11/25/20  5:36 PM  Result Value Ref Range   CMV IgM <30.0 0.0 - 29.9 AU/mL  Ceruloplasmin   Collection Time: 11/25/20  5:36  PM  Result Value Ref Range   Ceruloplasmin 49.7 (H) 19.0 - 39.0 mg/dL  HSV(herpes simplex vrs) 1+2 ab-IgM   Collection Time: 11/25/20  5:36 PM  Result Value Ref Range   HSVI/II Comb IgM <0.91 0.00 - 0.90 Ratio  Hepatitis panel, acute   Collection Time: 11/25/20  5:36 PM  Result Value Ref Range   Hepatitis B Surface Ag NON REACTIVE NON REACTIVE   HCV Ab NON REACTIVE NON REACTIVE   Hep A IgM NON REACTIVE NON REACTIVE   Hep B C IgM NON REACTIVE NON REACTIVE  Mitochondrial antibodies   Collection Time: 11/25/20  5:36 PM  Result Value Ref Range   Mitochondrial M2 Ab, IgG <20.0 0.0 - 20.0 Units  Anti-smooth muscle antibody, IgG   Collection Time: 11/25/20  5:36 PM  Result Value Ref Range   F-Actin IgG 4 0 - 19 Units  IgG   Collection Time: 11/25/20  5:36 PM  Result Value Ref Range   IgG (Immunoglobin G), Serum 667 586 - 1,602 mg/dL  Vitamin B14   Collection Time: 11/25/20  5:36 PM  Result Value Ref Range   Vitamin B-12 465 180 - 914 pg/mL  Iron and TIBC   Collection Time: 11/25/20  5:36 PM  Result Value Ref Range   Iron 183 (H) 28 - 170 ug/dL   TIBC 782 956 - 213 ug/dL   Saturation Ratios 73 (H) 10.4 - 31.8 %   UIBC 68 ug/dL  Ferritin   Collection Time: 11/25/20  5:36 PM  Result Value Ref Range   Ferritin 282 11 - 307 ng/mL  Hepatic function panel   Collection Time: 11/25/20  5:36 PM  Result Value Ref Range   Total Protein 5.1 (L) 6.5 - 8.1 g/dL   Albumin 2.4 (L) 3.5 - 5.0 g/dL   AST 086 (H) 15 -  41 U/L   ALT 507 (H) 0 - 44 U/L   Alkaline Phosphatase 60 38 - 126 U/L   Total Bilirubin 3.4 (H) 0.3 - 1.2 mg/dL   Bilirubin, Direct 1.4 (H) 0.0 - 0.2 mg/dL   Indirect Bilirubin 2.0 (H) 0.3 - 0.9 mg/dL  Folate   Collection Time: 11/25/20  5:37 PM  Result Value Ref Range   Folate 5.9 (L) >5.9 ng/mL  Reticulocytes   Collection Time: 11/25/20  5:37 PM  Result Value Ref Range   Retic Ct Pct 2.2 0.4 - 3.1 %   RBC. 3.88 3.87 - 5.11 MIL/uL   Retic Count, Absolute 84.6 19.0 -  186.0 K/uL   Immature Retic Fract 9.6 2.3 - 15.9 %  Comprehensive metabolic panel   Collection Time: 11/26/20  5:38 AM  Result Value Ref Range   Sodium 133 (L) 135 - 145 mmol/L   Potassium 2.8 (L) 3.5 - 5.1 mmol/L   Chloride 105 98 - 111 mmol/L   CO2 20 (L) 22 - 32 mmol/L   Glucose, Bld 82 70 - 99 mg/dL   BUN 8 6 - 20 mg/dL   Creatinine, Ser 4.09 0.44 - 1.00 mg/dL   Calcium 8.0 (L) 8.9 - 10.3 mg/dL   Total Protein 4.4 (L) 6.5 - 8.1 g/dL   Albumin 2.0 (L) 3.5 - 5.0 g/dL   AST 811 (H) 15 - 41 U/L   ALT 383 (H) 0 - 44 U/L   Alkaline Phosphatase 51 38 - 126 U/L   Total Bilirubin 2.8 (H) 0.3 - 1.2 mg/dL   GFR, Estimated >91 >47 mL/min   Anion gap 8 5 - 15  Lipase, blood   Collection Time: 11/26/20  5:38 AM  Result Value Ref Range   Lipase 62 (H) 11 - 51 U/L  CBC Once   Collection Time: 11/26/20  5:38 AM  Result Value Ref Range   WBC 7.1 4.0 - 10.5 K/uL   RBC 3.37 (L) 3.87 - 5.11 MIL/uL   Hemoglobin 9.2 (L) 12.0 - 15.0 g/dL   HCT 82.9 (L) 56.2 - 13.0 %   MCV 78.9 (L) 80.0 - 100.0 fL   MCH 27.3 26.0 - 34.0 pg   MCHC 34.6 30.0 - 36.0 g/dL   RDW 86.5 78.4 - 69.6 %   Platelets 120 (L) 150 - 400 K/uL   nRBC 0.0 0.0 - 0.2 %  Protime-INR   Collection Time: 11/26/20  5:38 AM  Result Value Ref Range   Prothrombin Time 15.0 11.4 - 15.2 seconds   INR 1.2 0.8 - 1.2  Magnesium   Collection Time: 11/26/20  5:38 AM  Result Value Ref Range   Magnesium 1.4 (L) 1.7 - 2.4 mg/dL  Protein, urine, 24 hour   Collection Time: 11/27/20  6:33 AM  Result Value Ref Range   Urine Total Volume-UPROT 500 mL   Collection Interval-UPROT 24 hours   Protein, Urine 49 mg/dL   Protein, 29B Urine NOT CALCULATED 50 - 100 mg/day  Creatinine, urine, 24 hour   Collection Time: 11/27/20  6:33 AM  Result Value Ref Range   Urine Total Volume-UCRE24 500 mL   Collection Interval-UCRE24 24 hours   Creatinine, Urine 72.58 mg/dL   Creatinine, 28U Ur 132 (L) 600 - 1,800 mg/day  CBC   Collection Time: 11/27/20   8:18 AM  Result Value Ref Range   WBC 7.0 4.0 - 10.5 K/uL   RBC 3.76 (L) 3.87 - 5.11 MIL/uL   Hemoglobin 10.0 (L) 12.0 -  15.0 g/dL   HCT 00.9 (L) 38.1 - 82.9 %   MCV 79.8 (L) 80.0 - 100.0 fL   MCH 26.6 26.0 - 34.0 pg   MCHC 33.3 30.0 - 36.0 g/dL   RDW 93.7 16.9 - 67.8 %   Platelets 127 (L) 150 - 400 K/uL   nRBC 0.0 0.0 - 0.2 %  Magnesium   Collection Time: 11/27/20  8:18 AM  Result Value Ref Range   Magnesium 1.6 (L) 1.7 - 2.4 mg/dL  Comprehensive metabolic panel   Collection Time: 11/27/20  8:18 AM  Result Value Ref Range   Sodium 133 (L) 135 - 145 mmol/L   Potassium 3.3 (L) 3.5 - 5.1 mmol/L   Chloride 104 98 - 111 mmol/L   CO2 22 22 - 32 mmol/L   Glucose, Bld 78 70 - 99 mg/dL   BUN <5 (L) 6 - 20 mg/dL   Creatinine, Ser 9.38 0.44 - 1.00 mg/dL   Calcium 7.9 (L) 8.9 - 10.3 mg/dL   Total Protein 4.9 (L) 6.5 - 8.1 g/dL   Albumin 2.2 (L) 3.5 - 5.0 g/dL   AST 101 (H) 15 - 41 U/L   ALT 369 (H) 0 - 44 U/L   Alkaline Phosphatase 62 38 - 126 U/L   Total Bilirubin 2.6 (H) 0.3 - 1.2 mg/dL   GFR, Estimated >75 >10 mL/min   Anion gap 7 5 - 15  Lipase, blood   Collection Time: 11/27/20  8:18 AM  Result Value Ref Range   Lipase 47 11 - 51 U/L  Comprehensive metabolic panel   Collection Time: 11/28/20  8:00 AM  Result Value Ref Range   Sodium 132 (L) 135 - 145 mmol/L   Potassium 3.7 3.5 - 5.1 mmol/L   Chloride 102 98 - 111 mmol/L   CO2 17 (L) 22 - 32 mmol/L   Glucose, Bld 67 (L) 70 - 99 mg/dL   BUN <5 (L) 6 - 20 mg/dL   Creatinine, Ser 2.58 0.44 - 1.00 mg/dL   Calcium 8.0 (L) 8.9 - 10.3 mg/dL   Total Protein 4.7 (L) 6.5 - 8.1 g/dL   Albumin 2.2 (L) 3.5 - 5.0 g/dL   AST 527 (H) 15 - 41 U/L   ALT 328 (H) 0 - 44 U/L   Alkaline Phosphatase 64 38 - 126 U/L   Total Bilirubin 2.3 (H) 0.3 - 1.2 mg/dL   GFR, Estimated >78 >24 mL/min   Anion gap 13 5 - 15  Lipase, blood   Collection Time: 11/28/20  8:00 AM  Result Value Ref Range   Lipase 48 11 - 51 U/L  Comprehensive metabolic  panel   Collection Time: 11/29/20  8:17 AM  Result Value Ref Range   Sodium 134 (L) 135 - 145 mmol/L   Potassium 3.7 3.5 - 5.1 mmol/L   Chloride 101 98 - 111 mmol/L   CO2 23 22 - 32 mmol/L   Glucose, Bld 76 70 - 99 mg/dL   BUN <5 (L) 6 - 20 mg/dL   Creatinine, Ser 2.35 (L) 0.44 - 1.00 mg/dL   Calcium 8.4 (L) 8.9 - 10.3 mg/dL   Total Protein 5.3 (L) 6.5 - 8.1 g/dL   Albumin 2.3 (L) 3.5 - 5.0 g/dL   AST 361 (H) 15 - 41 U/L   ALT 375 (H) 0 - 44 U/L   Alkaline Phosphatase 77 38 - 126 U/L   Total Bilirubin 2.4 (H) 0.3 - 1.2 mg/dL   GFR, Estimated >44 >31  mL/min   Anion gap 10 5 - 15  Comprehensive metabolic panel   Collection Time: 11/30/20  5:23 AM  Result Value Ref Range   Sodium 133 (L) 135 - 145 mmol/L   Potassium 2.9 (L) 3.5 - 5.1 mmol/L   Chloride 102 98 - 111 mmol/L   CO2 25 22 - 32 mmol/L   Glucose, Bld 84 70 - 99 mg/dL   BUN <5 (L) 6 - 20 mg/dL   Creatinine, Ser 4.09 (L) 0.44 - 1.00 mg/dL   Calcium 8.3 (L) 8.9 - 10.3 mg/dL   Total Protein 4.9 (L) 6.5 - 8.1 g/dL   Albumin 2.0 (L) 3.5 - 5.0 g/dL   AST 811 (H) 15 - 41 U/L   ALT 331 (H) 0 - 44 U/L   Alkaline Phosphatase 71 38 - 126 U/L   Total Bilirubin 2.0 (H) 0.3 - 1.2 mg/dL   GFR, Estimated >91 >47 mL/min   Anion gap 6 5 - 15    Discharge Condition: Stable  Disposition: Discharge disposition: 01-Home or Self Care        Discharge Instructions     Discharge activity:  No Restrictions   Complete by: As directed    Discharge diet:  No restrictions   Complete by: As directed    No sexual activity restrictions   Complete by: As directed       Allergies as of 11/30/2020   No Known Allergies      Medication List     STOP taking these medications    Doxylamine-Pyridoxine 10-10 MG Tbec   methylPREDNISolone 8 MG tablet Commonly known as: MEDROL   potassium chloride 20 MEQ packet Commonly known as: KLOR-CON   prochlorperazine 10 MG tablet Commonly known as: COMPAZINE   promethazine 25 MG  suppository Commonly known as: PHENERGAN   promethazine 25 MG tablet Commonly known as: PHENERGAN       TAKE these medications    famotidine 10 MG tablet Commonly known as: PEPCID Take 1 tablet (10 mg total) by mouth 2 (two) times daily.   metoCLOPramide 10 MG tablet Commonly known as: REGLAN Take 1 tablet (10 mg total) by mouth 4 (four) times daily -  before meals and at bedtime.   ondansetron 8 MG disintegrating tablet Commonly known as: Zofran ODT Take 1 tablet (8 mg total) by mouth every 8 (eight) hours as needed for refractory nausea / vomiting.   oxyCODONE 5 MG/5ML solution Commonly known as: ROXICODONE Take 5-10 mLs (5-10 mg total) by mouth every 4 (four) hours as needed for moderate pain or severe pain (5mg  for moderate pain, 10mg  for severe pain).   potassium chloride SA 20 MEQ tablet Commonly known as: KLOR-CON Take 1 tablet (20 mEq total) by mouth 2 (two) times daily.   PRENATAL GUMMIES/DHA & FA PO Take 2 tablets by mouth daily.   scopolamine 1 MG/3DAYS Commonly known as: TRANSDERM-SCOP Place 1 patch (1.5 mg total) onto the skin every 3 (three) days.        Follow-up Information     [facility] on 12/14/2020.   Specialty: General Surgery Why: 9:45am, arrive by 9:15am for paperwork and check in process Contact information: [address] [place] [address] [place] [phone]         [facility] Follow up.   Specialty: Obstetrics and Gynecology Why: Pt has appt this coming Thursday Contact information: [address] [place] [address] [place] [phone]  Signed: Hermina StaggersMichael L Baleigh Rennaker M.D. 11/30/2020, 10:12 AM

## 2020-12-01 ENCOUNTER — Telehealth: Payer: Self-pay

## 2020-12-01 LAB — ANTINUCLEAR ANTIBODIES, IFA: ANA Ab, IFA: NEGATIVE

## 2020-12-01 NOTE — Telephone Encounter (Signed)
Transition Care Management Unsuccessful Follow-up Telephone Call  Date of discharge and from where:  11/30/2020-[facility]  Attempts:  1st Attempt  Reason for unsuccessful TCM follow-up call:  Unable to reach patient

## 2020-12-02 ENCOUNTER — Encounter (HOSPITAL_COMMUNITY): Payer: Self-pay | Admitting: Obstetrics and Gynecology

## 2020-12-02 ENCOUNTER — Other Ambulatory Visit: Payer: Self-pay | Admitting: Advanced Practice Midwife

## 2020-12-02 ENCOUNTER — Observation Stay (HOSPITAL_COMMUNITY)
Admission: AD | Admit: 2020-12-02 | Discharge: 2020-12-03 | Disposition: A | Discharge status: Home / Self Care | Payer: Medicaid Other | Attending: Obstetrics and Gynecology | Admitting: Obstetrics and Gynecology

## 2020-12-02 ENCOUNTER — Ambulatory Visit (INDEPENDENT_AMBULATORY_CARE_PROVIDER_SITE_OTHER): Payer: Medicaid Other | Admitting: Advanced Practice Midwife

## 2020-12-02 ENCOUNTER — Other Ambulatory Visit: Payer: Self-pay

## 2020-12-02 VITALS — BP 138/86 | HR 150 | Wt 130.0 lb

## 2020-12-02 DIAGNOSIS — O211 Hyperemesis gravidarum with metabolic disturbance: Secondary | ICD-10-CM

## 2020-12-02 DIAGNOSIS — R1110 Vomiting, unspecified: Secondary | ICD-10-CM | POA: Diagnosis present

## 2020-12-02 DIAGNOSIS — Z3A16 16 weeks gestation of pregnancy: Secondary | ICD-10-CM

## 2020-12-02 DIAGNOSIS — O210 Mild hyperemesis gravidarum: Secondary | ICD-10-CM | POA: Diagnosis present

## 2020-12-02 DIAGNOSIS — R7989 Other specified abnormal findings of blood chemistry: Secondary | ICD-10-CM | POA: Diagnosis present

## 2020-12-02 DIAGNOSIS — Z20822 Contact with and (suspected) exposure to covid-19: Secondary | ICD-10-CM | POA: Diagnosis present

## 2020-12-02 DIAGNOSIS — Z9049 Acquired absence of other specified parts of digestive tract: Secondary | ICD-10-CM

## 2020-12-02 LAB — URINALYSIS, ROUTINE W REFLEX MICROSCOPIC
Glucose, UA: NEGATIVE mg/dL
Hgb urine dipstick: NEGATIVE
Ketones, ur: 80 mg/dL — AB
Leukocytes,Ua: NEGATIVE
Nitrite: NEGATIVE
Protein, ur: 100 mg/dL — AB
Specific Gravity, Urine: 1.025 (ref 1.005–1.030)
pH: 7 (ref 5.0–8.0)

## 2020-12-02 LAB — COMPREHENSIVE METABOLIC PANEL
ALT: 308 U/L — ABNORMAL HIGH (ref 0–44)
AST: 207 U/L — ABNORMAL HIGH (ref 15–41)
Albumin: 2.7 g/dL — ABNORMAL LOW (ref 3.5–5.0)
Alkaline Phosphatase: 93 U/L (ref 38–126)
Anion gap: 10 (ref 5–15)
BUN: 9 mg/dL (ref 6–20)
CO2: 23 mmol/L (ref 22–32)
Calcium: 8.7 mg/dL — ABNORMAL LOW (ref 8.9–10.3)
Chloride: 101 mmol/L (ref 98–111)
Creatinine, Ser: 0.48 mg/dL (ref 0.44–1.00)
GFR, Estimated: >60 mL/min (ref >60)
Glucose, Bld: 80 mg/dL (ref 70–99)
Potassium: 3.9 mmol/L (ref 3.5–5.1)
Sodium: 134 mmol/L — ABNORMAL LOW (ref 135–145)
Total Bilirubin: 1.8 mg/dL — ABNORMAL HIGH (ref 0.3–1.2)
Total Protein: 6.3 g/dL — ABNORMAL LOW (ref 6.5–8.1)

## 2020-12-02 LAB — CBC
HCT: 34.5 % — ABNORMAL LOW (ref 36.0–46.0)
Hemoglobin: 11.4 g/dL — ABNORMAL LOW (ref 12.0–15.0)
MCH: 26.6 pg (ref 26.0–34.0)
MCHC: 33 g/dL (ref 30.0–36.0)
MCV: 80.4 fL (ref 80.0–100.0)
Platelets: 190 10*3/uL (ref 150–400)
RBC: 4.29 MIL/uL (ref 3.87–5.11)
RDW: 14.6 % (ref 11.5–15.5)
WBC: 10.8 10*3/uL — ABNORMAL HIGH (ref 4.0–10.5)
nRBC: 0.4 % — ABNORMAL HIGH (ref 0.0–0.2)

## 2020-12-02 MED ORDER — SODIUM CHLORIDE 0.9 % IV SOLN
12.5000 mg | Freq: Once | INTRAVENOUS | Status: AC
  Administered 2020-12-02: 12.5 mg via INTRAVENOUS
  Filled 2020-12-02: qty 0.5

## 2020-12-02 MED ORDER — FAMOTIDINE 20 MG PO TABS
20.0000 mg | ORAL_TABLET | Freq: Two times a day (BID) | ORAL | Status: DC
  Administered 2020-12-03: 20 mg via ORAL
  Filled 2020-12-02: qty 1

## 2020-12-02 MED ORDER — SCOPOLAMINE 1 MG/3DAYS TD PT72
1.0000 | MEDICATED_PATCH | TRANSDERMAL | Status: DC
  Administered 2020-12-02: 1.5 mg via TRANSDERMAL
  Filled 2020-12-02: qty 1

## 2020-12-02 MED ORDER — FAMOTIDINE IN NACL 20-0.9 MG/50ML-% IV SOLN: 20.0000 mg | Freq: Two times a day (BID) | INTRAVENOUS | Status: DC

## 2020-12-02 MED ORDER — LACTATED RINGERS IV BOLUS: 500.0000 mL | Freq: Once | INTRAVENOUS | Status: DC

## 2020-12-02 MED ORDER — ONDANSETRON HCL 4 MG/2ML IJ SOLN
4.0000 mg | Freq: Three times a day (TID) | INTRAMUSCULAR | Status: DC | PRN
  Administered 2020-12-03: 4 mg via INTRAVENOUS
  Filled 2020-12-02: qty 2

## 2020-12-02 MED ORDER — METOCLOPRAMIDE HCL 5 MG/ML IJ SOLN
10.0000 mg | Freq: Once | INTRAMUSCULAR | Status: AC
  Administered 2020-12-02: 10 mg via INTRAVENOUS
  Filled 2020-12-02: qty 2

## 2020-12-02 MED ORDER — METOCLOPRAMIDE HCL 10 MG PO TABS: 10.0000 mg | ORAL_TABLET | Freq: Four times a day (QID) | ORAL | Status: DC

## 2020-12-02 MED ORDER — PROMETHAZINE HCL 25 MG RE SUPP
12.5000 mg | RECTAL | Status: DC | PRN
  Filled 2020-12-02: qty 1

## 2020-12-02 MED ORDER — LACTATED RINGERS IV BOLUS
1000.0000 mL | Freq: Once | INTRAVENOUS | Status: AC
  Administered 2020-12-02: 1000 mL via INTRAVENOUS

## 2020-12-02 MED ORDER — FAMOTIDINE IN NACL 20-0.9 MG/50ML-% IV SOLN
20.0000 mg | Freq: Once | INTRAVENOUS | Status: AC
  Administered 2020-12-02: 20 mg via INTRAVENOUS
  Filled 2020-12-02: qty 50

## 2020-12-02 MED ORDER — KCL IN DEXTROSE-NACL 20-5-0.45 MEQ/L-%-% IV SOLN
INTRAVENOUS | Status: DC
  Filled 2020-12-02 (×2): qty 1000

## 2020-12-02 MED ORDER — ONDANSETRON HCL 4 MG/2ML IJ SOLN
4.0000 mg | Freq: Once | INTRAMUSCULAR | Status: AC
  Administered 2020-12-02: 4 mg via INTRAVENOUS
  Filled 2020-12-02: qty 2

## 2020-12-02 MED ORDER — ONDANSETRON 4 MG PO TBDP: 4.0000 mg | ORAL_TABLET | Freq: Three times a day (TID) | ORAL | Status: DC | PRN

## 2020-12-02 MED ORDER — HYDROXYZINE HCL 50 MG PO TABS: 50.0000 mg | ORAL_TABLET | Freq: Four times a day (QID) | ORAL | Status: DC | PRN

## 2020-12-02 MED ORDER — PROMETHAZINE HCL 25 MG PO TABS: 12.5000 mg | ORAL_TABLET | ORAL | Status: DC | PRN

## 2020-12-02 MED ORDER — SODIUM CHLORIDE 0.9 % IV SOLN
8.0000 mg | Freq: Three times a day (TID) | INTRAVENOUS | Status: DC | PRN
  Filled 2020-12-02 (×2): qty 4

## 2020-12-02 MED ORDER — THIAMINE HCL 100 MG/ML IJ SOLN
INTRAVENOUS | Status: DC
  Filled 2020-12-02: qty 1000

## 2020-12-02 MED ORDER — HYDROXYZINE HCL 50 MG/ML IM SOLN
50.0000 mg | Freq: Four times a day (QID) | INTRAMUSCULAR | Status: DC | PRN
  Filled 2020-12-02: qty 1

## 2020-12-02 MED ORDER — METOCLOPRAMIDE HCL 5 MG/ML IJ SOLN
10.0000 mg | Freq: Four times a day (QID) | INTRAMUSCULAR | Status: DC
  Administered 2020-12-03 (×2): 10 mg via INTRAVENOUS
  Filled 2020-12-02 (×2): qty 2

## 2020-12-02 MED ORDER — SODIUM CHLORIDE 0.9 % IV SOLN
12.5000 mg | Freq: Once | INTRAVENOUS | Status: DC
  Filled 2020-12-02: qty 0.5

## 2020-12-02 NOTE — MAU Provider Note (Addendum)
History     CSN: [id number]  Arrival date and time: 12/02/20 1643   Event Date/Time   First Provider Initiated Contact with Patient 12/02/20 1747      Chief Complaint  Patient presents with   Emesis   Nausea   Emesis  Pertinent negatives include no chest pain, chills, dizziness, fever or headaches.  25 yo F G3P1011 at [redacted]w[redacted]d with history of severe hyperemesis gravidarum and POD#7 s/p laparoscopic cholecystectomy for cholelithiasis  who presents from the office unable to tolerate PO. 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   Patient took one dose of Reglan today and not using her medication as scheduled. Reports tried to eat a [place] sandwich but did not tolerate. Has not tried smaller bites of crackers, etc. Denies abdominal pain  today  OB History     Gravida  3   Para  1   Term  1   Preterm      AB  1   Living  1      SAB  1   IAB      Ectopic      Multiple  0   Live Births  1           Past Medical History:  Diagnosis Date   Depression    when going through hyperemesis with first preg, ok now   Hyperemesis gravidarum     Past Surgical History:  Procedure Laterality Date   CHOLECYSTECTOMY N/A 11/25/2020   Procedure: LAPAROSCOPIC CHOLECYSTECTOMY;  Surgeon: Harriette Bouillon, MD;  Location: [facility];  Service: General;  Laterality: N/A;   DILATION AND CURETTAGE OF UTERUS N/A 07/15/2014   Procedure: SUCTION DILATATION AND CURETTAGE;  Surgeon: Lazaro Arms, MD;  Location: [facility];  Service: Gynecology;  Laterality: N/A;    Family History  Problem Relation Age of Onset   Healthy Mother    Aneurysm Father    Hypertension Maternal Grandmother    ADD / ADHD Neg Hx    Alcohol abuse Neg Hx    Anxiety disorder Neg Hx    Arthritis Neg Hx    Birth defects Neg Hx    Asthma Neg Hx    Cancer Neg Hx    COPD Neg Hx    Depression Neg Hx    Diabetes Neg Hx    Drug abuse Neg Hx    Early death Neg Hx    Hearing loss Neg Hx    Heart disease  Neg Hx    Hyperlipidemia Neg Hx    Intellectual disability Neg Hx    Kidney disease Neg Hx    Learning disabilities Neg Hx    Miscarriages / Stillbirths Neg Hx    Obesity Neg Hx    Stroke Neg Hx    Vision loss Neg Hx    Varicose Veins Neg Hx     Social History   Tobacco Use   Smoking status: Never   Smokeless tobacco: Never  Vaping Use   Vaping Use: Never used  Substance Use Topics   Alcohol use: No   Drug use: No    Allergies: No Known Allergies  Medications Prior to Admission  Medication Sig Dispense Refill Last Dose   metoCLOPramide (REGLAN) 10 MG tablet Take 1 tablet (10 mg total) by mouth 4 (four) times daily -  before meals and at bedtime. 90 tablet 2 12/02/2020   ondansetron (ZOFRAN ODT) 8 MG disintegrating tablet Take 1 tablet (8 mg total) by mouth  every 8 (eight) hours as needed for refractory nausea / vomiting. 30 tablet 0 12/01/2020   oxyCODONE (ROXICODONE) 5 MG/5ML solution Take 5-10 mLs (5-10 mg total) by mouth every 4 (four) hours as needed for moderate pain or severe pain (5mg  for moderate pain, 10mg  for severe pain). 120 mL 0 12/02/2020   potassium chloride SA (KLOR-CON) 20 MEQ tablet Take 1 tablet (20 mEq total) by mouth 2 (two) times daily. 10 tablet 1 12/02/2020   scopolamine (TRANSDERM-SCOP) 1 MG/3DAYS Place 1 patch (1.5 mg total) onto the skin every 3 (three) days. 10 patch 12 Past Week   Prenatal MV-Min-FA-Omega-3 (PRENATAL GUMMIES/DHA & FA PO) Take 2 tablets by mouth daily.  (Patient not taking: Reported on 12/02/2020)       Review of Systems  Constitutional:  Negative for chills and fever.  Eyes:  Negative for visual disturbance.  Respiratory:  Negative for shortness of breath.   Cardiovascular:  Negative for chest pain.  Gastrointestinal:  Positive for nausea and vomiting. Negative for constipation.  Endocrine: Negative for polyuria.  Genitourinary:  Negative for dysuria.  Musculoskeletal:  Negative for back pain.  Neurological:  Negative for dizziness and  headaches.  Hematological:  Negative for adenopathy.  Psychiatric/Behavioral:         Appears uncomfortable  Physical Exam   Blood pressure 119/70, pulse (!) 108, temperature 98.4 F (36.9 C), temperature source Oral, resp. rate 19, height 5\' 8"  (1.727 m), weight 58.8 kg, last menstrual period 08/10/2020, SpO2 98 %, unknown if currently breastfeeding.  Physical Exam Constitutional:      Comments: Appears uncomfortable but not ill appearing  Cardiovascular:     Rate and Rhythm: Regular rhythm. Tachycardia present.     Pulses: Normal pulses.  Pulmonary:     Effort: Pulmonary effort is normal.  Abdominal:     General: There is no distension.     Tenderness: There is no abdominal tenderness.     Comments: Laparoscoping incision well approximated and healing  Skin:    General: Skin is warm.     Capillary Refill: Capillary refill takes less than 2 seconds.  Neurological:     General: No focal deficit present.     Mental Status: She is alert and oriented to person, place, and time.  Psychiatric:        Mood and Affect: Mood normal.    MAU Course  MDM 25 yo F G3P1011 at [redacted]w[redacted]d with history of severe hyperemesis gravidarum and POD#7 s/p laparoscopic cholecystectomy for cholelithiasis  who presents from the office unable to tolerate PO. Likely secondary to her known HG. Patient appears tachycardic, other VSS. Likely dehydration 2/2 vomiting. -CBC -CMP, replete PRN -IV Fluid bolus -IV Reglan  6:43 PM Continues to have nausea without any relief with Reglan.  - IV Zofran - IV Fluid bolus - Scopolamine patch - Famotidine  7:57 PM Still continues to have nausea. HR improved to 106.  - IV phenergan  Labs back - AST/ALT continue to downtrend. K 3.9  Assessment and Plan  Hyperemesis gravidarum Presents from the office unable to tolerate PO. Likely secondary to her known HG. Patient appears tachycardic, other VSS. Likely dehydration 2/2 vomiting. -CBC -CMP, replete PRN -IV Fluid  bolus -IV Reglan  2. Elevated liver enzymes Known LFT elevations, improving from discharge - follow up  3. Post Op Day#7  No evidence of post op infection at this time.   Report given to 10/10/2020 who assumes care of this patient.30  Das, MD, MPH OB Fellow, [facility]  Will try dose of Decadron for hyperemesis  Finished 3rd liter and does not feel any better Results for orders placed or performed during the hospital encounter of 12/02/20 (from the past 24 hour(s))  Urinalysis, Routine w reflex microscopic Urine, Clean Catch     Status: Abnormal   Collection Time: 12/02/20  5:29 PM  Result Value Ref Range   Color, Urine AMBER (A) YELLOW   APPearance HAZY (A) CLEAR   Specific Gravity, Urine 1.025 1.005 - 1.030   pH 7.0 5.0 - 8.0   Glucose, UA NEGATIVE NEGATIVE mg/dL   Hgb urine dipstick NEGATIVE NEGATIVE   Bilirubin Urine MODERATE (A) NEGATIVE   Ketones, ur 80 (A) NEGATIVE mg/dL   Protein, ur 100 (A) NEGATIVE mg/dL   Nitrite NEGATIVE NEGATIVE   Leukocytes,Ua NEGATIVE NEGATIVE   RBC / HPF 0-5 0 - 5 RBC/hpf   WBC, UA 6-10 0 - 5 WBC/hpf   Bacteria, UA RARE (A) NONE SEEN   Squamous Epithelial / LPF 0-5 0 - 5   Mucus PRESENT    Non Squamous Epithelial 0-5 (A) NONE SEEN  CBC     Status: Abnormal   Collection Time: 12/02/20  6:07 PM  Result Value Ref Range   WBC 10.8 (H) 4.0 - 10.5 K/uL   RBC 4.29 3.87 - 5.11 MIL/uL   Hemoglobin 11.4 (L) 12.0 - 15.0 g/dL   HCT 34.5 (L) 36.0 - 46.0 %   MCV 80.4 80.0 - 100.0 fL   MCH 26.6 26.0 - 34.0 pg   MCHC 33.0 30.0 - 36.0 g/dL   RDW 14.6 11.5 - 15.5 %   Platelets 190 150 - 400 K/uL   nRBC 0.4 (H) 0.0 - 0.2 %  Comprehensive metabolic panel     Status: Abnormal   Collection Time: 12/02/20  6:07 PM  Result Value Ref Range   Sodium 134 (L) 135 - 145 mmol/L   Potassium 3.9 3.5 - 5.1 mmol/L   Chloride 101 98 - 111 mmol/L   CO2 23 22 - 32 mmol/L   Glucose, Bld 80 70 - 99 mg/dL   BUN 9 6 - 20 mg/dL   Creatinine, Ser  0.48 0.44 - 1.00 mg/dL   Calcium 8.7 (L) 8.9 - 10.3 mg/dL   Total Protein 6.3 (L) 6.5 - 8.1 g/dL   Albumin 2.7 (L) 3.5 - 5.0 g/dL   AST 207 (H) 15 - 41 U/L   ALT 308 (H) 0 - 44 U/L   Alkaline Phosphatase 93 38 - 126 U/L   Total Bilirubin 1.8 (H) 0.3 - 1.2 mg/dL   GFR, Estimated >60 >60 mL/min   Anion gap 10 5 - 15    Discussed with Dr Ozan Will admit for hydration and observation  Juelz Whittenberg L, CNM    

## 2020-12-02 NOTE — Discharge Instructions (Addendum)
Please take your scheduled medications including your scheduled Reglan and Promethazine and Scopolamine patch. Also please go to your scheduled IV infusion.

## 2020-12-02 NOTE — MAU Note (Signed)
Presents c/o N/V, reports unable to keep anything down.  States taking prescribed meds, but they're not working.

## 2020-12-02 NOTE — MAU Note (Signed)
Pt states she still has nausea and is no better. Does not want to try anything PO. Artelia Laroche CNM aware. Will run in current 3rd bag of flds.

## 2020-12-02 NOTE — Progress Notes (Signed)
Dr Ephriam Jenkins aware pt states she does not feel better and having lower back pain which she has had and is not new. Aware pt continues to have nausea. Will try Phenergan

## 2020-12-02 NOTE — H&P (Signed)
History     CSN: [id number]  Arrival date and time: 12/02/20 1643   Event Date/Time   First Provider Initiated Contact with Patient 12/02/20 1747      Chief Complaint  Patient presents with   Emesis   Nausea   Emesis  Pertinent negatives include no chest pain, chills, dizziness, fever or headaches.  25 yo F G3P1011 at [redacted]w[redacted]d with history of severe hyperemesis gravidarum and POD#7 s/p laparoscopic cholecystectomy for cholelithiasis  who presents from the office unable to tolerate PO. 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   Patient took one dose of Reglan today and not using her medication as scheduled. Reports tried to eat a [place] sandwich but did not tolerate. Has not tried smaller bites of crackers, etc. Denies abdominal pain  today  OB History     Gravida  3   Para  1   Term  1   Preterm      AB  1   Living  1      SAB  1   IAB      Ectopic      Multiple  0   Live Births  1           Past Medical History:  Diagnosis Date   Depression    when going through hyperemesis with first preg, ok now   Hyperemesis gravidarum     Past Surgical History:  Procedure Laterality Date   CHOLECYSTECTOMY N/A 11/25/2020   Procedure: LAPAROSCOPIC CHOLECYSTECTOMY;  Surgeon: Harriette Bouillon, MD;  Location: [facility];  Service: General;  Laterality: N/A;   DILATION AND CURETTAGE OF UTERUS N/A 07/15/2014   Procedure: SUCTION DILATATION AND CURETTAGE;  Surgeon: Lazaro Arms, MD;  Location: [facility];  Service: Gynecology;  Laterality: N/A;    Family History  Problem Relation Age of Onset   Healthy Mother    Aneurysm Father    Hypertension Maternal Grandmother    ADD / ADHD Neg Hx    Alcohol abuse Neg Hx    Anxiety disorder Neg Hx    Arthritis Neg Hx    Birth defects Neg Hx    Asthma Neg Hx    Cancer Neg Hx    COPD Neg Hx    Depression Neg Hx    Diabetes Neg Hx    Drug abuse Neg Hx    Early death Neg Hx    Hearing loss Neg Hx    Heart disease  Neg Hx    Hyperlipidemia Neg Hx    Intellectual disability Neg Hx    Kidney disease Neg Hx    Learning disabilities Neg Hx    Miscarriages / Stillbirths Neg Hx    Obesity Neg Hx    Stroke Neg Hx    Vision loss Neg Hx    Varicose Veins Neg Hx     Social History   Tobacco Use   Smoking status: Never   Smokeless tobacco: Never  Vaping Use   Vaping Use: Never used  Substance Use Topics   Alcohol use: No   Drug use: No    Allergies: No Known Allergies  Medications Prior to Admission  Medication Sig Dispense Refill Last Dose   metoCLOPramide (REGLAN) 10 MG tablet Take 1 tablet (10 mg total) by mouth 4 (four) times daily -  before meals and at bedtime. 90 tablet 2 12/02/2020   ondansetron (ZOFRAN ODT) 8 MG disintegrating tablet Take 1 tablet (8 mg total) by mouth  every 8 (eight) hours as needed for refractory nausea / vomiting. 30 tablet 0 12/01/2020   oxyCODONE (ROXICODONE) 5 MG/5ML solution Take 5-10 mLs (5-10 mg total) by mouth every 4 (four) hours as needed for moderate pain or severe pain (5mg  for moderate pain, 10mg  for severe pain). 120 mL 0 12/02/2020   potassium chloride SA (KLOR-CON) 20 MEQ tablet Take 1 tablet (20 mEq total) by mouth 2 (two) times daily. 10 tablet 1 12/02/2020   scopolamine (TRANSDERM-SCOP) 1 MG/3DAYS Place 1 patch (1.5 mg total) onto the skin every 3 (three) days. 10 patch 12 Past Week   Prenatal MV-Min-FA-Omega-3 (PRENATAL GUMMIES/DHA & FA PO) Take 2 tablets by mouth daily.  (Patient not taking: Reported on 12/02/2020)       Review of Systems  Constitutional:  Negative for chills and fever.  Eyes:  Negative for visual disturbance.  Respiratory:  Negative for shortness of breath.   Cardiovascular:  Negative for chest pain.  Gastrointestinal:  Positive for nausea and vomiting. Negative for constipation.  Endocrine: Negative for polyuria.  Genitourinary:  Negative for dysuria.  Musculoskeletal:  Negative for back pain.  Neurological:  Negative for dizziness and  headaches.  Hematological:  Negative for adenopathy.  Psychiatric/Behavioral:         Appears uncomfortable  Physical Exam   Blood pressure 119/70, pulse (!) 108, temperature 98.4 F (36.9 C), temperature source Oral, resp. rate 19, height 5\' 8"  (1.727 m), weight 58.8 kg, last menstrual period 08/10/2020, SpO2 98 %, unknown if currently breastfeeding.  Physical Exam Constitutional:      Comments: Appears uncomfortable but not ill appearing  Cardiovascular:     Rate and Rhythm: Regular rhythm. Tachycardia present.     Pulses: Normal pulses.  Pulmonary:     Effort: Pulmonary effort is normal.  Abdominal:     General: There is no distension.     Tenderness: There is no abdominal tenderness.     Comments: Laparoscoping incision well approximated and healing  Skin:    General: Skin is warm.     Capillary Refill: Capillary refill takes less than 2 seconds.  Neurological:     General: No focal deficit present.     Mental Status: She is alert and oriented to person, place, and time.  Psychiatric:        Mood and Affect: Mood normal.    MAU Course  MDM 25 yo F G3P1011 at [redacted]w[redacted]d with history of severe hyperemesis gravidarum and POD#7 s/p laparoscopic cholecystectomy for cholelithiasis  who presents from the office unable to tolerate PO. Likely secondary to her known HG. Patient appears tachycardic, other VSS. Likely dehydration 2/2 vomiting. -CBC -CMP, replete PRN -IV Fluid bolus -IV Reglan  6:43 PM Continues to have nausea without any relief with Reglan.  - IV Zofran - IV Fluid bolus - Scopolamine patch - Famotidine  7:57 PM Still continues to have nausea. HR improved to 106.  - IV phenergan  Labs back - AST/ALT continue to downtrend. K 3.9  Assessment and Plan  Hyperemesis gravidarum Presents from the office unable to tolerate PO. Likely secondary to her known HG. Patient appears tachycardic, other VSS. Likely dehydration 2/2 vomiting. -CBC -CMP, replete PRN -IV Fluid  bolus -IV Reglan  2. Elevated liver enzymes Known LFT elevations, improving from discharge - follow up  3. Post Op Day#7  No evidence of post op infection at this time.   Report given to 10/10/2020 who assumes care of this patient.30  Ephriam Jenkins, MD, MPH OB Fellow, [facility]  Will try dose of Decadron for hyperemesis  Finished 3rd liter and does not feel any better Results for orders placed or performed during the hospital encounter of 12/02/20 (from the past 24 hour(s))  Urinalysis, Routine w reflex microscopic Urine, Clean Catch     Status: Abnormal   Collection Time: 12/02/20  5:29 PM  Result Value Ref Range   Color, Urine AMBER (A) YELLOW   APPearance HAZY (A) CLEAR   Specific Gravity, Urine 1.025 1.005 - 1.030   pH 7.0 5.0 - 8.0   Glucose, UA NEGATIVE NEGATIVE mg/dL   Hgb urine dipstick NEGATIVE NEGATIVE   Bilirubin Urine MODERATE (A) NEGATIVE   Ketones, ur 80 (A) NEGATIVE mg/dL   Protein, ur 782 (A) NEGATIVE mg/dL   Nitrite NEGATIVE NEGATIVE   Leukocytes,Ua NEGATIVE NEGATIVE   RBC / HPF 0-5 0 - 5 RBC/hpf   WBC, UA 6-10 0 - 5 WBC/hpf   Bacteria, UA RARE (A) NONE SEEN   Squamous Epithelial / LPF 0-5 0 - 5   Mucus PRESENT    Non Squamous Epithelial 0-5 (A) NONE SEEN  CBC     Status: Abnormal   Collection Time: 12/02/20  6:07 PM  Result Value Ref Range   WBC 10.8 (H) 4.0 - 10.5 K/uL   RBC 4.29 3.87 - 5.11 MIL/uL   Hemoglobin 11.4 (L) 12.0 - 15.0 g/dL   HCT 42.3 (L) 53.6 - 14.4 %   MCV 80.4 80.0 - 100.0 fL   MCH 26.6 26.0 - 34.0 pg   MCHC 33.0 30.0 - 36.0 g/dL   RDW 31.5 40.0 - 86.7 %   Platelets 190 150 - 400 K/uL   nRBC 0.4 (H) 0.0 - 0.2 %  Comprehensive metabolic panel     Status: Abnormal   Collection Time: 12/02/20  6:07 PM  Result Value Ref Range   Sodium 134 (L) 135 - 145 mmol/L   Potassium 3.9 3.5 - 5.1 mmol/L   Chloride 101 98 - 111 mmol/L   CO2 23 22 - 32 mmol/L   Glucose, Bld 80 70 - 99 mg/dL   BUN 9 6 - 20 mg/dL   Creatinine, Ser  6.19 0.44 - 1.00 mg/dL   Calcium 8.7 (L) 8.9 - 10.3 mg/dL   Total Protein 6.3 (L) 6.5 - 8.1 g/dL   Albumin 2.7 (L) 3.5 - 5.0 g/dL   AST 509 (H) 15 - 41 U/L   ALT 308 (H) 0 - 44 U/L   Alkaline Phosphatase 93 38 - 126 U/L   Total Bilirubin 1.8 (H) 0.3 - 1.2 mg/dL   GFR, Estimated >32 >67 mL/min   Anion gap 10 5 - 15    Discussed with Dr Charlotta Newton Will admit for hydration and observation  Aviva Signs, CNM

## 2020-12-02 NOTE — Progress Notes (Signed)
Severe HG w/hypokalemia. Unable to tolerate anythng PO.  D5LR w50meQ K per L (x2) ordered along w/IV zofran.  Message to [facility] per Dr. Despina Hidden recommendation (Short stay not open now).

## 2020-12-02 NOTE — Telephone Encounter (Signed)
Transition Care Management Unsuccessful Follow-up Telephone Call  Date of discharge and from where:  11/30/2020-[facility]   Attempts:  2nd Attempt  Reason for unsuccessful TCM follow-up call:  Unable to reach patient

## 2020-12-03 DIAGNOSIS — O210 Mild hyperemesis gravidarum: Secondary | ICD-10-CM | POA: Diagnosis not present

## 2020-12-03 DIAGNOSIS — Z3A16 16 weeks gestation of pregnancy: Secondary | ICD-10-CM | POA: Diagnosis not present

## 2020-12-03 DIAGNOSIS — R1110 Vomiting, unspecified: Secondary | ICD-10-CM | POA: Diagnosis present

## 2020-12-03 DIAGNOSIS — Z9049 Acquired absence of other specified parts of digestive tract: Secondary | ICD-10-CM | POA: Diagnosis not present

## 2020-12-03 DIAGNOSIS — Z20822 Contact with and (suspected) exposure to covid-19: Secondary | ICD-10-CM | POA: Diagnosis present

## 2020-12-03 DIAGNOSIS — R7989 Other specified abnormal findings of blood chemistry: Secondary | ICD-10-CM | POA: Diagnosis not present

## 2020-12-03 DIAGNOSIS — O211 Hyperemesis gravidarum with metabolic disturbance: Secondary | ICD-10-CM | POA: Diagnosis present

## 2020-12-03 DIAGNOSIS — O219 Vomiting of pregnancy, unspecified: Secondary | ICD-10-CM | POA: Diagnosis not present

## 2020-12-03 LAB — COMPREHENSIVE METABOLIC PANEL
ALT: 229 U/L — ABNORMAL HIGH (ref 0–44)
AST: 164 U/L — ABNORMAL HIGH (ref 15–41)
Albumin: 1.9 g/dL — ABNORMAL LOW (ref 3.5–5.0)
Alkaline Phosphatase: 71 U/L (ref 38–126)
Anion gap: 6 (ref 5–15)
BUN: 7 mg/dL (ref 6–20)
CO2: 23 mmol/L (ref 22–32)
Calcium: 7.8 mg/dL — ABNORMAL LOW (ref 8.9–10.3)
Chloride: 104 mmol/L (ref 98–111)
Creatinine, Ser: 0.41 mg/dL — ABNORMAL LOW (ref 0.44–1.00)
GFR, Estimated: >60 mL/min (ref >60)
Glucose, Bld: 90 mg/dL (ref 70–99)
Potassium: 3.5 mmol/L (ref 3.5–5.1)
Sodium: 133 mmol/L — ABNORMAL LOW (ref 135–145)
Total Bilirubin: 1.7 mg/dL — ABNORMAL HIGH (ref 0.3–1.2)
Total Protein: 4.5 g/dL — ABNORMAL LOW (ref 6.5–8.1)

## 2020-12-03 LAB — HEMOCHROMATOSIS DNA-PCR(C282Y,H63D)

## 2020-12-03 LAB — RESP PANEL BY RT-PCR (FLU A&B, COVID) ARPGX2
Influenza A by PCR: NEGATIVE
Influenza B by PCR: NEGATIVE
SARS Coronavirus 2 by RT PCR: NEGATIVE

## 2020-12-03 LAB — MAGNESIUM: Magnesium: 1.6 mg/dL — ABNORMAL LOW (ref 1.7–2.4)

## 2020-12-03 MED ORDER — ACETAMINOPHEN 10 MG/ML IV SOLN
1000.0000 mg | Freq: Once | INTRAVENOUS | Status: AC
  Administered 2020-12-03: 1000 mg via INTRAVENOUS
  Filled 2020-12-03: qty 100

## 2020-12-03 MED ORDER — MAGNESIUM SULFATE 2 GM/50ML IV SOLN
2.0000 g | Freq: Once | INTRAVENOUS | Status: AC
  Administered 2020-12-03: 2 g via INTRAVENOUS
  Filled 2020-12-03: qty 50

## 2020-12-03 NOTE — Progress Notes (Signed)
Discharge instructions given to patient. No changes to medications or follow up appointments.

## 2020-12-03 NOTE — Progress Notes (Addendum)
FACULTY PRACTICE ANTEPARTUM(COMPREHENSIVE) NOTE  Deborah Evans is a 25 y.o. G3P1011 with Estimated Date of Delivery: 05/17/21   By  LMP [redacted]w[redacted]d  who is admitted for hyperemesis.    Length of Stay:  0  Days  Date of admission:12/02/2020  Subjective: She notes her nausea is about an 8/10, but has been sleeping all night.  She vomiting once on arrival, but no more vomiting since that time. Patient reports uterine contraction  activity as none. Patient reports  vaginal bleeding as none. Patient describes fluid per vagina as None.  Vitals:  Blood pressure (!) 142/91, pulse 90, temperature 98.3 F (36.8 C), temperature source Oral, resp. rate 17, height 5\' 8"  (1.727 m), weight 61.7 kg, last menstrual period 08/10/2020, SpO2 98 %, unknown if currently breastfeeding. Vitals:   12/02/20 2321 12/02/20 2353 12/03/20 0402 12/03/20 0540  BP:  (!) 147/82 (!) 142/91   Pulse:  87 90   Resp:  18 17   Temp:  98.4 F (36.9 C) 98.3 F (36.8 C)   TempSrc:  Oral Oral   SpO2: 100% 100% 98%   Weight:    61.7 kg  Height:       Physical Examination:  General appearance - appears fatigued Mental status - normal mood, behavior, speech, dress, motor activity, and thought processes Chest - clear to auscultation Heart - normal rate, regular rhythm Abdomen - soft and non-tender, no distension, incision sites C/D/I with dermabond Extremities - no pedal edema noted, no calf tenderness Skin - warm and dry   Labs:  Results for orders placed or performed during the hospital encounter of 12/02/20 (from the past 24 hour(s))  Urinalysis, Routine w reflex microscopic Urine, Clean Catch   Collection Time: 12/02/20  5:29 PM  Result Value Ref Range   Color, Urine AMBER (A) YELLOW   APPearance HAZY (A) CLEAR   Specific Gravity, Urine 1.025 1.005 - 1.030   pH 7.0 5.0 - 8.0   Glucose, UA NEGATIVE NEGATIVE mg/dL   Hgb urine dipstick NEGATIVE NEGATIVE   Bilirubin Urine MODERATE (A) NEGATIVE   Ketones, ur 80 (A)  NEGATIVE mg/dL   Protein, ur 02/01/21 (A) NEGATIVE mg/dL   Nitrite NEGATIVE NEGATIVE   Leukocytes,Ua NEGATIVE NEGATIVE   RBC / HPF 0-5 0 - 5 RBC/hpf   WBC, UA 6-10 0 - 5 WBC/hpf   Bacteria, UA RARE (A) NONE SEEN   Squamous Epithelial / LPF 0-5 0 - 5   Mucus PRESENT    Non Squamous Epithelial 0-5 (A) NONE SEEN  CBC   Collection Time: 12/02/20  6:07 PM  Result Value Ref Range   WBC 10.8 (H) 4.0 - 10.5 K/uL   RBC 4.29 3.87 - 5.11 MIL/uL   Hemoglobin 11.4 (L) 12.0 - 15.0 g/dL   HCT 02/01/21 (L) 65.0 - 35.4 %   MCV 80.4 80.0 - 100.0 fL   MCH 26.6 26.0 - 34.0 pg   MCHC 33.0 30.0 - 36.0 g/dL   RDW 65.6 81.2 - 75.1 %   Platelets 190 150 - 400 K/uL   nRBC 0.4 (H) 0.0 - 0.2 %  Comprehensive metabolic panel   Collection Time: 12/02/20  6:07 PM  Result Value Ref Range   Sodium 134 (L) 135 - 145 mmol/L   Potassium 3.9 3.5 - 5.1 mmol/L   Chloride 101 98 - 111 mmol/L   CO2 23 22 - 32 mmol/L   Glucose, Bld 80 70 - 99 mg/dL   BUN 9 6 - 20 mg/dL  Creatinine, Ser 0.48 0.44 - 1.00 mg/dL   Calcium 8.7 (L) 8.9 - 10.3 mg/dL   Total Protein 6.3 (L) 6.5 - 8.1 g/dL   Albumin 2.7 (L) 3.5 - 5.0 g/dL   AST 009 (H) 15 - 41 U/L   ALT 308 (H) 0 - 44 U/L   Alkaline Phosphatase 93 38 - 126 U/L   Total Bilirubin 1.8 (H) 0.3 - 1.2 mg/dL   GFR, Estimated >38 >18 mL/min   Anion gap 10 5 - 15  Resp Panel by RT-PCR (Flu A&B, Covid) Nasopharyngeal Swab   Collection Time: 12/02/20 11:18 PM   Specimen: Nasopharyngeal Swab; Nasopharyngeal(NP) swabs in vial transport medium  Result Value Ref Range   SARS Coronavirus 2 by RT PCR NEGATIVE NEGATIVE   Influenza A by PCR NEGATIVE NEGATIVE   Influenza B by PCR NEGATIVE NEGATIVE  Comprehensive metabolic panel   Collection Time: 12/03/20  5:19 AM  Result Value Ref Range   Sodium 133 (L) 135 - 145 mmol/L   Potassium 3.5 3.5 - 5.1 mmol/L   Chloride 104 98 - 111 mmol/L   CO2 23 22 - 32 mmol/L   Glucose, Bld 90 70 - 99 mg/dL   BUN 7 6 - 20 mg/dL   Creatinine, Ser 2.99 (L)  0.44 - 1.00 mg/dL   Calcium 7.8 (L) 8.9 - 10.3 mg/dL   Total Protein 4.5 (L) 6.5 - 8.1 g/dL   Albumin 1.9 (L) 3.5 - 5.0 g/dL   AST 371 (H) 15 - 41 U/L   ALT 229 (H) 0 - 44 U/L   Alkaline Phosphatase 71 38 - 126 U/L   Total Bilirubin 1.7 (H) 0.3 - 1.2 mg/dL   GFR, Estimated >69 >67 mL/min   Anion gap 6 5 - 15  Magnesium   Collection Time: 12/03/20  5:19 AM  Result Value Ref Range   Magnesium 1.6 (L) 1.7 - 2.4 mg/dL    Imaging Studies:    No results found.   Medications:  Scheduled  famotidine  20 mg Oral Q12H   metoCLOPramide  10 mg Oral Q6H   Or   metoCLOPramide (REGLAN) injection  10 mg Intravenous Q6H   scopolamine  1 patch Transdermal Q72H   I have reviewed the patient's current medications.  ASSESSMENT: G3P1011 [redacted]w[redacted]d Estimated Date of Delivery: 05/17/21  Admitted for hyperemesis, elevated LFTs  PLAN: 1) Hyperemesis -scheduled Reglan and vitamin B6, scop patch in place -Zofran, phenergan prn -IVF @ 125cc/hr -hypomagnesemia- plan to replace -adv diet as tolerated today  2) Elevated LFTs S/p cholecystectomy, POD#8 -continue to improve   DISP: Continue plan as outlined above, once tolerating po without vomiting, plan for discharge  Deborah Evans 12/03/2020,6:51 AM

## 2020-12-03 NOTE — Telephone Encounter (Signed)
Transition Care Management Unsuccessful Follow-up Telephone Call  Date of discharge and from where:  11/30/2020-[facility]  Attempts:  3rd Attempt  Reason for unsuccessful TCM follow-up call:  Unable to reach patient

## 2020-12-03 NOTE — Discharge Summary (Signed)
Patient ID: Deborah Evans MRN: [id number] DOB/AGE: 26/09/1995 26 y.o.  Admit date: 12/02/2020 Discharge date: 12/03/2020  Admission Diagnoses: IUP 16 3/7 weeks Hyperemesis  Discharge Diagnoses: SAA  Prenatal Procedures NA    Hospital Course:  This is a 25 y.o. G3P1011 with IUP at [redacted]w[redacted]d admitted with above Dx. She received IV fluids and was continued on her home regiment of antiemetic medications. She was able to tolerated full liquids without any N/V. Pt already has out pt IV fluids scheduled for three times a week starting this Monday. She also plans to terminate pregnancy and is to hear scheduled date for this on Monday.    She was deemed stable for discharge to home with outpatient follow up.  Discharge Exam: Temp:  [97.8 F (36.6 C)-98.4 F (36.9 C)] 97.8 F (36.6 C) (08/05 1208) Pulse Rate:  [87-137] 96 (08/05 1208) Resp:  [17-20] 17 (08/05 1208) BP: (119-147)/(70-99) 143/98 (08/05 1208) SpO2:  [98 %-100 %] 99 % (08/05 1208) Weight:  [58.8 kg-61.7 kg] 61.7 kg (08/05 0540) Physical Examination: CONSTITUTIONAL: Well-developed, well-nourished female in no acute distress.  HENT:  Normocephalic, atraumatic, External right and left ear normal. Oropharynx is clear and moist EYES: Conjunctivae and EOM are normal. Pupils are equal, round, and reactive to light. No scleral icterus.  NECK: Normal range of motion, supple, no masses SKIN: Skin is warm and dry. No rash noted. Not diaphoretic. No erythema. No pallor. NEUROLGIC: Alert and oriented to person, place, and time. Normal reflexes, muscle tone coordination. No cranial nerve deficit noted. PSYCHIATRIC: Normal mood and affect. Normal behavior. Normal judgment and thought content. CARDIOVASCULAR: Normal heart rate noted, regular rhythm RESPIRATORY: Effort and breath sounds normal, no problems with respiration noted MUSCULOSKELETAL: Normal range of motion. No edema and no tenderness. 2+ distal pulses. ABDOMEN: Soft, nontender,  nondistended, gravid.  Significant Diagnostic Studies:  Results for orders placed or performed during the hospital encounter of 12/02/20 (from the past 168 hour(s))  Urinalysis, Routine w reflex microscopic Urine, Clean Catch   Collection Time: 12/02/20  5:29 PM  Result Value Ref Range   Color, Urine AMBER (A) YELLOW   APPearance HAZY (A) CLEAR   Specific Gravity, Urine 1.025 1.005 - 1.030   pH 7.0 5.0 - 8.0   Glucose, UA NEGATIVE NEGATIVE mg/dL   Hgb urine dipstick NEGATIVE NEGATIVE   Bilirubin Urine MODERATE (A) NEGATIVE   Ketones, ur 80 (A) NEGATIVE mg/dL   Protein, ur 401 (A) NEGATIVE mg/dL   Nitrite NEGATIVE NEGATIVE   Leukocytes,Ua NEGATIVE NEGATIVE   RBC / HPF 0-5 0 - 5 RBC/hpf   WBC, UA 6-10 0 - 5 WBC/hpf   Bacteria, UA RARE (A) NONE SEEN   Squamous Epithelial / LPF 0-5 0 - 5   Mucus PRESENT    Non Squamous Epithelial 0-5 (A) NONE SEEN  CBC   Collection Time: 12/02/20  6:07 PM  Result Value Ref Range   WBC 10.8 (H) 4.0 - 10.5 K/uL   RBC 4.29 3.87 - 5.11 MIL/uL   Hemoglobin 11.4 (L) 12.0 - 15.0 g/dL   HCT 02.7 (L) 25.3 - 66.4 %   MCV 80.4 80.0 - 100.0 fL   MCH 26.6 26.0 - 34.0 pg   MCHC 33.0 30.0 - 36.0 g/dL   RDW 40.3 47.4 - 25.9 %   Platelets 190 150 - 400 K/uL   nRBC 0.4 (H) 0.0 - 0.2 %  Comprehensive metabolic panel   Collection Time: 12/02/20  6:07 PM  Result Value Ref  Range   Sodium 134 (L) 135 - 145 mmol/L   Potassium 3.9 3.5 - 5.1 mmol/L   Chloride 101 98 - 111 mmol/L   CO2 23 22 - 32 mmol/L   Glucose, Bld 80 70 - 99 mg/dL   BUN 9 6 - 20 mg/dL   Creatinine, Ser 9.23 0.44 - 1.00 mg/dL   Calcium 8.7 (L) 8.9 - 10.3 mg/dL   Total Protein 6.3 (L) 6.5 - 8.1 g/dL   Albumin 2.7 (L) 3.5 - 5.0 g/dL   AST 300 (H) 15 - 41 U/L   ALT 308 (H) 0 - 44 U/L   Alkaline Phosphatase 93 38 - 126 U/L   Total Bilirubin 1.8 (H) 0.3 - 1.2 mg/dL   GFR, Estimated >76 >22 mL/min   Anion gap 10 5 - 15  Resp Panel by RT-PCR (Flu A&B, Covid) Nasopharyngeal Swab   Collection  Time: 12/02/20 11:18 PM   Specimen: Nasopharyngeal Swab; Nasopharyngeal(NP) swabs in vial transport medium  Result Value Ref Range   SARS Coronavirus 2 by RT PCR NEGATIVE NEGATIVE   Influenza A by PCR NEGATIVE NEGATIVE   Influenza B by PCR NEGATIVE NEGATIVE  Comprehensive metabolic panel   Collection Time: 12/03/20  5:19 AM  Result Value Ref Range   Sodium 133 (L) 135 - 145 mmol/L   Potassium 3.5 3.5 - 5.1 mmol/L   Chloride 104 98 - 111 mmol/L   CO2 23 22 - 32 mmol/L   Glucose, Bld 90 70 - 99 mg/dL   BUN 7 6 - 20 mg/dL   Creatinine, Ser 6.33 (L) 0.44 - 1.00 mg/dL   Calcium 7.8 (L) 8.9 - 10.3 mg/dL   Total Protein 4.5 (L) 6.5 - 8.1 g/dL   Albumin 1.9 (L) 3.5 - 5.0 g/dL   AST 354 (H) 15 - 41 U/L   ALT 229 (H) 0 - 44 U/L   Alkaline Phosphatase 71 38 - 126 U/L   Total Bilirubin 1.7 (H) 0.3 - 1.2 mg/dL   GFR, Estimated >56 >25 mL/min   Anion gap 6 5 - 15  Magnesium   Collection Time: 12/03/20  5:19 AM  Result Value Ref Range   Magnesium 1.6 (L) 1.7 - 2.4 mg/dL  Results for orders placed or performed during the hospital encounter of 11/24/20 (from the past 168 hour(s))  Protein, urine, 24 hour   Collection Time: 11/27/20  6:33 AM  Result Value Ref Range   Urine Total Volume-UPROT 500 mL   Collection Interval-UPROT 24 hours   Protein, Urine 49 mg/dL   Protein, 63S Urine NOT CALCULATED 50 - 100 mg/day  Creatinine, urine, 24 hour   Collection Time: 11/27/20  6:33 AM  Result Value Ref Range   Urine Total Volume-UCRE24 500 mL   Collection Interval-UCRE24 24 hours   Creatinine, Urine 72.58 mg/dL   Creatinine, 93T Ur 342 (L) 600 - 1,800 mg/day  CBC   Collection Time: 11/27/20  8:18 AM  Result Value Ref Range   WBC 7.0 4.0 - 10.5 K/uL   RBC 3.76 (L) 3.87 - 5.11 MIL/uL   Hemoglobin 10.0 (L) 12.0 - 15.0 g/dL   HCT 87.6 (L) 81.1 - 57.2 %   MCV 79.8 (L) 80.0 - 100.0 fL   MCH 26.6 26.0 - 34.0 pg   MCHC 33.3 30.0 - 36.0 g/dL   RDW 62.0 35.5 - 97.4 %   Platelets 127 (L) 150 - 400  K/uL   nRBC 0.0 0.0 - 0.2 %  Magnesium  Collection Time: 11/27/20  8:18 AM  Result Value Ref Range   Magnesium 1.6 (L) 1.7 - 2.4 mg/dL  Comprehensive metabolic panel   Collection Time: 11/27/20  8:18 AM  Result Value Ref Range   Sodium 133 (L) 135 - 145 mmol/L   Potassium 3.3 (L) 3.5 - 5.1 mmol/L   Chloride 104 98 - 111 mmol/L   CO2 22 22 - 32 mmol/L   Glucose, Bld 78 70 - 99 mg/dL   BUN <5 (L) 6 - 20 mg/dL   Creatinine, Ser 4.69 0.44 - 1.00 mg/dL   Calcium 7.9 (L) 8.9 - 10.3 mg/dL   Total Protein 4.9 (L) 6.5 - 8.1 g/dL   Albumin 2.2 (L) 3.5 - 5.0 g/dL   AST 629 (H) 15 - 41 U/L   ALT 369 (H) 0 - 44 U/L   Alkaline Phosphatase 62 38 - 126 U/L   Total Bilirubin 2.6 (H) 0.3 - 1.2 mg/dL   GFR, Estimated >52 >84 mL/min   Anion gap 7 5 - 15  Lipase, blood   Collection Time: 11/27/20  8:18 AM  Result Value Ref Range   Lipase 47 11 - 51 U/L  Comprehensive metabolic panel   Collection Time: 11/28/20  8:00 AM  Result Value Ref Range   Sodium 132 (L) 135 - 145 mmol/L   Potassium 3.7 3.5 - 5.1 mmol/L   Chloride 102 98 - 111 mmol/L   CO2 17 (L) 22 - 32 mmol/L   Glucose, Bld 67 (L) 70 - 99 mg/dL   BUN <5 (L) 6 - 20 mg/dL   Creatinine, Ser 1.32 0.44 - 1.00 mg/dL   Calcium 8.0 (L) 8.9 - 10.3 mg/dL   Total Protein 4.7 (L) 6.5 - 8.1 g/dL   Albumin 2.2 (L) 3.5 - 5.0 g/dL   AST 440 (H) 15 - 41 U/L   ALT 328 (H) 0 - 44 U/L   Alkaline Phosphatase 64 38 - 126 U/L   Total Bilirubin 2.3 (H) 0.3 - 1.2 mg/dL   GFR, Estimated >10 >27 mL/min   Anion gap 13 5 - 15  Lipase, blood   Collection Time: 11/28/20  8:00 AM  Result Value Ref Range   Lipase 48 11 - 51 U/L  Comprehensive metabolic panel   Collection Time: 11/29/20  8:17 AM  Result Value Ref Range   Sodium 134 (L) 135 - 145 mmol/L   Potassium 3.7 3.5 - 5.1 mmol/L   Chloride 101 98 - 111 mmol/L   CO2 23 22 - 32 mmol/L   Glucose, Bld 76 70 - 99 mg/dL   BUN <5 (L) 6 - 20 mg/dL   Creatinine, Ser 2.53 (L) 0.44 - 1.00 mg/dL   Calcium  8.4 (L) 8.9 - 10.3 mg/dL   Total Protein 5.3 (L) 6.5 - 8.1 g/dL   Albumin 2.3 (L) 3.5 - 5.0 g/dL   AST 664 (H) 15 - 41 U/L   ALT 375 (H) 0 - 44 U/L   Alkaline Phosphatase 77 38 - 126 U/L   Total Bilirubin 2.4 (H) 0.3 - 1.2 mg/dL   GFR, Estimated >40 >34 mL/min   Anion gap 10 5 - 15  Comprehensive metabolic panel   Collection Time: 11/30/20  5:23 AM  Result Value Ref Range   Sodium 133 (L) 135 - 145 mmol/L   Potassium 2.9 (L) 3.5 - 5.1 mmol/L   Chloride 102 98 - 111 mmol/L   CO2 25 22 - 32 mmol/L   Glucose, Bld 84  70 - 99 mg/dL   BUN <5 (L) 6 - 20 mg/dL   Creatinine, Ser 1.610.41 (L) 0.44 - 1.00 mg/dL   Calcium 8.3 (L) 8.9 - 10.3 mg/dL   Total Protein 4.9 (L) 6.5 - 8.1 g/dL   Albumin 2.0 (L) 3.5 - 5.0 g/dL   AST 096214 (H) 15 - 41 U/L   ALT 331 (H) 0 - 44 U/L   Alkaline Phosphatase 71 38 - 126 U/L   Total Bilirubin 2.0 (H) 0.3 - 1.2 mg/dL   GFR, Estimated >04>60 >54>60 mL/min   Anion gap 6 5 - 15    Discharge Condition: Stable  Disposition: Discharge disposition: 01-Home or Self Care        Discharge Instructions     Discharge activity:  No Restrictions   Complete by: As directed    Discharge diet:   Complete by: As directed    Full liquids      Allergies as of 12/03/2020   No Known Allergies      Medication List     TAKE these medications    metoCLOPramide 10 MG tablet Commonly known as: REGLAN Take 1 tablet (10 mg total) by mouth 4 (four) times daily -  before meals and at bedtime.   ondansetron 8 MG disintegrating tablet Commonly known as: Zofran ODT Take 1 tablet (8 mg total) by mouth every 8 (eight) hours as needed for refractory nausea / vomiting.   oxyCODONE 5 MG/5ML solution Commonly known as: ROXICODONE Take 5-10 mLs (5-10 mg total) by mouth every 4 (four) hours as needed for moderate pain or severe pain (5mg  for moderate pain, 10mg  for severe pain).   potassium chloride SA 20 MEQ tablet Commonly known as: KLOR-CON Take 1 tablet (20 mEq total) by  mouth 2 (two) times daily.   PRENATAL GUMMIES/DHA & FA PO Take 2 tablets by mouth daily.   scopolamine 1 MG/3DAYS Commonly known as: TRANSDERM-SCOP Place 1 patch (1.5 mg total) onto the skin every 3 (three) days.        Follow-up Information     [facility] Follow up.   Specialty: Obstetrics and Gynecology Why: Pt already has f/u appt Contact information: [address] [place] [id number] [phone]                Signed: Hermina StaggersMichael L Fran Neiswonger M.D. 12/03/2020, 3:53 PM

## 2020-12-03 NOTE — Progress Notes (Signed)
[facility] Visit  Patient name: Deborah Evans MRN [id number]  Date of birth: [birth year]  CC & HPI:  Deborah Evans is a 25 y.o.  female presenting today for new ob.  However, d/t her severe HEG, she plans a TAB, has an appt on Monday.  Feels terrible today, hasn't eaten in days and can't keep any liquids down.  Isn't taking meds because "nothing helps"  Plans to go to MAU today.  Offered to schedule outpt IVF for Monday and Wednesday, pt happily accepts.  Denies SI or wanting to hurt herself.   Pertinent History Reviewed:  Medical & Surgical Hx:   Past Medical History:  Diagnosis Date   Depression    when going through hyperemesis with first preg, ok now   Hyperemesis gravidarum    Past Surgical History:  Procedure Laterality Date   CHOLECYSTECTOMY N/A 11/25/2020   Procedure: LAPAROSCOPIC CHOLECYSTECTOMY;  Surgeon: Harriette Bouillon, MD;  Location: [facility] OR;  Service: General;  Laterality: N/A;   DILATION AND CURETTAGE OF UTERUS N/A 07/15/2014   Procedure: SUCTION DILATATION AND CURETTAGE;  Surgeon: Lazaro Arms, MD;  Location: [facility]S;  Service: Gynecology;  Laterality: N/A;   Family History  Problem Relation Age of Onset   Healthy Mother    Aneurysm Father    Hypertension Maternal Grandmother    ADD / ADHD Neg Hx    Alcohol abuse Neg Hx    Anxiety disorder Neg Hx    Arthritis Neg Hx    Birth defects Neg Hx    Asthma Neg Hx    Cancer Neg Hx    COPD Neg Hx    Depression Neg Hx    Diabetes Neg Hx    Drug abuse Neg Hx    Early death Neg Hx    Hearing loss Neg Hx    Heart disease Neg Hx    Hyperlipidemia Neg Hx    Intellectual disability Neg Hx    Kidney disease Neg Hx    Learning disabilities Neg Hx    Miscarriages / Stillbirths Neg Hx    Obesity Neg Hx    Stroke Neg Hx    Vision loss Neg Hx    Varicose Veins Neg Hx    No current facility-administered medications for this visit. No current outpatient medications on file.  Facility-Administered Medications  Ordered in Other Visits:    dextrose 5 % and 0.45 % NaCl with KCl 20 mEq/L infusion, , Intravenous, Continuous **AND** dextrose 5 % and 0.45% NaCl 1,000 mL with thiamine 100 mg, folic acid 0.6 mg, M.V.I. Adult 10 mL, potassium chloride 20 mEq infusion, , Intravenous, Q24H, Myna Hidalgo, DO, Last Rate: 125 mL/hr at 12/03/20 0107, New Bag at 12/03/20 0107   famotidine (PEPCID) tablet 20 mg, 20 mg, Oral, Q12H **OR** famotidine (PEPCID) IVPB 20 mg premix, 20 mg, Intravenous, Q12H, Ozan, Jennifer, DO   hydrOXYzine (ATARAX/VISTARIL) tablet 50 mg, 50 mg, Oral, Q6H PRN **OR** hydrOXYzine (VISTARIL) injection 50 mg, 50 mg, Intramuscular, Q6H PRN, Ozan, Jennifer, DO   magnesium sulfate IVPB 2 g 50 mL, 2 g, Intravenous, Once, Ozan, Jennifer, DO   metoCLOPramide (REGLAN) tablet 10 mg, 10 mg, Oral, Q6H **OR** metoCLOPramide (REGLAN) injection 10 mg, 10 mg, Intravenous, Q6H, Ozan, Jennifer, DO, 10 mg at 12/03/20 0624   ondansetron (ZOFRAN-ODT) disintegrating tablet 4-8 mg, 4-8 mg, Oral, Q8H PRN **OR** ondansetron (ZOFRAN) injection 4 mg, 4 mg, Intravenous, Q8H PRN, 4 mg at 12/03/20 0634 **OR** ondansetron (ZOFRAN) 8 mg  in sodium chloride 0.9 % 50 mL IVPB, 8 mg, Intravenous, Q8H PRN, Ozan, Jennifer, DO   promethazine (PHENERGAN) tablet 12.5-25 mg, 12.5-25 mg, Oral, Q4H PRN **OR** promethazine (PHENERGAN) suppository 12.5-25 mg, 12.5-25 mg, Rectal, Q4H PRN, Ozan, Jennifer, DO   scopolamine (TRANSDERM-SCOP) 1 MG/3DAYS 1.5 mg, 1 patch, Transdermal, Q72H, Warner Mccreedy, MD, 1.5 mg at 12/02/20 1853 Social History: Reviewed -  reports that she has never smoked. She has never used smokeless tobacco.  Review of Systems:   Constitutional: Negative for fever and chills Eyes: Negative for visual disturbances Respiratory: Negative for shortness of breath, dyspnea Cardiovascular: Negative for chest pain or palpitations  Gastrointestinal: Negative for diarrhea and constipation; no abdominal pain Genitourinary: Negative for  dysuria and urgency, vaginal irritation or itching Musculoskeletal: Negative for back pain, joint pain, myalgias  Neurological: Negative for dizziness and headaches    Objective Findings:    Physical Examination: Vitals:   12/02/20 1517  BP: 138/86  Pulse: (!) 150   General appearance - unwell appearing, tearful Mental status - alert, oriented to person, place, and time Chest:  Normal respiratory effort Heart - normal rate and regular rhythm Abdomen:  Soft Pelvic: deferred Musculoskeletal:  Normal range of motion without pain Extremities:  No edema        Assessment & Plan:  A:   Hyperemesis, plans TAB next week P:  Orders for IVF w/K+, zofran entered and message sent to Short stay staff (per [facility]) to coordinate visit.    No follow-ups on file.  Scarlette Calico Cresenzo-Dishmon CNM 12/03/2020 7:52 AM

## 2020-12-04 ENCOUNTER — Encounter (HOSPITAL_COMMUNITY): Payer: Self-pay | Admitting: Obstetrics & Gynecology

## 2020-12-04 ENCOUNTER — Other Ambulatory Visit: Payer: Self-pay

## 2020-12-04 ENCOUNTER — Inpatient Hospital Stay (HOSPITAL_COMMUNITY)
Admission: AD | Admit: 2020-12-04 | Discharge: 2020-12-04 | Disposition: A | Discharge status: Home / Self Care | Payer: Medicaid Other | Attending: Obstetrics & Gynecology | Admitting: Obstetrics & Gynecology

## 2020-12-04 DIAGNOSIS — O210 Mild hyperemesis gravidarum: Secondary | ICD-10-CM | POA: Insufficient documentation

## 2020-12-04 DIAGNOSIS — O219 Vomiting of pregnancy, unspecified: Secondary | ICD-10-CM

## 2020-12-04 DIAGNOSIS — Z3A16 16 weeks gestation of pregnancy: Secondary | ICD-10-CM | POA: Diagnosis not present

## 2020-12-04 DIAGNOSIS — R1110 Vomiting, unspecified: Secondary | ICD-10-CM

## 2020-12-04 LAB — URINALYSIS, ROUTINE W REFLEX MICROSCOPIC
Bilirubin Urine: NEGATIVE
Glucose, UA: NEGATIVE mg/dL
Hgb urine dipstick: NEGATIVE
Ketones, ur: 80 mg/dL — AB
Leukocytes,Ua: NEGATIVE
Nitrite: NEGATIVE
Protein, ur: 30 mg/dL — AB
Specific Gravity, Urine: 1.014 (ref 1.005–1.030)
pH: 9 — ABNORMAL HIGH (ref 5.0–8.0)

## 2020-12-04 LAB — COMPREHENSIVE METABOLIC PANEL
ALT: 245 U/L — ABNORMAL HIGH (ref 0–44)
AST: 148 U/L — ABNORMAL HIGH (ref 15–41)
Albumin: 2.6 g/dL — ABNORMAL LOW (ref 3.5–5.0)
Alkaline Phosphatase: 102 U/L (ref 38–126)
Anion gap: 10 (ref 5–15)
BUN: 5 mg/dL — ABNORMAL LOW (ref 6–20)
CO2: 21 mmol/L — ABNORMAL LOW (ref 22–32)
Calcium: 8.8 mg/dL — ABNORMAL LOW (ref 8.9–10.3)
Chloride: 101 mmol/L (ref 98–111)
Creatinine, Ser: 0.51 mg/dL (ref 0.44–1.00)
GFR, Estimated: >60 mL/min (ref >60)
Glucose, Bld: 98 mg/dL (ref 70–99)
Potassium: 4 mmol/L (ref 3.5–5.1)
Sodium: 132 mmol/L — ABNORMAL LOW (ref 135–145)
Total Bilirubin: 1.7 mg/dL — ABNORMAL HIGH (ref 0.3–1.2)
Total Protein: 6.1 g/dL — ABNORMAL LOW (ref 6.5–8.1)

## 2020-12-04 MED ORDER — FAMOTIDINE IN NACL 20-0.9 MG/50ML-% IV SOLN
20.0000 mg | Freq: Once | INTRAVENOUS | Status: AC
  Administered 2020-12-04: 20 mg via INTRAVENOUS
  Filled 2020-12-04: qty 50

## 2020-12-04 MED ORDER — SODIUM CHLORIDE 0.9 % IV SOLN
8.0000 mg | Freq: Once | INTRAVENOUS | Status: AC
  Administered 2020-12-04: 8 mg via INTRAVENOUS
  Filled 2020-12-04: qty 4

## 2020-12-04 MED ORDER — LACTATED RINGERS IV BOLUS
1000.0000 mL | Freq: Once | INTRAVENOUS | Status: AC
  Administered 2020-12-04: 1000 mL via INTRAVENOUS

## 2020-12-04 MED ORDER — DIPHENHYDRAMINE HCL 50 MG/ML IJ SOLN
25.0000 mg | Freq: Once | INTRAMUSCULAR | Status: AC
  Administered 2020-12-04: 25 mg via INTRAVENOUS
  Filled 2020-12-04: qty 1

## 2020-12-04 NOTE — MAU Note (Signed)
Unable to void and would not get out of wc to be weighed when arrived.

## 2020-12-04 NOTE — MAU Provider Note (Signed)
History     CSN: [id number]  Arrival date and time: 12/04/20 1304   Event Date/Time   First Provider Initiated Contact with Patient 12/04/20 1520      Chief Complaint  Patient presents with   Emesis   Nausea   Back Pain   HPI  [name] is a 25 y.o.female G3P1011 @ [redacted]w[redacted]d here with nausea and vomiting. She was admitted recently to [facility] speciality for this problem. She was discharged home yesterday. She reports attempting to take medication this morning however vomited this up. She then put the medication in her rectum, however cannot tell me which medication she used. She reports trying 5 different medications without relief.   Reports not keeping anything down in the last 24 hours. Refused weigh in in triage Unable to urinate upon arrival.   OB History     Gravida  3   Para  1   Term  1   Preterm      AB  1   Living  1      SAB  1   IAB      Ectopic      Multiple  0   Live Births  1           Past Medical History:  Diagnosis Date   Depression    when going through hyperemesis with first preg, ok now   Hyperemesis gravidarum     Past Surgical History:  Procedure Laterality Date   CHOLECYSTECTOMY N/A 11/25/2020   Procedure: LAPAROSCOPIC CHOLECYSTECTOMY;  Surgeon: Harriette Bouillon, MD;  Location: [facility] OR;  Service: General;  Laterality: N/A;   DILATION AND CURETTAGE OF UTERUS N/A 07/15/2014   Procedure: SUCTION DILATATION AND CURETTAGE;  Surgeon: Lazaro Arms, MD;  Location: [facility]S;  Service: Gynecology;  Laterality: N/A;    Family History  Problem Relation Age of Onset   Healthy Mother    Aneurysm Father    Hypertension Maternal Grandmother    ADD / ADHD Neg Hx    Alcohol abuse Neg Hx    Anxiety disorder Neg Hx    Arthritis Neg Hx    Birth defects Neg Hx    Asthma Neg Hx    Cancer Neg Hx    COPD Neg Hx    Depression Neg Hx    Diabetes Neg Hx    Drug abuse Neg Hx    Early death Neg Hx    Hearing loss Neg Hx    Heart disease Neg  Hx    Hyperlipidemia Neg Hx    Intellectual disability Neg Hx    Kidney disease Neg Hx    Learning disabilities Neg Hx    Miscarriages / Stillbirths Neg Hx    Obesity Neg Hx    Stroke Neg Hx    Vision loss Neg Hx    Varicose Veins Neg Hx     Social History   Tobacco Use   Smoking status: Never   Smokeless tobacco: Never  Vaping Use   Vaping Use: Never used  Substance Use Topics   Alcohol use: No   Drug use: No    Allergies: No Known Allergies  Medications Prior to Admission  Medication Sig Dispense Refill Last Dose   metoCLOPramide (REGLAN) 10 MG tablet Take 1 tablet (10 mg total) by mouth 4 (four) times daily -  before meals and at bedtime. 90 tablet 2    ondansetron (ZOFRAN ODT) 8 MG disintegrating tablet Take 1 tablet (8 mg total)  by mouth every 8 (eight) hours as needed for refractory nausea / vomiting. 30 tablet 0    oxyCODONE (ROXICODONE) 5 MG/5ML solution Take 5-10 mLs (5-10 mg total) by mouth every 4 (four) hours as needed for moderate pain or severe pain (5mg  for moderate pain, 10mg  for severe pain). 120 mL 0    potassium chloride SA (KLOR-CON) 20 MEQ tablet Take 1 tablet (20 mEq total) by mouth 2 (two) times daily. 10 tablet 1    Prenatal MV-Min-FA-Omega-3 (PRENATAL GUMMIES/DHA & FA PO) Take 2 tablets by mouth daily.  (Patient not taking: Reported on 12/02/2020)      scopolamine (TRANSDERM-SCOP) 1 MG/3DAYS Place 1 patch (1.5 mg total) onto the skin every 3 (three) days. 10 patch 12    Results for orders placed or performed during the hospital encounter of 12/04/20 (from the past 48 hour(s))  Comprehensive metabolic panel     Status: Abnormal   Collection Time: 12/04/20  2:02 PM  Result Value Ref Range   Sodium 132 (L) 135 - 145 mmol/L   Potassium 4.0 3.5 - 5.1 mmol/L   Chloride 101 98 - 111 mmol/L   CO2 21 (L) 22 - 32 mmol/L   Glucose, Bld 98 70 - 99 mg/dL    Comment: Glucose reference range applies only to samples taken after fasting for at least 8 hours.   BUN 5  (L) 6 - 20 mg/dL   Creatinine, Ser 02/03/21 0.44 - 1.00 mg/dL   Calcium 8.8 (L) 8.9 - 10.3 mg/dL   Total Protein 6.1 (L) 6.5 - 8.1 g/dL   Albumin 2.6 (L) 3.5 - 5.0 g/dL   AST 02/03/21 (H) 15 - 41 U/L   ALT 245 (H) 0 - 44 U/L   Alkaline Phosphatase 102 38 - 126 U/L   Total Bilirubin 1.7 (H) 0.3 - 1.2 mg/dL   GFR, Estimated 1.77 939 mL/min    Comment: (NOTE) Calculated using the CKD-EPI Creatinine Equation (2021)    Anion gap 10 5 - 15    Comment: Performed at [facility], [address], [place], [address] [place]  Urinalysis, Routine w reflex microscopic Urine, Clean Catch     Status: Abnormal   Collection Time: 12/04/20  6:30 PM  Result Value Ref Range   Color, Urine YELLOW YELLOW   APPearance CLEAR CLEAR   Specific Gravity, Urine 1.014 1.005 - 1.030   pH 9.0 (H) 5.0 - 8.0   Glucose, UA NEGATIVE NEGATIVE mg/dL   Hgb urine dipstick NEGATIVE NEGATIVE   Bilirubin Urine NEGATIVE NEGATIVE   Ketones, ur 80 (A) NEGATIVE mg/dL   Protein, ur 30 (A) NEGATIVE mg/dL   Nitrite NEGATIVE NEGATIVE   Leukocytes,Ua NEGATIVE NEGATIVE   WBC, UA 0-5 0 - 5 WBC/hpf   Bacteria, UA RARE (A) NONE SEEN   Squamous Epithelial / LPF 0-5 0 - 5   Mucus PRESENT     Comment: Performed at [facility], [address], [place], [address] [place]    Review of Systems  Constitutional:  Negative for fever.  Gastrointestinal:  Positive for nausea and vomiting. Negative for abdominal pain and diarrhea.  Physical Exam   Blood pressure (!) 146/92, pulse (!) 109, temperature 97.6 F (36.4 C), temperature source Oral, resp. rate 20, last menstrual period 08/10/2020, SpO2 100 %, unknown if currently breastfeeding.  Physical Exam Constitutional:      General: She is not in acute distress.    Appearance: She is ill-appearing. She is not toxic-appearing.  HENT:     Head: Normocephalic.  Skin:    General: Skin is warm.  Neurological:     Mental Status: She is alert and oriented to person, place, and time.   Psychiatric:        Behavior: Behavior normal.   MAU Course  Procedures  MDM  Lr bolus x 2 Zofran 8 mg IV Pepcid 20 mg IV Patient up to urinate after 2nd liter of fluid and tolerated oral fluids without vomiting.   Assessment and Plan   A:  1. Hyperemesis   2. [redacted] weeks gestation of pregnancy      P:  Discharge home Continue home regimen of medication Encouraged the importance of taking medications as prescribed Small, frequent, bland meals  Bayler Gehrig, Harolyn Rutherford, NP 12/04/2020 7:51 PM

## 2020-12-04 NOTE — MAU Note (Signed)
Ongoing nausea and vomiting, throwing up everything she eats.  Back is hurting, constant and ongoing.

## 2020-12-06 ENCOUNTER — Encounter (HOSPITAL_COMMUNITY): Payer: Self-pay | Admitting: Obstetrics & Gynecology

## 2020-12-06 ENCOUNTER — Inpatient Hospital Stay (HOSPITAL_COMMUNITY)
Admission: AD | Admit: 2020-12-06 | Discharge: 2020-12-12 | DRG: 805 | Disposition: A | Discharge status: Home / Self Care | Payer: Medicaid Other | Attending: Obstetrics & Gynecology | Admitting: Obstetrics & Gynecology

## 2020-12-06 ENCOUNTER — Other Ambulatory Visit: Payer: Self-pay

## 2020-12-06 DIAGNOSIS — Z3490 Encounter for supervision of normal pregnancy, unspecified, unspecified trimester: Secondary | ICD-10-CM

## 2020-12-06 DIAGNOSIS — R1110 Vomiting, unspecified: Secondary | ICD-10-CM

## 2020-12-06 DIAGNOSIS — O020 Blighted ovum and nonhydatidiform mole: Secondary | ICD-10-CM | POA: Diagnosis present

## 2020-12-06 DIAGNOSIS — O1002 Pre-existing essential hypertension complicating childbirth: Secondary | ICD-10-CM | POA: Diagnosis present

## 2020-12-06 DIAGNOSIS — E872 Acidosis, unspecified: Secondary | ICD-10-CM | POA: Diagnosis present

## 2020-12-06 DIAGNOSIS — O019 Hydatidiform mole, unspecified: Secondary | ICD-10-CM

## 2020-12-06 DIAGNOSIS — D696 Thrombocytopenia, unspecified: Secondary | ICD-10-CM | POA: Diagnosis present

## 2020-12-06 DIAGNOSIS — E0590 Thyrotoxicosis, unspecified without thyrotoxic crisis or storm: Secondary | ICD-10-CM | POA: Diagnosis present

## 2020-12-06 DIAGNOSIS — Z9049 Acquired absence of other specified parts of digestive tract: Secondary | ICD-10-CM

## 2020-12-06 DIAGNOSIS — Z20822 Contact with and (suspected) exposure to covid-19: Secondary | ICD-10-CM | POA: Diagnosis present

## 2020-12-06 DIAGNOSIS — O210 Mild hyperemesis gravidarum: Secondary | ICD-10-CM | POA: Diagnosis not present

## 2020-12-06 DIAGNOSIS — O99282 Endocrine, nutritional and metabolic diseases complicating pregnancy, second trimester: Secondary | ICD-10-CM

## 2020-12-06 DIAGNOSIS — R7401 Elevation of levels of liver transaminase levels: Secondary | ICD-10-CM | POA: Diagnosis not present

## 2020-12-06 DIAGNOSIS — O114 Pre-existing hypertension with pre-eclampsia, complicating childbirth: Secondary | ICD-10-CM | POA: Diagnosis present

## 2020-12-06 DIAGNOSIS — O99891 Other specified diseases and conditions complicating pregnancy: Secondary | ICD-10-CM

## 2020-12-06 DIAGNOSIS — Z3A16 16 weeks gestation of pregnancy: Secondary | ICD-10-CM | POA: Diagnosis not present

## 2020-12-06 DIAGNOSIS — O99280 Endocrine, nutritional and metabolic diseases complicating pregnancy, unspecified trimester: Secondary | ICD-10-CM

## 2020-12-06 DIAGNOSIS — Z3A17 17 weeks gestation of pregnancy: Secondary | ICD-10-CM

## 2020-12-06 DIAGNOSIS — E43 Unspecified severe protein-calorie malnutrition: Secondary | ICD-10-CM | POA: Diagnosis present

## 2020-12-06 DIAGNOSIS — O99284 Endocrine, nutritional and metabolic diseases complicating childbirth: Secondary | ICD-10-CM | POA: Diagnosis present

## 2020-12-06 DIAGNOSIS — O252 Malnutrition in childbirth: Secondary | ICD-10-CM | POA: Diagnosis present

## 2020-12-06 DIAGNOSIS — Z4659 Encounter for fitting and adjustment of other gastrointestinal appliance and device: Secondary | ICD-10-CM

## 2020-12-06 DIAGNOSIS — O1412 Severe pre-eclampsia, second trimester: Secondary | ICD-10-CM

## 2020-12-06 DIAGNOSIS — R7989 Other specified abnormal findings of blood chemistry: Secondary | ICD-10-CM

## 2020-12-06 DIAGNOSIS — O211 Hyperemesis gravidarum with metabolic disturbance: Principal | ICD-10-CM | POA: Diagnosis present

## 2020-12-06 DIAGNOSIS — O9912 Other diseases of the blood and blood-forming organs and certain disorders involving the immune mechanism complicating childbirth: Secondary | ICD-10-CM | POA: Diagnosis present

## 2020-12-06 LAB — COMPREHENSIVE METABOLIC PANEL
ALT: 334 U/L — ABNORMAL HIGH (ref 0–44)
AST: 305 U/L — ABNORMAL HIGH (ref 15–41)
Albumin: 3 g/dL — ABNORMAL LOW (ref 3.5–5.0)
Alkaline Phosphatase: 122 U/L (ref 38–126)
Anion gap: 17 — ABNORMAL HIGH (ref 5–15)
BUN: 9 mg/dL (ref 6–20)
CO2: 20 mmol/L — ABNORMAL LOW (ref 22–32)
Calcium: 9.5 mg/dL (ref 8.9–10.3)
Chloride: 96 mmol/L — ABNORMAL LOW (ref 98–111)
Creatinine, Ser: 0.59 mg/dL (ref 0.44–1.00)
GFR, Estimated: >60 mL/min (ref >60)
Glucose, Bld: 79 mg/dL (ref 70–99)
Potassium: 3.7 mmol/L (ref 3.5–5.1)
Sodium: 133 mmol/L — ABNORMAL LOW (ref 135–145)
Total Bilirubin: 3.1 mg/dL — ABNORMAL HIGH (ref 0.3–1.2)
Total Protein: 7.3 g/dL (ref 6.5–8.1)

## 2020-12-06 LAB — URINALYSIS, ROUTINE W REFLEX MICROSCOPIC
Bilirubin Urine: NEGATIVE
Glucose, UA: NEGATIVE mg/dL
Hgb urine dipstick: NEGATIVE
Ketones, ur: 80 mg/dL — AB
Nitrite: NEGATIVE
Protein, ur: 100 mg/dL — AB
Specific Gravity, Urine: 1.021 (ref 1.005–1.030)
pH: 6 (ref 5.0–8.0)

## 2020-12-06 LAB — T4, FREE: Free T4: 5.36 ng/dL — ABNORMAL HIGH (ref 0.61–1.12)

## 2020-12-06 LAB — TSH: TSH: <0.01 u[IU]/mL — ABNORMAL LOW (ref 0.350–4.500)

## 2020-12-06 LAB — LIPASE, BLOOD: Lipase: 45 U/L (ref 11–51)

## 2020-12-06 LAB — SARS CORONAVIRUS 2 (TAT 6-24 HRS): SARS Coronavirus 2: NEGATIVE

## 2020-12-06 MED ORDER — LACTATED RINGERS IV BOLUS
1000.0000 mL | Freq: Once | INTRAVENOUS | Status: AC
  Administered 2020-12-06: 1000 mL via INTRAVENOUS

## 2020-12-06 MED ORDER — PRENATAL MULTIVITAMIN CH: 1.0000 | ORAL_TABLET | Freq: Every day | ORAL | Status: DC

## 2020-12-06 MED ORDER — MENTHOL 3 MG MT LOZG
1.0000 | LOZENGE | OROMUCOSAL | Status: DC | PRN
  Administered 2020-12-06: 3 mg via ORAL
  Filled 2020-12-06: qty 9

## 2020-12-06 MED ORDER — ZOLPIDEM TARTRATE 5 MG PO TABS
5.0000 mg | ORAL_TABLET | Freq: Every evening | ORAL | Status: DC | PRN
  Administered 2020-12-06 – 2020-12-08 (×2): 5 mg via ORAL
  Filled 2020-12-06 (×2): qty 1

## 2020-12-06 MED ORDER — DOCUSATE SODIUM 100 MG PO CAPS
100.0000 mg | ORAL_CAPSULE | Freq: Every day | ORAL | Status: DC
  Filled 2020-12-06 (×3): qty 1

## 2020-12-06 MED ORDER — SODIUM CHLORIDE 0.9 % IV SOLN
25.0000 mg | Freq: Four times a day (QID) | INTRAVENOUS | Status: DC | PRN
  Administered 2020-12-06 – 2020-12-11 (×5): 25 mg via INTRAVENOUS
  Filled 2020-12-06 (×7): qty 1

## 2020-12-06 MED ORDER — CALCIUM CARBONATE ANTACID 500 MG PO CHEW: 2.0000 | CHEWABLE_TABLET | ORAL | Status: DC | PRN

## 2020-12-06 MED ORDER — DEXTROSE IN LACTATED RINGERS 5 % IV SOLN: INTRAVENOUS | Status: AC

## 2020-12-06 MED ORDER — METHIMAZOLE 5 MG PO TABS
5.0000 mg | ORAL_TABLET | Freq: Two times a day (BID) | ORAL | Status: DC
  Administered 2020-12-06 – 2020-12-10 (×8): 5 mg via ORAL
  Filled 2020-12-06 (×10): qty 1

## 2020-12-06 MED ORDER — ONDANSETRON HCL 4 MG/2ML IJ SOLN
4.0000 mg | Freq: Four times a day (QID) | INTRAMUSCULAR | Status: DC | PRN
  Administered 2020-12-06 – 2020-12-08 (×6): 4 mg via INTRAVENOUS
  Filled 2020-12-06 (×6): qty 2

## 2020-12-06 MED ORDER — ENOXAPARIN SODIUM 40 MG/0.4ML IJ SOSY
40.0000 mg | PREFILLED_SYRINGE | INTRAMUSCULAR | Status: DC
Start: 2020-12-06 — End: 2020-12-11
  Administered 2020-12-06 – 2020-12-10 (×5): 40 mg via SUBCUTANEOUS
  Filled 2020-12-06 (×5): qty 0.4

## 2020-12-06 MED ORDER — METOCLOPRAMIDE HCL 5 MG/ML IJ SOLN
10.0000 mg | Freq: Four times a day (QID) | INTRAMUSCULAR | Status: DC
  Administered 2020-12-06 – 2020-12-11 (×19): 10 mg via INTRAVENOUS
  Filled 2020-12-06 (×19): qty 2

## 2020-12-06 MED ORDER — PANTOPRAZOLE SODIUM 40 MG IV SOLR
40.0000 mg | Freq: Once | INTRAVENOUS | Status: AC
  Administered 2020-12-06: 40 mg via INTRAVENOUS
  Filled 2020-12-06: qty 40

## 2020-12-06 MED ORDER — ACETAMINOPHEN 325 MG PO TABS: 650.0000 mg | ORAL_TABLET | ORAL | Status: DC | PRN

## 2020-12-06 MED ORDER — SODIUM CHLORIDE 0.9 % IV SOLN
8.0000 mg | Freq: Once | INTRAVENOUS | Status: AC
  Administered 2020-12-06: 8 mg via INTRAVENOUS
  Filled 2020-12-06: qty 4

## 2020-12-06 MED ORDER — DEXTROSE IN LACTATED RINGERS 5 % IV SOLN: INTRAVENOUS | Status: DC

## 2020-12-06 MED ORDER — SCOPOLAMINE 1 MG/3DAYS TD PT72
1.0000 | MEDICATED_PATCH | TRANSDERMAL | Status: DC
  Administered 2020-12-06 – 2020-12-09 (×2): 1.5 mg via TRANSDERMAL
  Filled 2020-12-06 (×2): qty 1

## 2020-12-06 NOTE — MAU Note (Signed)
Presents for N/V, reports unable to keep anything down despite taking meds as prescribed.  States Home Health hadn' t arrived to see her as scheduled but she was told they were still coming.

## 2020-12-06 NOTE — Progress Notes (Signed)
Patient ID: Deborah Evans, female   DOB: 1995/05/31, 25 y.o.   MRN: [id number]  Patient hyperthyroid - will start methimazole 5mg  BID. Per pharmacy, may crush.   , DO 12/06/2020 5:00 PM

## 2020-12-06 NOTE — MAU Provider Note (Addendum)
History     CSN: [id number]  Arrival date and time: 12/06/20 1141   Event Date/Time   First Provider Initiated Contact with Patient 12/06/20 1232      Chief Complaint  Patient presents with   Emesis   Nausea   HPI This is a 25yo G3P1011 at [redacted]w[redacted]d with hyperemesis gravidum. She is status post cholecystectomy on 7/28 for symptomatic gallbladder disease. She was discharged on 8/2 from that, but has continued to have nausea and vomiting. She was readmitted on 8/4 and discharged the next day for her hyperemesis. She was rechecked in the MAU 2 days ago for similar complaints - was given 2L IVF and sent home. She has been trying to take zofran and reglan, but intolerant of both food and liquid. She had a scopolamine patch that she removed yesterday, but does not have any more refills.   OB History     Gravida  3   Para  1   Term  1   Preterm      AB  1   Living  1      SAB  1   IAB      Ectopic      Multiple  0   Live Births  1           Past Medical History:  Diagnosis Date   Depression    when going through hyperemesis with first preg, ok now   Hyperemesis gravidarum     Past Surgical History:  Procedure Laterality Date   CHOLECYSTECTOMY N/A 11/25/2020   Procedure: LAPAROSCOPIC CHOLECYSTECTOMY;  Surgeon: Harriette Bouillon, MD;  Location: [facility];  Service: General;  Laterality: N/A;   DILATION AND CURETTAGE OF UTERUS N/A 07/15/2014   Procedure: SUCTION DILATATION AND CURETTAGE;  Surgeon: Lazaro Arms, MD;  Location: [facility];  Service: Gynecology;  Laterality: N/A;    Family History  Problem Relation Age of Onset   Healthy Mother    Aneurysm Father    Hypertension Maternal Grandmother    ADD / ADHD Neg Hx    Alcohol abuse Neg Hx    Anxiety disorder Neg Hx    Arthritis Neg Hx    Birth defects Neg Hx    Asthma Neg Hx    Cancer Neg Hx    COPD Neg Hx    Depression Neg Hx    Diabetes Neg Hx    Drug abuse Neg Hx    Early death Neg Hx    Hearing loss Neg  Hx    Heart disease Neg Hx    Hyperlipidemia Neg Hx    Intellectual disability Neg Hx    Kidney disease Neg Hx    Learning disabilities Neg Hx    Miscarriages / Stillbirths Neg Hx    Obesity Neg Hx    Stroke Neg Hx    Vision loss Neg Hx    Varicose Veins Neg Hx     Social History   Tobacco Use   Smoking status: Never   Smokeless tobacco: Never  Vaping Use   Vaping Use: Never used  Substance Use Topics   Alcohol use: No   Drug use: No    Allergies: No Known Allergies  Medications Prior to Admission  Medication Sig Dispense Refill Last Dose   metoCLOPramide (REGLAN) 10 MG tablet Take 1 tablet (10 mg total) by mouth 4 (four) times daily -  before meals and at bedtime. 90 tablet 2 12/06/2020 at 0930   ondansetron (ZOFRAN ODT)  8 MG disintegrating tablet Take 1 tablet (8 mg total) by mouth every 8 (eight) hours as needed for refractory nausea / vomiting. 30 tablet 0 12/06/2020 at 0930   scopolamine (TRANSDERM-SCOP) 1 MG/3DAYS Place 1 patch (1.5 mg total) onto the skin every 3 (three) days. 10 patch 12 12/05/2020   oxyCODONE (ROXICODONE) 5 MG/5ML solution Take 5-10 mLs (5-10 mg total) by mouth every 4 (four) hours as needed for moderate pain or severe pain (5mg  for moderate pain, 10mg  for severe pain). 120 mL 0    potassium chloride SA (KLOR-CON) 20 MEQ tablet Take 1 tablet (20 mEq total) by mouth 2 (two) times daily. 10 tablet 1    Prenatal MV-Min-FA-Omega-3 (PRENATAL GUMMIES/DHA & FA PO) Take 2 tablets by mouth daily.  (Patient not taking: Reported on 12/02/2020)       Review of Systems  Constitutional:  Negative for chills and fever.  Gastrointestinal:  Negative for abdominal distention and abdominal pain.  All other systems reviewed and are negative. Physical Exam   Blood pressure (!) 145/67, pulse (!) 111, temperature 98.4 F (36.9 C), temperature source Oral, resp. rate 18, last menstrual period 08/10/2020, SpO2 97 %, unknown if currently breastfeeding.  Physical Exam Vitals and  nursing note reviewed.  Constitutional:      Appearance: Normal appearance.  HENT:     Head: Normocephalic and atraumatic.  Cardiovascular:     Rate and Rhythm: Normal rate and regular rhythm.     Pulses: Normal pulses.     Heart sounds: Normal heart sounds.  Pulmonary:     Effort: Pulmonary effort is normal.     Breath sounds: Normal breath sounds.  Abdominal:     General: Abdomen is flat.  Skin:    General: Skin is warm.     Capillary Refill: Capillary refill takes less than 2 seconds.  Neurological:     General: No focal deficit present.     Mental Status: She is alert.  Psychiatric:        Mood and Affect: Mood normal.        Behavior: Behavior normal.        Thought Content: Thought content normal.   Results for orders placed or performed during the hospital encounter of 12/06/20 (from the past 24 hour(s))  Urinalysis, Routine w reflex microscopic Urine, Clean Catch     Status: Abnormal   Collection Time: 12/06/20 11:57 AM  Result Value Ref Range   Color, Urine AMBER (A) YELLOW   APPearance HAZY (A) CLEAR   Specific Gravity, Urine 1.021 1.005 - 1.030   pH 6.0 5.0 - 8.0   Glucose, UA NEGATIVE NEGATIVE mg/dL   Hgb urine dipstick NEGATIVE NEGATIVE   Bilirubin Urine NEGATIVE NEGATIVE   Ketones, ur 80 (A) NEGATIVE mg/dL   Protein, ur 02/05/21 (A) NEGATIVE mg/dL   Nitrite NEGATIVE NEGATIVE   Leukocytes,Ua SMALL (A) NEGATIVE   RBC / HPF 0-5 0 - 5 RBC/hpf   WBC, UA 21-50 0 - 5 WBC/hpf   Bacteria, UA RARE (A) NONE SEEN   Squamous Epithelial / LPF 0-5 0 - 5   Mucus PRESENT    Hyaline Casts, UA PRESENT   Comprehensive metabolic panel     Status: Abnormal   Collection Time: 12/06/20 12:52 PM  Result Value Ref Range   Sodium 133 (L) 135 - 145 mmol/L   Potassium 3.7 3.5 - 5.1 mmol/L   Chloride 96 (L) 98 - 111 mmol/L   CO2 20 (L) 22 - 32  mmol/L   Glucose, Bld 79 70 - 99 mg/dL   BUN 9 6 - 20 mg/dL   Creatinine, Ser 0.59 0.44 - 1.00 mg/dL   Calcium 9.5 8.9 - 10.3 mg/dL   Total  Protein 7.3 6.5 - 8.1 g/dL   Albumin 3.0 (L) 3.5 - 5.0 g/dL   AST 305 (H) 15 - 41 U/L   ALT 334 (H) 0 - 44 U/L   Alkaline Phosphatase 122 38 - 126 U/L   Total Bilirubin 3.1 (H) 0.3 - 1.2 mg/dL   GFR, Estimated >60 >60 mL/min   Anion gap 17 (H) 5 - 15  Lipase, blood     Status: None   Collection Time: 12/06/20 12:52 PM  Result Value Ref Range   Lipase 45 11 - 51 U/L     MAU Course  Procedures  MDM  Assessment and Plan   1. Hyperemesis   2. Elevated LFTs   3. Metabolic acidosis   4. [redacted] weeks gestation of pregnancy    Patient not improved after 2 L bolus and Zofran 8 mg IV.  Her labs indicate a gapped metabolic acidosis which, in the setting of hyperemesis, is likely starvation ketoacidosis.  We will switch IV fluids to D5 LR at 150 mL an hour.  We will place the patient on the antenatal floor under observation.  Recheck CMP later on today and tomorrow morning.  I suspect that this will resolve quickly.  Additionally, the patient's LFTs have increased from 150s to 300s.  This is likely secondary to hyperemesis.  The patient is status-post cholecystectomy, so acute obstruction is unlikely.  However, if LFTs remain elevated despite rehydration, may need to repeat right upper quadrant ultrasound and consult GI.  Continue Zofran, Phenergan, scopolamine patch, Reglan.  Deborah Evans 12/06/2020, 12:32 PM   

## 2020-12-06 NOTE — H&P (Signed)
History     CSN: [id number]  Arrival date and time: 12/06/20 1141   Event Date/Time   First Provider Initiated Contact with Patient 12/06/20 1232      Chief Complaint  Patient presents with   Emesis   Nausea   HPI This is a 25yo G3P1011 at [redacted]w[redacted]d with hyperemesis gravidum. She is status post cholecystectomy on 7/28 for symptomatic gallbladder disease. She was discharged on 8/2 from that, but has continued to have nausea and vomiting. She was readmitted on 8/4 and discharged the next day for her hyperemesis. She was rechecked in the MAU 2 days ago for similar complaints - was given 2L IVF and sent home. She has been trying to take zofran and reglan, but intolerant of both food and liquid. She had a scopolamine patch that she removed yesterday, but does not have any more refills.   OB History     Gravida  3   Para  1   Term  1   Preterm      AB  1   Living  1      SAB  1   IAB      Ectopic      Multiple  0   Live Births  1           Past Medical History:  Diagnosis Date   Depression    when going through hyperemesis with first preg, ok now   Hyperemesis gravidarum     Past Surgical History:  Procedure Laterality Date   CHOLECYSTECTOMY N/A 11/25/2020   Procedure: LAPAROSCOPIC CHOLECYSTECTOMY;  Surgeon: Harriette Bouillon, MD;  Location: [facility];  Service: General;  Laterality: N/A;   DILATION AND CURETTAGE OF UTERUS N/A 07/15/2014   Procedure: SUCTION DILATATION AND CURETTAGE;  Surgeon: Lazaro Arms, MD;  Location: [facility];  Service: Gynecology;  Laterality: N/A;    Family History  Problem Relation Age of Onset   Healthy Mother    Aneurysm Father    Hypertension Maternal Grandmother    ADD / ADHD Neg Hx    Alcohol abuse Neg Hx    Anxiety disorder Neg Hx    Arthritis Neg Hx    Birth defects Neg Hx    Asthma Neg Hx    Cancer Neg Hx    COPD Neg Hx    Depression Neg Hx    Diabetes Neg Hx    Drug abuse Neg Hx    Early death Neg Hx    Hearing loss Neg  Hx    Heart disease Neg Hx    Hyperlipidemia Neg Hx    Intellectual disability Neg Hx    Kidney disease Neg Hx    Learning disabilities Neg Hx    Miscarriages / Stillbirths Neg Hx    Obesity Neg Hx    Stroke Neg Hx    Vision loss Neg Hx    Varicose Veins Neg Hx     Social History   Tobacco Use   Smoking status: Never   Smokeless tobacco: Never  Vaping Use   Vaping Use: Never used  Substance Use Topics   Alcohol use: No   Drug use: No    Allergies: No Known Allergies  Medications Prior to Admission  Medication Sig Dispense Refill Last Dose   metoCLOPramide (REGLAN) 10 MG tablet Take 1 tablet (10 mg total) by mouth 4 (four) times daily -  before meals and at bedtime. 90 tablet 2 12/06/2020 at 0930   ondansetron (ZOFRAN ODT)  8 MG disintegrating tablet Take 1 tablet (8 mg total) by mouth every 8 (eight) hours as needed for refractory nausea / vomiting. 30 tablet 0 12/06/2020 at 0930   scopolamine (TRANSDERM-SCOP) 1 MG/3DAYS Place 1 patch (1.5 mg total) onto the skin every 3 (three) days. 10 patch 12 12/05/2020   oxyCODONE (ROXICODONE) 5 MG/5ML solution Take 5-10 mLs (5-10 mg total) by mouth every 4 (four) hours as needed for moderate pain or severe pain (5mg  for moderate pain, 10mg  for severe pain). 120 mL 0    potassium chloride SA (KLOR-CON) 20 MEQ tablet Take 1 tablet (20 mEq total) by mouth 2 (two) times daily. 10 tablet 1    Prenatal MV-Min-FA-Omega-3 (PRENATAL GUMMIES/DHA & FA PO) Take 2 tablets by mouth daily.  (Patient not taking: Reported on 12/02/2020)       Review of Systems  Constitutional:  Negative for chills and fever.  Gastrointestinal:  Negative for abdominal distention and abdominal pain.  All other systems reviewed and are negative. Physical Exam   Blood pressure (!) 145/67, pulse (!) 111, temperature 98.4 F (36.9 C), temperature source Oral, resp. rate 18, last menstrual period 08/10/2020, SpO2 97 %, unknown if currently breastfeeding.  Physical Exam Vitals and  nursing note reviewed.  Constitutional:      Appearance: Normal appearance.  HENT:     Head: Normocephalic and atraumatic.  Cardiovascular:     Rate and Rhythm: Normal rate and regular rhythm.     Pulses: Normal pulses.     Heart sounds: Normal heart sounds.  Pulmonary:     Effort: Pulmonary effort is normal.     Breath sounds: Normal breath sounds.  Abdominal:     General: Abdomen is flat.  Skin:    General: Skin is warm.     Capillary Refill: Capillary refill takes less than 2 seconds.  Neurological:     General: No focal deficit present.     Mental Status: She is alert.  Psychiatric:        Mood and Affect: Mood normal.        Behavior: Behavior normal.        Thought Content: Thought content normal.   Results for orders placed or performed during the hospital encounter of 12/06/20 (from the past 24 hour(s))  Urinalysis, Routine w reflex microscopic Urine, Clean Catch     Status: Abnormal   Collection Time: 12/06/20 11:57 AM  Result Value Ref Range   Color, Urine AMBER (A) YELLOW   APPearance HAZY (A) CLEAR   Specific Gravity, Urine 1.021 1.005 - 1.030   pH 6.0 5.0 - 8.0   Glucose, UA NEGATIVE NEGATIVE mg/dL   Hgb urine dipstick NEGATIVE NEGATIVE   Bilirubin Urine NEGATIVE NEGATIVE   Ketones, ur 80 (A) NEGATIVE mg/dL   Protein, ur 02/05/21 (A) NEGATIVE mg/dL   Nitrite NEGATIVE NEGATIVE   Leukocytes,Ua SMALL (A) NEGATIVE   RBC / HPF 0-5 0 - 5 RBC/hpf   WBC, UA 21-50 0 - 5 WBC/hpf   Bacteria, UA RARE (A) NONE SEEN   Squamous Epithelial / LPF 0-5 0 - 5   Mucus PRESENT    Hyaline Casts, UA PRESENT   Comprehensive metabolic panel     Status: Abnormal   Collection Time: 12/06/20 12:52 PM  Result Value Ref Range   Sodium 133 (L) 135 - 145 mmol/L   Potassium 3.7 3.5 - 5.1 mmol/L   Chloride 96 (L) 98 - 111 mmol/L   CO2 20 (L) 22 - 32  mmol/L   Glucose, Bld 79 70 - 99 mg/dL   BUN 9 6 - 20 mg/dL   Creatinine, Ser 8.25 0.44 - 1.00 mg/dL   Calcium 9.5 8.9 - 05.3 mg/dL   Total  Protein 7.3 6.5 - 8.1 g/dL   Albumin 3.0 (L) 3.5 - 5.0 g/dL   AST 976 (H) 15 - 41 U/L   ALT 334 (H) 0 - 44 U/L   Alkaline Phosphatase 122 38 - 126 U/L   Total Bilirubin 3.1 (H) 0.3 - 1.2 mg/dL   GFR, Estimated >73 >41 mL/min   Anion gap 17 (H) 5 - 15  Lipase, blood     Status: None   Collection Time: 12/06/20 12:52 PM  Result Value Ref Range   Lipase 45 11 - 51 U/L     MAU Course  Procedures  MDM  Assessment and Plan   1. Hyperemesis   2. Elevated LFTs   3. Metabolic acidosis   4. [redacted] weeks gestation of pregnancy    Patient not improved after 2 L bolus and Zofran 8 mg IV.  Her labs indicate a gapped metabolic acidosis which, in the setting of hyperemesis, is likely starvation ketoacidosis.  We will switch IV fluids to D5 LR at 150 mL an hour.  We will place the patient on the antenatal floor under observation.  Recheck CMP later on today and tomorrow morning.  I suspect that this will resolve quickly.  Additionally, the patient's LFTs have increased from 150s to 300s.  This is likely secondary to hyperemesis.  The patient is status-post cholecystectomy, so acute obstruction is unlikely.  However, if LFTs remain elevated despite rehydration, may need to repeat right upper quadrant ultrasound and consult GI.  Continue Zofran, Phenergan, scopolamine patch, Reglan.  Levie Heritage 12/06/2020, 12:32 PM

## 2020-12-07 DIAGNOSIS — Z3A17 17 weeks gestation of pregnancy: Secondary | ICD-10-CM | POA: Diagnosis not present

## 2020-12-07 DIAGNOSIS — Z20822 Contact with and (suspected) exposure to covid-19: Secondary | ICD-10-CM | POA: Diagnosis not present

## 2020-12-07 DIAGNOSIS — O114 Pre-existing hypertension with pre-eclampsia, complicating childbirth: Secondary | ICD-10-CM | POA: Diagnosis not present

## 2020-12-07 DIAGNOSIS — R7989 Other specified abnormal findings of blood chemistry: Secondary | ICD-10-CM

## 2020-12-07 DIAGNOSIS — O020 Blighted ovum and nonhydatidiform mole: Secondary | ICD-10-CM | POA: Diagnosis not present

## 2020-12-07 DIAGNOSIS — O211 Hyperemesis gravidarum with metabolic disturbance: Secondary | ICD-10-CM | POA: Diagnosis not present

## 2020-12-07 DIAGNOSIS — O210 Mild hyperemesis gravidarum: Secondary | ICD-10-CM | POA: Diagnosis present

## 2020-12-07 DIAGNOSIS — R7401 Elevation of levels of liver transaminase levels: Secondary | ICD-10-CM | POA: Diagnosis not present

## 2020-12-07 DIAGNOSIS — O99284 Endocrine, nutritional and metabolic diseases complicating childbirth: Secondary | ICD-10-CM | POA: Diagnosis not present

## 2020-12-07 DIAGNOSIS — Z3A16 16 weeks gestation of pregnancy: Secondary | ICD-10-CM | POA: Diagnosis not present

## 2020-12-07 DIAGNOSIS — O252 Malnutrition in childbirth: Secondary | ICD-10-CM | POA: Diagnosis not present

## 2020-12-07 DIAGNOSIS — E43 Unspecified severe protein-calorie malnutrition: Secondary | ICD-10-CM | POA: Diagnosis not present

## 2020-12-07 DIAGNOSIS — Z9049 Acquired absence of other specified parts of digestive tract: Secondary | ICD-10-CM | POA: Diagnosis not present

## 2020-12-07 DIAGNOSIS — E0590 Thyrotoxicosis, unspecified without thyrotoxic crisis or storm: Secondary | ICD-10-CM | POA: Diagnosis not present

## 2020-12-07 DIAGNOSIS — O99282 Endocrine, nutritional and metabolic diseases complicating pregnancy, second trimester: Secondary | ICD-10-CM | POA: Diagnosis not present

## 2020-12-07 DIAGNOSIS — O9912 Other diseases of the blood and blood-forming organs and certain disorders involving the immune mechanism complicating childbirth: Secondary | ICD-10-CM | POA: Diagnosis not present

## 2020-12-07 DIAGNOSIS — D696 Thrombocytopenia, unspecified: Secondary | ICD-10-CM | POA: Diagnosis not present

## 2020-12-07 DIAGNOSIS — O1002 Pre-existing essential hypertension complicating childbirth: Secondary | ICD-10-CM | POA: Diagnosis not present

## 2020-12-07 DIAGNOSIS — E872 Acidosis: Secondary | ICD-10-CM | POA: Diagnosis not present

## 2020-12-07 DIAGNOSIS — R1110 Vomiting, unspecified: Secondary | ICD-10-CM | POA: Diagnosis not present

## 2020-12-07 DIAGNOSIS — O99280 Endocrine, nutritional and metabolic diseases complicating pregnancy, unspecified trimester: Secondary | ICD-10-CM | POA: Diagnosis not present

## 2020-12-07 DIAGNOSIS — O99891 Other specified diseases and conditions complicating pregnancy: Secondary | ICD-10-CM | POA: Diagnosis not present

## 2020-12-07 LAB — COMPREHENSIVE METABOLIC PANEL
ALT: 247 U/L — ABNORMAL HIGH (ref 0–44)
AST: 260 U/L — ABNORMAL HIGH (ref 15–41)
Albumin: 1.8 g/dL — ABNORMAL LOW (ref 3.5–5.0)
Alkaline Phosphatase: 84 U/L (ref 38–126)
Anion gap: 6 (ref 5–15)
BUN: 5 mg/dL — ABNORMAL LOW (ref 6–20)
CO2: 24 mmol/L (ref 22–32)
Calcium: 7.8 mg/dL — ABNORMAL LOW (ref 8.9–10.3)
Chloride: 104 mmol/L (ref 98–111)
Creatinine, Ser: 0.51 mg/dL (ref 0.44–1.00)
GFR, Estimated: >60 mL/min (ref >60)
Glucose, Bld: 95 mg/dL (ref 70–99)
Potassium: 3 mmol/L — ABNORMAL LOW (ref 3.5–5.1)
Sodium: 134 mmol/L — ABNORMAL LOW (ref 135–145)
Total Bilirubin: 2.1 mg/dL — ABNORMAL HIGH (ref 0.3–1.2)
Total Protein: 4.6 g/dL — ABNORMAL LOW (ref 6.5–8.1)

## 2020-12-07 LAB — T3, FREE: T3, Free: 22.5 pg/mL — ABNORMAL HIGH (ref 2.0–4.4)

## 2020-12-07 MED ORDER — LABETALOL HCL 200 MG PO TABS
200.0000 mg | ORAL_TABLET | Freq: Two times a day (BID) | ORAL | Status: DC
  Administered 2020-12-07 – 2020-12-08 (×3): 200 mg via ORAL
  Filled 2020-12-07 (×3): qty 1

## 2020-12-07 MED ORDER — ORAL CARE MOUTH RINSE
15.0000 mL | Freq: Two times a day (BID) | OROMUCOSAL | Status: DC
  Administered 2020-12-08 – 2020-12-09 (×2): 15 mL via OROMUCOSAL

## 2020-12-07 MED ORDER — DEXTROSE IN LACTATED RINGERS 5 % IV SOLN: INTRAVENOUS | Status: DC

## 2020-12-07 MED ORDER — POTASSIUM CHLORIDE 10 MEQ/100ML IV SOLN
INTRAVENOUS | Status: AC
  Administered 2020-12-07: 10 meq via INTRAVENOUS
  Filled 2020-12-07: qty 100

## 2020-12-07 MED ORDER — POTASSIUM CHLORIDE 10 MEQ/100ML IV SOLN
10.0000 meq | INTRAVENOUS | Status: AC
  Administered 2020-12-07 (×5): 10 meq via INTRAVENOUS
  Filled 2020-12-07 (×5): qty 100

## 2020-12-07 NOTE — Progress Notes (Signed)
FACULTY PRACTICE ANTEPARTUM COMPREHENSIVE PROGRESS NOTE  Deborah Evans is a 25 y.o. G3P1011 at [redacted]w[redacted]d who is admitted for HEG with elevated LFTs and metabolic acidosis with AG.  Estimated Date of Delivery: 05/17/21 by LMP and [place]   Length of Stay:  0 Days. Admitted 12/06/2020  Subjective:  She continues to feel nausea and had one episode of emesis this morning of brown material. Denies hematemesis. She denies bowel movement. She notes little to no urination. She does not feel improved and notes generally she just slowly feels better with time.   She denies VB  Vitals:  Blood pressure (!) 158/107, pulse 99, temperature 98 F (36.7 C), temperature source Oral, resp. rate 18, last menstrual period 08/10/2020, SpO2 100 %, unknown if currently breastfeeding. Physical Examination: CONSTITUTIONAL: Well-developed, well-nourished female in no acute distress.  NEUROLOGIC: Alert and oriented to person, place, and time. No cranial nerve deficit noted. PSYCHIATRIC: Normal mood and flat affect. Normal behavior. Normal judgment and thought content. CARDIOVASCULAR: Normal heart rate noted RESPIRATORY: Effort normal, no problems with respiration noted MUSCULOSKELETAL: Normal range of motion. No edema and no tenderness. 2+ distal pulses. ABDOMEN: Soft, nontender, nondistended, gravid. FH appropriate for gestational age   Results for orders placed or performed during the hospital encounter of 12/06/20 (from the past 48 hour(s))  Urinalysis, Routine w reflex microscopic Urine, Clean Catch     Status: Abnormal   Collection Time: 12/06/20 11:57 AM  Result Value Ref Range   Color, Urine AMBER (A) YELLOW    Comment: BIOCHEMICALS MAY BE AFFECTED BY COLOR   APPearance HAZY (A) CLEAR   Specific Gravity, Urine 1.021 1.005 - 1.030   pH 6.0 5.0 - 8.0   Glucose, UA NEGATIVE NEGATIVE mg/dL   Hgb urine dipstick NEGATIVE NEGATIVE   Bilirubin Urine NEGATIVE NEGATIVE   Ketones, ur 80 (A) NEGATIVE mg/dL   Protein, ur  858 (A) NEGATIVE mg/dL   Nitrite NEGATIVE NEGATIVE   Leukocytes,Ua SMALL (A) NEGATIVE   RBC / HPF 0-5 0 - 5 RBC/hpf   WBC, UA 21-50 0 - 5 WBC/hpf   Bacteria, UA RARE (A) NONE SEEN   Squamous Epithelial / LPF 0-5 0 - 5   Mucus PRESENT    Hyaline Casts, UA PRESENT     Comment: Performed at [facility], [address], [place], [place] [address]  Comprehensive metabolic panel     Status: Abnormal   Collection Time: 12/06/20 12:52 PM  Result Value Ref Range   Sodium 133 (L) 135 - 145 mmol/L   Potassium 3.7 3.5 - 5.1 mmol/L   Chloride 96 (L) 98 - 111 mmol/L   CO2 20 (L) 22 - 32 mmol/L   Glucose, Bld 79 70 - 99 mg/dL    Comment: Glucose reference range applies only to samples taken after fasting for at least 8 hours.   BUN 9 6 - 20 mg/dL   Creatinine, Ser 7.41 0.44 - 1.00 mg/dL   Calcium 9.5 8.9 - 28.7 mg/dL   Total Protein 7.3 6.5 - 8.1 g/dL   Albumin 3.0 (L) 3.5 - 5.0 g/dL   AST 867 (H) 15 - 41 U/L   ALT 334 (H) 0 - 44 U/L   Alkaline Phosphatase 122 38 - 126 U/L   Total Bilirubin 3.1 (H) 0.3 - 1.2 mg/dL   GFR, Estimated >67 >20 mL/min    Comment: (NOTE) Calculated using the CKD-EPI Creatinine Equation (2021)    Anion gap 17 (H) 5 - 15  Comment: Performed at [facility], [address], [place], [place] [address]  Lipase, blood     Status: None   Collection Time: 12/06/20 12:52 PM  Result Value Ref Range   Lipase 45 11 - 51 U/L    Comment: Performed at [facility], [address] [place], [place] [address]  TSH     Status: Abnormal   Collection Time: 12/06/20 12:52 PM  Result Value Ref Range   TSH <0.010 (L) 0.350 - 4.500 uIU/mL    Comment: Performed by a 3rd Generation assay with a functional sensitivity of <=0.01 uIU/mL. Performed at [facility], [address], [place], [place] [address]   T3, free     Status: Abnormal   Collection Time: 12/06/20 12:52 PM  Result Value Ref Range   T3, Free 22.5 (H) 2.0 - 4.4 pg/mL    Comment:  (NOTE) Performed At: [facility] [address] [place], [place] [id number] Jolene Schimke MD JI:[id number]   T4, free     Status: Abnormal   Collection Time: 12/06/20 12:52 PM  Result Value Ref Range   Free T4 5.36 (H) 0.61 - 1.12 ng/dL    Comment: (NOTE) Biotin ingestion may interfere with free T4 tests. If the results are inconsistent with the TSH level, previous test results, or the clinical presentation, then consider biotin interference. If needed, order repeat testing after stopping biotin. Performed at [facility], [address], [place], [place] [address]   SARS CORONAVIRUS 2 (TAT 6-24 HRS) Nasopharyngeal Nasopharyngeal Swab     Status: None   Collection Time: 12/06/20  3:40 PM   Specimen: Nasopharyngeal Swab  Result Value Ref Range   SARS Coronavirus 2 NEGATIVE NEGATIVE    Comment: (NOTE) SARS-CoV-2 target nucleic acids are NOT DETECTED.  The SARS-CoV-2 RNA is generally detectable in upper and lower respiratory specimens during the acute phase of infection. Negative results do not preclude SARS-CoV-2 infection, do not rule out co-infections with other pathogens, and should not be used as the sole basis for treatment or other patient management decisions. Negative results must be combined with clinical observations, patient history, and epidemiological information. The expected result is Negative.  Fact Sheet for Patients: HairSlick.no  Fact Sheet for Healthcare Providers: quierodirigir.com  This test is not yet approved or cleared by the [place] FDA and  has been authorized for detection and/or diagnosis of SARS-CoV-2 by FDA under an Emergency Use Authorization (EUA). This EUA will remain  in effect (meaning this test can be used) for the duration of the COVID-19 declaration under Se ction 564(b)(1) of the Act, 21 U.S.C. section 360bbb-3(b)(1), unless the authorization is terminated  or revoked sooner.  Performed at [facility], [address], [place], [place] [address]   Comprehensive metabolic panel     Status: Abnormal   Collection Time: 12/07/20  4:55 AM  Result Value Ref Range   Sodium 134 (L) 135 - 145 mmol/L   Potassium 3.0 (L) 3.5 - 5.1 mmol/L   Chloride 104 98 - 111 mmol/L   CO2 24 22 - 32 mmol/L   Glucose, Bld 95 70 - 99 mg/dL    Comment: Glucose reference range applies only to samples taken after fasting for at least 8 hours.   BUN 5 (L) 6 - 20 mg/dL   Creatinine, Ser 7.82 0.44 - 1.00 mg/dL   Calcium 7.8 (L) 8.9 - 10.3 mg/dL   Total Protein 4.6 (L) 6.5 -  8.1 g/dL   Albumin 1.8 (L) 3.5 - 5.0 g/dL   AST 371 (H) 15 - 41 U/L   ALT 247 (H) 0 - 44 U/L   Alkaline Phosphatase 84 38 - 126 U/L   Total Bilirubin 2.1 (H) 0.3 - 1.2 mg/dL   GFR, Estimated >69 >67 mL/min    Comment: (NOTE) Calculated using the CKD-EPI Creatinine Equation (2021)    Anion gap 6 5 - 15    Comment: Performed at [facility], [address], [place], [place] [address]    No results found.  Current scheduled medications  docusate sodium  100 mg Oral Daily   enoxaparin (LOVENOX) injection  40 mg Subcutaneous Q24H   methimazole  5 mg Oral BID   metoCLOPramide (REGLAN) injection  10 mg Intravenous Q6H   prenatal multivitamin  1 tablet Oral Q1200   scopolamine  1 patch Transdermal Q72H    I have reviewed the patient's current medications.  ASSESSMENT: Active Problems:   Hyperemesis affecting pregnancy, antepartum   [redacted] weeks gestation of pregnancy   Elevated LFTs   Metabolic acidosis   Hyperthyroidism in pregnancy, antepartum   PLAN: Hyperemesis - Continue anti-emetics: Scop patch, scheduled Reglan, prn Zofran, prn phenergan. While patient still feels nauseated, she is having fewer episodes of emesis - Consider tube feeds if unable to tolerate any PO  2. Elevated LFTs - Improved from admission, but will continue to trend. If increasing, would consider  RUQ [place] and GI consult.  - s/p cholecystectomy on 7/28  3. Hyperthyroidism - Started yesterday on Methimazole - pt has kept this medicine down thus far  4. Likely CHTN - Blood pressures are ranging in 140s-150s/90s-100s - Discussed likely diagnosis of CHTN and plan to start anti-hypertensive. If unable to tolerate PO medication, will switch to IV until able to tolerate PO.  - Will try Labetalol 200 BID to start  5. Hypokalemia  - will replace through IV  6. Pregnancy - Plan is for termination per the pt. She has an appointment with Kendell Bane on 8/18 for the procedure.  Continue routine antenatal care.   Milas Hock, MD, FACOG Obstetrician & Gynecologist, [facility] for [facility], [facility]

## 2020-12-08 ENCOUNTER — Inpatient Hospital Stay (HOSPITAL_COMMUNITY): Payer: Medicaid Other

## 2020-12-08 DIAGNOSIS — O99280 Endocrine, nutritional and metabolic diseases complicating pregnancy, unspecified trimester: Secondary | ICD-10-CM | POA: Diagnosis not present

## 2020-12-08 DIAGNOSIS — R1110 Vomiting, unspecified: Secondary | ICD-10-CM

## 2020-12-08 DIAGNOSIS — O210 Mild hyperemesis gravidarum: Secondary | ICD-10-CM | POA: Diagnosis not present

## 2020-12-08 DIAGNOSIS — Z3A17 17 weeks gestation of pregnancy: Secondary | ICD-10-CM | POA: Diagnosis not present

## 2020-12-08 DIAGNOSIS — Z4682 Encounter for fitting and adjustment of non-vascular catheter: Secondary | ICD-10-CM | POA: Diagnosis not present

## 2020-12-08 LAB — COMPREHENSIVE METABOLIC PANEL
ALT: 252 U/L — ABNORMAL HIGH (ref 0–44)
AST: 217 U/L — ABNORMAL HIGH (ref 15–41)
Albumin: 2.1 g/dL — ABNORMAL LOW (ref 3.5–5.0)
Alkaline Phosphatase: 89 U/L (ref 38–126)
Anion gap: 7 (ref 5–15)
BUN: <5 mg/dL — ABNORMAL LOW (ref 6–20)
CO2: 23 mmol/L (ref 22–32)
Calcium: 8 mg/dL — ABNORMAL LOW (ref 8.9–10.3)
Chloride: 103 mmol/L (ref 98–111)
Creatinine, Ser: 0.38 mg/dL — ABNORMAL LOW (ref 0.44–1.00)
GFR, Estimated: >60 mL/min (ref >60)
Glucose, Bld: 96 mg/dL (ref 70–99)
Potassium: 3.4 mmol/L — ABNORMAL LOW (ref 3.5–5.1)
Sodium: 133 mmol/L — ABNORMAL LOW (ref 135–145)
Total Bilirubin: 1.4 mg/dL — ABNORMAL HIGH (ref 0.3–1.2)
Total Protein: 5 g/dL — ABNORMAL LOW (ref 6.5–8.1)

## 2020-12-08 LAB — GLUCOSE, CAPILLARY: Glucose-Capillary: 102 mg/dL — ABNORMAL HIGH (ref 70–99)

## 2020-12-08 LAB — MAGNESIUM: Magnesium: 1.5 mg/dL — ABNORMAL LOW (ref 1.7–2.4)

## 2020-12-08 MED ORDER — LABETALOL HCL 5 MG/ML IV SOLN
20.0000 mg | Freq: Two times a day (BID) | INTRAVENOUS | Status: DC
  Administered 2020-12-08 (×2): 20 mg via INTRAVENOUS
  Filled 2020-12-08 (×2): qty 4

## 2020-12-08 MED ORDER — OSMOLITE 1.2 CAL PO LIQD
1000.0000 mL | ORAL | Status: DC
Start: 2020-12-08 — End: 2020-12-11
  Administered 2020-12-08 – 2020-12-10 (×5): 1000 mL
  Filled 2020-12-08 (×4): qty 1000

## 2020-12-08 MED ORDER — LABETALOL HCL 200 MG PO TABS
200.0000 mg | ORAL_TABLET | Freq: Two times a day (BID) | ORAL | Status: DC
  Administered 2020-12-09: 200 mg via ORAL
  Filled 2020-12-08: qty 1

## 2020-12-08 NOTE — Progress Notes (Signed)
CBG 107 at 1600

## 2020-12-08 NOTE — Progress Notes (Signed)
Initial Nutrition Assessment  DOCUMENTATION CODES:  Severe malnutrition in context of acute illness/injury  INTERVENTION:  NPO for hyperemesis TF of Osmolite 1.2 to be initiated at 20 ml/hr, adv by 10 ml q 8 hours to a goal rate of 75 ml/hr ( 2160 Kcal, 100 g protein, 1476 ml free water) Monitor K, phos and Mg labs until at goal TF, plus 1 additional day, given high risk for refeeding syn Consider oral thiamine of 100 mg/day in addition to prenatal vits  NUTRITION DIAGNOSIS:   Severe Malnutrition related to vomiting as evidenced by percent weight loss, energy intake < or equal to 50% for > or equal to 5 days.  GOAL:  Patient will meet greater than or equal to 90% of their needs   MONITOR:  TF tolerance  REASON FOR ASSESSMENT:   New TF   ASSESSMENT:   Adm at 17 weeks with hyperemesis, multiple admissions this pregnancy. Usual weight 73 Kg, BMI 24.5. Pt with a 15% loss of usual weight. TF's initiated to replete current state of malnutrition   Diet Order:   Diet Order             Diet NPO time specified Except for: Sips with Meds, Ice Chips  Diet effective midnight                  EDUCATION NEEDS:   No education needs have been identified at this time  Skin:  Skin Assessment: Reviewed RN Assessment  Last BM:   none X 24 hours   Height:   Ht Readings from Last 1 Encounters:  12/02/20 5\' 8"  (1.727 m)    Weight:   Wt Readings from Last 1 Encounters:  12/03/20 61.7 kg    Ideal Body Weight:   140 lbs  BMI:  There is no height or weight on file to calculate BMI.  Estimated Nutritional Needs:   Kcal:  2000 - 2200  Protein:  90-100 g  Fluid:  2.3 L

## 2020-12-08 NOTE — Progress Notes (Signed)
FACULTY PRACTICE ANTEPARTUM COMPREHENSIVE PROGRESS NOTE  Deborah Evans is a 25 y.o. G3P1011 at [redacted]w[redacted]d who is admitted for HEG with elevated LFTs and metabolic acidosis with AG.  Estimated Date of Delivery: 05/17/21 by LMP and [place]   Length of Stay:  1 Days. Admitted 12/06/2020  Subjective: Not able to tolerate even liquids. More emesis episodes yesterday. Feels overall worse. She denies VB  Vitals:  Blood pressure (!) 157/107, pulse 98, temperature 98.1 F (36.7 C), resp. rate 19, last menstrual period 08/10/2020, SpO2 99 %, unknown if currently breastfeeding. Physical Examination: CONSTITUTIONAL: No acute distress.  NEUROLOGIC: Alert and oriented to person, place, and time. No cranial nerve deficit noted. PSYCHIATRIC: Normal mood and flat affect. Normal behavior. Normal judgment and thought content. CARDIOVASCULAR: Normal heart rate noted RESPIRATORY: Effort normal, no problems with respiration noted MUSCULOSKELETAL: Normal range of motion. No edema and no tenderness. 2+ distal pulses. ABDOMEN: Soft, nontender, nondistended, gravid. FH appropriate for gestational age   Labs: CMP Latest Ref Rng & Units 12/07/2020 12/06/2020 12/04/2020  Glucose 70 - 99 mg/dL 95 79 98  BUN 6 - 20 mg/dL 5(L) 9 5(L)  Creatinine 0.44 - 1.00 mg/dL 0.17 5.10 2.58  Sodium 135 - 145 mmol/L 134(L) 133(L) 132(L)  Potassium 3.5 - 5.1 mmol/L 3.0(L) 3.7 4.0  Chloride 98 - 111 mmol/L 104 96(L) 101  CO2 22 - 32 mmol/L 24 20(L) 21(L)  Calcium 8.9 - 10.3 mg/dL 7.8(L) 9.5 8.8(L)  Total Protein 6.5 - 8.1 g/dL 4.6(L) 7.3 6.1(L)  Total Bilirubin 0.3 - 1.2 mg/dL 2.1(H) 3.1(H) 1.7(H)  Alkaline Phos 38 - 126 U/L 84 122 102  AST 15 - 41 U/L 260(H) 305(H) 148(H)  ALT 0 - 44 U/L 247(H) 334(H) 245(H)     No results found.  Current scheduled medications  docusate sodium  100 mg Oral Daily   enoxaparin (LOVENOX) injection  40 mg Subcutaneous Q24H   labetalol  200 mg Oral BID   mouth rinse  15 mL Mouth Rinse q12n4p    methimazole  5 mg Oral BID   metoCLOPramide (REGLAN) injection  10 mg Intravenous Q6H   prenatal multivitamin  1 tablet Oral Q1200   scopolamine  1 patch Transdermal Q72H    I have reviewed the patient's current medications.  ASSESSMENT: Active Problems:   Hyperemesis affecting pregnancy, antepartum   [redacted] weeks gestation of pregnancy   Elevated LFTs   Metabolic acidosis   Hyperthyroidism in pregnancy, antepartum   Hyperemesis complicating pregnancy, antepartum   PLAN: Hyperemesis - Offered tube feeding given no response to multiple antiemetics and poor nutritional status. She is agreeable to this plan.  This will also help in making sure she gets her hyperthyroid and HTN medications as she has not been able to keep these down. -Continue anti-emetics: Scopolamine patch, scheduled Reglan, prn Zofran, prn phenergan.   2. Elevated LFTs - Improved from admission, but will continue to trend.  - s/p cholecystectomy on 7/28  3. Hyperthyroidism - Started yesterday on Methimazole - will convert to giving via tube feeds later  4.  CHTN - Blood pressures elevated, not able to tolerate meds. Some severe range.  - Will switch to IV Labetalol 20 mg bid for now until she gets tube feeds.  5. Hypokalemia  - will follow up labs and replete accordingly. Mg also checked.  6. Pregnancy - No VB or OB issues - Plan is for termination per the pt. She has an appointment with Kendell Bane on 8/18 for the  procedure.  Continue routine antenatal care.   Jaynie Collins, MD Obstetrician & Gynecologist, [facility] for [employer], [facility]

## 2020-12-09 DIAGNOSIS — E43 Unspecified severe protein-calorie malnutrition: Secondary | ICD-10-CM | POA: Insufficient documentation

## 2020-12-09 DIAGNOSIS — O99280 Endocrine, nutritional and metabolic diseases complicating pregnancy, unspecified trimester: Secondary | ICD-10-CM | POA: Diagnosis not present

## 2020-12-09 DIAGNOSIS — O210 Mild hyperemesis gravidarum: Secondary | ICD-10-CM | POA: Diagnosis not present

## 2020-12-09 DIAGNOSIS — R1110 Vomiting, unspecified: Secondary | ICD-10-CM | POA: Diagnosis not present

## 2020-12-09 DIAGNOSIS — Z3A17 17 weeks gestation of pregnancy: Secondary | ICD-10-CM | POA: Diagnosis not present

## 2020-12-09 LAB — COMPREHENSIVE METABOLIC PANEL
ALT: 180 U/L — ABNORMAL HIGH (ref 0–44)
AST: 100 U/L — ABNORMAL HIGH (ref 15–41)
Albumin: 1.9 g/dL — ABNORMAL LOW (ref 3.5–5.0)
Alkaline Phosphatase: 101 U/L (ref 38–126)
Anion gap: 6 (ref 5–15)
BUN: <5 mg/dL — ABNORMAL LOW (ref 6–20)
CO2: 24 mmol/L (ref 22–32)
Calcium: 8 mg/dL — ABNORMAL LOW (ref 8.9–10.3)
Chloride: 103 mmol/L (ref 98–111)
Creatinine, Ser: 0.42 mg/dL — ABNORMAL LOW (ref 0.44–1.00)
GFR, Estimated: >60 mL/min (ref >60)
Glucose, Bld: 91 mg/dL (ref 70–99)
Potassium: 3.6 mmol/L (ref 3.5–5.1)
Sodium: 133 mmol/L — ABNORMAL LOW (ref 135–145)
Total Bilirubin: 1.4 mg/dL — ABNORMAL HIGH (ref 0.3–1.2)
Total Protein: 5 g/dL — ABNORMAL LOW (ref 6.5–8.1)

## 2020-12-09 LAB — GLUCOSE, CAPILLARY
Glucose-Capillary: 101 mg/dL — ABNORMAL HIGH (ref 70–99)
Glucose-Capillary: 107 mg/dL — ABNORMAL HIGH (ref 70–99)
Glucose-Capillary: 101 mg/dL — ABNORMAL HIGH (ref 70–99)
Glucose-Capillary: 97 mg/dL (ref 70–99)
Glucose-Capillary: 104 mg/dL — ABNORMAL HIGH (ref 70–99)
Glucose-Capillary: 91 mg/dL (ref 70–99)

## 2020-12-09 LAB — MAGNESIUM: Magnesium: 1.4 mg/dL — ABNORMAL LOW (ref 1.7–2.4)

## 2020-12-09 MED ORDER — COMPLETENATE 29-1 MG PO CHEW
1.0000 | CHEWABLE_TABLET | Freq: Every day | ORAL | Status: DC
  Administered 2020-12-09 – 2020-12-10 (×2): 1 via ORAL
  Filled 2020-12-09 (×3): qty 1

## 2020-12-09 MED ORDER — LABETALOL HCL 200 MG PO TABS
300.0000 mg | ORAL_TABLET | Freq: Three times a day (TID) | ORAL | Status: DC
  Administered 2020-12-09 – 2020-12-10 (×5): 300 mg via NASOGASTRIC
  Filled 2020-12-09 (×5): qty 1

## 2020-12-09 MED ORDER — LABETALOL HCL 200 MG PO TABS: 400.0000 mg | ORAL_TABLET | Freq: Two times a day (BID) | ORAL | Status: DC

## 2020-12-09 MED ORDER — LABETALOL HCL 200 MG PO TABS: 300.0000 mg | ORAL_TABLET | Freq: Three times a day (TID) | ORAL | Status: DC

## 2020-12-09 MED ORDER — DOCUSATE SODIUM 50 MG/5ML PO LIQD
100.0000 mg | Freq: Every day | ORAL | Status: DC
  Administered 2020-12-09 – 2020-12-10 (×2): 100 mg via ORAL
  Filled 2020-12-09 (×4): qty 10

## 2020-12-09 NOTE — Progress Notes (Signed)
FACULTY PRACTICE ANTEPARTUM COMPREHENSIVE PROGRESS NOTE  Deborah Evans is a 25 y.o. G3P1011 at [redacted]w[redacted]d who is admitted for HEG with elevated LFTs and metabolic acidosis with AG.  Estimated Date of Delivery: 05/17/21 by LMP and [place]   Length of Stay:  2 Days. Admitted 12/06/2020  Subjective: Had feeding tube placed yesterday, patient feels slightly better. She denies VB  Vitals:  Blood pressure (!) 153/114, pulse (!) 126, temperature 97.7 F (36.5 C), temperature source Oral, resp. rate 18, weight 58.5 kg, last menstrual period 08/10/2020, SpO2 98 %, unknown if currently breastfeeding. Patient Vitals for the past 24 hrs:  BP Temp Temp src Pulse Resp SpO2 Weight  12/09/20 1127 (!) 153/114 -- -- (!) 126 -- -- --  12/09/20 1113 (!) 159/108 97.7 F (36.5 C) Oral (!) 109 18 98 % --  12/09/20 1029 -- -- -- -- -- -- 58.5 kg  12/09/20 0809 (!) 145/100 97.9 F (36.6 C) Oral (!) 109 17 97 % --  12/09/20 0652 (!) 148/93 98 F (36.7 C) -- (!) 110 -- -- --  12/08/20 2330 (!) 143/99 98 F (36.7 C) Oral (!) 119 18 -- --  12/08/20 2045 -- -- -- -- -- 98 % --  12/08/20 2044 (!) 150/109 98.7 F (37.1 C) Oral (!) 116 18 -- --  12/08/20 1518 (!) 147/99 -- -- (!) 116 -- -- --  12/08/20 1429 (!) 165/109 -- -- (!) 115 16 98 % --    Physical Examination: CONSTITUTIONAL: No acute distress.  NEUROLOGIC: Alert and oriented to person, place, and time. No cranial nerve deficit noted. PSYCHIATRIC: Normal mood and flat affect. Normal behavior. Normal judgment and thought content. CARDIOVASCULAR: Normal heart rate noted RESPIRATORY: Effort normal, no problems with respiration noted MUSCULOSKELETAL: Normal range of motion. No edema and no tenderness. 2+ distal pulses. ABDOMEN: Soft, nontender, nondistended, gravid. FH appropriate for gestational age   Labs: CMP Latest Ref Rng & Units 12/08/2020 12/07/2020 12/06/2020  Glucose 70 - 99 mg/dL 96 95 79  BUN 6 - 20 mg/dL <8(A) 5(L) 9  Creatinine 0.44 - 1.00 mg/dL 1.65(V)  3.74 8.27  Sodium 135 - 145 mmol/L 133(L) 134(L) 133(L)  Potassium 3.5 - 5.1 mmol/L 3.4(L) 3.0(L) 3.7  Chloride 98 - 111 mmol/L 103 104 96(L)  CO2 22 - 32 mmol/L 23 24 20(L)  Calcium 8.9 - 10.3 mg/dL 8.0(L) 7.8(L) 9.5  Total Protein 6.5 - 8.1 g/dL 5.0(L) 4.6(L) 7.3  Total Bilirubin 0.3 - 1.2 mg/dL 0.7(E) 2.1(H) 3.1(H)  Alkaline Phos 38 - 126 U/L 89 84 122  AST 15 - 41 U/L 217(H) 260(H) 305(H)  ALT 0 - 44 U/L 252(H) 247(H) 334(H)     DG Abd Portable 1V  Result Date: 12/08/2020 CLINICAL DATA:  Nasogastric tube placement EXAM: PORTABLE ABDOMEN - 1 VIEW COMPARISON:  Portable exam 1248 hours compared to 01/14/2018 FINDINGS: Tip of feeding tube projects over duodenal bulb. Surgical clips RIGHT upper quadrant likely reflect prior cholecystectomy. Paucity of bowel gas. Osseous structures unremarkable. IMPRESSION: Tip of feeding tube projects over duodenal bulb. Electronically Signed   By: Ulyses Southward M.D.   On: 12/08/2020 13:13    Current scheduled medications  docusate  100 mg Oral Daily   enoxaparin (LOVENOX) injection  40 mg Subcutaneous Q24H   labetalol  200 mg Oral BID   mouth rinse  15 mL Mouth Rinse q12n4p   methimazole  5 mg Oral BID   metoCLOPramide (REGLAN) injection  10 mg Intravenous Q6H   prenatal  vitamin w/FE, FA  1 tablet Oral Q1200   scopolamine  1 patch Transdermal Q72H    I have reviewed the patient's current medications.  ASSESSMENT: Active Problems:   Hyperemesis affecting pregnancy, antepartum   [redacted] weeks gestation of pregnancy   Elevated LFTs   Metabolic acidosis   Hyperthyroidism in pregnancy, antepartum   Hyperemesis complicating pregnancy, antepartum   Protein-calorie malnutrition, severe   PLAN: Hyperemesis - Continue tube feeding, appreciate Dietician's input. -Continue anti-emetics: Scopolamine patch, scheduled Reglan, prn Zofran, prn phenergan.   2. Elevated LFTs - Improved from admission, but will continue to trend.  - s/p cholecystectomy on  7/28  3. Hyperthyroidism - Continue Methimazole  4.  CHTN - Blood pressures elevated, continue Labetalol and titrate as needed. - May need IV meds for breakthrough severe range BPs  5. Hypokalemia  - will follow up labs and replete accordingly. Mg also checked.  6. Pregnancy - No VB or OB issues - Plan is for termination per the pt. She has an appointment with Kendell Bane on 8/18 for the procedure.  Continue routine antenatal care.   Jaynie Collins, MD Obstetrician & Gynecologist, [facility] for [employer], [facility]

## 2020-12-10 DIAGNOSIS — R1110 Vomiting, unspecified: Secondary | ICD-10-CM | POA: Diagnosis not present

## 2020-12-10 DIAGNOSIS — O99280 Endocrine, nutritional and metabolic diseases complicating pregnancy, unspecified trimester: Secondary | ICD-10-CM | POA: Diagnosis not present

## 2020-12-10 DIAGNOSIS — O210 Mild hyperemesis gravidarum: Secondary | ICD-10-CM | POA: Diagnosis not present

## 2020-12-10 DIAGNOSIS — Z3A17 17 weeks gestation of pregnancy: Secondary | ICD-10-CM | POA: Diagnosis not present

## 2020-12-10 LAB — CBC
HCT: 28.4 % — ABNORMAL LOW (ref 36.0–46.0)
Hemoglobin: 9.4 g/dL — ABNORMAL LOW (ref 12.0–15.0)
MCH: 26.9 pg (ref 26.0–34.0)
MCHC: 33.1 g/dL (ref 30.0–36.0)
MCV: 81.4 fL (ref 80.0–100.0)
Platelets: 92 10*3/uL — ABNORMAL LOW (ref 150–400)
RBC: 3.49 MIL/uL — ABNORMAL LOW (ref 3.87–5.11)
RDW: 15 % (ref 11.5–15.5)
WBC: 7.2 10*3/uL (ref 4.0–10.5)
nRBC: 0.7 % — ABNORMAL HIGH (ref 0.0–0.2)

## 2020-12-10 LAB — COMPREHENSIVE METABOLIC PANEL WITH GFR
ALT: 130 U/L — ABNORMAL HIGH (ref 0–44)
AST: 58 U/L — ABNORMAL HIGH (ref 15–41)
Albumin: 1.8 g/dL — ABNORMAL LOW (ref 3.5–5.0)
Alkaline Phosphatase: 120 U/L (ref 38–126)
Anion gap: 8 (ref 5–15)
BUN: <5 mg/dL — ABNORMAL LOW (ref 6–20)
CO2: 23 mmol/L (ref 22–32)
Calcium: 8.3 mg/dL — ABNORMAL LOW (ref 8.9–10.3)
Chloride: 101 mmol/L (ref 98–111)
Creatinine, Ser: 0.37 mg/dL — ABNORMAL LOW (ref 0.44–1.00)
GFR, Estimated: >60 mL/min
Glucose, Bld: 92 mg/dL (ref 70–99)
Potassium: 3.8 mmol/L (ref 3.5–5.1)
Sodium: 132 mmol/L — ABNORMAL LOW (ref 135–145)
Total Bilirubin: 1.1 mg/dL (ref 0.3–1.2)
Total Protein: 4.6 g/dL — ABNORMAL LOW (ref 6.5–8.1)

## 2020-12-10 LAB — MAGNESIUM: Magnesium: 1.4 mg/dL — ABNORMAL LOW (ref 1.7–2.4)

## 2020-12-10 LAB — GLUCOSE, CAPILLARY
Glucose-Capillary: 103 mg/dL — ABNORMAL HIGH (ref 70–99)
Glucose-Capillary: 109 mg/dL — ABNORMAL HIGH (ref 70–99)
Glucose-Capillary: 112 mg/dL — ABNORMAL HIGH (ref 70–99)
Glucose-Capillary: 91 mg/dL (ref 70–99)
Glucose-Capillary: 97 mg/dL (ref 70–99)
Glucose-Capillary: 98 mg/dL (ref 70–99)
Glucose-Capillary: 99 mg/dL (ref 70–99)

## 2020-12-10 MED ORDER — MISOPROSTOL 200 MCG PO TABS
400.0000 ug | ORAL_TABLET | Freq: Once | ORAL | Status: AC
  Administered 2020-12-11: 400 ug via BUCCAL
  Filled 2020-12-10: qty 2

## 2020-12-10 MED ORDER — MISOPROSTOL 200 MCG PO TABS
600.0000 ug | ORAL_TABLET | Freq: Once | ORAL | Status: AC
  Administered 2020-12-11: 600 ug via BUCCAL
  Filled 2020-12-10: qty 3

## 2020-12-10 MED ORDER — MISOPROSTOL 200 MCG PO TABS: 200.0000 ug | ORAL_TABLET | ORAL | Status: DC

## 2020-12-10 MED ORDER — MISOPROSTOL 200 MCG PO TABS: 800.0000 ug | ORAL_TABLET | ORAL | Status: DC

## 2020-12-10 MED ORDER — MISOPROSTOL 200 MCG PO TABS
200.0000 ug | ORAL_TABLET | Freq: Once | ORAL | Status: AC
  Administered 2020-12-10: 200 ug via BUCCAL
  Filled 2020-12-10: qty 1

## 2020-12-10 NOTE — Progress Notes (Signed)
Patient ID: Deborah Evans, female   DOB: 1996-01-02, 25 y.o.   MRN: [id number] Reviewed chart in full with Drs Shawnie Pons and Para March Our conclusion is this patient has severe pre eclampsia with de novo hypertension still uncontrolled on escalating anti hypertensives, new onset thrombocytopenia and continued elevated LFTs. In the setting of newly diagnosed hyperthyroidism(Thyroid labs from earlier in the pregnancy were normal), this constellation of findings puts the patient's health in significant and eminent danger.  I have discussed these findings with the patient, after a consensus discussion with Drs Shawnie Pons and Para March.  As a result we have offered and encouraged this patient to strongly consider terminating the pregnancy because her life is indeed in danger and the pregnancy is remote from viability.  The patient's questions were answered and she has decided to proceed with cytotec induction.  Will begin at 200 mcg and increase step wise to 800 mcg.  She understands this process may possibly take a couple of days.  All questions were answered  I have discussed this with AC, Ocie Doyne, RN and the nursing staff at large The 72 hour waiting period is waived due to life threatening nature of this clinical situation.  Lazaro Arms, MD 12/10/2020 6:33 PM

## 2020-12-10 NOTE — Progress Notes (Signed)
Initial Nutrition Assessment  DOCUMENTATION CODES:  Severe malnutrition in context of acute illness/injury  INTERVENTION:  NPO/ ice chips   - pt reports feeling less nausea this morning TF of Osmolite 1.2 has reached 70 ml/hr, goal rate of 75 ml/hr ( 2160 Kcal, 100 g protein, 1476 ml free water) Monitor K, phos and Mg labs until at goal TF, plus 1 additional day, given high risk for refeeding syn Consider oral thiamine of 100 mg/day in addition to prenatal vits  NUTRITION DIAGNOSIS:   Severe Malnutrition related to vomiting as evidenced by percent weight loss, energy intake < or equal to 50% for > or equal to 5 days.  GOAL:  Patient will meet greater than or equal to 90% of their needs   MONITOR:  TF tolerance  REASON FOR ASSESSMENT:   New TF   ASSESSMENT:   Adm at 17 weeks with hyperemesis, multiple admissions this pregnancy. Usual weight 73 Kg, BMI 24.5. Pt with a 15% loss of usual weight. TF's initiated to replete current state of malnutrition  8/12, weight up 1.5 kg  Diet Order:   Diet Order             Diet NPO time specified Except for: Sips with Meds, Ice Chips  Diet effective midnight                  EDUCATION NEEDS:   No education needs have been identified at this time  Skin:  Skin Assessment: Reviewed RN Assessment  Last BM:   none recorded since adm   Height:   Ht Readings from Last 1 Encounters:  12/02/20 5\' 8"  (1.727 m)    Weight:   Wt Readings from Last 1 Encounters:  12/10/20 60 kg    Ideal Body Weight:   140 lbs  BMI:  Body mass index is 20.11 kg/m.  Estimated Nutritional Needs:   Kcal:  2000 - 2200  Protein:  90-100 g  Fluid:  2.3 L

## 2020-12-10 NOTE — Progress Notes (Signed)
FACULTY PRACTICE ANTEPARTUM COMPREHENSIVE PROGRESS NOTE  Deborah Evans is a 25 y.o. G3P1011 at [redacted]w[redacted]d who is admitted for HEG with elevated LFTs and metabolic acidosis with AG.  Estimated Date of Delivery: 05/17/21 by LMP and [place]   Length of Stay:  3 Days. Admitted 12/06/2020  Subjective: Had feeding tube placed on 8/10, getting tube feeds but still having emesis of clear liquids around tube. She denies VB or abdominal pain.   Vitals:  Blood pressure (!) 145/99, pulse (!) 116, temperature 98.4 F (36.9 C), temperature source Oral, resp. rate 18, weight 60 kg, last menstrual period 08/10/2020, SpO2 100 %, unknown if currently breastfeeding. Patient Vitals for the past 24 hrs:  BP Temp Temp src Pulse Resp SpO2 Weight  12/10/20 0739 (!) 145/99 98.4 F (36.9 C) Oral (!) 116 18 100 % --  12/10/20 0600 (!) 138/96 98.1 F (36.7 C) Oral (!) 116 18 99 % 60 kg  12/10/20 0024 (!) 132/91 98.3 F (36.8 C) Oral (!) 128 17 98 % --  12/09/20 2046 137/90 98.4 F (36.9 C) Oral (!) 125 18 -- --  12/09/20 1536 (!) 152/107 98.3 F (36.8 C) Oral (!) 116 18 99 % --  12/09/20 1127 (!) 153/114 -- -- (!) 126 -- -- --  12/09/20 1113 (!) 159/108 97.7 F (36.5 C) Oral (!) 109 18 98 % --  12/09/20 1029 -- -- -- -- -- -- 58.5 kg    Physical Examination: CONSTITUTIONAL: No acute distress.  NEUROLOGIC: Alert and oriented to person, place, and time. No cranial nerve deficit noted. PSYCHIATRIC: Normal mood and flat affect. Normal behavior. Normal judgment and thought content. CARDIOVASCULAR: Normal heart rate noted RESPIRATORY: Effort normal, no problems with respiration noted MUSCULOSKELETAL: Normal range of motion. No edema and no tenderness. 2+ distal pulses. ABDOMEN: Soft, nontender, nondistended, gravid. FH appropriate for gestational age   Labs: CMP Latest Ref Rng & Units 12/10/2020 12/09/2020 12/08/2020  Glucose 70 - 99 mg/dL 92 91 96  BUN 6 - 20 mg/dL <2(P) <5(T) <6(R)  Creatinine 0.44 - 1.00 mg/dL  4.43(X) 5.40(G) 8.67(Y)  Sodium 135 - 145 mmol/L 132(L) 133(L) 133(L)  Potassium 3.5 - 5.1 mmol/L 3.8 3.6 3.4(L)  Chloride 98 - 111 mmol/L 101 103 103  CO2 22 - 32 mmol/L 23 24 23   Calcium 8.9 - 10.3 mg/dL 8.3(L) 8.0(L) 8.0(L)  Total Protein 6.5 - 8.1 g/dL 4.6(L) 5.0(L) 5.0(L)  Total Bilirubin 0.3 - 1.2 mg/dL 1.1 ) 1.9(J)  Alkaline Phos 38 - 126 U/L 120 101 89  AST 15 - 41 U/L 58(H) 100(H) 217(H)  ALT 0 - 44 U/L 130(H) 180(H) 252(H)   CBC Latest Ref Rng & Units 12/10/2020 12/02/2020 11/27/2020  WBC 4.0 - 10.5 K/uL 7.2 10.8(H) 7.0  Hemoglobin 12.0 - 15.0 g/dL 11/29/2020) 11.4(L) 10.0(L)  Hematocrit 36.0 - 46.0 % 28.4(L) 34.5(L) 30.0(L)  Platelets 150 - 400 K/uL 92(L) 190 127(L)      DG Abd Portable 1V  Result Date: 12/08/2020 CLINICAL DATA:  Nasogastric tube placement EXAM: PORTABLE ABDOMEN - 1 VIEW COMPARISON:  Portable exam 1248 hours compared to 01/14/2018 FINDINGS: Tip of feeding tube projects over duodenal bulb. Surgical clips RIGHT upper quadrant likely reflect prior cholecystectomy. Paucity of bowel gas. Osseous structures unremarkable. IMPRESSION: Tip of feeding tube projects over duodenal bulb. Electronically Signed   By: 01/16/2018 M.D.   On: 12/08/2020 13:13    Current scheduled medications  docusate  100 mg Oral Daily   enoxaparin (LOVENOX) injection  40 mg Subcutaneous Q24H   labetalol  300 mg Per NG tube TID   mouth rinse  15 mL Mouth Rinse q12n4p   methimazole  5 mg Oral BID   metoCLOPramide (REGLAN) injection  10 mg Intravenous Q6H   prenatal vitamin w/FE, FA  1 tablet Oral Q1200   scopolamine  1 patch Transdermal Q72H    I have reviewed the patient's current medications.  ASSESSMENT: Active Problems:   Hyperemesis affecting pregnancy, antepartum   [redacted] weeks gestation of pregnancy   Elevated LFTs   Metabolic acidosis   Hyperthyroidism in pregnancy, antepartum   Hyperemesis complicating pregnancy, antepartum   Protein-calorie malnutrition,  severe   PLAN: Hyperemesis - Continue tube feeding, appreciate Dietician's input. - Continue anti-emetics: Scopolamine patch, scheduled Reglan, prn Zofran, prn phenergan.   2. Elevated LFTs - Improved from admission, but will continue to trend.  - s/p cholecystectomy on 7/28  3. Hyperthyroidism - Continue Methimazole 5 mg po bid  4.  CHTN - Blood pressures elevated, continue Labetalol 300 mg tid and titrate as needed. - May need IV meds for breakthrough severe range BPs  5. Hypokalemia  - will follow up labs and replete accordingly. Mg also checked.  6. Pregnancy - No VB or OB issues - Plan is for termination per the pt. She has an appointment with Kendell Bane on 8/18 for the procedure.  Continue routine antenatal care.   Jaynie Collins, MD Obstetrician & Gynecologist, [facility] for [employer], [facility]

## 2020-12-11 ENCOUNTER — Inpatient Hospital Stay (HOSPITAL_COMMUNITY): Payer: Medicaid Other

## 2020-12-11 ENCOUNTER — Inpatient Hospital Stay (HOSPITAL_COMMUNITY): Payer: Medicaid Other | Admitting: Anesthesiology

## 2020-12-11 DIAGNOSIS — R112 Nausea with vomiting, unspecified: Secondary | ICD-10-CM | POA: Diagnosis not present

## 2020-12-11 DIAGNOSIS — O164 Unspecified maternal hypertension, complicating childbirth: Secondary | ICD-10-CM | POA: Diagnosis not present

## 2020-12-11 DIAGNOSIS — O43891 Other placental disorders, first trimester: Secondary | ICD-10-CM | POA: Diagnosis not present

## 2020-12-11 DIAGNOSIS — Z3A16 16 weeks gestation of pregnancy: Secondary | ICD-10-CM | POA: Diagnosis not present

## 2020-12-11 DIAGNOSIS — O364XX0 Maternal care for intrauterine death, not applicable or unspecified: Secondary | ICD-10-CM | POA: Diagnosis not present

## 2020-12-11 DIAGNOSIS — Z3490 Encounter for supervision of normal pregnancy, unspecified, unspecified trimester: Secondary | ICD-10-CM

## 2020-12-11 DIAGNOSIS — Z3A17 17 weeks gestation of pregnancy: Secondary | ICD-10-CM | POA: Diagnosis not present

## 2020-12-11 LAB — CBC
HCT: 30.7 % — ABNORMAL LOW (ref 36.0–46.0)
HCT: 32.4 % — ABNORMAL LOW (ref 36.0–46.0)
HCT: 30.8 % — ABNORMAL LOW (ref 36.0–46.0)
Hemoglobin: 9.7 g/dL — ABNORMAL LOW (ref 12.0–15.0)
Hemoglobin: 10.5 g/dL — ABNORMAL LOW (ref 12.0–15.0)
Hemoglobin: 10 g/dL — ABNORMAL LOW (ref 12.0–15.0)
MCH: 26.5 pg (ref 26.0–34.0)
MCH: 27.3 pg (ref 26.0–34.0)
MCH: 26.7 pg (ref 26.0–34.0)
MCHC: 31.6 g/dL (ref 30.0–36.0)
MCHC: 32.4 g/dL (ref 30.0–36.0)
MCHC: 32.5 g/dL (ref 30.0–36.0)
MCV: 83.9 fL (ref 80.0–100.0)
MCV: 84.2 fL (ref 80.0–100.0)
MCV: 82.4 fL (ref 80.0–100.0)
Platelets: 103 10*3/uL — ABNORMAL LOW (ref 150–400)
Platelets: 106 10*3/uL — ABNORMAL LOW (ref 150–400)
Platelets: 107 10*3/uL — ABNORMAL LOW (ref 150–400)
RBC: 3.66 MIL/uL — ABNORMAL LOW (ref 3.87–5.11)
RBC: 3.85 MIL/uL — ABNORMAL LOW (ref 3.87–5.11)
RBC: 3.74 MIL/uL — ABNORMAL LOW (ref 3.87–5.11)
RDW: 14.9 % (ref 11.5–15.5)
RDW: 15.1 % (ref 11.5–15.5)
RDW: 15 % (ref 11.5–15.5)
WBC: 10 10*3/uL (ref 4.0–10.5)
WBC: 9.6 10*3/uL (ref 4.0–10.5)
WBC: 12.1 10*3/uL — ABNORMAL HIGH (ref 4.0–10.5)
nRBC: 0.4 % — ABNORMAL HIGH (ref 0.0–0.2)
nRBC: 0.9 % — ABNORMAL HIGH (ref 0.0–0.2)
nRBC: 0.6 % — ABNORMAL HIGH (ref 0.0–0.2)

## 2020-12-11 LAB — COMPREHENSIVE METABOLIC PANEL
ALT: 103 U/L — ABNORMAL HIGH (ref 0–44)
ALT: 88 U/L — ABNORMAL HIGH (ref 0–44)
AST: 41 U/L (ref 15–41)
AST: 35 U/L (ref 15–41)
Albumin: 2.1 g/dL — ABNORMAL LOW (ref 3.5–5.0)
Albumin: 1.9 g/dL — ABNORMAL LOW (ref 3.5–5.0)
Alkaline Phosphatase: 122 U/L (ref 38–126)
Alkaline Phosphatase: 129 U/L — ABNORMAL HIGH (ref 38–126)
Anion gap: 6 (ref 5–15)
Anion gap: 4 — ABNORMAL LOW (ref 5–15)
BUN: 6 mg/dL (ref 6–20)
BUN: 6 mg/dL (ref 6–20)
CO2: 24 mmol/L (ref 22–32)
CO2: 22 mmol/L (ref 22–32)
Calcium: 8 mg/dL — ABNORMAL LOW (ref 8.9–10.3)
Calcium: 7.8 mg/dL — ABNORMAL LOW (ref 8.9–10.3)
Chloride: 101 mmol/L (ref 98–111)
Chloride: 103 mmol/L (ref 98–111)
Creatinine, Ser: 0.42 mg/dL — ABNORMAL LOW (ref 0.44–1.00)
Creatinine, Ser: 0.35 mg/dL — ABNORMAL LOW (ref 0.44–1.00)
GFR, Estimated: >60 mL/min (ref >60)
GFR, Estimated: >60 mL/min (ref >60)
Glucose, Bld: 86 mg/dL (ref 70–99)
Glucose, Bld: 94 mg/dL (ref 70–99)
Potassium: 4.1 mmol/L (ref 3.5–5.1)
Potassium: 4.3 mmol/L (ref 3.5–5.1)
Sodium: 131 mmol/L — ABNORMAL LOW (ref 135–145)
Sodium: 129 mmol/L — ABNORMAL LOW (ref 135–145)
Total Bilirubin: 1 mg/dL (ref 0.3–1.2)
Total Bilirubin: 0.9 mg/dL (ref 0.3–1.2)
Total Protein: 5.1 g/dL — ABNORMAL LOW (ref 6.5–8.1)
Total Protein: 4.5 g/dL — ABNORMAL LOW (ref 6.5–8.1)

## 2020-12-11 LAB — GLUCOSE, CAPILLARY
Glucose-Capillary: 102 mg/dL — ABNORMAL HIGH (ref 70–99)
Glucose-Capillary: 102 mg/dL — ABNORMAL HIGH (ref 70–99)
Glucose-Capillary: 90 mg/dL (ref 70–99)
Glucose-Capillary: 94 mg/dL (ref 70–99)
Glucose-Capillary: 110 mg/dL — ABNORMAL HIGH (ref 70–99)

## 2020-12-11 LAB — MAGNESIUM: Magnesium: 1.5 mg/dL — ABNORMAL LOW (ref 1.7–2.4)

## 2020-12-11 LAB — HCG, QUANTITATIVE, PREGNANCY: hCG, Beta Chain, Quant, S: 499050 m[IU]/mL — ABNORMAL HIGH (ref <5)

## 2020-12-11 MED ORDER — DIPHENHYDRAMINE HCL 50 MG/ML IJ SOLN
12.5000 mg | INTRAMUSCULAR | Status: DC | PRN
Start: 2020-12-11 — End: 2020-12-11

## 2020-12-11 MED ORDER — KCL-LACTATED RINGERS 20 MEQ/L IV SOLN
INTRAVENOUS | Status: DC
  Filled 2020-12-11: qty 1000

## 2020-12-11 MED ORDER — COCONUT OIL OIL: 1.0000 "application " | TOPICAL_OIL | Status: DC | PRN

## 2020-12-11 MED ORDER — ACETAMINOPHEN 325 MG PO TABS: 650.0000 mg | ORAL_TABLET | ORAL | Status: DC | PRN

## 2020-12-11 MED ORDER — PHENYLEPHRINE 40 MCG/ML (10ML) SYRINGE FOR IV PUSH (FOR BLOOD PRESSURE SUPPORT)
80.0000 ug | PREFILLED_SYRINGE | INTRAVENOUS | Status: DC | PRN
  Filled 2020-12-11: qty 10

## 2020-12-11 MED ORDER — METHYLERGONOVINE MALEATE 0.2 MG/ML IJ SOLN
INTRAMUSCULAR | Status: AC
  Administered 2020-12-11: 0.2 mg
  Filled 2020-12-11: qty 1

## 2020-12-11 MED ORDER — IBUPROFEN 600 MG PO TABS: 600.0000 mg | ORAL_TABLET | Freq: Four times a day (QID) | ORAL | Status: DC

## 2020-12-11 MED ORDER — MEASLES, MUMPS & RUBELLA VAC IJ SOLR: 0.5000 mL | Freq: Once | INTRAMUSCULAR | Status: DC

## 2020-12-11 MED ORDER — SIMETHICONE 80 MG PO CHEW: 80.0000 mg | CHEWABLE_TABLET | ORAL | Status: DC | PRN

## 2020-12-11 MED ORDER — ONDANSETRON HCL 4 MG PO TABS: 4.0000 mg | ORAL_TABLET | ORAL | Status: DC | PRN

## 2020-12-11 MED ORDER — METHIMAZOLE 5 MG PO TABS
5.0000 mg | ORAL_TABLET | Freq: Two times a day (BID) | ORAL | Status: DC
  Administered 2020-12-11 (×2): 5 mg via ORAL
  Filled 2020-12-11 (×3): qty 1

## 2020-12-11 MED ORDER — DIPHENHYDRAMINE HCL 25 MG PO CAPS: 25.0000 mg | ORAL_CAPSULE | Freq: Four times a day (QID) | ORAL | Status: DC | PRN

## 2020-12-11 MED ORDER — LIDOCAINE HCL (PF) 1 % IJ SOLN
INTRAMUSCULAR | Status: DC | PRN
  Administered 2020-12-11: 5 mL via EPIDURAL

## 2020-12-11 MED ORDER — HYDRALAZINE HCL 20 MG/ML IJ SOLN
10.0000 mg | INTRAMUSCULAR | Status: DC | PRN
  Administered 2020-12-11: 10 mg via INTRAVENOUS
  Filled 2020-12-11: qty 1

## 2020-12-11 MED ORDER — LACTATED RINGERS IV SOLN: 500.0000 mL | Freq: Once | INTRAVENOUS | Status: DC

## 2020-12-11 MED ORDER — OSMOLITE 1.2 CAL PO LIQD
1000.0000 mL | ORAL | Status: DC
  Administered 2020-12-11: 1000 mL
  Filled 2020-12-11 (×2): qty 1000

## 2020-12-11 MED ORDER — WITCH HAZEL-GLYCERIN EX PADS: 1.0000 "application " | MEDICATED_PAD | CUTANEOUS | Status: DC | PRN

## 2020-12-11 MED ORDER — LABETALOL HCL 5 MG/ML IV SOLN
40.0000 mg | INTRAVENOUS | Status: DC | PRN
  Administered 2020-12-11: 40 mg via INTRAVENOUS
  Filled 2020-12-11 (×3): qty 8

## 2020-12-11 MED ORDER — LABETALOL HCL 5 MG/ML IV SOLN
80.0000 mg | INTRAVENOUS | Status: DC | PRN
  Administered 2020-12-11: 80 mg via INTRAVENOUS

## 2020-12-11 MED ORDER — OXYTOCIN-SODIUM CHLORIDE 30-0.9 UT/500ML-% IV SOLN
INTRAVENOUS | Status: AC
  Administered 2020-12-11: 333 mL via INTRAVENOUS
  Filled 2020-12-11: qty 500

## 2020-12-11 MED ORDER — DIBUCAINE (PERIANAL) 1 % EX OINT: 1.0000 "application " | TOPICAL_OINTMENT | CUTANEOUS | Status: DC | PRN

## 2020-12-11 MED ORDER — SENNOSIDES-DOCUSATE SODIUM 8.6-50 MG PO TABS: 2.0000 | ORAL_TABLET | Freq: Every day | ORAL | Status: DC

## 2020-12-11 MED ORDER — PRENATAL MULTIVITAMIN CH: 1.0000 | ORAL_TABLET | Freq: Every day | ORAL | Status: DC

## 2020-12-11 MED ORDER — OXYTOCIN BOLUS FROM INFUSION: 333.0000 mL | Freq: Once | INTRAVENOUS | Status: AC

## 2020-12-11 MED ORDER — IBUPROFEN 100 MG/5ML PO SUSP
600.0000 mg | Freq: Four times a day (QID) | ORAL | Status: DC
  Administered 2020-12-11: 600 mg
  Filled 2020-12-11 (×2): qty 30

## 2020-12-11 MED ORDER — FENTANYL-BUPIVACAINE-NACL 0.5-0.125-0.9 MG/250ML-% EP SOLN
EPIDURAL | Status: AC
  Filled 2020-12-11: qty 250

## 2020-12-11 MED ORDER — LABETALOL HCL 5 MG/ML IV SOLN
20.0000 mg | INTRAVENOUS | Status: DC | PRN
  Administered 2020-12-11: 20 mg via INTRAVENOUS
  Filled 2020-12-11: qty 4

## 2020-12-11 MED ORDER — BENZOCAINE-MENTHOL 20-0.5 % EX AERO: 1.0000 "application " | INHALATION_SPRAY | CUTANEOUS | Status: DC | PRN

## 2020-12-11 MED ORDER — OXYTOCIN-SODIUM CHLORIDE 30-0.9 UT/500ML-% IV SOLN
2.5000 [IU]/h | INTRAVENOUS | Status: DC
  Administered 2020-12-11: 2.5 [IU]/h via INTRAVENOUS

## 2020-12-11 MED ORDER — SODIUM CHLORIDE 0.9 % IV SOLN
8.0000 mg | Freq: Once | INTRAVENOUS | Status: DC
  Filled 2020-12-11: qty 4

## 2020-12-11 MED ORDER — TETANUS-DIPHTH-ACELL PERTUSSIS 5-2.5-18.5 LF-MCG/0.5 IM SUSY: 0.5000 mL | PREFILLED_SYRINGE | Freq: Once | INTRAMUSCULAR | Status: DC

## 2020-12-11 MED ORDER — MEDROXYPROGESTERONE ACETATE 150 MG/ML IM SUSP
150.0000 mg | INTRAMUSCULAR | Status: AC | PRN
  Administered 2020-12-12: 150 mg via INTRAMUSCULAR
  Filled 2020-12-11: qty 1

## 2020-12-11 MED ORDER — COMPLETENATE 29-1 MG PO CHEW
1.0000 | CHEWABLE_TABLET | Freq: Every day | ORAL | Status: DC
  Administered 2020-12-11: 1

## 2020-12-11 MED ORDER — LACTATED RINGERS IV SOLN: INTRAVENOUS | Status: DC

## 2020-12-11 MED ORDER — FENTANYL CITRATE (PF) 100 MCG/2ML IJ SOLN
50.0000 ug | INTRAMUSCULAR | Status: DC | PRN
  Administered 2020-12-11 (×2): 100 ug via INTRAVENOUS
  Filled 2020-12-11 (×2): qty 2

## 2020-12-11 MED ORDER — AMLODIPINE BESYLATE 10 MG PO TABS
10.0000 mg | ORAL_TABLET | Freq: Every day | ORAL | Status: DC
  Administered 2020-12-11: 10 mg via NASOGASTRIC
  Filled 2020-12-11: qty 1

## 2020-12-11 MED ORDER — FENTANYL-BUPIVACAINE-NACL 0.5-0.125-0.9 MG/250ML-% EP SOLN
EPIDURAL | Status: DC | PRN
  Administered 2020-12-11: 12 mL/h via EPIDURAL

## 2020-12-11 MED ORDER — PHENYLEPHRINE 40 MCG/ML (10ML) SYRINGE FOR IV PUSH (FOR BLOOD PRESSURE SUPPORT): 80.0000 ug | PREFILLED_SYRINGE | INTRAVENOUS | Status: DC | PRN

## 2020-12-11 MED ORDER — EPHEDRINE 5 MG/ML INJ: 10.0000 mg | INTRAVENOUS | Status: DC | PRN

## 2020-12-11 MED ORDER — ONDANSETRON HCL 4 MG/2ML IJ SOLN: 4.0000 mg | INTRAMUSCULAR | Status: DC | PRN

## 2020-12-11 MED ORDER — DOCUSATE SODIUM 50 MG/5ML PO LIQD
100.0000 mg | Freq: Every day | ORAL | Status: DC
  Administered 2020-12-11: 100 mg via ORAL
  Filled 2020-12-11 (×2): qty 10

## 2020-12-11 MED ORDER — FENTANYL-BUPIVACAINE-NACL 0.5-0.125-0.9 MG/250ML-% EP SOLN: 12.0000 mL/h | EPIDURAL | Status: DC | PRN

## 2020-12-11 NOTE — Anesthesia Procedure Notes (Signed)
Epidural Patient location during procedure: OB Start time: 12/11/2020 4:49 AM End time: 12/11/2020 5:02 AM  Staffing Anesthesiologist: Trevor Iha, MD Performed: anesthesiologist   Preanesthetic Checklist Completed: patient identified, IV checked, site marked, risks and benefits discussed, surgical consent, monitors and equipment checked, pre-op evaluation and timeout performed  Epidural Patient position: sitting Prep: DuraPrep and site prepped and draped Patient monitoring: continuous pulse ox and blood pressure Approach: midline Location: L2-L3 Injection technique: LOR air  Needle:  Needle type: Tuohy  Needle gauge: 18 G Needle length: 9 cm and 9 Needle insertion depth: 8 cm Catheter type: closed end flexible Catheter size: 20 Guage Catheter at skin depth: 13 cm Test dose: negative  Assessment Events: blood not aspirated, injection not painful, no injection resistance, no paresthesia and negative IV test  Additional Notes Patient identified. Risks/Benefits/Options discussed with patient including but not limited to bleeding, infection, nerve damage, paralysis, failed block, incomplete pain control, headache, blood pressure changes, nausea, vomiting, reactions to medication both or allergic, itching and postpartum back pain. Confirmed with bedside nurse the patient's most recent platelet count. Confirmed with patient that they are not currently taking any anticoagulation, have any bleeding history or any family history of bleeding disorders. Patient expressed understanding and wished to proceed. All questions were answered. Sterile technique was used throughout the entire procedure. Please see nursing notes for vital signs. Test dose was given through epidural needle and negative prior to continuing to dose epidural or start infusion. Warning signs of high block given to the patient including shortness of breath, tingling/numbness in hands, complete motor block, or any  concerning symptoms with instructions to call for help. Patient was given instructions on fall risk and not to get out of bed. All questions and concerns addressed with instructions to call with any issues.   1 Attempt (S) . Patient tolerated procedure well.

## 2020-12-11 NOTE — Anesthesia Preprocedure Evaluation (Signed)
Anesthesia Evaluation  Patient identified by MRN, date of birth, ID band Patient awake    Reviewed: Allergy & Precautions, NPO status , Patient's Chart, lab work & pertinent test results  Airway Mallampati: II  TM Distance: >3 FB Neck ROM: Full    Dental no notable dental hx. (+) Teeth Intact   Pulmonary neg pulmonary ROS,    Pulmonary exam normal breath sounds clear to auscultation       Cardiovascular hypertension, Normal cardiovascular exam Rhythm:Regular Rate:Normal     Neuro/Psych negative neurological ROS     GI/Hepatic negative GI ROS, Lab Results      Component                Value               Date                      ALT                      130 (H)             12/10/2020                AST                      58 (H)              12/10/2020                ALKPHOS                  120                 12/10/2020                BILITOT                  1.1                 12/10/2020              Endo/Other    Renal/GU Lab Results      Component                Value               Date                      CREATININE               0.37 (L)            12/10/2020                BUN                      <5 (L)              12/10/2020                NA                       132 (L)             12/10/2020                K  3.8                 12/10/2020                CL                       101                 12/10/2020                CO2                      23                  12/10/2020                Musculoskeletal   Abdominal   Peds  Hematology   Anesthesia Other Findings   Reproductive/Obstetrics (+) Pregnancy                             Anesthesia Physical Anesthesia Plan  ASA: 3  Anesthesia Plan: Epidural   Post-op Pain Management:    Induction:   PONV Risk Score and Plan:   Airway Management Planned:   Additional Equipment:  None  Intra-op Plan:   Post-operative Plan:   Informed Consent: I have reviewed the patients History and Physical, chart, labs and discussed the procedure including the risks, benefits and alternatives for the proposed anesthesia with the patient or authorized representative who has indicated his/her understanding and acceptance.       Plan Discussed with:   Anesthesia Plan Comments: (17.4 Wk IUFD w Hx of PRE and elevated Liver Enzyme Hyperemesis gravidum for LEA 105 k plt will re check Plts before dc)        Anesthesia Quick Evaluation

## 2020-12-11 NOTE — Progress Notes (Signed)
Patient ID: Deborah Evans, female   DOB: [birth year], 25 y.o.   MRN: [id number]  Feeding tube removed  Results for orders placed or performed during the hospital encounter of 12/06/20 (from the past 24 hour(s))  Glucose, capillary     Status: Abnormal   Collection Time: 12/10/20 11:49 PM  Result Value Ref Range   Glucose-Capillary 109 (H) 70 - 99 mg/dL  CBC     Status: Abnormal   Collection Time: 12/11/20  3:02 AM  Result Value Ref Range   WBC 10.0 4.0 - 10.5 K/uL   RBC 3.66 (L) 3.87 - 5.11 MIL/uL   Hemoglobin 9.7 (L) 12.0 - 15.0 g/dL   HCT 37.9 (L) 02.4 - 09.7 %   MCV 83.9 80.0 - 100.0 fL   MCH 26.5 26.0 - 34.0 pg   MCHC 31.6 30.0 - 36.0 g/dL   RDW 35.3 29.9 - 24.2 %   Platelets 103 (L) 150 - 400 K/uL   nRBC 0.4 (H) 0.0 - 0.2 %  Glucose, capillary     Status: Abnormal   Collection Time: 12/11/20  3:45 AM  Result Value Ref Range   Glucose-Capillary 102 (H) 70 - 99 mg/dL  Comprehensive metabolic panel     Status: Abnormal   Collection Time: 12/11/20  6:46 AM  Result Value Ref Range   Sodium 131 (L) 135 - 145 mmol/L   Potassium 4.1 3.5 - 5.1 mmol/L   Chloride 101 98 - 111 mmol/L   CO2 24 22 - 32 mmol/L   Glucose, Bld 86 70 - 99 mg/dL   BUN 6 6 - 20 mg/dL   Creatinine, Ser 6.83 (L) 0.44 - 1.00 mg/dL   Calcium 8.0 (L) 8.9 - 10.3 mg/dL   Total Protein 5.1 (L) 6.5 - 8.1 g/dL   Albumin 2.1 (L) 3.5 - 5.0 g/dL   AST 41 15 - 41 U/L   ALT 103 (H) 0 - 44 U/L   Alkaline Phosphatase 122 38 - 126 U/L   Total Bilirubin 1.0 0.3 - 1.2 mg/dL   GFR, Estimated >41 >96 mL/min   Anion gap 6 5 - 15  Magnesium     Status: Abnormal   Collection Time: 12/11/20  6:46 AM  Result Value Ref Range   Magnesium 1.5 (L) 1.7 - 2.4 mg/dL  CBC     Status: Abnormal   Collection Time: 12/11/20  6:46 AM  Result Value Ref Range   WBC 9.6 4.0 - 10.5 K/uL   RBC 3.85 (L) 3.87 - 5.11 MIL/uL   Hemoglobin 10.5 (L) 12.0 - 15.0 g/dL   HCT 22.2 (L) 97.9 - 89.2 %   MCV 84.2 80.0 - 100.0 fL   MCH 27.3 26.0 - 34.0  pg   MCHC 32.4 30.0 - 36.0 g/dL   RDW 11.9 41.7 - 40.8 %   Platelets 106 (L) 150 - 400 K/uL   nRBC 0.9 (H) 0.0 - 0.2 %  Glucose, capillary     Status: Abnormal   Collection Time: 12/11/20  7:31 AM  Result Value Ref Range   Glucose-Capillary 102 (H) 70 - 99 mg/dL  Glucose, capillary     Status: None   Collection Time: 12/11/20 10:04 AM  Result Value Ref Range   Glucose-Capillary 90 70 - 99 mg/dL  CBC     Status: Abnormal   Collection Time: 12/11/20 11:31 AM  Result Value Ref Range   WBC 12.1 (H) 4.0 - 10.5 K/uL   RBC 3.74 (L) 3.87 - 5.11  MIL/uL   Hemoglobin 10.0 (L) 12.0 - 15.0 g/dL   HCT 67.8 (L) 93.8 - 10.1 %   MCV 82.4 80.0 - 100.0 fL   MCH 26.7 26.0 - 34.0 pg   MCHC 32.5 30.0 - 36.0 g/dL   RDW 75.1 02.5 - 85.2 %   Platelets 107 (L) 150 - 400 K/uL   nRBC 0.6 (H) 0.0 - 0.2 %  Comprehensive metabolic panel     Status: Abnormal   Collection Time: 12/11/20 11:31 AM  Result Value Ref Range   Sodium 129 (L) 135 - 145 mmol/L   Potassium 4.3 3.5 - 5.1 mmol/L   Chloride 103 98 - 111 mmol/L   CO2 22 22 - 32 mmol/L   Glucose, Bld 94 70 - 99 mg/dL   BUN 6 6 - 20 mg/dL   Creatinine, Ser 7.78 (L) 0.44 - 1.00 mg/dL   Calcium 7.8 (L) 8.9 - 10.3 mg/dL   Total Protein 4.5 (L) 6.5 - 8.1 g/dL   Albumin 1.9 (L) 3.5 - 5.0 g/dL   AST 35 15 - 41 U/L   ALT 88 (H) 0 - 44 U/L   Alkaline Phosphatase 129 (H) 38 - 126 U/L   Total Bilirubin 0.9 0.3 - 1.2 mg/dL   GFR, Estimated >24 >23 mL/min   Anion gap 4 (L) 5 - 15  hCG, quantitative, pregnancy     Status: Abnormal   Collection Time: 12/11/20 11:31 AM  Result Value Ref Range   hCG, Beta Chain, Quant, S 499,050 (H) <5 mIU/mL  Glucose, capillary     Status: None   Collection Time: 12/11/20  2:03 PM  Result Value Ref Range   Glucose-Capillary 94 70 - 99 mg/dL  Glucose, capillary     Status: Abnormal   Collection Time: 12/11/20  6:01 PM  Result Value Ref Range   Glucose-Capillary 110 (H) 70 - 99 mg/dL     HCG 536R iincreases suspicion of  partial molar pregnancy(GTD) Will order a CXR while in house and will follow HCG dowqn to 0, awaiting pathology  Lazaro Arms, MD  12/11/2020 8:55 PM

## 2020-12-11 NOTE — Anesthesia Postprocedure Evaluation (Signed)
Anesthesia Post Note  Patient: Deborah Evans  Procedure(s) Performed: AN AD HOC LABOR EPIDURAL     Patient location during evaluation: Mother Baby Anesthesia Type: Epidural Level of consciousness: awake and alert Pain management: pain level controlled Vital Signs Assessment: post-procedure vital signs reviewed and stable Respiratory status: spontaneous breathing, nonlabored ventilation and respiratory function stable Cardiovascular status: stable Postop Assessment: no headache, no backache, epidural receding, no apparent nausea or vomiting, patient able to bend at knees, adequate PO intake and able to ambulate Anesthetic complications: no   No notable events documented.  Last Vitals:  Vitals:   12/11/20 0932 12/11/20 1208  BP: 132/77 (!) 136/91  Pulse: (!) 132 (!) 119  Resp: 18 17  Temp: 36.9 C 36.9 C  SpO2: 99% 99%    Last Pain:  Vitals:   12/11/20 1208  TempSrc: Oral  PainSc:    Pain Goal: Patients Stated Pain Goal: 0 (12/11/20 0105)                 Laban Emperor

## 2020-12-11 NOTE — Progress Notes (Signed)
Patient stating she wants to eat.  She ate chicken noodle soup and a roll without nausea or vomiting.  Patient states she is feeling much better.  Drinking lemonade.

## 2020-12-12 ENCOUNTER — Encounter (HOSPITAL_COMMUNITY): Payer: Self-pay

## 2020-12-12 LAB — RPR: RPR Ser Ql: NONREACTIVE

## 2020-12-12 LAB — RUBELLA ANTIBODY, IGM: Rubella IgM: <20 AU/mL (ref 0.0–19.9)

## 2020-12-12 MED ORDER — IBUPROFEN 600 MG PO TABS: 600.0000 mg | ORAL_TABLET | Freq: Four times a day (QID) | ORAL | 0 refills | Status: DC

## 2020-12-12 MED ORDER — IBUPROFEN 600 MG PO TABS: 600.0000 mg | ORAL_TABLET | Freq: Four times a day (QID) | ORAL | Status: DC

## 2020-12-12 MED ORDER — METHIMAZOLE 5 MG PO TABS: 5.0000 mg | ORAL_TABLET | Freq: Two times a day (BID) | ORAL | 1 refills | Status: DC

## 2020-12-12 MED ORDER — NIFEDIPINE ER OSMOTIC RELEASE 30 MG PO TB24: 30.0000 mg | ORAL_TABLET | Freq: Every day | ORAL | 1 refills | Status: DC

## 2020-12-12 MED ORDER — MEDROXYPROGESTERONE ACETATE 150 MG/ML IM SUSP: 150.0000 mg | INTRAMUSCULAR | 3 refills | Status: DC

## 2020-12-12 MED ORDER — NIFEDIPINE ER OSMOTIC RELEASE 30 MG PO TB24
30.0000 mg | ORAL_TABLET | ORAL | Status: AC
  Administered 2020-12-12: 30 mg via ORAL
  Filled 2020-12-12: qty 1

## 2020-12-12 MED ORDER — COMPLETENATE 29-1 MG PO CHEW: 1.0000 | CHEWABLE_TABLET | Freq: Every day | ORAL | Status: DC

## 2020-12-12 NOTE — Discharge Summary (Signed)
Physician Discharge Summary  Patient ID: Deborah Evans MRN: [id number] DOB/AGE: 1995-09-20 25 y.o.  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 Underwent cytotec induciton successfully without incident Placenta and HCG levels suspicious for partial molar pregnancy, GTD  Consults:   Significant Diagnostic Studies: labs:   Treatments: IOL with delivery  Discharge Exam: Blood pressure (!) 140/99, pulse (!) 117, temperature 98.4 F (36.9 C), temperature source Oral, resp. rate 17, weight 60 kg, last menstrual period 08/10/2020, SpO2 99 %, unknown if currently breastfeeding. General appearance: alert, cooperative, and no distress GI: soft, non-tender; bowel sounds normal; no masses,  no organomegaly  Disposition: Discharge disposition: 01-Home or Self Care       Discharge Instructions     Diet - low sodium heart healthy   Complete by: As directed    Sexual activity   Complete by: As directed    No sex 6 weeks      Allergies as of 12/12/2020   No Known Allergies      Medication List     STOP taking these medications    metoCLOPramide 10 MG tablet Commonly known as: REGLAN   ondansetron 8 MG disintegrating tablet Commonly known as: Zofran ODT   oxyCODONE 5 MG/5ML solution Commonly known as: ROXICODONE   potassium chloride SA 20 MEQ  tablet Commonly known as: KLOR-CON   PRENATAL GUMMIES/DHA & FA PO   scopolamine 1 MG/3DAYS Commonly known as: TRANSDERM-SCOP       TAKE these medications    ibuprofen 600 MG tablet Commonly known as: ADVIL Take 1 tablet (600 mg total) by mouth every 6 (six) hours.   medroxyPROGESTERone 150 MG/ML injection Commonly known as: DEPO-PROVERA Inject 1 mL (150 mg total) into the muscle every 3 (three) months.   methimazole 5 MG tablet Commonly known as: TAPAZOLE Take 1 tablet (5 mg total) by mouth 2 (two) times daily.   NIFEdipine 30 MG 24 hr tablet Commonly known as: Procardia XL Take 1 tablet (30 mg total) by mouth daily.        Follow-up Information     Lazaro Arms, MD Follow up on 12/20/2020.   Specialties: Obstetrics and Gynecology, Radiology Why: follow up Contact information: [address] [place] [address] [phone]                 Signed: Lazaro Arms 12/12/2020, 8:48 PM

## 2020-12-12 NOTE — Progress Notes (Signed)
Reviewed discharge postpartum instructions with patient regarding medications, when to call MD/go to MAU, checking blood pressure daily, postpartum course, and to follow up with OB as scheduled for postpartum 1 week check and 4-6 week check. Patient verbalized understanding of discharge instructions and asked appropriate questions.

## 2020-12-12 NOTE — Plan of Care (Signed)
  Problem: Education: Goal: Knowledge of General Education information will improve Description: Including pain rating scale, medication(s)/side effects and non-pharmacologic comfort measures Outcome: Adequate for Discharge   Problem: Health Behavior/Discharge Planning: Goal: Ability to manage health-related needs will improve Outcome: Adequate for Discharge   Problem: Clinical Measurements: Goal: Ability to maintain clinical measurements within normal limits will improve Outcome: Adequate for Discharge Goal: Will remain free from infection Outcome: Adequate for Discharge Goal: Diagnostic test results will improve Outcome: Adequate for Discharge Goal: Respiratory complications will improve Outcome: Adequate for Discharge Goal: Cardiovascular complication will be avoided Outcome: Adequate for Discharge   Problem: Activity: Goal: Risk for activity intolerance will decrease Outcome: Adequate for Discharge   Problem: Nutrition: Goal: Adequate nutrition will be maintained Outcome: Adequate for Discharge   Problem: Coping: Goal: Level of anxiety will decrease Outcome: Adequate for Discharge   Problem: Elimination: Goal: Will not experience complications related to bowel motility Outcome: Adequate for Discharge Goal: Will not experience complications related to urinary retention Outcome: Adequate for Discharge   Problem: Pain Managment: Goal: General experience of comfort will improve Outcome: Adequate for Discharge   Problem: Safety: Goal: Ability to remain free from injury will improve Outcome: Adequate for Discharge   Problem: Skin Integrity: Goal: Risk for impaired skin integrity will decrease Outcome: Adequate for Discharge   Problem: Education: Goal: Knowledge of General Education information will improve Description: Including pain rating scale, medication(s)/side effects and non-pharmacologic comfort measures Outcome: Adequate for Discharge   Problem: Health  Behavior/Discharge Planning: Goal: Ability to manage health-related needs will improve Outcome: Adequate for Discharge   Problem: Clinical Measurements: Goal: Ability to maintain clinical measurements within normal limits will improve Outcome: Adequate for Discharge Goal: Will remain free from infection Outcome: Adequate for Discharge Goal: Diagnostic test results will improve Outcome: Adequate for Discharge Goal: Respiratory complications will improve Outcome: Adequate for Discharge Goal: Cardiovascular complication will be avoided Outcome: Adequate for Discharge   Problem: Activity: Goal: Risk for activity intolerance will decrease Outcome: Adequate for Discharge   Problem: Nutrition: Goal: Adequate nutrition will be maintained Outcome: Adequate for Discharge   Problem: Coping: Goal: Level of anxiety will decrease Outcome: Adequate for Discharge   Problem: Elimination: Goal: Will not experience complications related to bowel motility Outcome: Adequate for Discharge Goal: Will not experience complications related to urinary retention Outcome: Adequate for Discharge   Problem: Pain Managment: Goal: General experience of comfort will improve Outcome: Adequate for Discharge   Problem: Safety: Goal: Ability to remain free from injury will improve Outcome: Adequate for Discharge   Problem: Skin Integrity: Goal: Risk for impaired skin integrity will decrease Outcome: Adequate for Discharge   Problem: Education: Goal: Knowledge of disease or condition will improve Outcome: Adequate for Discharge Goal: Knowledge of the prescribed therapeutic regimen will improve Outcome: Adequate for Discharge Goal: Individualized Educational Video(s) Outcome: Adequate for Discharge

## 2020-12-12 NOTE — Discharge Summary (Signed)
Physician Discharge Summary  Patient ID: Deborah Evans MRN: [id number] DOB/AGE: 10/09/1995 25 y.o.  See other discharge sumjmary I did  Follow-up Information     Lazaro Arms, MD Follow up on 12/20/2020.   Specialties: Obstetrics and Gynecology, Radiology Why: follow up Contact information: [address] [place] [address] [phone]                 Signed: Lazaro Arms 12/12/2020, 7:47 AM

## 2020-12-13 ENCOUNTER — Telehealth: Payer: Self-pay

## 2020-12-13 ENCOUNTER — Other Ambulatory Visit: Payer: Self-pay

## 2020-12-13 DIAGNOSIS — O210 Mild hyperemesis gravidarum: Secondary | ICD-10-CM

## 2020-12-13 DIAGNOSIS — O132 Gestational [pregnancy-induced] hypertension without significant proteinuria, second trimester: Secondary | ICD-10-CM

## 2020-12-13 DIAGNOSIS — Z3A17 17 weeks gestation of pregnancy: Secondary | ICD-10-CM

## 2020-12-13 LAB — GLUCOSE, CAPILLARY: Glucose-Capillary: 107 mg/dL — ABNORMAL HIGH (ref 70–99)

## 2020-12-13 NOTE — Telephone Encounter (Signed)
..   Medicaid Managed Care   Unsuccessful Outreach Note  12/13/2020 Name: Deborah Evans MRN: [id number] DOB: Mar 21, 1996  Referred by: Patient, No Pcp Per (Inactive) Reason for referral : High Risk Managed Medicaid (Called patient today to get her scheduled for a phone visit with the [facility] RNCM. No one answered and there was not a VM.)   An unsuccessful telephone outreach was attempted today. The patient was referred to the case management team for assistance with care management and care coordination.   Follow Up Plan: The care management team will reach out to the patient again over the next 2 days.   Weston Settle Care Guide, High Risk Medicaid Managed Care Embedded Care Coordination [facility]  [facility]

## 2020-12-13 NOTE — Telephone Encounter (Signed)
Transition Care Management Unsuccessful Follow-up Telephone Call  Date of discharge and from where:  12/12/2020-[facility]  Attempts:  1st Attempt  Reason for unsuccessful TCM follow-up call:  Unable to reach patient

## 2020-12-13 NOTE — Patient Outreach (Signed)
[facility]) Care Management  12/13/2020  MARSELLA SUMAN 22-Dec-1995 [id number]  Managed Medicaid: [employer]  Referral for post hospital disease management, high risk.  Charlesetta Shanks, RN BSN CCM [facility]  [phone] business mobile phone Toll free office [phone]  Fax number: [phone] [email or web address]

## 2020-12-14 NOTE — Telephone Encounter (Signed)
Transition Care Management Unsuccessful Follow-up Telephone Call  Date of discharge and from where:  12/12/2020-[facility]  Attempts:  2nd Attempt  Reason for unsuccessful TCM follow-up call:  Unable to reach patient

## 2020-12-15 LAB — SURGICAL PATHOLOGY

## 2020-12-15 NOTE — Telephone Encounter (Signed)
Transition Care Management Unsuccessful Follow-up Telephone Call  Date of discharge and from where:  12/12/2020-[facility]  Attempts:  3rd Attempt  Reason for unsuccessful TCM follow-up call:  Unable to reach patient

## 2020-12-22 ENCOUNTER — Ambulatory Visit: Payer: Medicaid Other | Admitting: Advanced Practice Midwife

## 2020-12-22 ENCOUNTER — Other Ambulatory Visit: Payer: Self-pay

## 2020-12-22 ENCOUNTER — Other Ambulatory Visit: Payer: Self-pay | Admitting: Advanced Practice Midwife

## 2020-12-22 ENCOUNTER — Encounter: Payer: Self-pay | Admitting: Advanced Practice Midwife

## 2020-12-22 VITALS — BP 130/85 | HR 109 | Ht 68.0 in | Wt 128.8 lb

## 2020-12-22 DIAGNOSIS — O020 Blighted ovum and nonhydatidiform mole: Secondary | ICD-10-CM

## 2020-12-22 DIAGNOSIS — O0380 Unspecified complication following complete or unspecified spontaneous abortion: Secondary | ICD-10-CM | POA: Diagnosis not present

## 2020-12-22 NOTE — Progress Notes (Signed)
   GYN VISIT Patient name: Deborah Evans MRN [id number]  Date of birth: [birth year] Chief Complaint:   Follow-up (On miscarriage)  History of Present Illness:   Deborah Evans is a 25 y.o. 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  She had developed severe pre-e and hyperthyroidism as well as severe hyperemesis as a result of the molar process and therefore preg termination was the recommended course of action.  She reports feeling well other than mild intermittent cramping; reports having some vag bldg 8/19 x a few days but none now; still taking Procardia XL 30mg  and methimazole 5mg .   Patient's last menstrual period was 12/17/2020. The current method of family planning is Depo-Provera injections (received prior to d/c from the hospital). Last pap July 2019. Results were: NILM w/ HRHPV not done  Depression screen [facility] 2/9 12/02/2020 11/08/2017  Decreased Interest 3 0  Down, Depressed, Hopeless 3 0  PHQ - 2 Score 6 0  Altered sleeping 3 0  Tired, decreased energy 3 0  Change in appetite 3 0  Feeling bad or failure about yourself  3 0  Trouble concentrating 3 0  Moving slowly or fidgety/restless 3 0  Suicidal thoughts 3 0  PHQ-9 Score 27 0     GAD 7 : Generalized Anxiety Score 12/02/2020  Nervous, Anxious, on Edge 0  Control/stop worrying 3  Worry too much - different things 3  Trouble relaxing 3  Restless 3  Easily annoyed or irritable 3  Afraid - awful might happen 0  Total GAD 7 Score 15     Review of Systems:   Pertinent items are noted in HPI Denies fever/chills, dizziness, headaches, visual disturbances, fatigue, shortness of breath, chest pain, abdominal pain, vomiting, abnormal vaginal discharge/itching/odor/irritation, problems with periods, bowel movements, urination, or intercourse unless otherwise stated above.  Pertinent History Reviewed:  Reviewed past medical,surgical, social, obstetrical  and family history.  Reviewed problem list, medications and allergies. Physical Assessment:   Vitals:   12/22/20 1108  BP: 130/85  Pulse: (!) 109  Weight: 128 lb 12.8 oz (58.4 kg)  Height: 5\' 8"  (1.727 m)  Body mass index is 19.58 kg/m.       Physical Examination:   General appearance: alert, well appearing, and in no distress  Mental status: alert, oriented to person, place, and time  Skin: warm & dry   Cardiovascular: normal heart rate noted  Respiratory: normal respiratory effort, no distress  Abdomen: soft, non-tender   Pelvic: examination not indicated  Extremities: no edema    No results found for this or any previous visit (from the past 24 hour(s)).  Assessment & Plan:  1) S/p termination of molar preg @ 17wks> bHcg drawn today with f/u TBD by Dr 02/01/2021 based on result  2) Needs Pap> will get with annual after she stabilizes  Meds: No orders of the defined types were placed in this encounter.   Orders Placed This Encounter  Procedures   B-HCG Quant    Return for f/u to be determined by Dr 12/24/20.  CNM 12/22/2020 11:40 AM

## 2020-12-23 LAB — BETA HCG QUANT (REF LAB): hCG Quant: 990 m[IU]/mL

## 2020-12-24 ENCOUNTER — Other Ambulatory Visit: Payer: Self-pay

## 2020-12-24 ENCOUNTER — Inpatient Hospital Stay (HOSPITAL_COMMUNITY): Payer: Medicaid Other

## 2020-12-24 ENCOUNTER — Encounter (HOSPITAL_COMMUNITY): Payer: Self-pay | Admitting: *Deleted

## 2020-12-24 ENCOUNTER — Observation Stay (HOSPITAL_COMMUNITY)
Admission: AD | Admit: 2020-12-24 | Discharge: 2020-12-25 | Disposition: A | Discharge status: Home / Self Care | Payer: Medicaid Other | Attending: Obstetrics and Gynecology | Admitting: Obstetrics and Gynecology

## 2020-12-24 DIAGNOSIS — Z20822 Contact with and (suspected) exposure to covid-19: Secondary | ICD-10-CM | POA: Diagnosis not present

## 2020-12-24 DIAGNOSIS — R000 Tachycardia, unspecified: Secondary | ICD-10-CM | POA: Diagnosis not present

## 2020-12-24 DIAGNOSIS — R9389 Abnormal findings on diagnostic imaging of other specified body structures: Secondary | ICD-10-CM | POA: Diagnosis not present

## 2020-12-24 DIAGNOSIS — N939 Abnormal uterine and vaginal bleeding, unspecified: Secondary | ICD-10-CM | POA: Diagnosis not present

## 2020-12-24 DIAGNOSIS — Z3A17 17 weeks gestation of pregnancy: Secondary | ICD-10-CM | POA: Diagnosis not present

## 2020-12-24 DIAGNOSIS — N8500 Endometrial hyperplasia, unspecified: Secondary | ICD-10-CM | POA: Diagnosis not present

## 2020-12-24 DIAGNOSIS — N83201 Unspecified ovarian cyst, right side: Secondary | ICD-10-CM | POA: Diagnosis not present

## 2020-12-24 HISTORY — DX: Thyrotoxicosis, unspecified without thyrotoxic crisis or storm: E05.90

## 2020-12-24 HISTORY — DX: Essential (primary) hypertension: I10

## 2020-12-24 LAB — COMPREHENSIVE METABOLIC PANEL
ALT: 13 U/L (ref 0–44)
AST: 17 U/L (ref 15–41)
Albumin: 2.9 g/dL — ABNORMAL LOW (ref 3.5–5.0)
Alkaline Phosphatase: 138 U/L — ABNORMAL HIGH (ref 38–126)
Anion gap: 5 (ref 5–15)
BUN: 8 mg/dL (ref 6–20)
CO2: 23 mmol/L (ref 22–32)
Calcium: 8 mg/dL — ABNORMAL LOW (ref 8.9–10.3)
Chloride: 110 mmol/L (ref 98–111)
Creatinine, Ser: 0.42 mg/dL — ABNORMAL LOW (ref 0.44–1.00)
GFR, Estimated: >60 mL/min (ref >60)
Glucose, Bld: 101 mg/dL — ABNORMAL HIGH (ref 70–99)
Potassium: 3.3 mmol/L — ABNORMAL LOW (ref 3.5–5.1)
Sodium: 138 mmol/L (ref 135–145)
Total Bilirubin: 0.7 mg/dL (ref 0.3–1.2)
Total Protein: 6 g/dL — ABNORMAL LOW (ref 6.5–8.1)

## 2020-12-24 LAB — RESP PANEL BY RT-PCR (FLU A&B, COVID) ARPGX2
Influenza A by PCR: NEGATIVE
Influenza B by PCR: NEGATIVE
SARS Coronavirus 2 by RT PCR: NEGATIVE

## 2020-12-24 LAB — HCG, QUANTITATIVE, PREGNANCY: hCG, Beta Chain, Quant, S: 615 m[IU]/mL — ABNORMAL HIGH (ref <5)

## 2020-12-24 LAB — CBC
HCT: 35.4 % — ABNORMAL LOW (ref 36.0–46.0)
Hemoglobin: 11.3 g/dL — ABNORMAL LOW (ref 12.0–15.0)
MCH: 27.1 pg (ref 26.0–34.0)
MCHC: 31.9 g/dL (ref 30.0–36.0)
MCV: 84.9 fL (ref 80.0–100.0)
Platelets: 315 10*3/uL (ref 150–400)
RBC: 4.17 MIL/uL (ref 3.87–5.11)
RDW: 15.1 % (ref 11.5–15.5)
WBC: 6.9 10*3/uL (ref 4.0–10.5)
nRBC: 0 % (ref 0.0–0.2)

## 2020-12-24 LAB — TSH: TSH: 0.044 u[IU]/mL — ABNORMAL LOW (ref 0.350–4.500)

## 2020-12-24 LAB — TYPE AND SCREEN
ABO/RH(D): O POS
Antibody Screen: NEGATIVE

## 2020-12-24 MED ORDER — METOPROLOL TARTRATE 5 MG/5ML IV SOLN
2.5000 mg | Freq: Once | INTRAVENOUS | Status: AC
  Administered 2020-12-24: 2.5 mg via INTRAVENOUS
  Filled 2020-12-24: qty 5

## 2020-12-24 MED ORDER — METHIMAZOLE 5 MG PO TABS
5.0000 mg | ORAL_TABLET | Freq: Two times a day (BID) | ORAL | Status: DC
  Administered 2020-12-25: 5 mg via ORAL
  Filled 2020-12-24 (×2): qty 1

## 2020-12-24 MED ORDER — METOPROLOL TARTRATE 25 MG PO TABS
25.0000 mg | ORAL_TABLET | Freq: Two times a day (BID) | ORAL | Status: DC
  Filled 2020-12-24 (×2): qty 1

## 2020-12-24 MED ORDER — POTASSIUM CHLORIDE 2 MEQ/ML IV SOLN
INTRAVENOUS | Status: DC
  Filled 2020-12-24 (×2): qty 1000

## 2020-12-24 MED ORDER — LACTATED RINGERS IV BOLUS
1000.0000 mL | Freq: Once | INTRAVENOUS | Status: AC
  Administered 2020-12-24: 1000 mL via INTRAVENOUS

## 2020-12-24 MED ORDER — MISOPROSTOL 100 MCG PO TABS
600.0000 ug | ORAL_TABLET | Freq: Once | ORAL | Status: AC
  Administered 2020-12-24: 600 ug via BUCCAL
  Filled 2020-12-24: qty 6

## 2020-12-24 NOTE — MAU Provider Note (Signed)
History     CSN: [id number]  Arrival date and time: 12/24/20 1902   Event Date/Time   First Provider Initiated Contact with Patient 12/24/20 2148      Chief Complaint  Patient presents with   Vaginal Bleeding   Deborah Evans is a 25 y.o. G3P1021 at 64 Days s/p IAB for HELLP syndrome, hyperthyroidism that was explained through a placental pathology showing a molar pregnancy.  After delivery she was discharged home with improving bloodwork. She was seen 2 days ago for a follow up HCG, which was 990.  She presents today, via transfer, for Vaginal Bleeding.  She was initially seen at [facility] d/t bleeding.  She was given Cytotoec buccally as well as Metoprolol for tachycardia and hyperthyroidism.  She reports she passed one large orange sized clot prior to leaving that hospital.  She denies dizziness, lightheadedness, cramping, or abdominal pain. She states her bleeding currently is small.     OB History     Gravida  3   Para  1   Term  1   Preterm      AB  2   Living  1      SAB  1   IAB  1   Ectopic      Multiple  0   Live Births  1           Past Medical History:  Diagnosis Date   Depression    when going through hyperemesis with first preg, ok now   Hyperemesis gravidarum    Miscarriage    12/13/20    Past Surgical History:  Procedure Laterality Date   CHOLECYSTECTOMY N/A 11/25/2020   Procedure: LAPAROSCOPIC CHOLECYSTECTOMY;  Surgeon: Harriette Bouillon, MD;  Location: [facility];  Service: General;  Laterality: N/A;   DILATION AND CURETTAGE OF UTERUS N/A 07/15/2014   Procedure: SUCTION DILATATION AND CURETTAGE;  Surgeon: Lazaro Arms, MD;  Location: [facility] ORS;  Service: Gynecology;  Laterality: N/A;    Family History  Problem Relation Age of Onset   Healthy Mother    Aneurysm Father    Hypertension Maternal Grandmother    ADD / ADHD Neg Hx    Alcohol abuse Neg Hx    Anxiety disorder Neg Hx    Arthritis Neg Hx    Birth defects Neg Hx     Asthma Neg Hx    Cancer Neg Hx    COPD Neg Hx    Depression Neg Hx    Diabetes Neg Hx    Drug abuse Neg Hx    Early death Neg Hx    Hearing loss Neg Hx    Heart disease Neg Hx    Hyperlipidemia Neg Hx    Intellectual disability Neg Hx    Kidney disease Neg Hx    Learning disabilities Neg Hx    Miscarriages / Stillbirths Neg Hx    Obesity Neg Hx    Stroke Neg Hx    Vision loss Neg Hx    Varicose Veins Neg Hx     Social History   Tobacco Use   Smoking status: Never   Smokeless tobacco: Never  Vaping Use   Vaping Use: Never used  Substance Use Topics   Alcohol use: No   Drug use: No    Allergies: No Known Allergies  Medications Prior to Admission  Medication Sig Dispense Refill Last Dose   ibuprofen (ADVIL) 600 MG tablet Take 1 tablet (600 mg total) by mouth every  6 (six) hours. 30 tablet 0    medroxyPROGESTERone (DEPO-PROVERA) 150 MG/ML injection Inject 1 mL (150 mg total) into the muscle every 3 (three) months. 1 mL 3    methimazole (TAPAZOLE) 5 MG tablet Take 1 tablet (5 mg total) by mouth 2 (two) times daily. 60 tablet 1    NIFEdipine (PROCARDIA XL) 30 MG 24 hr tablet Take 1 tablet (30 mg total) by mouth daily. 30 tablet 1     Review of Systems  Gastrointestinal:  Negative for abdominal pain, nausea and vomiting.  Genitourinary:  Positive for vaginal bleeding. Negative for difficulty urinating and dysuria.  Neurological:  Negative for dizziness, light-headedness and headaches.  Physical Exam   Blood pressure 113/80, pulse 91, temperature 98.9 F (37.2 C), temperature source Oral, resp. rate 19, height 5\' 8"  (1.727 m), last menstrual period 12/17/2020, SpO2 100 %.  Physical Exam Vitals reviewed.  Constitutional:      General: She is not in acute distress.    Appearance: Normal appearance.  HENT:     Head: Normocephalic and atraumatic.  Eyes:     Conjunctiva/sclera: Conjunctivae normal.  Cardiovascular:     Rate and Rhythm: Normal rate.     Heart sounds:  Normal heart sounds.  Pulmonary:     Effort: Pulmonary effort is normal. No respiratory distress.     Breath sounds: Normal breath sounds.  Abdominal:     General: Bowel sounds are normal.     Palpations: Abdomen is soft.     Tenderness: There is no abdominal tenderness.  Musculoskeletal:        General: Normal range of motion.     Cervical back: Normal range of motion.  Skin:    General: Skin is warm and dry.  Neurological:     Mental Status: She is alert and oriented to person, place, and time.  Psychiatric:        Mood and Affect: Mood normal.        Behavior: Behavior normal.        Thought Content: Thought content normal.    MAU Course  Procedures Results for orders placed or performed during the hospital encounter of 12/24/20 (from the past 24 hour(s))  CBC     Status: Abnormal   Collection Time: 12/24/20  7:38 PM  Result Value Ref Range   WBC 6.9 4.0 - 10.5 K/uL   RBC 4.17 3.87 - 5.11 MIL/uL   Hemoglobin 11.3 (L) 12.0 - 15.0 g/dL   HCT 12/26/20 (L) 63.1 - 49.7 %   MCV 84.9 80.0 - 100.0 fL   MCH 27.1 26.0 - 34.0 pg   MCHC 31.9 30.0 - 36.0 g/dL   RDW 02.6 37.8 - 58.8 %   Platelets 315 150 - 400 K/uL   nRBC 0.0 0.0 - 0.2 %  hCG, quantitative, pregnancy     Status: Abnormal   Collection Time: 12/24/20  7:38 PM  Result Value Ref Range   hCG, Beta Chain, Quant, S 615 (H) <5 mIU/mL  Comprehensive metabolic panel     Status: Abnormal   Collection Time: 12/24/20  7:38 PM  Result Value Ref Range   Sodium 138 135 - 145 mmol/L   Potassium 3.3 (L) 3.5 - 5.1 mmol/L   Chloride 110 98 - 111 mmol/L   CO2 23 22 - 32 mmol/L   Glucose, Bld 101 (H) 70 - 99 mg/dL   BUN 8 6 - 20 mg/dL   Creatinine, Ser 12/26/20 (L) 0.44 - 1.00 mg/dL  Calcium 8.0 (L) 8.9 - 10.3 mg/dL   Total Protein 6.0 (L) 6.5 - 8.1 g/dL   Albumin 2.9 (L) 3.5 - 5.0 g/dL   AST 17 15 - 41 U/L   ALT 13 0 - 44 U/L   Alkaline Phosphatase 138 (H) 38 - 126 U/L   Total Bilirubin 0.7 0.3 - 1.2 mg/dL   GFR, Estimated >40 >34  mL/min   Anion gap 5 5 - 15  TSH     Status: Abnormal   Collection Time: 12/24/20  7:39 PM  Result Value Ref Range   TSH 0.044 (L) 0.350 - 4.500 uIU/mL  Type and screen [facility]     Status: None   Collection Time: 12/24/20  7:39 PM  Result Value Ref Range   ABO/RH(D) O POS    Antibody Screen NEG    Sample Expiration      12/27/2020,2359 Performed at [facility], [address], [place], [place] [address]   Resp Panel by RT-PCR (Flu A&B, Covid) Nasopharyngeal Swab     Status: None   Collection Time: 12/24/20  8:22 PM   Specimen: Nasopharyngeal Swab; Nasopharyngeal(NP) swabs in vial transport medium  Result Value Ref Range   SARS Coronavirus 2 by RT PCR NEGATIVE NEGATIVE   Influenza A by PCR NEGATIVE NEGATIVE   Influenza B by PCR NEGATIVE NEGATIVE   US PELVIC COMPLETE WITH TRANSVAGINAL  Result Date: 12/24/2020 CLINICAL DATA:  Partial molar pregnancy with retained products of conception. TAB 2 weeks ago. Beta hcg 615. EXAM: TRANSABDOMINAL AND TRANSVAGINAL ULTRASOUND OF PELVIS TECHNIQUE: Both transabdominal and transvaginal ultrasound examinations of the pelvis were performed. Transabdominal technique was performed for global imaging of the pelvis including uterus, ovaries, adnexal regions, and pelvic cul-de-sac. It was necessary to proceed with endovaginal exam following the transabdominal exam to visualize the endometrium. COMPARISON:  None FINDINGS: Uterus Measurements: 9.3 x 6 x 6.9 cm = volume: 203 mL. No fibroids or other mass visualized. Endometrium Thickness: 16 mm.  Heterogeneous and hypervascular. Right ovary Measurements: 5.4 x 4.2 x 3.9 cm = volume: 46 mL. Multiple cysts noted largest measuring 4 x 3 x 3.7 cm. Left ovary Measurements: 4.2 x 2.6 x 3 cm = volume: 17 mL. Normal appearance/no adnexal mass. Other findings No abnormal free fluid. IMPRESSION: 1. Hypervascular heterogeneous thickened endometrium measuring up to 16 mm. Finding likely represents retained products of  conception. Differential diagnosis includes gestational trophoblastic neoplasm versus endometritis. 2. Right simple ovarian cysts measuring up to 4 cm. Theca lutein cysts not excluded. Recommend short-term (4-6 weeks) repeat ultrasound for evaluation of resolution. Electronically Signed   By: Tish Frederickson M.D.   On: 12/24/2020 22:34      MDM Ultrasound Assessment and Plan  25 year old G3P1021 Delayed PPH  -Provider to bedside to discuss POC. -Will send for [place] and await results.  [name] 12/24/2020, 9:48 PM   Reassessment (11:21 PM)  -[place] results return as above. -Dr. Mitzi Hansen informed of findings.  States he will be to bedside to discuss with patient. -Patient updated. Instructed not to eat or drink until management plan is decided.   Reassessment (11:46 PM) -Patient to be admitted and plan for D&C in am.  [name] MSN, CNM Advanced Practice Provider, [facility]

## 2020-12-24 NOTE — ED Triage Notes (Signed)
Pt in from home c/o vaginal bleeding since 12/10/20 when she had a procedure at [facility] for a mole pregnancy, pt reports ongoing bleeding with worsening today @ 4pm, reports saturating a pad Q15 mins since that time, reports clots present, pt tachycardic in triage, pt transported to room, A&O x4

## 2020-12-24 NOTE — ED Notes (Signed)
Report given to [facility]. 

## 2020-12-24 NOTE — ED Notes (Signed)
Pt here with reports of having a molar pregnancy and needing to have an abortion on 8/13 at [facility]-pt reports scant/light bleeding post discharge but today starting bleeding heavily after 4pm. Pt reports soaking more than 15 pads with clots. Pt on cardiac monitor, ekg obtained. Pt is tachy. Pt reports BP s/p pregnancy. Pt is G3P1A2. Pt denies pain

## 2020-12-24 NOTE — ED Provider Notes (Signed)
[facility] EMERGENCY DEPARTMENT Provider Note   CSN: [id number] Arrival date & time: 12/24/20  1902     History Chief Complaint  Patient presents with   Vaginal Bleeding    Deborah Evans is a 25 y.o. female.  HPI  25 year old female with a history of depression, hyperemesis gravidarum, miscarriage, who presents to the emergency department today for evaluation of vaginal bleeding.  Of note patient was recently pregnant and delivered at [redacted] weeks gestation last week.  She was thought to have a partial molar pregnancy and was admitted at that time.  She was found to be hyperthyroid at that time was started on methimazole.  She was also discharged on methimazole and nifedipine.  States she started experiencing vaginal bleeding today around 4 PM.  Has been bleeding through a pad every 15 minutes since this occurred.  She denies any pelvic pain, lightheadedness, syncope or other associated complaints.  She was noted to be tachycardic however she denies any palpitations, chest pain or shortness of breath.  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   Protein-calorie malnutrition, severe 12/09/2020   Hyperemesis complicating pregnancy, antepartum 12/07/2020   Hyperthyroidism in pregnancy, antepartum 12/06/2020   Transient hypertension of pregnancy in second trimester 11/27/2020   Elevated LFTs    Hyperemesis affecting pregnancy, antepartum 12/03/2017    Past Surgical History:  Procedure Laterality Date   CHOLECYSTECTOMY N/A 11/25/2020   Procedure: LAPAROSCOPIC CHOLECYSTECTOMY;  Surgeon: Harriette Bouillon, MD;  Location: [facility] OR;  Service: General;  Laterality: N/A;   DILATION AND CURETTAGE OF UTERUS N/A 07/15/2014   Procedure: SUCTION DILATATION AND CURETTAGE;  Surgeon: Lazaro Arms,  MD;  Location: [facility];  Service: Gynecology;  Laterality: N/A;     OB History     Gravida  3   Para  1   Term  1   Preterm      AB  2   Living  1      SAB  1   IAB  1   Ectopic      Multiple  0   Live Births  1           Family History  Problem Relation Age of Onset   Healthy Mother    Aneurysm Father    Hypertension Maternal Grandmother    ADD / ADHD Neg Hx    Alcohol abuse Neg Hx    Anxiety disorder Neg Hx    Arthritis Neg Hx    Birth defects Neg Hx    Asthma Neg Hx    Cancer Neg Hx    COPD Neg Hx    Depression Neg Hx    Diabetes Neg Hx    Drug abuse Neg Hx    Early death Neg Hx    Hearing loss Neg Hx    Heart disease Neg Hx    Hyperlipidemia Neg Hx    Intellectual disability Neg Hx    Kidney disease Neg Hx    Learning disabilities Neg Hx    Miscarriages / Stillbirths Neg Hx    Obesity Neg Hx    Stroke Neg Hx    Vision loss Neg Hx    Varicose Veins Neg Hx     Social History  Tobacco Use   Smoking status: Never   Smokeless tobacco: Never  Vaping Use   Vaping Use: Never used  Substance Use Topics   Alcohol use: No   Drug use: No    Home Medications Prior to Admission medications   Medication Sig Start Date End Date Taking? Authorizing Provider  ibuprofen (ADVIL) 600 MG tablet Take 1 tablet (600 mg total) by mouth every 6 (six) hours. 12/12/20   Lazaro Arms, MD  medroxyPROGESTERone (DEPO-PROVERA) 150 MG/ML injection Inject 1 mL (150 mg total) into the muscle every 3 (three) months. 12/12/20   Lazaro Arms, MD  methimazole (TAPAZOLE) 5 MG tablet Take 1 tablet (5 mg total) by mouth 2 (two) times daily. 12/12/20   Lazaro Arms, MD  NIFEdipine (PROCARDIA XL) 30 MG 24 hr tablet Take 1 tablet (30 mg total) by mouth daily. 12/12/20   Lazaro Arms, MD    Allergies    Patient has no known allergies.  Review of Systems   Review of Systems  Constitutional:  Negative for fever.  HENT:  Negative for ear pain and sore throat.   Eyes:   Negative for visual disturbance.  Respiratory:  Negative for cough and shortness of breath.   Cardiovascular:  Negative for chest pain.  Gastrointestinal:  Negative for abdominal pain, nausea and vomiting.  Genitourinary:  Positive for vaginal bleeding. Negative for dysuria, hematuria, pelvic pain, urgency and vaginal discharge.  Musculoskeletal:  Negative for back pain.  Skin:  Negative for color change and rash.  Neurological:  Negative for seizures and syncope.  All other systems reviewed and are negative.  Physical Exam Updated Vital Signs BP 113/80   Pulse 91   Temp 98.9 F (37.2 C) (Oral)   Resp 19   Ht 5\' 8"  (1.727 m)   LMP 12/17/2020   SpO2 100%   BMI 19.58 kg/m   Physical Exam Vitals and nursing note reviewed.  Constitutional:      General: She is not in acute distress.    Appearance: She is well-developed.  HENT:     Head: Normocephalic and atraumatic.  Eyes:     Conjunctiva/sclera: Conjunctivae normal.  Cardiovascular:     Rate and Rhythm: Regular rhythm. Tachycardia present.     Heart sounds: Normal heart sounds. No murmur heard. Pulmonary:     Effort: Pulmonary effort is normal. No respiratory distress.     Breath sounds: Normal breath sounds. No wheezing, rhonchi or rales.  Abdominal:     General: Bowel sounds are normal.     Palpations: Abdomen is soft.     Tenderness: There is no abdominal tenderness. There is no guarding or rebound.  Musculoskeletal:     Cervical back: Neck supple.  Skin:    General: Skin is warm and dry.  Neurological:     Mental Status: She is alert.    ED Results / Procedures / Treatments   Labs (all labs ordered are listed, but only abnormal results are displayed) Labs Reviewed  CBC - Abnormal; Notable for the following components:      Result Value   Hemoglobin 11.3 (*)    HCT 35.4 (*)    All other components within normal limits  HCG, QUANTITATIVE, PREGNANCY - Abnormal; Notable for the following components:   hCG,  Beta Chain, Quant, S 615 (*)    All other components within normal limits  TSH - Abnormal; Notable for the following components:   TSH 0.044 (*)    All  other components within normal limits  COMPREHENSIVE METABOLIC PANEL - Abnormal; Notable for the following components:   Potassium 3.3 (*)    Glucose, Bld 101 (*)    Creatinine, Ser 0.42 (*)    Calcium 8.0 (*)    Total Protein 6.0 (*)    Albumin 2.9 (*)    Alkaline Phosphatase 138 (*)    All other components within normal limits  RESP PANEL BY RT-PCR (FLU A&B, COVID) ARPGX2  T4, FREE  T3, FREE  TYPE AND SCREEN    EKG EKG Interpretation  Date/Time:  Friday December 24 2020 19:38:11 EDT Ventricular Rate:  115 PR Interval:  140 QRS Duration: 75 QT Interval:  318 QTC Calculation: 440 R Axis:   93 Text Interpretation: Sinus tachycardia Borderline right axis deviation Since last tracing Rate faster Confirmed by Susy Frizzle [id number]) on 12/24/2020 7:42:39 PM  Radiology No results found.  Procedures Procedures   Medications Ordered in ED Medications  metoprolol tartrate (LOPRESSOR) tablet 25 mg (has no administration in time range)  metoprolol tartrate (LOPRESSOR) injection 2.5 mg (2.5 mg Intravenous Given 12/24/20 2020)  lactated ringers bolus 1,000 mL (1,000 mLs Intravenous Transfusing/Transfer 12/24/20 2044)  misoprostol (CYTOTEC) tablet 600 mcg (600 mcg Buccal Given 12/24/20 2005)    ED Course  I have reviewed the triage vital signs and the nursing notes.  Pertinent labs & imaging results that were available during my care of the patient were reviewed by me and considered in my medical decision making (see chart for details).    MDM Rules/Calculators/A&P                          24 year old female presenting the emergency department today for evaluation of vaginal bleeding.  Patient is currently postpartum and had a recent partial molar pregnancy last week.  She also had hyperthyroidism at that time and been started on  methimazole.  She presents today with tachycardia and vaginal bleeding.  Her abdomen is soft and nontender.  Her blood pressure is stable.    7:31 PM discussed the case with Dr. Adrian Blackwater, OB/GYN who was present at the bedside and evaluated the patient.  He placed orders and made medication recommendations.   7:40 PM spoke with Dr. Vergie Living at the MAU who accepts this patient in transfer she will need an ultrasound which is not available here in the ED at this time.  Patient was transferred to [facility], MAU in stable condition.   Final Clinical Impression(s) / ED Diagnoses Final diagnoses:  Vaginal bleeding    Rx / DC Orders ED Discharge Orders     None        Rayne Du 12/24/20 2152    Pollyann Savoy, MD 12/24/20 [phone]

## 2020-12-24 NOTE — Progress Notes (Addendum)
Patient ID: Deborah PAGLIARULO, female   DOB: 1995/07/13, 25 y.o.   MRN: [id number]  Approached by Leonia Corona, PA-C regarding this patient. Briefly, 25 yo G3P1021 approximately 2 weeks s/p IAB for HELLP syndrome, hyperthyroidism. Placental pathology showed molar pregnancy, which explains the HELLP syndrome and hyperthyroidism. She delivered, then was discharged home with improving bloodwork. She was seen 2 days ago for a follow up HCG, which was 990.   Today, she presents with vaginal bleeding that started around 4pm. She has been filling saturated pads about every 20-30 minutes. Denies weakness, lightheadedness, fevers, abdominal pain.   BP 132/85 (BP Location: Right Arm)   Pulse (!) 142   Temp 98.1 F (36.7 C) (Oral)   Ht 5\' 8"  (1.727 m)   LMP 12/17/2020   SpO2 99%   BMI 19.58 kg/m   Patient briefly examined -  NAD, A&Ox3 Tachycardic rate, but regular. abdomen soft, nontender.  No edema.  Will check CBC, CMP, HCG, Type and Screen, thyroid studies (TSH, T3, T4). Will need 12/19/2020 to r/o retained POCs, which is unavailable at [facility]. Will transfer to [facility].   Cytotec [facility] given buccally.  Due to hyperthyroidism and tachycardia -  Start IV fluid bolus. Metoprolol IV 2.5mg  and 25mg  PO Should be discharged on Metoprolol 25mg  BID.  I called my colleague Dr , who accepted the patient. Signout given to , CNM in MAU.  , DO 12/24/2020 7:58 PM

## 2020-12-24 NOTE — H&P (Signed)
Obstetrics & Gynecology H&P   Date of Admission: 12/24/2020   Requesting Provider: [facility]  Primary OBGYN: [facility] Primary Care Provider: Pcp, No  Reason for Admission: retained products of conception  History of Present Illness: Ms. Bezek is a 25 y.o. [id number] (Patient's last menstrual period was 12/17/2020.), with the above CC. PMHx is significant for recent, 8/13 IOL and vag delivery at 16wks due to severe pre-eclampsia (pregnancy induced hyperthyroidism, HTN, elevated LFTs) with placental pathology c/w GTD.  Patient was discharged to home on 8/14 and was last seen on 8/24 and was doing well with decreasing hCGs (990 from 449k on 8/13).  She came to [facility] ED with VB and she was also tachycardic. Patient evaluated by Dr. Adrian Blackwater and he switched her to lopressor from her home procardia, which she took this morning and called for transfer to the MAU in order to do an u/s.  Hgb higher at the ED then on her discharge.  On arrival to MAU, patient has had minimal VB. She had not had a pelvic exam prior to coming to the MAU.   ROS: A 12-point review of systems was performed and negative, except as stated in the above HPI.  OBGYN History: As per HPI. OB History  Gravida Para Term Preterm AB Living  3 1 1   2 1   SAB IAB Ectopic Multiple Live Births  1 1   0 1    # Outcome Date GA Lbr Len/2nd Weight Sex Delivery Anes PTL Lv  3 IAB 2022          2 Term 04/25/18 [redacted]w[redacted]d 13:50 / 00:28 2980 g F Vag-Spont EPI  LIV  1 SAB 06/2014 [redacted]w[redacted]d            Past Medical History: Past Medical History:  Diagnosis Date   Depression    when going through hyperemesis with first preg, ok now   Hyperemesis gravidarum    Miscarriage    12/13/20    Past Surgical History: Past Surgical History:  Procedure Laterality Date   CHOLECYSTECTOMY N/A 11/25/2020   Procedure: LAPAROSCOPIC CHOLECYSTECTOMY;  Surgeon: 11/27/2020, MD;  Location: [facility] OR;  Service: General;  Laterality: N/A;   DILATION AND  CURETTAGE OF UTERUS N/A 07/15/2014   Procedure: SUCTION DILATATION AND CURETTAGE;  Surgeon: 07/17/2014, MD;  Location: [facility] ORS;  Service: Gynecology;  Laterality: N/A;    Family History:  Family History  Problem Relation Age of Onset   Healthy Mother    Aneurysm Father    Hypertension Maternal Grandmother    ADD / ADHD Neg Hx    Alcohol abuse Neg Hx    Anxiety disorder Neg Hx    Arthritis Neg Hx    Birth defects Neg Hx    Asthma Neg Hx    Cancer Neg Hx    COPD Neg Hx    Depression Neg Hx    Diabetes Neg Hx    Drug abuse Neg Hx    Early death Neg Hx    Hearing loss Neg Hx    Heart disease Neg Hx    Hyperlipidemia Neg Hx    Intellectual disability Neg Hx    Kidney disease Neg Hx    Learning disabilities Neg Hx    Miscarriages / Stillbirths Neg Hx    Obesity Neg Hx    Stroke Neg Hx    Vision loss Neg Hx    Varicose Veins Neg Hx     Social History:  Social History   Socioeconomic History   Marital status: Single    Spouse name: Not on file   Number of children: Not on file   Years of education: Not on file   Highest education level: Not on file  Occupational History   Occupation: none  Tobacco Use   Smoking status: Never   Smokeless tobacco: Never  Vaping Use   Vaping Use: Never used  Substance and Sexual Activity   Alcohol use: No   Drug use: No   Sexual activity: Not Currently    Birth control/protection: Injection  Other Topics Concern   Not on file  Social History Narrative   Not on file   Social Determinants of Health   Financial Resource Strain: Not on file  Food Insecurity: Not on file  Transportation Needs: Not on file  Physical Activity: Not on file  Stress: Not on file  Social Connections: Not on file  Intimate Partner Violence: Not on file    Allergy: No Known Allergies  Current Outpatient Medications: Medications Prior to Admission  Medication Sig Dispense Refill Last Dose   ibuprofen (ADVIL) 600 MG tablet Take 1 tablet (600 mg  total) by mouth every 6 (six) hours. 30 tablet 0    medroxyPROGESTERone (DEPO-PROVERA) 150 MG/ML injection Inject 1 mL (150 mg total) into the muscle every 3 (three) months. 1 mL 3    methimazole (TAPAZOLE) 5 MG tablet Take 1 tablet (5 mg total) by mouth 2 (two) times daily. 60 tablet 1    NIFEdipine (PROCARDIA XL) 30 MG 24 hr tablet Take 1 tablet (30 mg total) by mouth daily. 30 tablet 1     Hospital Medications: Current Facility-Administered Medications  Medication Dose Route Frequency Provider Last Rate Last Admin   lactated ringers 1,000 mL with potassium chloride 40 mEq infusion   Intravenous Continuous Wood Bing, MD       metoprolol tartrate (LOPRESSOR) tablet 25 mg  25 mg Oral BID Levie Heritage, DO         Physical Exam:  Current Vital Signs 24h Vital Sign Ranges  T 98.9 F (37.2 C) Temp  Avg: 98.5 F (36.9 C)  Min: 98.1 F (36.7 C)  Max: 98.9 F (37.2 C)  BP 113/80 BP  Min: 113/80  Max: 136/79  HR 91 Pulse  Avg: 108.2  Min: 91  Max: 142  RR 19 Resp  Avg: 16.8  Min: 14  Max: 19  SaO2 100 % Room Air SpO2  Avg: 99.6 %  Min: 99 %  Max: 100 %       24 Hour I/O Current Shift I/O  Time Ins Outs No intake/output data recorded. No intake/output data recorded.   Patient Vitals for the past 24 hrs:  BP Temp Temp src Pulse Resp SpO2 Height  12/24/20 2044 -- 98.9 F (37.2 C) Oral -- -- -- --  12/24/20 2025 113/80 -- -- 91 19 100 % --  12/24/20 2020 118/86 -- -- 91 16 100 % --  12/24/20 2000 119/86 -- -- (!) 107 14 100 % --  12/24/20 1945 136/79 -- -- (!) 110 18 99 % --  12/24/20 1912 132/85 98.1 F (36.7 C) Oral (!) 142 -- 99 % --  12/24/20 1910 -- -- -- -- -- -- 5\' 8"  (1.727 m)    Body mass index is 19.58 kg/m. General appearance: Well nourished, well developed female in no acute distress.  Cardiovascular: S1, S2 normal, no murmur, rub or gallop, regular rate  and rhythm Respiratory:  Clear to auscultation bilateral. Normal respiratory effort Abdomen: positive  bowel sounds and no masses, hernias; diffusely non tender to palpation, non distended Neuro/Psych:  Normal mood and affect.  Skin:  Warm and dry.  Extremities: no clubbing, cyanosis, or edema.  Lymphatic:  No inguinal lymphadenopathy.   Pelvic exam: is not limited by body habitus EGBUS: within normal limits Vagina: small blood clot (approxx 15mL) in vault Cervix: normal, no bleeding Uterus: nttp  Laboratory: Beta HCG: 615  Recent Labs  Lab 12/24/20 1938  WBC 6.9  HGB 11.3*  HCT 35.4*  PLT 315   Recent Labs  Lab 12/24/20 1938  NA 138  K 3.3*  CL 110  CO2 23  BUN 8  CREATININE 0.42*  CALCIUM 8.0*  PROT 6.0*  BILITOT 0.7  ALKPHOS 138*  ALT 13  AST 17  GLUCOSE 101*   No results for input(s): APTT, INR, PTT in the last 168 hours.  Invalid input(s): DRHAPTT Recent Labs  Lab 12/24/20 1939  ABORH O POS    Imaging:  Narrative & Impression  CLINICAL DATA:  Partial molar pregnancy with retained products of conception. TAB 2 weeks ago. Beta hcg 615.   EXAM: TRANSABDOMINAL AND TRANSVAGINAL ULTRASOUND OF PELVIS   TECHNIQUE: Both transabdominal and transvaginal ultrasound examinations of the pelvis were performed. Transabdominal technique was performed for global imaging of the pelvis including uterus, ovaries, adnexal regions, and pelvic cul-de-sac. It was necessary to proceed with endovaginal exam following the transabdominal exam to visualize the endometrium.   COMPARISON:  None   FINDINGS: Uterus   Measurements: 9.3 x 6 x 6.9 cm = volume: 203 mL. No fibroids or other mass visualized.   Endometrium   Thickness: 16 mm.  Heterogeneous and hypervascular.   Right ovary   Measurements: 5.4 x 4.2 x 3.9 cm = volume: 46 mL. Multiple cysts noted largest measuring 4 x 3 x 3.7 cm.   Left ovary   Measurements: 4.2 x 2.6 x 3 cm = volume: 17 mL. Normal appearance/no adnexal mass.   Other findings   No abnormal free fluid.   IMPRESSION: 1.  Hypervascular heterogeneous thickened endometrium measuring up to 16 mm. Finding likely represents retained products of conception. Differential diagnosis includes gestational trophoblastic neoplasm versus endometritis. 2. Right simple ovarian cysts measuring up to 4 cm. Theca lutein cysts not excluded. Recommend short-term (4-6 weeks) repeat ultrasound for evaluation of resolution.     Electronically Signed   By: Tish FredericksonMorgane  Naveau M.D.   On: 12/24/2020 22:34    Assessment: Ms. Manson Passeyngland is a 25 y.o. [id number] (Patient's last menstrual period was 12/17/2020.) with rPOCs in the setting of molar pregnancy and prior pre-eclampsia; pt stable  Plan: Even though she is stable to go home, I don't recommend her waiting until next week to have the procedure done and I recommend admit overnight and plan for suction d&c in AM when u/s guidance and more staff is available, given increased risk of bleeding, need for blood transfusion, risk of need for additional surgery such as hysterectomy; pt is amenable to this I don't see genetics done on the prior pathology so will send this path for cytogenetics.  Patient posted for 10am on 8/27 *Thyroid: continue MMZ and lopressor *FEN/GI: NPO, MIVF with KCL *PPx: SCDs, bedrest with bathroom privileges *Pain: IV PRNs *Dispo: I told her that she should be able to go home after surgery tomorrow  Total time taking care of the patient was 35 minutes, with greater  than 50% of the time spent in face to face interaction with the patient.  Cornelia Copa MD Attending Center for [facility])

## 2020-12-25 ENCOUNTER — Other Ambulatory Visit: Payer: Self-pay

## 2020-12-25 ENCOUNTER — Inpatient Hospital Stay (HOSPITAL_COMMUNITY): Payer: Medicaid Other

## 2020-12-25 ENCOUNTER — Inpatient Hospital Stay (HOSPITAL_COMMUNITY): Payer: Medicaid Other | Admitting: Certified Registered Nurse Anesthetist

## 2020-12-25 ENCOUNTER — Encounter (HOSPITAL_COMMUNITY)
Admission: AD | Disposition: A | Discharge status: Home / Self Care | Payer: Self-pay | Source: Home / Self Care | Attending: Emergency Medicine

## 2020-12-25 ENCOUNTER — Encounter (HOSPITAL_COMMUNITY): Payer: Self-pay | Admitting: Obstetrics and Gynecology

## 2020-12-25 DIAGNOSIS — Z20822 Contact with and (suspected) exposure to covid-19: Secondary | ICD-10-CM | POA: Diagnosis not present

## 2020-12-25 DIAGNOSIS — N939 Abnormal uterine and vaginal bleeding, unspecified: Secondary | ICD-10-CM | POA: Diagnosis not present

## 2020-12-25 DIAGNOSIS — Z3A17 17 weeks gestation of pregnancy: Secondary | ICD-10-CM | POA: Diagnosis not present

## 2020-12-25 DIAGNOSIS — O011 Incomplete and partial hydatidiform mole: Secondary | ICD-10-CM | POA: Diagnosis not present

## 2020-12-25 HISTORY — PX: DILATION AND EVACUATION: SHX1459

## 2020-12-25 LAB — CBC
HCT: 36.9 % (ref 36.0–46.0)
Hemoglobin: 11.4 g/dL — ABNORMAL LOW (ref 12.0–15.0)
MCH: 26.6 pg (ref 26.0–34.0)
MCHC: 30.9 g/dL (ref 30.0–36.0)
MCV: 86 fL (ref 80.0–100.0)
Platelets: 300 10*3/uL (ref 150–400)
RBC: 4.29 MIL/uL (ref 3.87–5.11)
RDW: 15 % (ref 11.5–15.5)
WBC: 7.8 10*3/uL (ref 4.0–10.5)
nRBC: 0 % (ref 0.0–0.2)

## 2020-12-25 LAB — TYPE AND SCREEN
ABO/RH(D): O POS
Antibody Screen: NEGATIVE

## 2020-12-25 LAB — T4, FREE: Free T4: 0.62 ng/dL (ref 0.61–1.12)

## 2020-12-25 SURGERY — DILATION AND EVACUATION, UTERUS
Anesthesia: General

## 2020-12-25 MED ORDER — LIDOCAINE HCL (PF) 1 % IJ SOLN
INTRAMUSCULAR | Status: AC
  Filled 2020-12-25: qty 30

## 2020-12-25 MED ORDER — ONDANSETRON HCL 4 MG/2ML IJ SOLN: 4.0000 mg | Freq: Four times a day (QID) | INTRAMUSCULAR | Status: DC | PRN

## 2020-12-25 MED ORDER — ONDANSETRON HCL 4 MG PO TABS: 4.0000 mg | ORAL_TABLET | Freq: Four times a day (QID) | ORAL | Status: DC | PRN

## 2020-12-25 MED ORDER — SODIUM CHLORIDE 0.9 % IV SOLN
100.0000 mg | Freq: Once | INTRAVENOUS | Status: AC
  Administered 2020-12-25: 100 mg via INTRAVENOUS
  Filled 2020-12-25: qty 100

## 2020-12-25 MED ORDER — ONDANSETRON HCL 4 MG/2ML IJ SOLN
INTRAMUSCULAR | Status: DC | PRN
  Administered 2020-12-25: 4 mg via INTRAVENOUS

## 2020-12-25 MED ORDER — PROPOFOL 10 MG/ML IV BOLUS
INTRAVENOUS | Status: AC
  Filled 2020-12-25: qty 20

## 2020-12-25 MED ORDER — PROPOFOL 10 MG/ML IV BOLUS
INTRAVENOUS | Status: DC | PRN
  Administered 2020-12-25: 150 mg via INTRAVENOUS

## 2020-12-25 MED ORDER — HYDROMORPHONE HCL 1 MG/ML IJ SOLN: 0.2000 mg | INTRAMUSCULAR | Status: DC | PRN

## 2020-12-25 MED ORDER — KETOROLAC TROMETHAMINE 30 MG/ML IJ SOLN
INTRAMUSCULAR | Status: DC | PRN
  Administered 2020-12-25: 30 mg via INTRAVENOUS

## 2020-12-25 MED ORDER — ORAL CARE MOUTH RINSE: 15.0000 mL | Freq: Once | OROMUCOSAL | Status: AC

## 2020-12-25 MED ORDER — LIDOCAINE 2% (20 MG/ML) 5 ML SYRINGE
INTRAMUSCULAR | Status: DC | PRN
  Administered 2020-12-25: 60 mg via INTRAVENOUS

## 2020-12-25 MED ORDER — CHLORHEXIDINE GLUCONATE 0.12 % MT SOLN
OROMUCOSAL | Status: AC
  Administered 2020-12-25: 15 mL via OROMUCOSAL
  Filled 2020-12-25: qty 15

## 2020-12-25 MED ORDER — LACTATED RINGERS IV SOLN: INTRAVENOUS | Status: DC

## 2020-12-25 MED ORDER — MIDAZOLAM HCL 2 MG/2ML IJ SOLN
INTRAMUSCULAR | Status: AC
  Filled 2020-12-25: qty 2

## 2020-12-25 MED ORDER — DEXAMETHASONE SODIUM PHOSPHATE 10 MG/ML IJ SOLN
INTRAMUSCULAR | Status: DC | PRN
  Administered 2020-12-25: 10 mg via INTRAVENOUS

## 2020-12-25 MED ORDER — 0.9 % SODIUM CHLORIDE (POUR BTL) OPTIME
TOPICAL | Status: DC | PRN
  Administered 2020-12-25: 1000 mL

## 2020-12-25 MED ORDER — AMISULPRIDE (ANTIEMETIC) 5 MG/2ML IV SOLN: 10.0000 mg | Freq: Once | INTRAVENOUS | Status: DC | PRN

## 2020-12-25 MED ORDER — CHLORHEXIDINE GLUCONATE 0.12 % MT SOLN: 15.0000 mL | Freq: Once | OROMUCOSAL | Status: AC

## 2020-12-25 MED ORDER — MIDAZOLAM HCL 2 MG/2ML IJ SOLN
INTRAMUSCULAR | Status: DC | PRN
Start: 2020-12-25 — End: 2020-12-25
  Administered 2020-12-25: 2 mg via INTRAVENOUS

## 2020-12-25 MED ORDER — METOPROLOL TARTRATE 25 MG PO TABS: 25.0000 mg | ORAL_TABLET | Freq: Two times a day (BID) | ORAL | 2 refills | Status: AC

## 2020-12-25 MED ORDER — FENTANYL CITRATE (PF) 250 MCG/5ML IJ SOLN
INTRAMUSCULAR | Status: AC
  Filled 2020-12-25: qty 5

## 2020-12-25 MED ORDER — FENTANYL CITRATE (PF) 100 MCG/2ML IJ SOLN: 25.0000 ug | INTRAMUSCULAR | Status: DC | PRN

## 2020-12-25 MED ORDER — FENTANYL CITRATE (PF) 250 MCG/5ML IJ SOLN
INTRAMUSCULAR | Status: DC | PRN
  Administered 2020-12-25 (×2): 50 ug via INTRAVENOUS

## 2020-12-25 SURGICAL SUPPLY — 22 items
CATH ROBINSON RED A/P 16FR (CATHETERS) ×2 IMPLANT
DECANTER SPIKE VIAL GLASS SM (MISCELLANEOUS) ×2 IMPLANT
FILTER UTR ASPR ASSEMBLY (MISCELLANEOUS) ×2 IMPLANT
GLOVE SURG NEOPR MICRO LF SZ7 (GLOVE) ×2 IMPLANT
GLOVE SURG UNDER POLY LF SZ7 (GLOVE) ×2 IMPLANT
GLOVE SURG UNDER POLY LF SZ7.5 (GLOVE) ×2 IMPLANT
GOWN STRL REUS W/ TWL LRG LVL3 (GOWN DISPOSABLE) ×1 IMPLANT
GOWN STRL REUS W/ TWL XL LVL3 (GOWN DISPOSABLE) ×1 IMPLANT
GOWN STRL REUS W/TWL LRG LVL3 (GOWN DISPOSABLE) ×2
GOWN STRL REUS W/TWL XL LVL3 (GOWN DISPOSABLE) ×2
HOSE CONNECTING 18IN BERKELEY (TUBING) ×2 IMPLANT
KIT BERKELEY 1ST TRI 3/8 NO TR (MISCELLANEOUS) ×2 IMPLANT
KIT BERKELEY 1ST TRIMESTER 3/8 (MISCELLANEOUS) ×2 IMPLANT
NS IRRIG 1000ML POUR BTL (IV SOLUTION) ×2 IMPLANT
PACK VAGINAL MINOR WOMEN LF (CUSTOM PROCEDURE TRAY) ×2 IMPLANT
PAD OB MATERNITY 4.3X12.25 (PERSONAL CARE ITEMS) ×2 IMPLANT
SET BERKELEY SUCTION TUBING (SUCTIONS) ×2 IMPLANT
TOWEL GREEN STERILE FF (TOWEL DISPOSABLE) ×4 IMPLANT
VACURETTE 10 RIGID CVD (CANNULA) ×2 IMPLANT
VACURETTE 7MM CVD STRL WRAP (CANNULA) IMPLANT
VACURETTE 8 RIGID CVD (CANNULA) IMPLANT
VACURETTE 9 RIGID CVD (CANNULA) IMPLANT

## 2020-12-25 NOTE — Discharge Summary (Signed)
Physician Discharge Summary   Patient ID: Deborah Evans [id number] 25 y.o. [birth year]  Admit date: 12/24/2020  Discharge date and time: 12/25/2020  1:13 PM   Admitting Physician: Haugen Bing, MD   Discharge Physician: Milas Hock, MD  Admission Diagnoses: Retained products of conception after delivery with complications [O73.1]  Discharge Diagnoses: Same  Admission Condition: stable  Discharged Condition: stable  Indication for Admission: Retained products of conception in s/o molar pregnancy  Hospital Course: [name] presented to triage with ongoing vaginal bleeding in the s/o recent delivery with molar pregnancy. She had an [place] which showed RPOC. D&C was recommended under US guidance for this to which she agreed. She was hemodynamically stable and HgB was 11.4, improved from her hospitalization/delivery.   Consults: None  Significant Diagnostic Studies: labs: HgB 11.4 and radiology: Ultrasound: 16 mm lining and findings c/f RPOC. She also had cysts on her ovary c/w theca lutein cysts in s/o molar pregnancy.   Treatments: procedures: Ultrasound guided suction D&C - see procedure note for details.   Discharge Exam: General appearance: alert, cooperative, and no distress Head: Normocephalic, without obvious abnormality, atraumatic Lungs: clear to auscultation bilaterally Heart: regular rate and rhythm Abdomen: soft, non-tender; bowel sounds normal; no masses,  no organomegaly Pelvic: cervix normal in appearance, external genitalia normal, no adnexal masses or tenderness, no cervical motion tenderness, rectovaginal septum normal, uterus normal size, shape, and consistency, and vagina normal without discharge Extremities: extremities normal, atraumatic, no cyanosis or edema  Disposition: Discharge disposition: 01-Home or Self Care       Patient Instructions:  Allergies as of 12/25/2020   No Known Allergies      Medication List     STOP taking these  medications    medroxyPROGESTERone 150 MG/ML injection Commonly known as: DEPO-PROVERA   NIFEdipine 30 MG 24 hr tablet Commonly known as: Procardia XL       TAKE these medications    ibuprofen 600 MG tablet Commonly known as: ADVIL Take 1 tablet (600 mg total) by mouth every 6 (six) hours.   methimazole 5 MG tablet Commonly known as: TAPAZOLE Take 1 tablet (5 mg total) by mouth 2 (two) times daily.   metoprolol tartrate 25 MG tablet Commonly known as: LOPRESSOR Take 1 tablet (25 mg total) by mouth 2 (two) times daily.       Activity: activity as tolerated and no driving for today, no sex for 2 weeks, and no driving while on analgesics Diet: regular diet Wound Care: none needed  Follow-up with [facility] in 1 week.  Signed: Milas Hock 12/25/2020 4:52 PM

## 2020-12-25 NOTE — Transfer of Care (Signed)
Immediate Anesthesia Transfer of Care Note  Patient: Deborah Evans  Procedure(s) Performed: SUCTION DILATATION AND EVACUATION WITH ULTRASOUND GUIDANCE  Patient Location: PACU  Anesthesia Type:General  Level of Consciousness: awake, alert  and oriented  Airway & Oxygen Therapy: Patient Spontanous Breathing  Post-op Assessment: Report given to RN and Post -op Vital signs reviewed and stable  Post vital signs: Reviewed and stable  Last Vitals:  Vitals Value Taken Time  BP    Temp 87   Pulse    Resp 12   SpO2 100     Last Pain:  Vitals:   12/25/20 1030  TempSrc: Oral  PainSc: 0-No pain         Complications: No notable events documented.

## 2020-12-25 NOTE — Op Note (Deleted)
Preop: RPOC in s/o molar pregnancy Postop: Same Op: Dilation and evacuation Surgeon: Dr. Latera Mclin Assist: None Anesthesia: GETA EBL 25 cc IVF: 500 cc  UOP: 50 cc at the start of the case Complications: None  Findings: Normal anteverted uterus, sounded to 10 cm, products of conception noted  Description of the procedure: Preop antibiotics of doxycycline given. Informed consent reviewed and signed. Pt given opportunity to ask questions.   Pt prepped and draped in the dorsal lithotomy fashion after LMA anesthesia found to be adequate. Timeout performed.   Open-sided speculum placed into the vagina. Cervix grasped with a single tooth tenaculum. Cervix was already dilated. Ultrasound present for the procedure and US guidance used through the procedure. I used a 10 mm rigid curette. Several passes done with the suction curette and the tissue was retrieved. A gentle pass was done with a 1 curette. A gritty texture noted in all areas of the uterus confirming complete evacuation. Minimal bleeding noted. US at the end of the procedure showeded normal EL. Procedure completed. All instruments removed. Counts correct x2.   Products of conception sent to pathology.  Anora  genetics done. Rh Positive - rhogam not indicated  Pt taken to recovery room in stable condition. Plan will be for discharge home following recovery from the procedure.   

## 2020-12-25 NOTE — Procedures (Signed)
Preop: RPOC in s/o molar pregnancy Postop: Same Op: Dilation and evacuation Surgeon: Dr. Para March Assist: None Anesthesia: GETA EBL 25 cc IVF: 500 cc  UOP: 50 cc at the start of the case Complications: None  Findings: Normal anteverted uterus, sounded to 10 cm, products of conception noted  Description of the procedure: Preop antibiotics of doxycycline given. Informed consent reviewed and signed. Pt given opportunity to ask questions.   Pt prepped and draped in the dorsal lithotomy fashion after LMA anesthesia found to be adequate. Timeout performed.   Open-sided speculum placed into the vagina. Cervix grasped with a single tooth tenaculum. Cervix was already dilated. Ultrasound present for the procedure and US guidance used through the procedure. I used a 10 mm rigid curette. Several passes done with the suction curette and the tissue was retrieved. A gentle pass was done with a 1 curette. A gritty texture noted in all areas of the uterus confirming complete evacuation. Minimal bleeding noted. [place] at the end of the procedure showeded normal EL. Procedure completed. All instruments removed. Counts correct x2.   Products of conception sent to pathology.  Anora  genetics done. Rh Positive - rhogam not indicated  Pt taken to recovery room in stable condition. Plan will be for discharge home following recovery from the procedure.

## 2020-12-25 NOTE — Progress Notes (Signed)
Gynecology Progress Note  Admission Date: 12/24/2020 Current Date: 12/25/2020 8:51 AM  Deborah Evans is a 25 y.o. [id number] HD#2 admitted for rPOCS s/p 8/13 16wk SVD due to IOL for severe pre-eclampsia   History complicated by: Patient Active Problem List   Diagnosis Date Noted   Retained products of conception after delivery with complications 12/24/2020   Molar pregnancy 12/22/2020   Protein-calorie malnutrition, severe 12/09/2020   Hyperemesis complicating pregnancy, antepartum 12/07/2020   Hyperthyroidism in pregnancy, antepartum 12/06/2020   Transient hypertension of pregnancy in second trimester 11/27/2020   Elevated LFTs    Hyperemesis affecting pregnancy, antepartum 12/03/2017    ROS and patient/family/surgical history, located on admission H&P note dated 12/24/2020, have been reviewed, and there are no changes except as noted below Yesterday/Overnight Events:  None; pt slept  Subjective:  Scant to minimal bleeding, no pain   Objective:    Current Vital Signs 24h Vital Sign Ranges  T 98.6 F (37 C) Temp  Avg: 98.5 F (36.9 C)  Min: 98.1 F (36.7 C)  Max: 98.9 F (37.2 C)  BP 119/74 BP  Min: 113/80  Max: 136/79  HR 93 Pulse  Avg: 105.7  Min: 91  Max: 142  RR 18 Resp  Avg: 17  Min: 14  Max: 19  SaO2 100 % Room Air SpO2  Avg: 99.6 %  Min: 99 %  Max: 100 %       24 Hour I/O Current Shift I/O  Time Ins Outs No intake/output data recorded. No intake/output data recorded.    Patient Vitals for the past 24 hrs:  BP Temp Temp src Pulse Resp SpO2 Height  12/25/20 0612 119/74 98.6 F (37 C) Oral 93 18 -- --  12/24/20 2044 -- 98.9 F (37.2 C) Oral -- -- -- --  12/24/20 2025 113/80 -- -- 91 19 100 % --  12/24/20 2020 118/86 -- -- 91 16 100 % --  12/24/20 2000 119/86 -- -- (!) 107 14 100 % --  12/24/20 1945 136/79 -- -- (!) 110 18 99 % --  12/24/20 1912 132/85 98.1 F (36.7 C) Oral (!) 142 -- 99 % --  12/24/20 1910 -- -- -- -- -- -- 5\' 8"  (1.727 m)    Physical  exam: General appearance: alert, cooperative, and appears stated age Abdomen: soft, nttp, nd GU: No gross VB Lungs: clear to auscultation bilaterally Heart: S1, S2 normal, no murmur, rub or gallop, regular rate and rhythm Extremities: no lower extremity edema Skin: warm and dry Psych: appropriate Neurologic: Grossly normal  Medications Current Facility-Administered Medications  Medication Dose Route Frequency Provider Last Rate Last Admin   doxycycline (VIBRAMYCIN) 100 mg in sodium chloride 0.9 % 250 mL IVPB  100 mg Intravenous Once , MD       HYDROmorphone (DILAUDID) injection 0.2-0.6 mg  0.2-0.6 mg Intravenous Q2H PRN [place] Bing, MD       lactated ringers 1,000 mL with potassium chloride 40 mEq infusion   Intravenous Continuous [place] Bing, MD 100 mL/hr at 12/25/20 0057 New Bag at 12/25/20 0057   methimazole (TAPAZOLE) tablet 5 mg  5 mg Oral BID 12/27/20, MD   5 mg at 12/25/20 0058   metoprolol tartrate (LOPRESSOR) tablet 25 mg  25 mg Oral BID 12/27/20, MD       ondansetron (ZOFRAN) tablet 4 mg  4 mg Oral Q6H PRN [place] Bing, MD       Or   ondansetron [facility]) injection 4 mg  4 mg Intravenous Q6H PRN [place] Bing, MD          Labs  Recent Labs  Lab 12/24/20 1938 12/25/20 0042  WBC 6.9 7.8  HGB 11.3* 11.4*  HCT 35.4* 36.9  PLT 315 300    Recent Labs  Lab 12/24/20 1938  NA 138  K 3.3*  CL 110  CO2 23  BUN 8  CREATININE 0.42*  CALCIUM 8.0*  PROT 6.0*  BILITOT 0.7  ALKPHOS 138*  ALT 13  AST 17  GLUCOSE 101*    Radiology No new imaging  Narrative & Impression  CLINICAL DATA:  Partial molar pregnancy with retained products of conception. TAB 2 weeks ago. Beta hcg 615.   EXAM: TRANSABDOMINAL AND TRANSVAGINAL ULTRASOUND OF PELVIS   TECHNIQUE: Both transabdominal and transvaginal ultrasound examinations of the pelvis were performed. Transabdominal technique was performed for global imaging of the pelvis  including uterus, ovaries, adnexal regions, and pelvic cul-de-sac. It was necessary to proceed with endovaginal exam following the transabdominal exam to visualize the endometrium.   COMPARISON:  None   FINDINGS: Uterus   Measurements: 9.3 x 6 x 6.9 cm = volume: 203 mL. No fibroids or other mass visualized.   Endometrium   Thickness: 16 mm.  Heterogeneous and hypervascular.   Right ovary   Measurements: 5.4 x 4.2 x 3.9 cm = volume: 46 mL. Multiple cysts noted largest measuring 4 x 3 x 3.7 cm.   Left ovary   Measurements: 4.2 x 2.6 x 3 cm = volume: 17 mL. Normal appearance/no adnexal mass.   Other findings   No abnormal free fluid.   IMPRESSION: 1. Hypervascular heterogeneous thickened endometrium measuring up to 16 mm. Finding likely represents retained products of conception. Differential diagnosis includes gestational trophoblastic neoplasm versus endometritis. 2. Right simple ovarian cysts measuring up to 4 cm. Theca lutein cysts not excluded. Recommend short-term (4-6 weeks) repeat ultrasound for evaluation of resolution.     Electronically Signed   By: Tish Frederickson M.D.   On: 12/24/2020 22:34    Assessment & Plan:  Pt stable  *GYN: pt to OR at 10am for u/s guided suction d&c. I d/w her that my partner Dr. Para March will be doing the procedure and introduce herself to her. I d/w her that she should be able to go home after surgery.   Code Status: Full Code  Total time taking care of the patient was 15 minutes, with greater than 50% of the time spent in face to face interaction with the patient.  Cornelia Copa MD Attending Center for [facility]) GYN Consult Phone: [phone] (M-F, 0800-1700) & [phone] (Off hours, weekends, holidays)

## 2020-12-25 NOTE — Anesthesia Postprocedure Evaluation (Signed)
Anesthesia Post Note  Patient: Deborah Evans  Procedure(s) Performed: SUCTION DILATATION AND EVACUATION WITH ULTRASOUND GUIDANCE     Patient location during evaluation: PACU Anesthesia Type: General Level of consciousness: awake and alert Pain management: pain level controlled Vital Signs Assessment: post-procedure vital signs reviewed and stable Respiratory status: spontaneous breathing, nonlabored ventilation, respiratory function stable and patient connected to nasal cannula oxygen Cardiovascular status: blood pressure returned to baseline and stable Postop Assessment: no apparent nausea or vomiting Anesthetic complications: no   No notable events documented.  Last Vitals:  Vitals:   12/25/20 1136 12/25/20 1151  BP: 108/77 116/85  Pulse: 86 80  Resp: 20 15  Temp:  (!) 36.3 C  SpO2: 100% 100%    Last Pain:  Vitals:   12/25/20 1151  TempSrc:   PainSc: 0-No pain                 Kennieth Rad

## 2020-12-25 NOTE — Anesthesia Preprocedure Evaluation (Addendum)
Anesthesia Evaluation  Patient identified by MRN, date of birth, ID band Patient awake    Reviewed: Allergy & Precautions, NPO status , Patient's Chart, lab work & pertinent test results  Airway Mallampati: II  TM Distance: >3 FB Neck ROM: Full    Dental no notable dental hx. (+) Teeth Intact   Pulmonary neg pulmonary ROS,    Pulmonary exam normal breath sounds clear to auscultation       Cardiovascular hypertension, Normal cardiovascular exam Rhythm:Regular Rate:Normal     Neuro/Psych negative neurological ROS     GI/Hepatic negative GI ROS, Neg liver ROS,        Endo/Other  Hyperthyroidism   Renal/GU negative Renal ROS     Musculoskeletal   Abdominal   Peds  Hematology negative hematology ROS (+)   Anesthesia Other Findings   Reproductive/Obstetrics                            Lab Results  Component Value Date   WBC 7.8 12/25/2020   HGB 11.4 (L) 12/25/2020   HCT 36.9 12/25/2020   MCV 86.0 12/25/2020   PLT 300 12/25/2020   Lab Results  Component Value Date   CREATININE 0.42 (L) 12/24/2020   BUN 8 12/24/2020   NA 138 12/24/2020   K 3.3 (L) 12/24/2020   CL 110 12/24/2020   CO2 23 12/24/2020    Anesthesia Physical  Anesthesia Plan  ASA: 3  Anesthesia Plan: General   Post-op Pain Management:    Induction: Intravenous and Rapid sequence  PONV Risk Score and Plan: 3 and Midazolam, Dexamethasone, Ondansetron and Treatment may vary due to age or medical condition  Airway Management Planned: Oral ETT  Additional Equipment: None  Intra-op Plan:   Post-operative Plan: Extubation in OR  Informed Consent: I have reviewed the patients History and Physical, chart, labs and discussed the procedure including the risks, benefits and alternatives for the proposed anesthesia with the patient or authorized representative who has indicated his/her understanding and acceptance.      Dental advisory given  Plan Discussed with:   Anesthesia Plan Comments:         Anesthesia Quick Evaluation

## 2020-12-25 NOTE — Anesthesia Procedure Notes (Signed)
Procedure Name: Intubation Date/Time: 12/25/2020 10:52 AM Performed by: Lelon Perla, CRNA Pre-anesthesia Checklist: Patient identified, Emergency Drugs available, Suction available and Patient being monitored Patient Re-evaluated:Patient Re-evaluated prior to induction Oxygen Delivery Method: Circle system utilized Preoxygenation: Pre-oxygenation with 100% oxygen Induction Type: IV induction, Cricoid Pressure applied and Rapid sequence Tube type: Oral Tube size: 7.0 mm Number of attempts: 1 Airway Equipment and Method: Stylet and Oral airway Placement Confirmation: ETT inserted through vocal cords under direct vision, positive ETCO2 and breath sounds checked- equal and bilateral Secured at: 21 cm Tube secured with: Tape Dental Injury: Teeth and Oropharynx as per pre-operative assessment

## 2020-12-26 ENCOUNTER — Encounter (HOSPITAL_COMMUNITY): Payer: Self-pay | Admitting: Obstetrics and Gynecology

## 2020-12-27 ENCOUNTER — Telehealth: Payer: Self-pay | Admitting: *Deleted

## 2020-12-27 LAB — T3, FREE: T3, Free: 2.1 pg/mL (ref 2.0–4.4)

## 2020-12-27 NOTE — Telephone Encounter (Signed)
Transition Care Management Unsuccessful Follow-up Telephone Call  Date of discharge and from where:  12/25/2020 [facility]  Attempts:  1st Attempt  Reason for unsuccessful TCM follow-up call:  Left voice message

## 2020-12-28 LAB — SURGICAL PATHOLOGY

## 2020-12-28 NOTE — Telephone Encounter (Signed)
Transition Care Management Unsuccessful Follow-up Telephone Call  Date of discharge and from where:  12/25/2020 [facility]  Attempts:  2nd Attempt  Reason for unsuccessful TCM follow-up call:  Left voice message

## 2020-12-29 NOTE — Telephone Encounter (Signed)
Transition Care Management Follow-up Telephone Call Date of discharge and from where: 12/25/2020 [facility] [place]  How have you been since you were released from the hospital? "Doing okay" Any questions or concerns? No  Items Reviewed: Did the pt receive and understand the discharge instructions provided? Yes  Medications obtained and verified? Yes  Other? No  Any new allergies since your discharge? No  Dietary orders reviewed? No Do you have support at home? Yes    Functional Questionnaire: (I = Independent and D = Dependent) ADLs: I  Bathing/Dressing- I  Meal Prep- I  Eating- I  Maintaining continence- I  Transferring/Ambulation- I  Managing Meds- I  Follow up appointments reviewed:  PCP Hospital f/u appt confirmed? No   Specialist Hospital f/u appt confirmed? Yes  Scheduled to see OBGYN on 01/19/2021 @ 1530. Are transportation arrangements needed? No  If their condition worsens, is the pt aware to call PCP or go to the Emergency Dept.? Yes Was the patient provided with contact information for the PCP's office or ED? Yes Was to pt encouraged to call back with questions or concerns? Yes

## 2021-01-03 ENCOUNTER — Other Ambulatory Visit: Payer: Self-pay | Admitting: Obstetrics and Gynecology

## 2021-01-03 ENCOUNTER — Other Ambulatory Visit: Payer: Self-pay | Admitting: Obstetrics & Gynecology

## 2021-01-11 ENCOUNTER — Ambulatory Visit: Payer: Medicaid Other | Admitting: Adult Health

## 2021-01-15 ENCOUNTER — Other Ambulatory Visit (INDEPENDENT_AMBULATORY_CARE_PROVIDER_SITE_OTHER): Payer: Self-pay

## 2021-01-19 ENCOUNTER — Ambulatory Visit: Payer: Medicaid Other | Admitting: Advanced Practice Midwife

## 2021-01-20 ENCOUNTER — Ambulatory Visit: Payer: Medicaid Other | Admitting: Family Medicine

## 2021-01-25 ENCOUNTER — Ambulatory Visit: Payer: Medicaid Other | Admitting: Obstetrics & Gynecology

## 2021-01-25 ENCOUNTER — Encounter: Payer: Self-pay | Admitting: Obstetrics & Gynecology

## 2021-01-25 ENCOUNTER — Other Ambulatory Visit: Payer: Self-pay

## 2021-01-25 VITALS — BP 125/80 | HR 114 | Ht 68.0 in | Wt 141.4 lb

## 2021-01-25 DIAGNOSIS — O020 Blighted ovum and nonhydatidiform mole: Secondary | ICD-10-CM | POA: Diagnosis not present

## 2021-01-25 NOTE — Progress Notes (Signed)
  HPI: Patient returns for routine postoperative follow-up having undergone D&C on 12/25/20 for PP bleeding associated with molar pregnancy.  The patient's immediate postoperative recovery has been unremarkable. Since hospital discharge the patient reports no bleeding no problems.   Current Outpatient Medications: metoprolol tartrate (LOPRESSOR) 25 MG tablet, Take 1 tablet (25 mg total) by mouth 2 (two) times daily., Disp: 30 tablet, Rfl: 2 ibuprofen (ADVIL) 600 MG tablet, Take 1 tablet (600 mg total) by mouth every 6 (six) hours. (Patient not taking: Reported on 01/25/2021), Disp: 30 tablet, Rfl: 0 methimazole (TAPAZOLE) 5 MG tablet, TAKE 1 TABLET BY MOUTH TWICE A DAY (Patient not taking: Reported on 01/25/2021), Disp: 60 tablet, Rfl: 1 NIFEdipine (PROCARDIA-XL/NIFEDICAL-XL) 30 MG 24 hr tablet, TAKE 1 TABLET BY MOUTH EVERY DAY, Disp: 30 tablet, Rfl: 1  No current facility-administered medications for this visit.    Blood pressure 125/80, pulse (!) 114, height 5\' 8"  (1.727 m), weight 141 lb 6.4 oz (64.1 kg), last menstrual period 12/17/2020.  Physical Exam: Gen WDWN NAD  Diagnostic Tests: Quantitative HCG today  Pathology: Reviewed INSF  Impression: 1. Molar pregnancy   Beta hCG quant (ref lab)     Plan: Follow HCG to 0 then monthly for 1 year Pt wants to go ahead and do BTL will schedule when HCG is 0    Follow up: prn   12/19/2020, MD

## 2021-01-26 LAB — BETA HCG QUANT (REF LAB): hCG Quant: 13 m[IU]/mL

## 2021-03-16 ENCOUNTER — Ambulatory Visit: Payer: Medicaid Other | Admitting: Obstetrics & Gynecology

## 2021-03-16 ENCOUNTER — Encounter: Payer: Self-pay | Admitting: Obstetrics & Gynecology

## 2021-03-16 ENCOUNTER — Other Ambulatory Visit: Payer: Self-pay

## 2021-03-16 VITALS — BP 119/80 | HR 95 | Ht 67.0 in | Wt 163.0 lb

## 2021-03-16 DIAGNOSIS — Z3009 Encounter for other general counseling and advice on contraception: Secondary | ICD-10-CM | POA: Diagnosis not present

## 2021-03-16 DIAGNOSIS — O020 Blighted ovum and nonhydatidiform mole: Secondary | ICD-10-CM | POA: Diagnosis not present

## 2021-03-16 NOTE — Progress Notes (Signed)
Chief Complaint  Patient presents with   Recurrent Miscarriage      25 y.o. [id number] Patient's last menstrual period was 12/17/2020. The current method of family planning is Depo-Provera injections.  Outpatient Encounter Medications as of 03/16/2021  Medication Sig   ibuprofen (ADVIL) 600 MG tablet Take 1 tablet (600 mg total) by mouth every 6 (six) hours.   medroxyPROGESTERone (DEPO-PROVERA) 150 MG/ML injection Inject 150 mg into the muscle every 3 (three) months.   methimazole (TAPAZOLE) 5 MG tablet TAKE 1 TABLET BY MOUTH TWICE A DAY   metoprolol tartrate (LOPRESSOR) 25 MG tablet Take 1 tablet (25 mg total) by mouth 2 (two) times daily.   [DISCONTINUED] NIFEdipine (PROCARDIA-XL/NIFEDICAL-XL) 30 MG 24 hr tablet TAKE 1 TABLET BY MOUTH EVERY DAY   No facility-administered encounter medications on file as of 03/16/2021.    Subjective Pt on depo provera, wants definitive surgical permanent sterilization Due to her difficult pregnancies Past Medical History:  Diagnosis Date   Hyperemesis gravidarum    Hypertension    Hyperthyroidism    Miscarriage    12/13/20    Past Surgical History:  Procedure Laterality Date   CHOLECYSTECTOMY N/A 11/25/2020   Procedure: LAPAROSCOPIC CHOLECYSTECTOMY;  Surgeon: Harriette Bouillon, MD;  Location: [facility] OR;  Service: General;  Laterality: N/A;   DILATION AND CURETTAGE OF UTERUS N/A 07/15/2014   Procedure: SUCTION DILATATION AND CURETTAGE;  Surgeon: Lazaro Arms, MD;  Location: [facility];  Service: Gynecology;  Laterality: N/A;   DILATION AND EVACUATION N/A 12/25/2020   Procedure: SUCTION DILATATION AND EVACUATION WITH ULTRASOUND GUIDANCE;  Surgeon: Milas Hock, MD;  Location: [facility];  Service: Gynecology;  Laterality: N/A;    OB History     Gravida  4   Para  1   Term  1   Preterm      AB  2   Living  1      SAB  1   IAB  1   Ectopic      Multiple  0   Live Births  1           No Known Allergies  Social History    Socioeconomic History   Marital status: Single    Spouse name: Not on file   Number of children: Not on file   Years of education: Not on file   Highest education level: Not on file  Occupational History   Occupation: none  Tobacco Use   Smoking status: Never   Smokeless tobacco: Never  Vaping Use   Vaping Use: Never used  Substance and Sexual Activity   Alcohol use: No   Drug use: No   Sexual activity: Not Currently    Birth control/protection: Injection  Other Topics Concern   Not on file  Social History Narrative   Not on file   Social Determinants of Health   Financial Resource Strain: Not on file  Food Insecurity: Not on file  Transportation Needs: Not on file  Physical Activity: Not on file  Stress: Not on file  Social Connections: Not on file    Family History  Problem Relation Age of Onset   Healthy Mother    Aneurysm Father    Hypertension Maternal Grandmother    ADD / ADHD Neg Hx    Alcohol abuse Neg Hx    Anxiety disorder Neg Hx    Arthritis Neg Hx    Birth defects Neg Hx    Asthma Neg Hx  Cancer Neg Hx    COPD Neg Hx    Depression Neg Hx    Diabetes Neg Hx    Drug abuse Neg Hx    Early death Neg Hx    Hearing loss Neg Hx    Heart disease Neg Hx    Hyperlipidemia Neg Hx    Intellectual disability Neg Hx    Kidney disease Neg Hx    Learning disabilities Neg Hx    Miscarriages / Stillbirths Neg Hx    Obesity Neg Hx    Stroke Neg Hx    Vision loss Neg Hx    Varicose Veins Neg Hx     Medications:       Current Outpatient Medications:    ibuprofen (ADVIL) 600 MG tablet, Take 1 tablet (600 mg total) by mouth every 6 (six) hours., Disp: 30 tablet, Rfl: 0   medroxyPROGESTERone (DEPO-PROVERA) 150 MG/ML injection, Inject 150 mg into the muscle every 3 (three) months., Disp: , Rfl:    methimazole (TAPAZOLE) 5 MG tablet, TAKE 1 TABLET BY MOUTH TWICE A DAY, Disp: 60 tablet, Rfl: 1   metoprolol tartrate (LOPRESSOR) 25 MG tablet, Take 1 tablet (25  mg total) by mouth 2 (two) times daily., Disp: 30 tablet, Rfl: 2  Objective Blood pressure 119/80, pulse 95, height 5\' 7"  (1.702 m), weight 163 lb (73.9 kg), last menstrual period 12/17/2020, unknown if currently breastfeeding.  Gen WDWN NAD  Pertinent ROS No burning with urination, frequency or urgency No nausea, vomiting or diarrhea Nor fever chills or other constitutional symptoms'  Labs or studies     Impression Diagnoses this Encounter::   ICD-10-CM   1. Encounter for consultation for female sterilization  Z30.09     2. Molar pregnancy  O02.0       Established relevant diagnosis(es):   Plan/Recommendations: No orders of the defined types were placed in this encounter.   Labs or Scans Ordered: No orders of the defined types were placed in this encounter.   Management:: Laparoscopic salpingectomy 03/30/21 Follow up Return in about 23 days (around 04/08/2021) for 14/12/2020 visit, Post Op, with Dr [employer].     All questions were answered.

## 2021-03-21 ENCOUNTER — Encounter: Payer: Self-pay | Admitting: Obstetrics & Gynecology

## 2021-03-21 NOTE — Patient Instructions (Signed)
Your procedure is scheduled on: 03/30/2021  Report to [facility] Main Entrance at   9:15  AM.  Call this number if you have problems the morning of surgery: [phone]   Remember:   Do not Eat or Drink after midnight         No Smoking the morning of surgery  :  Take these medicines the morning of surgery with A SIP OF WATER: metoprolol   Do not wear jewelry, make-up or nail polish.  Do not wear lotions, powders, or perfumes. You may wear deodorant.  Do not shave 48 hours prior to surgery. Men may shave face and neck.  Do not bring valuables to the hospital.  Contacts, dentures or bridgework may not be worn into surgery.  Leave suitcase in the car. After surgery it may be brought to your room.  For patients admitted to the hospital, checkout time is 11:00 AM the day of discharge.   Patients discharged the day of surgery will not be allowed to drive home.    Special Instructions: Shower using CHG night before surgery and shower the day of surgery use CHG.  Use special wash - you have one bottle of CHG for all showers.  You should use approximately 1/2 of the bottle for each shower.  How to Use Chlorhexidine for Bathing Chlorhexidine gluconate (CHG) is a germ-killing (antiseptic) solution that is used to clean the skin. It can get rid of the bacteria that normally live on the skin and can keep them away for about 24 hours. To clean your skin with CHG, you may be given: A CHG solution to use in the shower or as part of a sponge bath. A prepackaged cloth that contains CHG. Cleaning your skin with CHG may help lower the risk for infection: While you are staying in the intensive care unit of the hospital. If you have a vascular access, such as a central line, to provide short-term or long-term access to your veins. If you have a catheter to drain urine from your bladder. If you are on a ventilator. A ventilator is a machine that helps you breathe by moving air in and out of your  lungs. After surgery. What are the risks? Risks of using CHG include: A skin reaction. Hearing loss, if CHG gets in your ears and you have a perforated eardrum. Eye injury, if CHG gets in your eyes and is not rinsed out. The CHG product catching fire. Make sure that you avoid smoking and flames after applying CHG to your skin. Do not use CHG: If you have a chlorhexidine allergy or have previously reacted to chlorhexidine. On babies younger than 55 months of age. How to use CHG solution Use CHG only as told by your health care provider, and follow the instructions on the label. Use the full amount of CHG as directed. Usually, this is one bottle. During a shower Follow these steps when using CHG solution during a shower (unless your health care provider gives you different instructions): Start the shower. Use your normal soap and shampoo to wash your face and hair. Turn off the shower or move out of the shower stream. Pour the CHG onto a clean washcloth. Do not use any type of brush or rough-edged sponge. Starting at your neck, lather your body down to your toes. Make sure you follow these instructions: If you will be having surgery, pay special attention to the part of your body where you will be having surgery. Scrub  this area for at least 1 minute. Do not use CHG on your head or face. If the solution gets into your ears or eyes, rinse them well with water. Avoid your genital area. Avoid any areas of skin that have broken skin, cuts, or scrapes. Scrub your back and under your arms. Make sure to wash skin folds. Let the lather sit on your skin for 1-2 minutes or as long as told by your health care provider. Thoroughly rinse your entire body in the shower. Make sure that all body creases and crevices are rinsed well. Dry off with a clean towel. Do not put any substances on your body afterward--such as powder, lotion, or perfume--unless you are told to do so by your health care provider.  Only use lotions that are recommended by the manufacturer. Put on clean clothes or pajamas. If it is the night before your surgery, sleep in clean sheets.  During a sponge bath Follow these steps when using CHG solution during a sponge bath (unless your health care provider gives you different instructions): Use your normal soap and shampoo to wash your face and hair. Pour the CHG onto a clean washcloth. Starting at your neck, lather your body down to your toes. Make sure you follow these instructions: If you will be having surgery, pay special attention to the part of your body where you will be having surgery. Scrub this area for at least 1 minute. Do not use CHG on your head or face. If the solution gets into your ears or eyes, rinse them well with water. Avoid your genital area. Avoid any areas of skin that have broken skin, cuts, or scrapes. Scrub your back and under your arms. Make sure to wash skin folds. Let the lather sit on your skin for 1-2 minutes or as long as told by your health care provider. Using a different clean, wet washcloth, thoroughly rinse your entire body. Make sure that all body creases and crevices are rinsed well. Dry off with a clean towel. Do not put any substances on your body afterward--such as powder, lotion, or perfume--unless you are told to do so by your health care provider. Only use lotions that are recommended by the manufacturer. Put on clean clothes or pajamas. If it is the night before your surgery, sleep in clean sheets. How to use CHG prepackaged cloths Only use CHG cloths as told by your health care provider, and follow the instructions on the label. Use the CHG cloth on clean, dry skin. Do not use the CHG cloth on your head or face unless your health care provider tells you to. When washing with the CHG cloth: Avoid your genital area. Avoid any areas of skin that have broken skin, cuts, or scrapes. Before surgery Follow these steps when using a  CHG cloth to clean before surgery (unless your health care provider gives you different instructions): Using the CHG cloth, vigorously scrub the part of your body where you will be having surgery. Scrub using a back-and-forth motion for 3 minutes. The area on your body should be completely wet with CHG when you are done scrubbing. Do not rinse. Discard the cloth and let the area air-dry. Do not put any substances on the area afterward, such as powder, lotion, or perfume. Put on clean clothes or pajamas. If it is the night before your surgery, sleep in clean sheets.  For general bathing Follow these steps when using CHG cloths for general bathing (unless your health care provider  gives you different instructions). Use a separate CHG cloth for each area of your body. Make sure you wash between any folds of skin and between your fingers and toes. Wash your body in the following order, switching to a new cloth after each step: The front of your neck, shoulders, and chest. Both of your arms, under your arms, and your hands. Your stomach and groin area, avoiding the genitals. Your right leg and foot. Your left leg and foot. The back of your neck, your back, and your buttocks. Do not rinse. Discard the cloth and let the area air-dry. Do not put any substances on your body afterward--such as powder, lotion, or perfume--unless you are told to do so by your health care provider. Only use lotions that are recommended by the manufacturer. Put on clean clothes or pajamas. Contact a health care provider if: Your skin gets irritated after scrubbing. You have questions about using your solution or cloth. You swallow any chlorhexidine. Call your local poison control center [phone] in the [place] Get help right away if: Your eyes itch badly, or they become very red or swollen. Your skin itches badly and is red or swollen. Your hearing changes. You have trouble seeing. You have swelling or tingling in  your mouth or throat. You have trouble breathing. These symptoms may represent a serious problem that is an emergency. Do not wait to see if the symptoms will go away. Get medical help right away. Call your local emergency services (911 in the [place] Do not drive yourself to the hospital. Summary Chlorhexidine gluconate (CHG) is a germ-killing (antiseptic) solution that is used to clean the skin. Cleaning your skin with CHG may help to lower your risk for infection. You may be given CHG to use for bathing. It may be in a bottle or in a prepackaged cloth to use on your skin. Carefully follow your health care provider's instructions and the instructions on the product label. Do not use CHG if you have a chlorhexidine allergy. Contact your health care provider if your skin gets irritated after scrubbing. This information is not intended to replace advice given to you by your health care provider. Make sure you discuss any questions you have with your health care provider. Document Revised: 06/28/2020 Document Reviewed: 06/28/2020 Elsevier Patient Education  2022 [employer] Salpingectomy, Care After The following information offers guidance on how to care for yourself after your procedure. Your health care provider may also give you more specific instructions. If you have problems or questions, contact your health care provider. What can I expect after the procedure? After the procedure, it is common to have: Pain in your abdomen. Light vaginal bleeding (spotting) for a few days. Tiredness. Your recovery time will depend on which method was used for your surgery. Follow these instructions at home: Medicines Take over-the-counter and prescription medicines only as told by your health care provider. Ask your health care provider if the medicine prescribed to you: Requires you to avoid driving or using machinery. Can cause constipation. You may need to take actions to prevent or treat  constipation, such as: Drink enough fluid to keep your urine pale yellow. Take over-the-counter or prescription medicines. Eat foods that are high in fiber, such as beans, whole grains, and fresh fruits and vegetables. Limit foods that are high in fat and processed sugars, such as fried or sweet foods. Incision care  Follow instructions from your health care provider about how to take care of your incision or  incisions. Make sure you: Wash your hands with soap and water for at least 20 seconds before and after you change your bandage (dressing). If soap and water are not available, use hand sanitizer. Change or remove your dressing as told by your health care provider. Leave stitches (sutures), skin glue, staples, or adhesive strips in place. These skin closures may need to stay in place for 2 weeks or longer. If adhesive strip edges start to loosen and curl up, you may trim the loose edges. Do not remove adhesive strips completely unless your health care provider tells you to do that. Keep your dressing clean and dry. Check your incision area every day for signs of infection. Check for: Redness, swelling, or pain that gets worse. Fluid or blood. Warmth. Pus or a bad smell. Activity Rest as told by your health care provider. Avoid sitting for a long time without moving. Get up to take short walks every 1-2 hours. This is important to improve blood flow and breathing. Ask for help if you feel weak or unsteady. Return to your normal activities as told by your health care provider. Ask your health care provider what activities are safe for you. Do not drive until your health care provider says that it is safe. Do not lift anything that is heavier than 10 lb (4.5 kg), or the limit that you are told, until your health care provider says that it is safe. This may last for 2-6 weeks depending on your surgery. Do not douche, use tampons, or have sex until your health care provider approves. General  instructions Do not use any products that contain nicotine or tobacco. These products include cigarettes, chewing tobacco, and vaping devices, such as e-cigarettes. These can delay healing after surgery. If you need help quitting, ask your health care provider. Wear compression stockings as told by your health care provider. These stockings help to prevent blood clots and reduce swelling in your legs. Do not take baths, swim, or use a hot tub until your health care provider approves. You may take showers. Keep all follow-up visits. This is important. Contact a health care provider if: You have pain when you urinate. You have redness, swelling, or more pain around an incision or an incision feels warm to the touch. You have pus, fluid, blood, or a bad smell coming from an incision or an incision starts to open. You have a fever. You have abdominal pain that gets worse or does not get better with medicine. You have a rash. You feel light-headed, have nausea and vomiting, or both. Get help right away if: You have pain in your chest or leg. You develop shortness of breath. You faint. You have increased or heavy vaginal bleeding, such as soaking a sanitary napkin in an hour. These symptoms may represent a serious problem that is an emergency. Do not wait to see if the symptoms will go away. Get medical help right away. Call your local emergency services (911 in the [place] Do not drive yourself to the hospital. Summary After the procedure, it is common to feel tired, have pain in your abdomen, and have light vaginal bleeding for a few days. Follow instructions from your health care provider about how to take care of your incision or incisions. Return to your normal activities as told by your health care provider. Ask your health care provider what activities are safe for you. Do not douche, use tampons, or have sex until your health care provider approves. Keep all  follow-up visits. This is  important. This information is not intended to replace advice given to you by your health care provider. Make sure you discuss any questions you have with your health care provider. Document Revised: 03/09/2020 Document Reviewed: 03/09/2020 Elsevier Patient Education  2022 [employer] General Anesthesia, Adult, Care After This sheet gives you information about how to care for yourself after your procedure. Your health care provider may also give you more specific instructions. If you have problems or questions, contact your health care provider. What can I expect after the procedure? After the procedure, the following side effects are common: Pain or discomfort at the IV site. Nausea. Vomiting. Sore throat. Trouble concentrating. Feeling cold or chills. Feeling weak or tired. Sleepiness and fatigue. Soreness and body aches. These side effects can affect parts of the body that were not involved in surgery. Follow these instructions at home: For the time period you were told by your health care provider:  Rest. Do not participate in activities where you could fall or become injured. Do not drive or use machinery. Do not drink alcohol. Do not take sleeping pills or medicines that cause drowsiness. Do not make important decisions or sign legal documents. Do not take care of children on your own. Eating and drinking Follow any instructions from your health care provider about eating or drinking restrictions. When you feel hungry, start by eating small amounts of foods that are soft and easy to digest (bland), such as toast. Gradually return to your regular diet. Drink enough fluid to keep your urine pale yellow. If you vomit, rehydrate by drinking water, juice, or clear broth. General instructions If you have sleep apnea, surgery and certain medicines can increase your risk for breathing problems. Follow instructions from your health care provider about wearing your sleep  device: Anytime you are sleeping, including during daytime naps. While taking prescription pain medicines, sleeping medicines, or medicines that make you drowsy. Have a responsible adult stay with you for the time you are told. It is important to have someone help care for you until you are awake and alert. Return to your normal activities as told by your health care provider. Ask your health care provider what activities are safe for you. Take over-the-counter and prescription medicines only as told by your health care provider. If you smoke, do not smoke without supervision. Keep all follow-up visits as told by your health care provider. This is important. Contact a health care provider if: You have nausea or vomiting that does not get better with medicine. You cannot eat or drink without vomiting. You have pain that does not get better with medicine. You are unable to pass urine. You develop a skin rash. You have a fever. You have redness around your IV site that gets worse. Get help right away if: You have difficulty breathing. You have chest pain. You have blood in your urine or stool, or you vomit blood. Summary After the procedure, it is common to have a sore throat or nausea. It is also common to feel tired. Have a responsible adult stay with you for the time you are told. It is important to have someone help care for you until you are awake and alert. When you feel hungry, start by eating small amounts of foods that are soft and easy to digest (bland), such as toast. Gradually return to your regular diet. Drink enough fluid to keep your urine pale yellow. Return to your normal activities as told by  your health care provider. Ask your health care provider what activities are safe for you. This information is not intended to replace advice given to you by your health care provider. Make sure you discuss any questions you have with your health care provider. Document Revised:  01/01/2020 Document Reviewed: 07/31/2019 Elsevier Patient Education  2022 [employer].

## 2021-03-27 ENCOUNTER — Other Ambulatory Visit: Payer: Self-pay | Admitting: Obstetrics & Gynecology

## 2021-03-27 DIAGNOSIS — Z01818 Encounter for other preprocedural examination: Secondary | ICD-10-CM

## 2021-03-28 ENCOUNTER — Encounter (HOSPITAL_COMMUNITY)
Admission: RE | Admit: 2021-03-28 | Discharge: 2021-03-28 | Disposition: A | Discharge status: Home / Self Care | Payer: Medicaid Other | Source: Ambulatory Visit | Attending: Obstetrics & Gynecology | Admitting: Obstetrics & Gynecology

## 2021-03-28 ENCOUNTER — Other Ambulatory Visit: Payer: Self-pay

## 2021-03-28 DIAGNOSIS — Z01818 Encounter for other preprocedural examination: Secondary | ICD-10-CM

## 2021-03-28 DIAGNOSIS — Z01812 Encounter for preprocedural laboratory examination: Secondary | ICD-10-CM | POA: Diagnosis not present

## 2021-03-28 LAB — COMPREHENSIVE METABOLIC PANEL
ALT: 13 U/L (ref 0–44)
AST: 21 U/L (ref 15–41)
Albumin: 4.5 g/dL (ref 3.5–5.0)
Alkaline Phosphatase: 87 U/L (ref 38–126)
Anion gap: 6 (ref 5–15)
BUN: 15 mg/dL (ref 6–20)
CO2: 21 mmol/L — ABNORMAL LOW (ref 22–32)
Calcium: 9.2 mg/dL (ref 8.9–10.3)
Chloride: 108 mmol/L (ref 98–111)
Creatinine, Ser: 0.69 mg/dL (ref 0.44–1.00)
GFR, Estimated: >60 mL/min (ref >60)
Glucose, Bld: 95 mg/dL (ref 70–99)
Potassium: 3.8 mmol/L (ref 3.5–5.1)
Sodium: 135 mmol/L (ref 135–145)
Total Bilirubin: 1.5 mg/dL — ABNORMAL HIGH (ref 0.3–1.2)
Total Protein: 8 g/dL (ref 6.5–8.1)

## 2021-03-28 LAB — CBC
HCT: 40 % (ref 36.0–46.0)
Hemoglobin: 12.7 g/dL (ref 12.0–15.0)
MCH: 24.6 pg — ABNORMAL LOW (ref 26.0–34.0)
MCHC: 31.8 g/dL (ref 30.0–36.0)
MCV: 77.5 fL — ABNORMAL LOW (ref 80.0–100.0)
Platelets: 229 10*3/uL (ref 150–400)
RBC: 5.16 MIL/uL — ABNORMAL HIGH (ref 3.87–5.11)
RDW: 16.1 % — ABNORMAL HIGH (ref 11.5–15.5)
WBC: 3.4 10*3/uL — ABNORMAL LOW (ref 4.0–10.5)
nRBC: 0 % (ref 0.0–0.2)

## 2021-03-28 LAB — URINALYSIS, ROUTINE W REFLEX MICROSCOPIC
Glucose, UA: NEGATIVE mg/dL
Hgb urine dipstick: NEGATIVE
Ketones, ur: 15 mg/dL — AB
Nitrite: NEGATIVE
Specific Gravity, Urine: >1.03 — ABNORMAL HIGH (ref 1.005–1.030)
pH: 6 (ref 5.0–8.0)

## 2021-03-28 LAB — URINALYSIS, MICROSCOPIC (REFLEX): RBC / HPF: NONE SEEN RBC/hpf (ref 0–5)

## 2021-03-28 LAB — RAPID HIV SCREEN (HIV 1/2 AB+AG)
HIV 1/2 Antibodies: NONREACTIVE
HIV-1 P24 Antigen - HIV24: NONREACTIVE

## 2021-03-28 LAB — HCG, QUANTITATIVE, PREGNANCY: hCG, Beta Chain, Quant, S: 1 m[IU]/mL (ref <5)

## 2021-03-30 ENCOUNTER — Ambulatory Visit (HOSPITAL_COMMUNITY): Payer: Medicaid Other | Admitting: Certified Registered Nurse Anesthetist

## 2021-03-30 ENCOUNTER — Encounter (HOSPITAL_COMMUNITY): Payer: Self-pay | Admitting: Obstetrics & Gynecology

## 2021-03-30 ENCOUNTER — Ambulatory Visit (HOSPITAL_COMMUNITY)
Admission: RE | Admit: 2021-03-30 | Discharge: 2021-03-30 | Disposition: A | Discharge status: Home / Self Care | Payer: Medicaid Other | Source: Ambulatory Visit | Attending: Obstetrics & Gynecology | Admitting: Obstetrics & Gynecology

## 2021-03-30 ENCOUNTER — Encounter (HOSPITAL_COMMUNITY)
Admission: RE | Disposition: A | Discharge status: Home / Self Care | Payer: Self-pay | Source: Ambulatory Visit | Attending: Obstetrics & Gynecology

## 2021-03-30 DIAGNOSIS — Z302 Encounter for sterilization: Secondary | ICD-10-CM | POA: Diagnosis not present

## 2021-03-30 HISTORY — PX: LAPAROSCOPIC BILATERAL SALPINGECTOMY: SHX5889

## 2021-03-30 SURGERY — SALPINGECTOMY, BILATERAL, LAPAROSCOPIC
Anesthesia: General | Laterality: Bilateral

## 2021-03-30 MED ORDER — ROCURONIUM BROMIDE 10 MG/ML (PF) SYRINGE
PREFILLED_SYRINGE | INTRAVENOUS | Status: DC | PRN
  Administered 2021-03-30: 50 mg via INTRAVENOUS

## 2021-03-30 MED ORDER — MIDAZOLAM HCL 2 MG/2ML IJ SOLN
INTRAMUSCULAR | Status: DC | PRN
  Administered 2021-03-30: 2 mg via INTRAVENOUS

## 2021-03-30 MED ORDER — POVIDONE-IODINE 10 % EX SWAB: 2.0000 "application " | Freq: Once | CUTANEOUS | Status: DC

## 2021-03-30 MED ORDER — SODIUM CHLORIDE 0.9 % IR SOLN
Status: DC | PRN
  Administered 2021-03-30: 1000 mL

## 2021-03-30 MED ORDER — LIDOCAINE HCL (PF) 2 % IJ SOLN
INTRAMUSCULAR | Status: AC
  Filled 2021-03-30: qty 5

## 2021-03-30 MED ORDER — KETOROLAC TROMETHAMINE 10 MG PO TABS: 10.0000 mg | ORAL_TABLET | Freq: Three times a day (TID) | ORAL | 0 refills | Status: AC | PRN

## 2021-03-30 MED ORDER — CHLORHEXIDINE GLUCONATE 0.12 % MT SOLN
15.0000 mL | Freq: Once | OROMUCOSAL | Status: AC
  Administered 2021-03-30: 15 mL via OROMUCOSAL

## 2021-03-30 MED ORDER — ROCURONIUM BROMIDE 10 MG/ML (PF) SYRINGE
PREFILLED_SYRINGE | INTRAVENOUS | Status: AC
  Filled 2021-03-30: qty 10

## 2021-03-30 MED ORDER — SUGAMMADEX SODIUM 500 MG/5ML IV SOLN
INTRAVENOUS | Status: DC | PRN
  Administered 2021-03-30: 200 mg via INTRAVENOUS

## 2021-03-30 MED ORDER — KETOROLAC TROMETHAMINE 30 MG/ML IJ SOLN
30.0000 mg | Freq: Once | INTRAMUSCULAR | Status: AC
  Administered 2021-03-30: 30 mg via INTRAVENOUS

## 2021-03-30 MED ORDER — PROPOFOL 10 MG/ML IV BOLUS
INTRAVENOUS | Status: DC | PRN
  Administered 2021-03-30: 160 mg via INTRAVENOUS

## 2021-03-30 MED ORDER — MIDAZOLAM HCL 2 MG/2ML IJ SOLN
INTRAMUSCULAR | Status: AC
  Filled 2021-03-30: qty 2

## 2021-03-30 MED ORDER — ONDANSETRON HCL 4 MG/2ML IJ SOLN
INTRAMUSCULAR | Status: AC
  Filled 2021-03-30: qty 2

## 2021-03-30 MED ORDER — PROPOFOL 10 MG/ML IV BOLUS
INTRAVENOUS | Status: AC
  Filled 2021-03-30: qty 20

## 2021-03-30 MED ORDER — BUPIVACAINE LIPOSOME 1.3 % IJ SUSP
20.0000 mL | Freq: Once | INTRAMUSCULAR | Status: DC
  Filled 2021-03-30: qty 20

## 2021-03-30 MED ORDER — KETOROLAC TROMETHAMINE 30 MG/ML IJ SOLN
INTRAMUSCULAR | Status: AC
  Filled 2021-03-30: qty 1

## 2021-03-30 MED ORDER — CEFAZOLIN SODIUM-DEXTROSE 2-4 GM/100ML-% IV SOLN
INTRAVENOUS | Status: AC
  Filled 2021-03-30: qty 100

## 2021-03-30 MED ORDER — ORAL CARE MOUTH RINSE: 15.0000 mL | Freq: Once | OROMUCOSAL | Status: AC

## 2021-03-30 MED ORDER — HYDROCODONE-ACETAMINOPHEN 5-325 MG PO TABS: 1.0000 | ORAL_TABLET | Freq: Four times a day (QID) | ORAL | 0 refills | Status: AC | PRN

## 2021-03-30 MED ORDER — BUPIVACAINE LIPOSOME 1.3 % IJ SUSP
INTRAMUSCULAR | Status: DC | PRN
  Administered 2021-03-30: 20 mL

## 2021-03-30 MED ORDER — DEXAMETHASONE SODIUM PHOSPHATE 10 MG/ML IJ SOLN
INTRAMUSCULAR | Status: DC | PRN
  Administered 2021-03-30: 10 mg via INTRAVENOUS

## 2021-03-30 MED ORDER — ONDANSETRON HCL 8 MG PO TABS: 8.0000 mg | ORAL_TABLET | Freq: Three times a day (TID) | ORAL | 0 refills | Status: DC | PRN

## 2021-03-30 MED ORDER — CHLORHEXIDINE GLUCONATE 0.12 % MT SOLN
OROMUCOSAL | Status: AC
  Filled 2021-03-30: qty 15

## 2021-03-30 MED ORDER — BUPIVACAINE LIPOSOME 1.3 % IJ SUSP
INTRAMUSCULAR | Status: AC
  Filled 2021-03-30: qty 20

## 2021-03-30 MED ORDER — FENTANYL CITRATE (PF) 100 MCG/2ML IJ SOLN
INTRAMUSCULAR | Status: DC | PRN
  Administered 2021-03-30: 100 ug via INTRAVENOUS
  Administered 2021-03-30 (×2): 50 ug via INTRAVENOUS

## 2021-03-30 MED ORDER — LACTATED RINGERS IV SOLN: INTRAVENOUS | Status: DC

## 2021-03-30 MED ORDER — DEXAMETHASONE SODIUM PHOSPHATE 10 MG/ML IJ SOLN
INTRAMUSCULAR | Status: AC
  Filled 2021-03-30: qty 1

## 2021-03-30 MED ORDER — CEFAZOLIN SODIUM-DEXTROSE 2-4 GM/100ML-% IV SOLN
2.0000 g | INTRAVENOUS | Status: AC
  Administered 2021-03-30: 2 g via INTRAVENOUS

## 2021-03-30 MED ORDER — LIDOCAINE HCL (CARDIAC) PF 100 MG/5ML IV SOSY
PREFILLED_SYRINGE | INTRAVENOUS | Status: DC | PRN
  Administered 2021-03-30: 40 mg via INTRATRACHEAL

## 2021-03-30 MED ORDER — FENTANYL CITRATE (PF) 250 MCG/5ML IJ SOLN
INTRAMUSCULAR | Status: AC
  Filled 2021-03-30: qty 5

## 2021-03-30 MED ORDER — ONDANSETRON HCL 4 MG/2ML IJ SOLN
INTRAMUSCULAR | Status: DC | PRN
  Administered 2021-03-30: 4 mg via INTRAVENOUS

## 2021-03-30 SURGICAL SUPPLY — 36 items
ADH SKN CLS APL DERMABOND .7 (GAUZE/BANDAGES/DRESSINGS) ×1
BAG HAMPER (MISCELLANEOUS) ×2 IMPLANT
BLADE SURG SZ11 CARB STEEL (BLADE) ×2 IMPLANT
CLOTH BEACON ORANGE TIMEOUT ST (SAFETY) ×2 IMPLANT
COVER LIGHT HANDLE STERIS (MISCELLANEOUS) ×4 IMPLANT
DERMABOND ADVANCED (GAUZE/BANDAGES/DRESSINGS) ×1
DERMABOND ADVANCED .7 DNX12 (GAUZE/BANDAGES/DRESSINGS) ×1 IMPLANT
ELECT REM PT RETURN 9FT ADLT (ELECTROSURGICAL) ×2
ELECTRODE REM PT RTRN 9FT ADLT (ELECTROSURGICAL) ×1 IMPLANT
GAUZE 4X4 16PLY ~~LOC~~+RFID DBL (SPONGE) ×4 IMPLANT
GLOVE ECLIPSE 8.0 STRL XLNG CF (GLOVE) ×2 IMPLANT
GLOVE SRG 8 PF TXTR STRL LF DI (GLOVE) ×1 IMPLANT
GLOVE SURG UNDER POLY LF SZ7 (GLOVE) ×6 IMPLANT
GLOVE SURG UNDER POLY LF SZ8 (GLOVE) ×2
GOWN STRL REUS W/TWL LRG LVL3 (GOWN DISPOSABLE) ×2 IMPLANT
GOWN STRL REUS W/TWL XL LVL3 (GOWN DISPOSABLE) ×2 IMPLANT
INST SET LAPROSCOPIC GYN AP (KITS) ×2 IMPLANT
KIT TURNOVER CYSTO (KITS) ×2 IMPLANT
NEEDLE HYPO 18GX1.5 BLUNT FILL (NEEDLE) ×2 IMPLANT
NEEDLE HYPO 21X1.5 SAFETY (NEEDLE) ×2 IMPLANT
NEEDLE INSUFFLATION 14GA 120MM (NEEDLE) ×2 IMPLANT
PACK PERI GYN (CUSTOM PROCEDURE TRAY) ×2 IMPLANT
PAD ARMBOARD 7.5X6 YLW CONV (MISCELLANEOUS) ×2 IMPLANT
SET BASIN LINEN APH (SET/KITS/TRAYS/PACK) ×2 IMPLANT
SET TUBE SMOKE EVAC HIGH FLOW (TUBING) ×2 IMPLANT
SHEARS HARMONIC ACE PLUS 36CM (ENDOMECHANICALS) ×2 IMPLANT
SLEEVE ENDOPATH XCEL 5M (ENDOMECHANICALS) ×2 IMPLANT
SOL ANTI FOG 6CC (MISCELLANEOUS) ×1 IMPLANT
SOLUTION ANTI FOG 6CC (MISCELLANEOUS) ×1
SUT VICRYL 0 UR6 27IN ABS (SUTURE) ×2 IMPLANT
SUT VICRYL AB 3-0 FS1 BRD 27IN (SUTURE) ×4 IMPLANT
SYR 10ML LL (SYRINGE) ×2 IMPLANT
SYR 20ML LL LF (SYRINGE) ×4 IMPLANT
TROCAR ENDO BLADELESS 11MM (ENDOMECHANICALS) ×2 IMPLANT
TROCAR XCEL NON-BLD 5MMX100MML (ENDOMECHANICALS) ×2 IMPLANT
WARMER LAPAROSCOPE (MISCELLANEOUS) ×2 IMPLANT

## 2021-03-30 NOTE — Transfer of Care (Signed)
Immediate Anesthesia Transfer of Care Note  Patient: Deborah Evans  Procedure(s) Performed: LAPAROSCOPIC BILATERAL SALPINGECTOMY (Bilateral)  Patient Location: PACU  Anesthesia Type:General  Level of Consciousness: awake  Airway & Oxygen Therapy: Patient Spontanous Breathing and Patient connected to nasal cannula oxygen  Post-op Assessment: Report given to RN and Post -op Vital signs reviewed and stable  Post vital signs: Reviewed and stable  Last Vitals:  Vitals Value Taken Time  BP 115/75   Temp    Pulse 106   Resp    SpO2 100%     Last Pain:  Vitals:   03/30/21 0830  PainSc: 0-No pain         Complications: No notable events documented.

## 2021-03-30 NOTE — Op Note (Signed)
Preoperative Diagnosis:  1.  Multiparous female desires permanent sterilization                                          2.  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  Description of Operation:  Patient was taken to the OR and placed into supine position where she underwent general anaesthesia.   She was placed in the dorsal lithotomy position and prepped and draped in the usual sterile fashion.   An incision was made cephalad to the umbilicus and dissection taken down to the rectus fascia. A Veres needle was used to insufflate the periotneal cavity. An 11 mm non bladed video laparoscope trocar was then placed under direct visualization without difficulty.   The above noted findings were observed.   Two additional 5 mm non bladed trocars were placed in the right and left lower quadrants under direct visualization without difficulty.   The Harmonic scalpel was employed and a salpingectomy of both the right and left tubes was performed.   The tubes were removed from the peritoneal cavity and sent to pathology.   There was good hemostasis bilaterally.   The fascia, peritoneum and subcutaneous tissue were closed using 0 vicryl.   All 3 skin incisions were closed using 3-0 vicryl in a subcuticular fashion.  Exparel 266 mg 20 cc was injected in the 3 incisional/trocar sites.  The patient was awakened from anaesthesia and taken to the PACU with all counts being correct x 3.   The patient received  2 gram of ancef andToradol 30 mg IV preoperatively.  Lazaro Arms 03/30/2021 10:39 AM

## 2021-03-30 NOTE — Anesthesia Procedure Notes (Signed)
Procedure Name: Intubation Date/Time: 03/30/2021 9:47 AM Performed by: Karna Dupes, CRNA Pre-anesthesia Checklist: Patient identified, Emergency Drugs available, Suction available and Patient being monitored Patient Re-evaluated:Patient Re-evaluated prior to induction Oxygen Delivery Method: Circle system utilized Preoxygenation: Pre-oxygenation with 100% oxygen Induction Type: IV induction Ventilation: Mask ventilation without difficulty Laryngoscope Size: Mac and 3 Grade View: Grade I Tube type: Oral Tube size: 7.0 mm Number of attempts: 1 Airway Equipment and Method: Stylet Placement Confirmation: ETT inserted through vocal cords under direct vision, positive ETCO2 and breath sounds checked- equal and bilateral Secured at: 21 cm Tube secured with: Tape Dental Injury: Teeth and Oropharynx as per pre-operative assessment

## 2021-03-30 NOTE — H&P (Signed)
Preoperative History and Physical  Deborah Evans is a 25 y.o. [phone] with No LMP recorded. admitted for a laparoscocpic bilateral salpingectomy for permanent sterilization.  Remarkable history of partial molar pregnancy: developed severe pre eclampsia at 16 weeks, had cytotec IOL with delviery revealing suspicion for molar pregnancy which was confirmed on pathology.  HCG has returned to normal in the meantime Pt desires permanent sterilization opts for salpingectomy  PMH:    Past Medical History:  Diagnosis Date   Hyperemesis gravidarum    Hypertension    Hyperthyroidism    Miscarriage    12/13/20    PSH:     Past Surgical History:  Procedure Laterality Date   CHOLECYSTECTOMY N/A 11/25/2020   Procedure: LAPAROSCOPIC CHOLECYSTECTOMY;  Surgeon: Erroll Luna, MD;  Location: [facility];  Service: General;  Laterality: N/A;   DILATION AND CURETTAGE OF UTERUS N/A 07/15/2014   Procedure: SUCTION DILATATION AND CURETTAGE;  Surgeon: Florian Buff, MD;  Location: [facility];  Service: Gynecology;  Laterality: N/A;   DILATION AND EVACUATION N/A 12/25/2020   Procedure: SUCTION DILATATION AND EVACUATION WITH ULTRASOUND GUIDANCE;  Surgeon: Radene Gunning, MD;  Location: [facility];  Service: Gynecology;  Laterality: N/A;    POb/GynH:      OB History     Gravida  4   Para  1   Term  1   Preterm      AB  2   Living  1      SAB  1   IAB  1   Ectopic      Multiple  0   Live Births  1           SH:   Social History   Tobacco Use   Smoking status: Never   Smokeless tobacco: Never  Vaping Use   Vaping Use: Never used  Substance Use Topics   Alcohol use: No   Drug use: No    FH:    Family History  Problem Relation Age of Onset   Healthy Mother    Aneurysm Father    Hypertension Maternal Grandmother    ADD / ADHD Neg Hx    Alcohol abuse Neg Hx    Anxiety disorder Neg Hx    Arthritis Neg Hx    Birth defects Neg Hx    Asthma Neg Hx    Cancer Neg Hx    COPD Neg Hx     Depression Neg Hx    Diabetes Neg Hx    Drug abuse Neg Hx    Early death Neg Hx    Hearing loss Neg Hx    Heart disease Neg Hx    Hyperlipidemia Neg Hx    Intellectual disability Neg Hx    Kidney disease Neg Hx    Learning disabilities Neg Hx    Miscarriages / Stillbirths Neg Hx    Obesity Neg Hx    Stroke Neg Hx    Vision loss Neg Hx    Varicose Veins Neg Hx      Allergies: No Known Allergies  Medications:       Current Facility-Administered Medications:    bupivacaine liposome (EXPAREL) 1.3 % injection 266 mg, 20 mL, Infiltration, Once, Khaleelah Yowell, Mertie Clause, MD   ceFAZolin (ANCEF) 2-4 GM/100ML-% IVPB, , , ,    ceFAZolin (ANCEF) IVPB 2g/100 mL premix, 2 g, Intravenous, On Call to OR, Florian Buff, MD   chlorhexidine (PERIDEX) 0.12 % solution, , , ,    ketorolac (  TORADOL) 30 MG/ML injection, , , ,    lactated ringers infusion, , Intravenous, Continuous, Kiel, Coralie Keens, MD, Last Rate: 10 mL/hr at 03/30/21 0842, New Bag at 03/30/21 0842   povidone-iodine 10 % swab 2 application, 2 application, Topical, Once, Florian Buff, MD  Review of Systems:   Review of Systems  Constitutional: Negative for fever, chills, weight loss, malaise/fatigue and diaphoresis.  HENT: Negative for hearing loss, ear pain, nosebleeds, congestion, sore throat, neck pain, tinnitus and ear discharge.   Eyes: Negative for blurred vision, double vision, photophobia, pain, discharge and redness.  Respiratory: Negative for cough, hemoptysis, sputum production, shortness of breath, wheezing and stridor.   Cardiovascular: Negative for chest pain, palpitations, orthopnea, claudication, leg swelling and PND.  Gastrointestinal: Positive for abdominal pain. Negative for heartburn, nausea, vomiting, diarrhea, constipation, blood in stool and melena.  Genitourinary: Negative for dysuria, urgency, frequency, hematuria and flank pain.  Musculoskeletal: Negative for myalgias, back pain, joint pain and falls.  Skin:  Negative for itching and rash.  Neurological: Negative for dizziness, tingling, tremors, sensory change, speech change, focal weakness, seizures, loss of consciousness, weakness and headaches.  Endo/Heme/Allergies: Negative for environmental allergies and polydipsia. Does not bruise/bleed easily.  Psychiatric/Behavioral: Negative for depression, suicidal ideas, hallucinations, memory loss and substance abuse. The patient is not nervous/anxious and does not have insomnia.      PHYSICAL EXAM:  Blood pressure 106/73, pulse 99, temperature 98.7 F (37.1 C), resp. rate 18, SpO2 100 %, unknown if currently breastfeeding.    Vitals reviewed. Constitutional: She is oriented to person, place, and time. She appears well-developed and well-nourished.  HENT:  Head: Normocephalic and atraumatic.  Right Ear: External ear normal.  Left Ear: External ear normal.  Nose: Nose normal.  Mouth/Throat: Oropharynx is clear and moist.  Eyes: Conjunctivae and EOM are normal. Pupils are equal, round, and reactive to light. Right eye exhibits no discharge. Left eye exhibits no discharge. No scleral icterus.  Neck: Normal range of motion. Neck supple. No tracheal deviation present. No thyromegaly present.  Cardiovascular: Normal rate, regular rhythm, normal heart sounds and intact distal pulses.  Exam reveals no gallop and no friction rub.   No murmur heard. Respiratory: Effort normal and breath sounds normal. No respiratory distress. She has no wheezes. She has no rales. She exhibits no tenderness.  GI: Soft. Bowel sounds are normal. She exhibits no distension and no mass. There is tenderness. There is no rebound and no guarding.  Genitourinary:       Vulva is normal without lesions Vagina is pink moist without discharge Cervix normal in appearance and pap is normal Uterus is normal size, contour, position, consistency, mobility, non-tender Adnexa is negative with normal sized ovaries by sonogram   Musculoskeletal: Normal range of motion. She exhibits no edema and no tenderness.  Neurological: She is alert and oriented to person, place, and time. She has normal reflexes. She displays normal reflexes. No cranial nerve deficit. She exhibits normal muscle tone. Coordination normal.  Skin: Skin is warm and dry. No rash noted. No erythema. No pallor.  Psychiatric: She has a normal mood and affect. Her behavior is normal. Judgment and thought content normal.    Labs: Results for orders placed or performed during the hospital encounter of 03/28/21 (from the past 336 hour(s))  CBC   Collection Time: 03/28/21  3:28 PM  Result Value Ref Range   WBC 3.4 (L) 4.0 - 10.5 K/uL   RBC 5.16 (H) 3.87 - 5.11 MIL/uL  Hemoglobin 12.7 12.0 - 15.0 g/dL   HCT 51.8 84.1 - 66.0 %   MCV 77.5 (L) 80.0 - 100.0 fL   MCH 24.6 (L) 26.0 - 34.0 pg   MCHC 31.8 30.0 - 36.0 g/dL   RDW 63.0 (H) 16.0 - 10.9 %   Platelets 229 150 - 400 K/uL   nRBC 0.0 0.0 - 0.2 %  Comprehensive metabolic panel   Collection Time: 03/28/21  3:28 PM  Result Value Ref Range   Sodium 135 135 - 145 mmol/L   Potassium 3.8 3.5 - 5.1 mmol/L   Chloride 108 98 - 111 mmol/L   CO2 21 (L) 22 - 32 mmol/L   Glucose, Bld 95 70 - 99 mg/dL   BUN 15 6 - 20 mg/dL   Creatinine, Ser 3.23 0.44 - 1.00 mg/dL   Calcium 9.2 8.9 - 55.7 mg/dL   Total Protein 8.0 6.5 - 8.1 g/dL   Albumin 4.5 3.5 - 5.0 g/dL   AST 21 15 - 41 U/L   ALT 13 0 - 44 U/L   Alkaline Phosphatase 87 38 - 126 U/L   Total Bilirubin 1.5 (H) 0.3 - 1.2 mg/dL   GFR, Estimated >32 >20 mL/min   Anion gap 6 5 - 15  hCG, quantitative, pregnancy   Collection Time: 03/28/21  3:28 PM  Result Value Ref Range   hCG, Beta Chain, Quant, S 1 <5 mIU/mL  Rapid HIV screen (HIV 1/2 Ab+Ag)   Collection Time: 03/28/21  3:28 PM  Result Value Ref Range   HIV-1 P24 Antigen - HIV24 NON REACTIVE NON REACTIVE   HIV 1/2 Antibodies NON REACTIVE NON REACTIVE   Interpretation (HIV Ag Ab)      A non  reactive test result means that HIV 1 or HIV 2 antibodies and HIV 1 p24 antigen were not detected in the specimen.  Urinalysis, Routine w reflex microscopic Urine, Clean Catch   Collection Time: 03/28/21  3:28 PM  Result Value Ref Range   Color, Urine YELLOW YELLOW   APPearance HAZY (A) CLEAR   Specific Gravity, Urine >1.030 (H) 1.005 - 1.030   pH 6.0 5.0 - 8.0   Glucose, UA NEGATIVE NEGATIVE mg/dL   Hgb urine dipstick NEGATIVE NEGATIVE   Bilirubin Urine SMALL (A) NEGATIVE   Ketones, ur 15 (A) NEGATIVE mg/dL   Protein, ur TRACE (A) NEGATIVE mg/dL   Nitrite NEGATIVE NEGATIVE   Leukocytes,Ua SMALL (A) NEGATIVE  Urinalysis, Microscopic (reflex)   Collection Time: 03/28/21  3:28 PM  Result Value Ref Range   RBC / HPF NONE SEEN 0 - 5 RBC/hpf   WBC, UA 21-50 0 - 5 WBC/hpf   Bacteria, UA RARE (A) NONE SEEN   Squamous Epithelial / LPF 6-10 0 - 5    EKG: Orders placed or performed during the hospital encounter of 12/24/20   ED EKG   ED EKG   EKG 12-Lead   EKG 12-Lead   EKG    Imaging Studies: No results found.    Assessment: Multiparous female desires permanent sterilization, opts for slapingectomy Hx of molar pregnancy Patient Active Problem List   Diagnosis Date Noted   Retained products of conception, postpartum    Retained products of conception after delivery with complications 12/24/2020   Molar pregnancy 12/22/2020   Protein-calorie malnutrition, severe 12/09/2020   Hyperemesis complicating pregnancy, antepartum 12/07/2020   Hyperthyroidism in pregnancy, antepartum 12/06/2020   Transient hypertension of pregnancy in second trimester 11/27/2020   Elevated LFTs  Hyperemesis affecting pregnancy, antepartum 12/03/2017    Plan: Laparoscopic bilateral salpingectomy for sterilization  Florian Buff 03/30/2021 9:25 AM

## 2021-03-30 NOTE — Anesthesia Preprocedure Evaluation (Signed)
Anesthesia Evaluation  Patient identified by MRN, date of birth, ID band Patient awake    Reviewed: Allergy & Precautions, H&P , NPO status , Patient's Chart, lab work & pertinent test results, reviewed documented beta blocker date and time   Airway Mallampati: II  TM Distance: >3 FB Neck ROM: full    Dental no notable dental hx.    Pulmonary neg pulmonary ROS,    Pulmonary exam normal breath sounds clear to auscultation       Cardiovascular Exercise Tolerance: Good hypertension, negative cardio ROS   Rhythm:regular Rate:Normal     Neuro/Psych negative neurological ROS  negative psych ROS   GI/Hepatic negative GI ROS, Neg liver ROS,   Endo/Other  Hyperthyroidism   Renal/GU negative Renal ROS  negative genitourinary   Musculoskeletal   Abdominal   Peds  Hematology negative hematology ROS (+)   Anesthesia Other Findings   Reproductive/Obstetrics negative OB ROS                             Anesthesia Physical Anesthesia Plan  ASA: 2  Anesthesia Plan: General and General ETT   Post-op Pain Management:    Induction:   PONV Risk Score and Plan: Ondansetron  Airway Management Planned:   Additional Equipment:   Intra-op Plan:   Post-operative Plan:   Informed Consent: I have reviewed the patients History and Physical, chart, labs and discussed the procedure including the risks, benefits and alternatives for the proposed anesthesia with the patient or authorized representative who has indicated his/her understanding and acceptance.     Dental Advisory Given  Plan Discussed with: CRNA  Anesthesia Plan Comments:         Anesthesia Quick Evaluation

## 2021-03-30 NOTE — Anesthesia Postprocedure Evaluation (Signed)
Anesthesia Post Note  Patient: Deborah Evans  Procedure(s) Performed: LAPAROSCOPIC BILATERAL SALPINGECTOMY (Bilateral)  Patient location during evaluation: Phase II Anesthesia Type: General Level of consciousness: awake Pain management: pain level controlled Vital Signs Assessment: post-procedure vital signs reviewed and stable Respiratory status: spontaneous breathing and respiratory function stable Cardiovascular status: blood pressure returned to baseline and stable Postop Assessment: no headache and no apparent nausea or vomiting Anesthetic complications: no Comments: Late entry   No notable events documented.   Last Vitals:  Vitals:   03/30/21 1100 03/30/21 1113  BP: 109/71 114/83  Pulse: 82 88  Resp: (!) 24 20  Temp:  36.6 C  SpO2: 100% 100%    Last Pain:  Vitals:   03/30/21 1113  TempSrc: Oral  PainSc: 0-No pain                 Windell Norfolk

## 2021-03-31 ENCOUNTER — Encounter (HOSPITAL_COMMUNITY): Payer: Self-pay | Admitting: Obstetrics & Gynecology

## 2021-03-31 LAB — SURGICAL PATHOLOGY

## 2021-04-08 ENCOUNTER — Telehealth (INDEPENDENT_AMBULATORY_CARE_PROVIDER_SITE_OTHER): Payer: Medicaid Other | Admitting: Obstetrics & Gynecology

## 2021-04-08 ENCOUNTER — Encounter: Payer: Self-pay | Admitting: Obstetrics & Gynecology

## 2021-04-08 ENCOUNTER — Other Ambulatory Visit: Payer: Self-pay

## 2021-04-08 VITALS — BP 113/73

## 2021-04-08 DIAGNOSIS — O020 Blighted ovum and nonhydatidiform mole: Secondary | ICD-10-CM

## 2021-04-08 DIAGNOSIS — Z9889 Other specified postprocedural states: Secondary | ICD-10-CM

## 2021-04-08 NOTE — Progress Notes (Signed)
MyChart video visit Pt is at home  I am in my office Total time 10 minutes   HPI: Patient returns for routine postoperative follow-up having undergone laparoscopci bilateral salpingectomy on 03/30/21.  The patient's immediate postoperative recovery has been unremarkable. Since hospital discharge the patient reports no problems.   Current Outpatient Medications: HYDROcodone-acetaminophen (NORCO/VICODIN) 5-325 MG tablet, Take 1 tablet by mouth every 6 (six) hours as needed. (Patient not taking: Reported on 04/08/2021), Disp: 15 tablet, Rfl: 0 ibuprofen (ADVIL) 200 MG tablet, Take 400 mg by mouth every 8 (eight) hours as needed for moderate pain. (Patient not taking: Reported on 04/08/2021), Disp: , Rfl:  ibuprofen (ADVIL) 600 MG tablet, Take 1 tablet (600 mg total) by mouth every 6 (six) hours. (Patient not taking: Reported on 03/29/2021), Disp: 30 tablet, Rfl: 0 ketorolac (TORADOL) 10 MG tablet, Take 1 tablet (10 mg total) by mouth every 8 (eight) hours as needed. (Patient not taking: Reported on 04/08/2021), Disp: 15 tablet, Rfl: 0 methimazole (TAPAZOLE) 5 MG tablet, TAKE 1 TABLET BY MOUTH TWICE A DAY (Patient not taking: Reported on 03/29/2021), Disp: 60 tablet, Rfl: 1 metoprolol tartrate (LOPRESSOR) 25 MG tablet, Take 1 tablet (25 mg total) by mouth 2 (two) times daily. (Patient not taking: Reported on 03/29/2021), Disp: 30 tablet, Rfl: 2 ondansetron (ZOFRAN) 8 MG tablet, Take 1 tablet (8 mg total) by mouth every 8 (eight) hours as needed for nausea. (Patient not taking: Reported on 04/08/2021), Disp: 12 tablet, Rfl: 0  No current facility-administered medications for this visit.    Blood pressure 113/73, not currently breastfeeding.  Physical Exam: Gen WDWN NAD  Diagnostic Tests:   Pathology: benign  Impression: S/P bilateral salpingectomy for terilization Hx of molar pregnancy   Plan: Routine instructions    Follow up: Continue monthly HCG for 1 year post  delivery   Lazaro Arms, MD

## 2021-04-25 ENCOUNTER — Encounter: Payer: Self-pay | Admitting: Obstetrics & Gynecology

## 2021-04-27 ENCOUNTER — Other Ambulatory Visit: Payer: Self-pay | Admitting: Obstetrics & Gynecology

## 2021-04-27 ENCOUNTER — Other Ambulatory Visit: Payer: Medicaid Other

## 2021-04-27 ENCOUNTER — Other Ambulatory Visit: Payer: Self-pay

## 2021-04-27 DIAGNOSIS — O020 Blighted ovum and nonhydatidiform mole: Secondary | ICD-10-CM | POA: Diagnosis not present

## 2021-04-28 LAB — BETA HCG QUANT (REF LAB): hCG Quant: <1 m[IU]/mL

## 2021-05-10 ENCOUNTER — Encounter: Payer: Self-pay | Admitting: Obstetrics & Gynecology

## 2021-06-03 ENCOUNTER — Emergency Department (HOSPITAL_COMMUNITY)
Admission: EM | Admit: 2021-06-03 | Discharge: 2021-06-03 | Disposition: A | Discharge status: Home / Self Care | Payer: Medicaid Other | Attending: Emergency Medicine | Admitting: Emergency Medicine

## 2021-06-03 ENCOUNTER — Other Ambulatory Visit: Payer: Self-pay

## 2021-06-03 ENCOUNTER — Encounter (HOSPITAL_COMMUNITY): Payer: Self-pay

## 2021-06-03 ENCOUNTER — Emergency Department (HOSPITAL_COMMUNITY): Payer: Medicaid Other

## 2021-06-03 DIAGNOSIS — J3489 Other specified disorders of nose and nasal sinuses: Secondary | ICD-10-CM | POA: Diagnosis not present

## 2021-06-03 DIAGNOSIS — H1031 Unspecified acute conjunctivitis, right eye: Secondary | ICD-10-CM | POA: Insufficient documentation

## 2021-06-03 DIAGNOSIS — H1033 Unspecified acute conjunctivitis, bilateral: Secondary | ICD-10-CM

## 2021-06-03 DIAGNOSIS — R220 Localized swelling, mass and lump, head: Secondary | ICD-10-CM | POA: Diagnosis not present

## 2021-06-03 DIAGNOSIS — H1032 Unspecified acute conjunctivitis, left eye: Secondary | ICD-10-CM | POA: Diagnosis not present

## 2021-06-03 DIAGNOSIS — H5711 Ocular pain, right eye: Secondary | ICD-10-CM | POA: Diagnosis present

## 2021-06-03 DIAGNOSIS — B9689 Other specified bacterial agents as the cause of diseases classified elsewhere: Secondary | ICD-10-CM | POA: Diagnosis not present

## 2021-06-03 LAB — CBC WITH DIFFERENTIAL/PLATELET
Abs Immature Granulocytes: 0.03 10*3/uL (ref 0.00–0.07)
Basophils Absolute: 0 10*3/uL (ref 0.0–0.1)
Basophils Relative: 1 %
Eosinophils Absolute: 0 10*3/uL (ref 0.0–0.5)
Eosinophils Relative: 1 %
HCT: 35.9 % — ABNORMAL LOW (ref 36.0–46.0)
Hemoglobin: 11.4 g/dL — ABNORMAL LOW (ref 12.0–15.0)
Immature Granulocytes: 1 %
Lymphocytes Relative: 33 %
Lymphs Abs: 1.6 10*3/uL (ref 0.7–4.0)
MCH: 25.4 pg — ABNORMAL LOW (ref 26.0–34.0)
MCHC: 31.8 g/dL (ref 30.0–36.0)
MCV: 80.1 fL (ref 80.0–100.0)
Monocytes Absolute: 0.4 10*3/uL (ref 0.1–1.0)
Monocytes Relative: 7 %
Neutro Abs: 2.8 10*3/uL (ref 1.7–7.7)
Neutrophils Relative %: 57 %
Platelets: 264 10*3/uL (ref 150–400)
RBC: 4.48 MIL/uL (ref 3.87–5.11)
RDW: 14.8 % (ref 11.5–15.5)
WBC: 4.8 10*3/uL (ref 4.0–10.5)
nRBC: 0 % (ref 0.0–0.2)

## 2021-06-03 LAB — BASIC METABOLIC PANEL
Anion gap: 6 (ref 5–15)
BUN: 12 mg/dL (ref 6–20)
CO2: 24 mmol/L (ref 22–32)
Calcium: 8.8 mg/dL — ABNORMAL LOW (ref 8.9–10.3)
Chloride: 106 mmol/L (ref 98–111)
Creatinine, Ser: 0.59 mg/dL (ref 0.44–1.00)
GFR, Estimated: >60 mL/min (ref >60)
Glucose, Bld: 98 mg/dL (ref 70–99)
Potassium: 3.6 mmol/L (ref 3.5–5.1)
Sodium: 136 mmol/L (ref 135–145)

## 2021-06-03 MED ORDER — TETRACAINE HCL 0.5 % OP SOLN
2.0000 [drp] | Freq: Once | OPHTHALMIC | Status: AC
  Administered 2021-06-03: 2 [drp] via OPHTHALMIC
  Filled 2021-06-03: qty 4

## 2021-06-03 MED ORDER — ERYTHROMYCIN 5 MG/GM OP OINT
TOPICAL_OINTMENT | Freq: Once | OPHTHALMIC | Status: AC
  Administered 2021-06-03: 1 via OPHTHALMIC
  Filled 2021-06-03: qty 3.5

## 2021-06-03 MED ORDER — FLUORESCEIN SODIUM 1 MG OP STRP
1.0000 | ORAL_STRIP | Freq: Once | OPHTHALMIC | Status: AC
  Administered 2021-06-03: 1 via OPHTHALMIC
  Filled 2021-06-03: qty 1

## 2021-06-03 MED ORDER — IOHEXOL 300 MG/ML  SOLN
75.0000 mL | Freq: Once | INTRAMUSCULAR | Status: AC | PRN
  Administered 2021-06-03: 75 mL via INTRAVENOUS

## 2021-06-03 NOTE — ED Notes (Signed)
Pt was unable to see any of the images with left eye. Right eye was 20/50 with some errors.

## 2021-06-03 NOTE — Discharge Instructions (Signed)
Use the erythromycin 3 times daily until cleared.  Use both eyes.  There were any of your old mascara wand's or eye make-up products.

## 2021-06-03 NOTE — ED Triage Notes (Addendum)
Pt reports eye redness and swelling x 5 days. Has gotten worse, states it burns and itches. Pt has concerns for pink eye. Conjunctiva is visibly red with drainage.

## 2021-06-03 NOTE — ED Provider Notes (Signed)
[facility] Provider Note   CSN: [id number] Arrival date & time: 06/03/21  1159     History  Chief Complaint  Patient presents with   Eye Pain    Deborah Evans is a 26 y.o. female.   Eye Pain   Patient presents with bilateral eye pain.  Reports she has been having swelling to the left eye x1 week.  The swelling has been getting worse, it spread to the right eye.  Does endorse waking up with her eyes crusted together, there is pain to the left eye with upward, rightward, downward gaze.  There is also swelling to her upper eyelid.  Has not tried anything for it, wears glasses but no contacts.  No sick contacts that she is aware of.  Denies any vision changes.  Home Medications Prior to Admission medications   Medication Sig Start Date End Date Taking? Authorizing Provider  HYDROcodone-acetaminophen (NORCO/VICODIN) 5-325 MG tablet Take 1 tablet by mouth every 6 (six) hours as needed. Patient not taking: Reported on 04/08/2021 03/30/21   Lazaro ArmsEure, Luther H, MD  ibuprofen (ADVIL) 600 MG tablet Take 1 tablet (600 mg total) by mouth every 6 (six) hours. Patient not taking: Reported on 03/29/2021 12/12/20   Lazaro ArmsEure, Luther H, MD  ketorolac (TORADOL) 10 MG tablet Take 1 tablet (10 mg total) by mouth every 8 (eight) hours as needed. Patient not taking: Reported on 04/08/2021 03/30/21   Lazaro ArmsEure, Luther H, MD  methimazole (TAPAZOLE) 5 MG tablet TAKE 1 TABLET BY MOUTH TWICE A DAY Patient not taking: Reported on 03/29/2021 01/04/21   Lazaro ArmsEure, Luther H, MD  metoprolol tartrate (LOPRESSOR) 25 MG tablet Take 1 tablet (25 mg total) by mouth 2 (two) times daily. Patient not taking: Reported on 03/29/2021 12/25/20   Milas Hockuncan, Paula, MD  ondansetron (ZOFRAN) 8 MG tablet Take 1 tablet (8 mg total) by mouth every 8 (eight) hours as needed for nausea. Patient not taking: Reported on 04/08/2021 03/30/21   Lazaro ArmsEure, Luther H, MD      Allergies    Patient has no known allergies.    Review of Systems    Review of Systems  Eyes:  Positive for pain.   Physical Exam Updated Vital Signs BP 128/83 (BP Location: Left Arm)    Pulse 85    Temp 98.1 F (36.7 C) (Oral)    Resp 17    Ht 5\' 7"  (1.702 m)    Wt 71.7 kg    LMP 05/14/2021 (Exact Date)    SpO2 100%    BMI 24.75 kg/m  Physical Exam Vitals and nursing note reviewed. Exam conducted with a chaperone present.  Constitutional:      General: She is not in acute distress.    Appearance: Normal appearance.  HENT:     Head: Normocephalic and atraumatic.  Eyes:     General: No scleral icterus.       Right eye: Discharge present.        Left eye: Discharge present.    Extraocular Movements: Extraocular movements intact.     Right eye: Normal extraocular motion.     Left eye: Abnormal extraocular motion present.     Pupils: Pupils are equal, round, and reactive to light.     Comments: (Pain with EOM to left).  Chemosis to the left eye.  Also upper eyelid edema.  Fluorescein without any abnormal uptake.  Skin:    Coloration: Skin is not jaundiced.  Neurological:     Mental Status:  She is alert. Mental status is at baseline.     Coordination: Coordination normal.    ED Results / Procedures / Treatments   Labs (all labs ordered are listed, but only abnormal results are displayed) Labs Reviewed  BASIC METABOLIC PANEL - Abnormal; Notable for the following components:      Result Value   Calcium 8.8 (*)    All other components within normal limits  CBC WITH DIFFERENTIAL/PLATELET - Abnormal; Notable for the following components:   Hemoglobin 11.4 (*)    HCT 35.9 (*)    MCH 25.4 (*)    All other components within normal limits    EKG None  Radiology CT Orbits W Contrast  Result Date: 06/03/2021 CLINICAL DATA:  Redness and swelling for 5 days, concern for left orbital cellulitis EXAM: CT ORBITS WITH CONTRAST TECHNIQUE: Multidetector CT images was performed according to the standard protocol following intravenous contrast  administration. RADIATION DOSE REDUCTION: This exam was performed according to the departmental dose-optimization program which includes automated exposure control, adjustment of the mA and/or kV according to patient size and/or use of iterative reconstruction technique. CONTRAST:  25mL OMNIPAQUE IOHEXOL 300 MG/ML  SOLN COMPARISON:  No pertinent prior exam. FINDINGS: Orbits: The globes are intact bilaterally. The extraocular muscles are normal. Orbital fat is normal bilaterally with no evidence of inflammation. The optic nerves are unremarkable. Visible paranasal sinuses: There is mild mucosal thickening along the floor of the left maxillary sinus. Soft tissues: The soft tissues are unremarkable. There is no significant periorbital soft tissue swelling. Osseous: There is no acute osseous abnormality or aggressive osseous lesion. Limited intracranial: The imaged portions of the intracranial compartment are unremarkable. IMPRESSION: Unremarkable CT of the orbits.  No evidence of orbital cellulitis. Electronically Signed   By: Lesia Hausen M.D.   On: 06/03/2021 14:30    Procedures Procedures    Medications Ordered in ED Medications  tetracaine (PONTOCAINE) 0.5 % ophthalmic solution 2 drop (2 drops Both Eyes Given 06/03/21 1252)  fluorescein ophthalmic strip 1 strip (1 strip Both Eyes Given 06/03/21 1335)  iohexol (OMNIPAQUE) 300 MG/ML solution 75 mL (75 mLs Intravenous Contrast Given 06/03/21 1348)  erythromycin ophthalmic ointment (1 application Both Eyes Given 06/03/21 1447)    ED Course/ Medical Decision Making/ A&P                           Medical Decision Making Amount and/or Complexity of Data Reviewed Labs: ordered. Radiology: ordered.  Risk Prescription drug management.   This patient presents to the ED for concern of eye pain, this involves an extensive number of treatment options, and is a complaint that carries with it a high risk of complications and morbidity.  The differential diagnosis  includes conjunctivitis, preseptal cellulitis, orbital cellulitis, foreign body, other   Co morbidities that complicate the patient evaluation: none   Additional history obtained: -External records from outside source obtained and reviewed including: Chart review including previous notes, labs, imaging, consultation notes   Lab Tests: -I ordered, reviewed, and interpreted labs.  The pertinent results include: BMP without any AKI, CBC without leukocytosis or anemia    Imaging Studies ordered: -I ordered imaging studies including CT orbit -I independently visualized and interpreted imaging which showed no underlying preseptal or orbital cellulitis -I agree with the radiologist interpretation   Medicines ordered and prescription drug management: -I ordered medication including erythromycin ointment for bacterial conjunctivitis -Reevaluation of the patient after these medicines showed  that the patient stayed the same -I have reviewed the patients home medicines and have made adjustments as needed    ED Course: Patient is a 26 year old female presenting with bilateral injected conjunctive on clear drainage.  EOMI, there is pain with upward, downward and left gaze.  There is some chemosis and upper lid edema, given weakness symptoms of nutritional proceed with CT to evaluate for preseptal or orbital cellulitis.  Patient is in agreement.  On fluorescein there is no evidence of corneal abrasion.  Visual acuity intact.  The CT did not show any evidence of preseptal cellulitis so we will proceed to treat with erythromycin ointment.  Return precaution discussed, discharged in stable condition.   Reevaluation: After the interventions noted above, I reevaluated the patient and found that they have :stayed the same   Dispostion: D/C         Final Clinical Impression(s) / ED Diagnoses Final diagnoses:  Acute bacterial conjunctivitis of both eyes    Rx / DC Orders ED Discharge  Orders     None         Theron Arista, PA-C 06/03/21 1837    Vanetta Mulders, MD 06/06/21 [phone]

## 2021-06-06 ENCOUNTER — Telehealth: Payer: Self-pay

## 2021-06-06 NOTE — Telephone Encounter (Signed)
Transition Care Management Unsuccessful Follow-up Telephone Call ° °Date of discharge and from where:  06/03/2021 from [facility] ° °Attempts:  1st Attempt ° °Reason for unsuccessful TCM follow-up call:  Left voice message ° °  °

## 2021-06-07 NOTE — Telephone Encounter (Signed)
Transition Care Management Unsuccessful Follow-up Telephone Call ° °Date of discharge and from where:  06/03/2021 from [facility] ° °Attempts:  2nd Attempt ° °Reason for unsuccessful TCM follow-up call:  Left voice message °

## 2021-06-08 NOTE — Telephone Encounter (Signed)
Transition Care Management Unsuccessful Follow-up Telephone Call ° °Date of discharge and from where:  06/03/2021 from [facility] ° °Attempts:  3rd Attempt ° °Reason for unsuccessful TCM follow-up call:  Unable to reach patient ° ° ° °

## 2021-06-13 ENCOUNTER — Other Ambulatory Visit: Payer: Self-pay | Admitting: Obstetrics & Gynecology

## 2021-06-13 ENCOUNTER — Other Ambulatory Visit: Payer: Medicaid Other

## 2021-06-13 DIAGNOSIS — O020 Blighted ovum and nonhydatidiform mole: Secondary | ICD-10-CM | POA: Diagnosis not present

## 2021-06-14 LAB — BETA HCG QUANT (REF LAB): hCG Quant: <1 m[IU]/mL

## 2021-07-06 ENCOUNTER — Encounter: Payer: Self-pay | Admitting: Obstetrics & Gynecology

## 2021-07-08 ENCOUNTER — Other Ambulatory Visit: Payer: Medicaid Other

## 2021-07-08 DIAGNOSIS — Z8759 Personal history of other complications of pregnancy, childbirth and the puerperium: Secondary | ICD-10-CM

## 2021-07-09 LAB — BETA HCG QUANT (REF LAB): hCG Quant: <1 m[IU]/mL

## 2021-08-10 ENCOUNTER — Other Ambulatory Visit: Payer: Medicaid Other

## 2021-08-10 DIAGNOSIS — O020 Blighted ovum and nonhydatidiform mole: Secondary | ICD-10-CM | POA: Diagnosis not present

## 2021-08-11 LAB — BETA HCG QUANT (REF LAB): hCG Quant: <1 m[IU]/mL

## 2022-07-24 IMAGING — US US ABDOMEN LIMITED
1 series · 15 of 25 positions shown · non-contrast
Comparison: 09/17/2019

CLINICAL DATA: Elevated LFTs, nausea, vomiting 14 weeks, 3 days
pregnant, hyperemesis gravidarum

EXAM:
ULTRASOUND ABDOMEN LIMITED RIGHT UPPER QUADRANT

[Series 1: us abdomen limited · 15 of 34 slices shown]
[im 1/34]
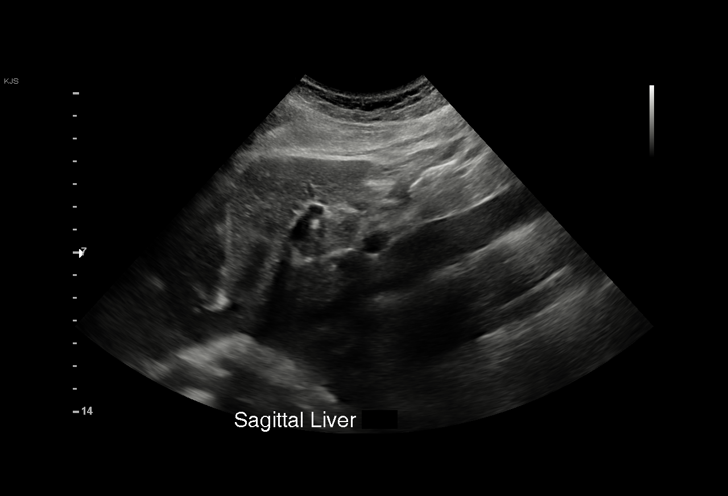
[im 3/34]
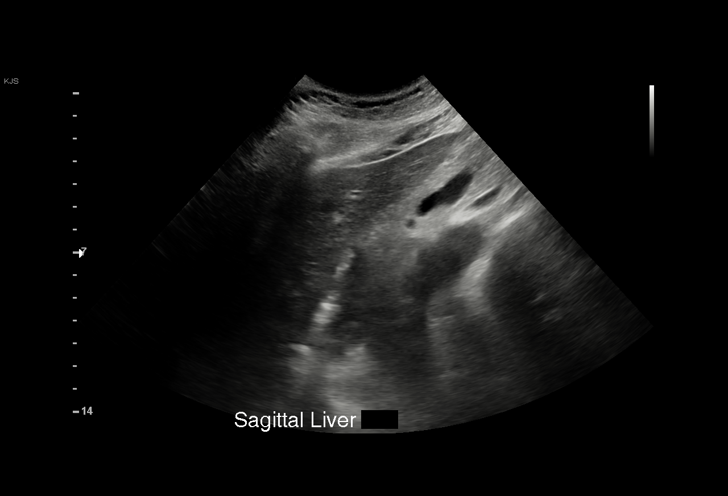
[im 6/34]
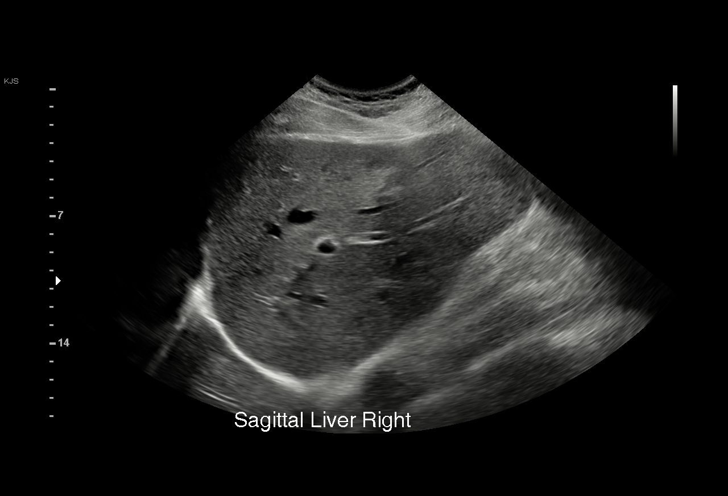
[im 7/34]
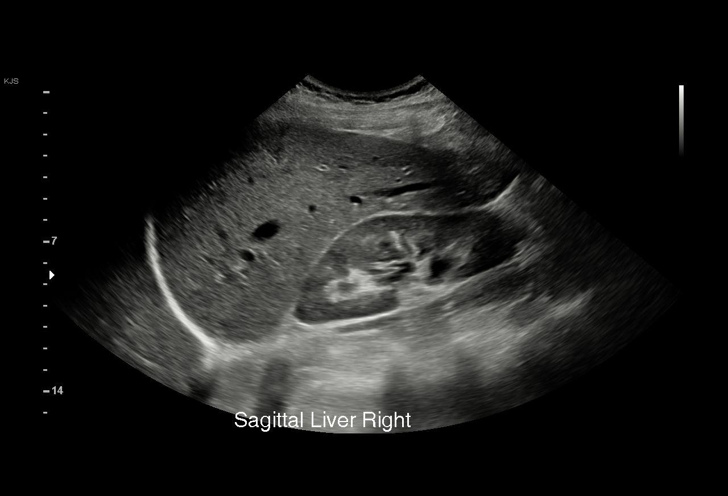
[im 10/34]
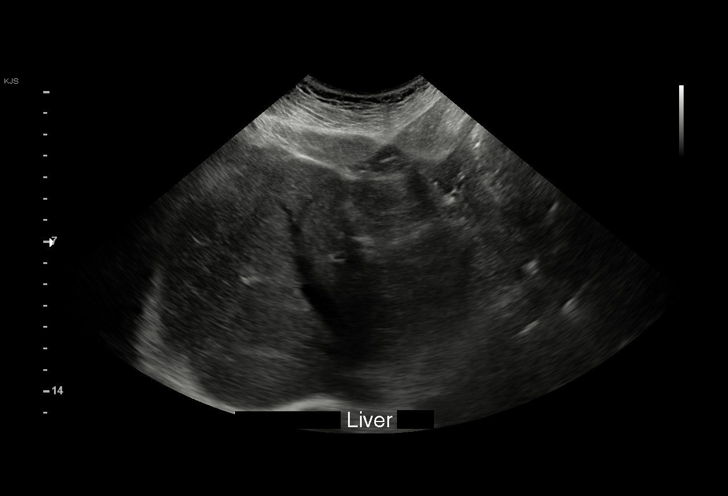
[im 13/34]
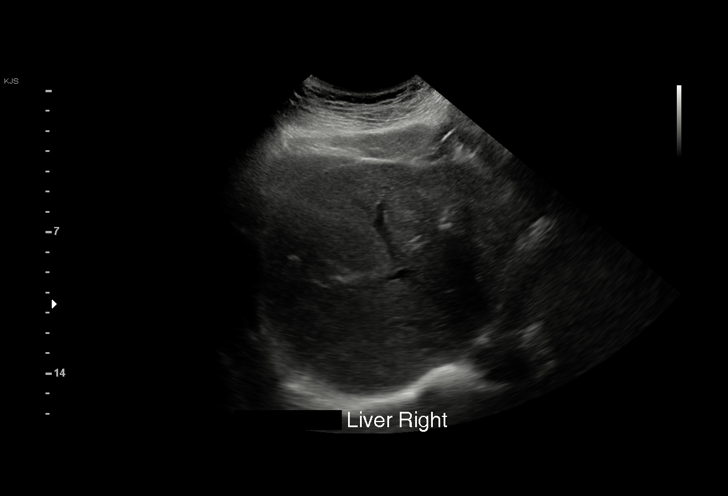
[im 14/34]
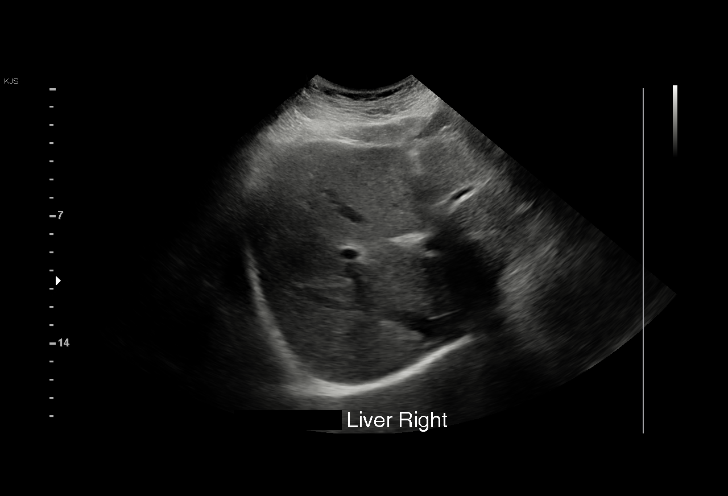
[im 17/34]
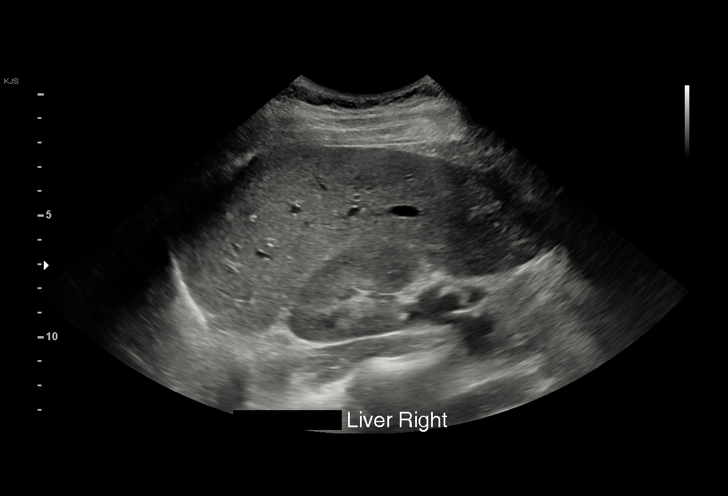
[im 20/34]
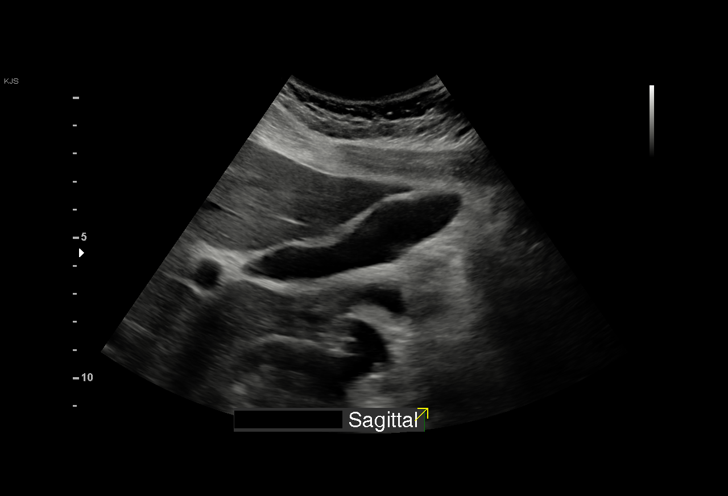
[im 21/34]
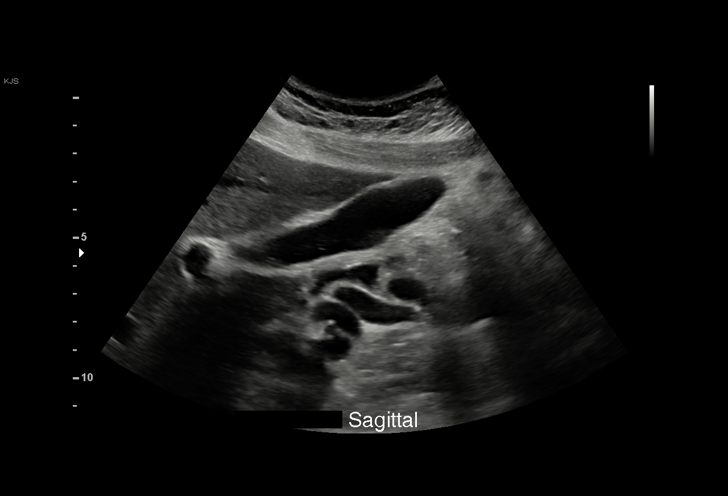
[im 24/34]
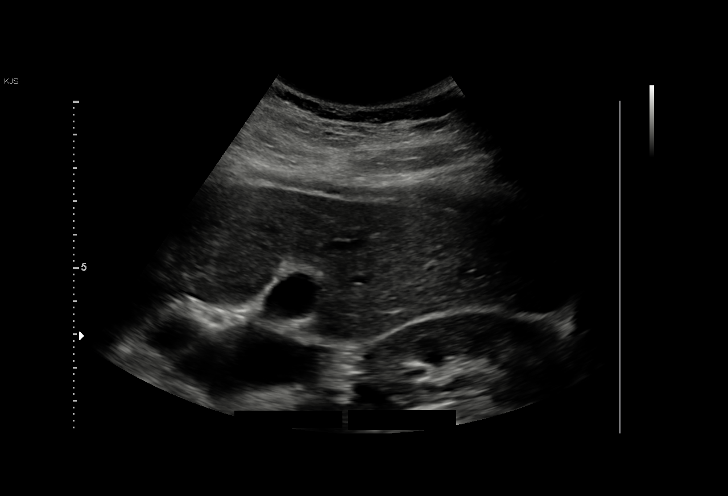
[im 27/34]
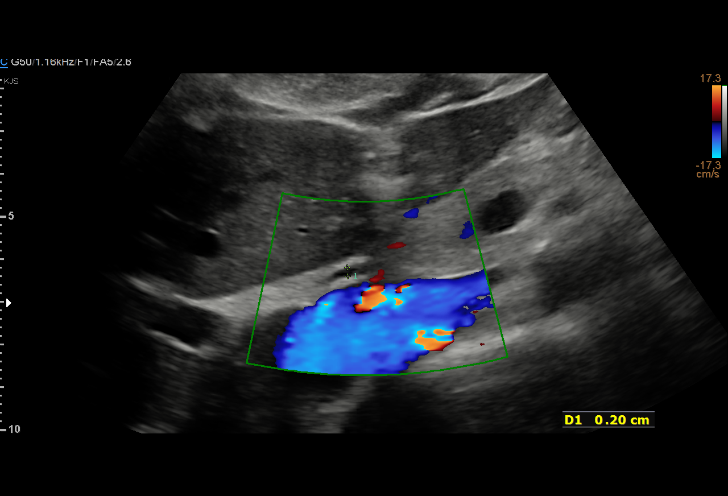
[im 28/34]
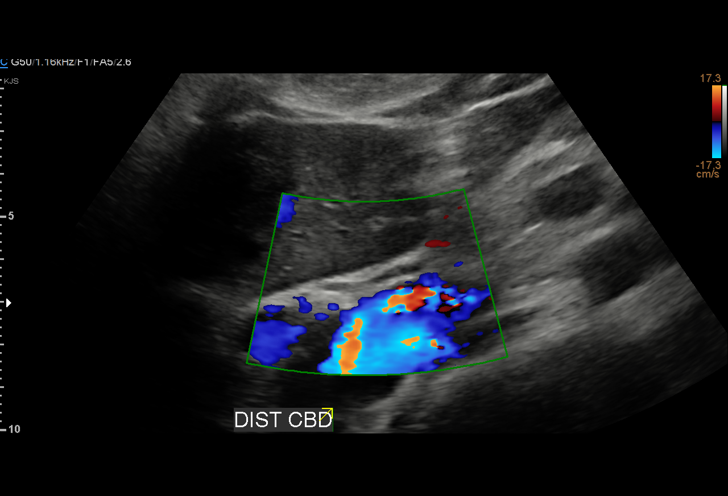
[im 31/34]
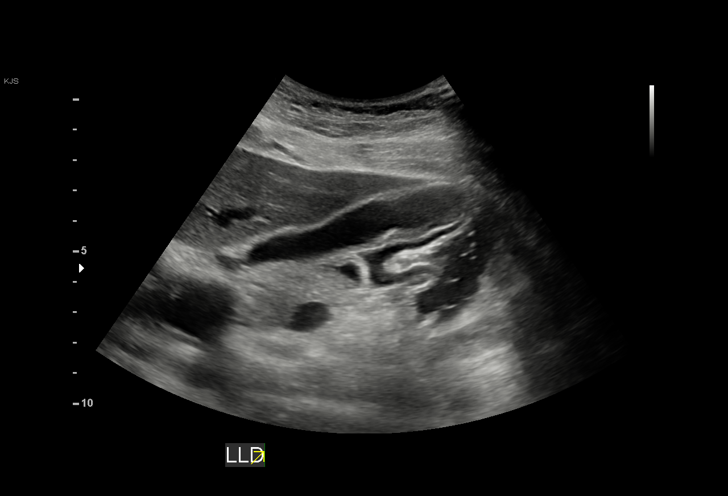
[im 34/34]
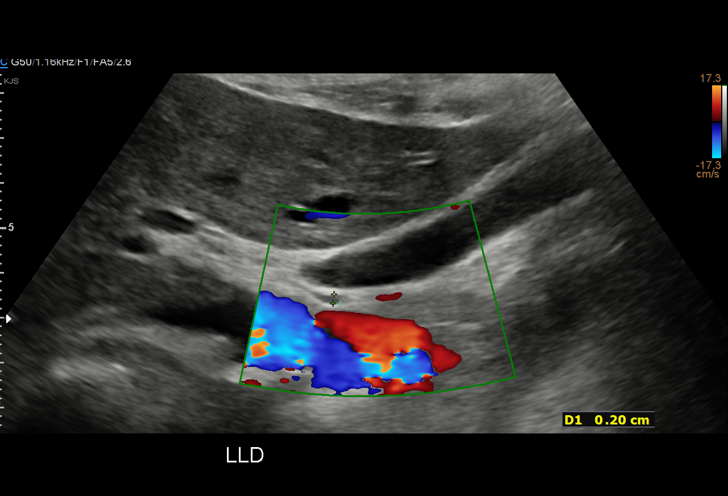

[15 of 25 positions shown; findings below may reference images not displayed]

FINDINGS: Gallbladder:

Tiny echogenic stones admixed with biliary sludge seen within the
gallbladder lumen. No significant gallbladder wall thickening.
Generalized abdominal pain with noted by sonographer but without a
focal sonographic Murphy sign elicited on exam.

Common bile duct:

Diameter: 2.4 mm

Liver:

No focal lesion identified. Within normal limits in parenchymal
echogenicity. No intrahepatic ductal dilation. Portal vein is patent
on color Doppler imaging with normal direction of blood flow towards
the liver.

Other: Technically challenging exam due to patient discomfort.
Unable to perform breath hold. Patient with emesis during exam.
IMPRESSION: Technically challenging exam due to patient discomfort and inability
to perform breath hold as well as patient nausea and emesis.

Cholelithiasis and biliary sludge without convincing sonographic
evidence of acute cholecystitis.

Otherwise unremarkable right upper quadrant ultrasound.

## 2022-07-29 IMAGING — MR MR MRCP
10 of 14 series · 39 of 48 positions shown · non-contrast
Comparison: Ultrasound 11/19/2020

CLINICAL DATA: Abdominal pain, cholelithiasis and intractable
emesis.

EXAM:
MRI ABDOMEN WITHOUT CONTRAST  (INCLUDING MRCP)
TECHNIQUE: Multiplanar multisequence MR imaging of the abdomen was performed.
Heavily T2-weighted images of the biliary and pancreatic ducts were
obtained, and three-dimensional MRCP images were rendered by post
processing.

[Series 5: cor haste · coronal · 6.0mm · 1.25mm/px · 2 of 26 slices shown]
[im 1/26]
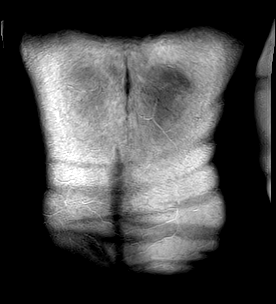
[im 26/26]
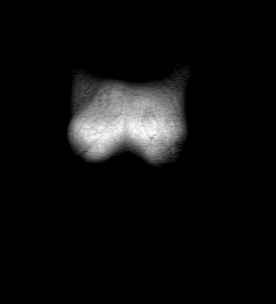

[Series 6: ax haste · axial · 6.0mm · 1.18mm/px · z∈[-269,-31]mm · 3 of 34 slices shown]
[im 1/34]
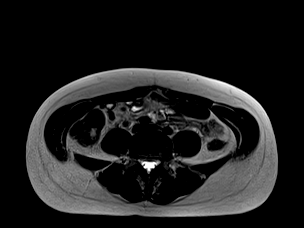
[im 17/34]
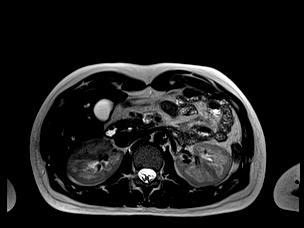
[im 34/34]
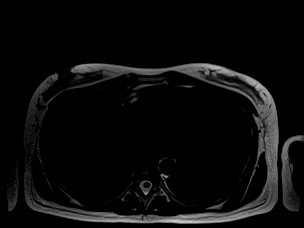

[Series 7: T2 fat-sat · axial · 6.0mm · 1.41mm/px · z∈[-269,-31]mm · 3 of 34 slices shown]
[im 1/34]
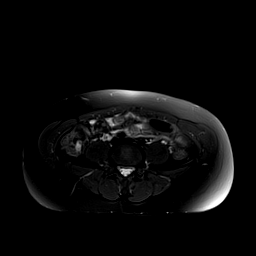
[im 17/34]
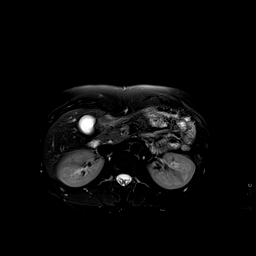
[im 34/34]
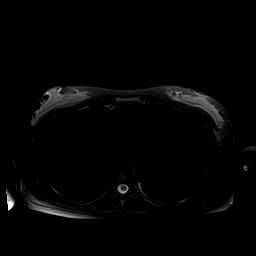

[Series 8: DWI · axial · 6.0mm · 1.34mm/px · z∈[-269,-31]mm · 8 of 102 slices shown (1 of 2)]
[im 1/102]
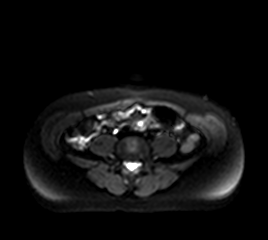
[im 15/102]
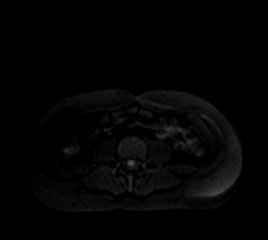
[im 29/102]
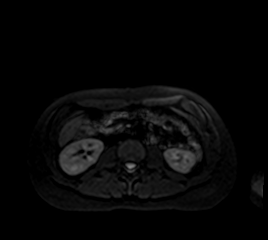
[im 44/102]
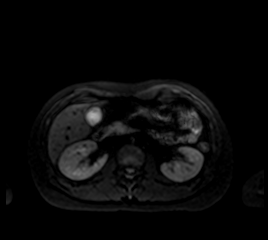
[im 58/102]
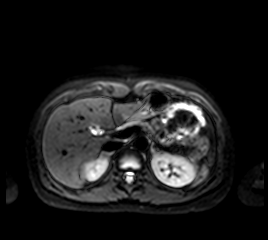
[im 73/102]
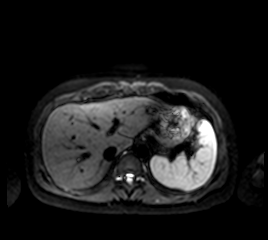
[im 87/102]
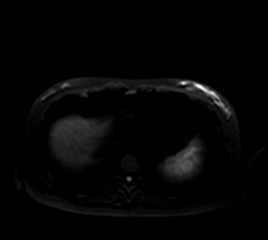
[im 102/102]
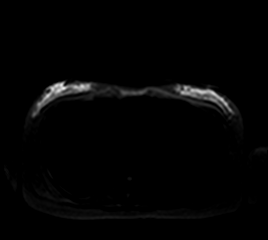

[Series 9: DWI · axial · 6.0mm · 1.34mm/px · z∈[-269,-31]mm · 3 of 34 slices shown (2 of 2)]
[im 1/34]
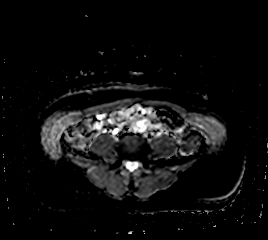
[im 17/34]
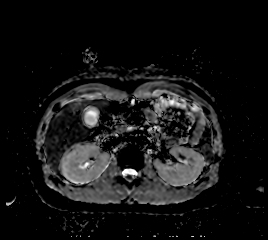
[im 34/34]
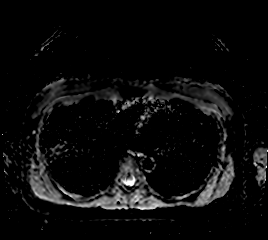

[Series 10: ax in and · axial · 3.0mm · 1.12mm/px · z∈[-273,-36]mm · 6 of 80 slices shown (1 of 2)]
[im 1/80]
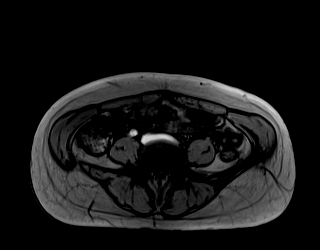
[im 16/80]
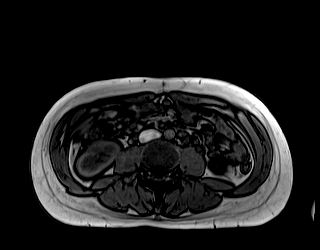
[im 32/80]
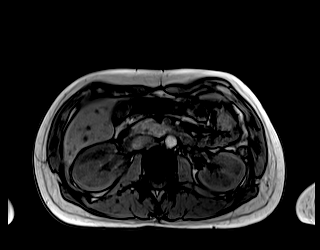
[im 48/80]
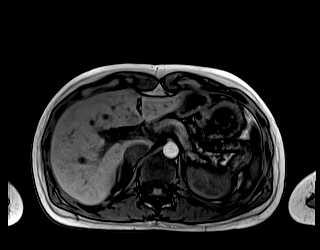
[im 64/80]
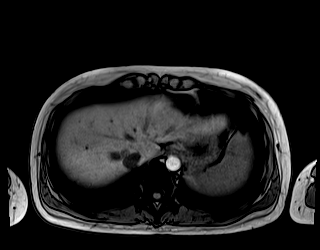
[im 80/80]
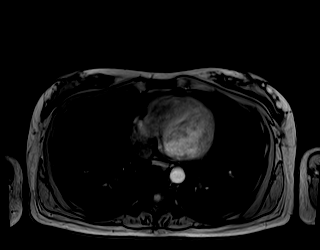

[Series 10: ax in and · axial · 3.0mm · 1.12mm/px · z∈[-273,-36]mm · 6 of 80 slices shown (2 of 2)]
[im 1/80]
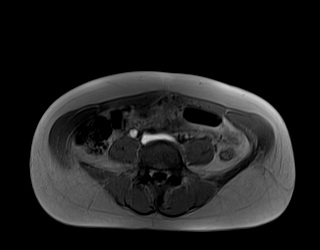
[im 16/80]
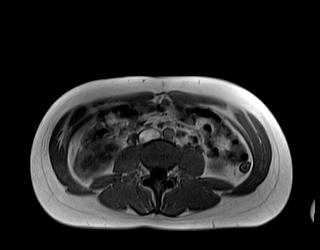
[im 32/80]
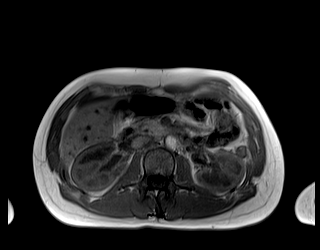
[im 48/80]
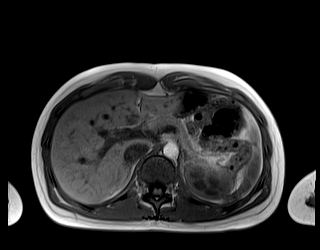
[im 64/80]
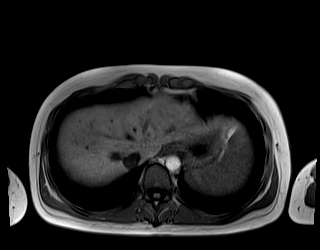
[im 80/80]
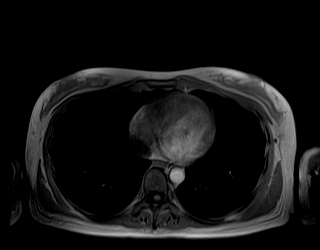

[Series 15: MRCP · coronal · 4.0mm · 1.12mm/px · 1 of 15 slices shown]
[im 1/15]
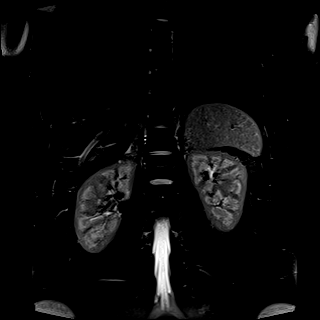

[Series 16: radials · coronal · 50.0mm · 0.78mm/px · 1 of 5 slices shown]
[im 1/5]
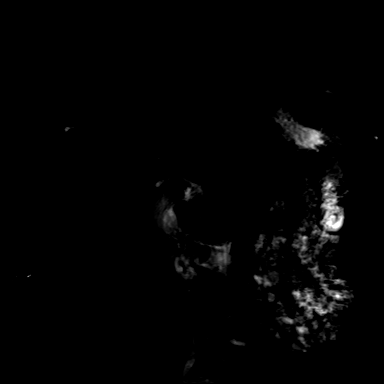

[Series 17: T1 dynamic · axial · non-contrast · 3.0mm · 1.19mm/px · z∈[-273,-36]mm · 6 of 80 slices shown]
[im 1/80]
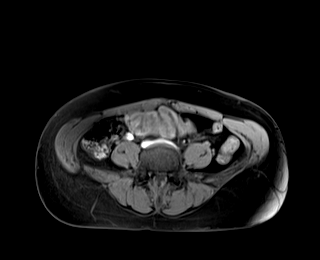
[im 16/80]
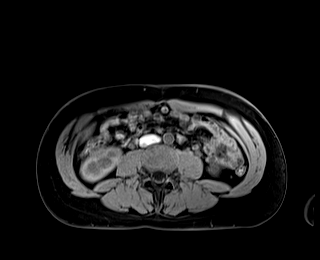
[im 32/80]
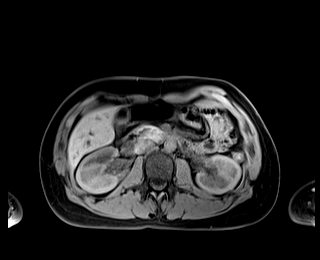
[im 48/80]
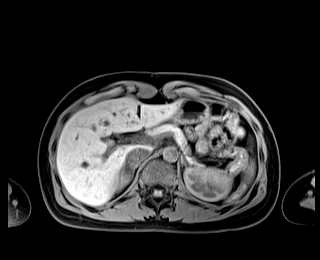
[im 64/80]
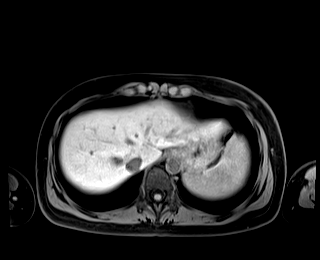
[im 80/80]
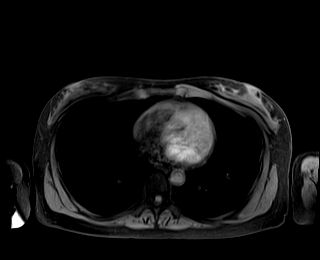

[39 of 48 positions shown; findings below may reference images not displayed]

FINDINGS: Lower chest: Lung bases are clear. Normal heart size. No pericardial
effusion.

Hepatobiliary: Normal intrinsic liver signal. No significant signal
dropout on in and out of phase imaging. No visible concerning focal
liver lesion. Smooth liver surface contour.

No significant gallbladder wall thickening. No pericholecystic fluid
or edematous signal. Gradient signal within the gallbladder lumen
compatible with some layering biliary sludge. No angular filling
defects are identified within the gallbladder lumen or imaged
biliary tree. Intra and extrahepatic biliary caliber is within
normal limits.

Pancreas: No pancreatic ductal dilatation or surrounding
inflammatory changes.

Spleen:  Normal in size. No concerning splenic lesions.

Adrenals/Urinary Tract: Normal adrenals. No worrisome renal masses.
No evidence of hydronephrosis.

Stomach/Bowel: Visualized portions within the abdomen are
unremarkable. No evidence of bowel obstruction. No worrisome
mesenteric findings.

Vascular/Lymphatic: No pathologically enlarged lymph nodes
identified. No abdominal aortic aneurysm demonstrated.

Other: Wide field of view images demonstrated gravid uterus,
incompletely assessed on this exam of the abdomen. No visible free
intraperitoneal fluid. No bowel containing hernias.

Musculoskeletal: Osseous structures are unremarkable with grossly
normal marrow signal. Normal cord signal.
IMPRESSION: 1. Gradient signal within the gallbladder lumen, compatible with
biliary sludge.
2. No discernible angular filling defects to suggest cholelithiasis
or choledocholithiasis.
3. No MR evidence of cholecystitis or biliary ductal dilatation.
4. Gravid uterus partially visualized.
5. Otherwise unremarkable abdominal MR.

## 2022-08-28 IMAGING — US US PELVIS COMPLETE WITH TRANSVAGINAL
1 series · 15 of 25 positions shown · non-contrast
Comparison: None

CLINICAL DATA: Partial molar pregnancy with retained products of
conception. TAB 2 weeks ago. Beta hcg 615.



[Series 1: us pelvis complete with transvaginal · 15 of 80 slices shown]
[im 1/80]
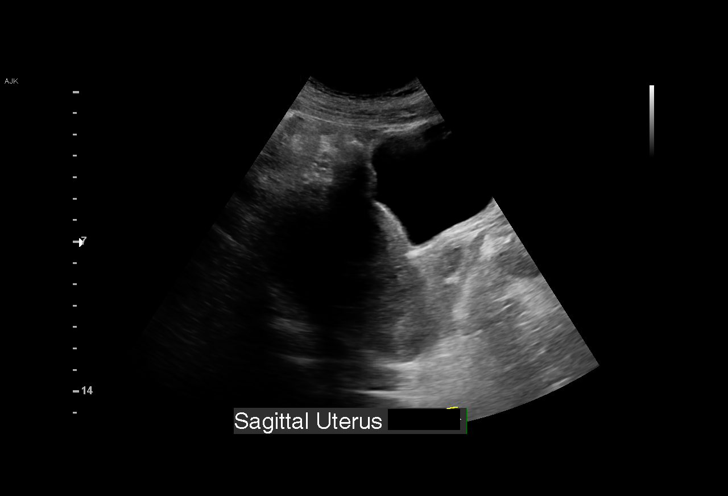
[im 7/80]
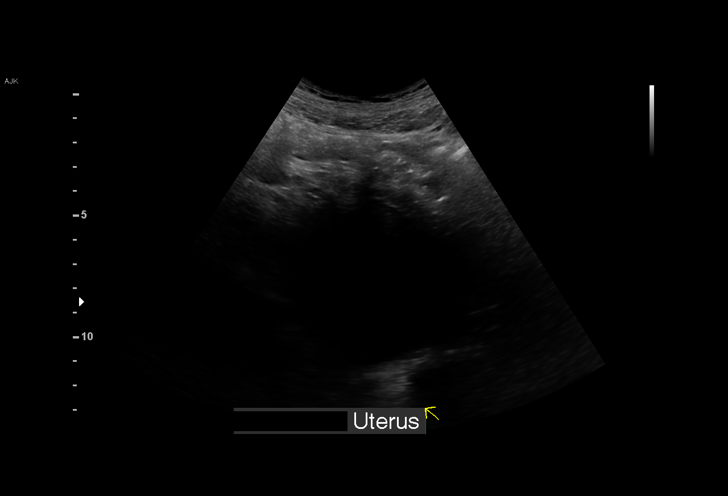
[im 14/80]
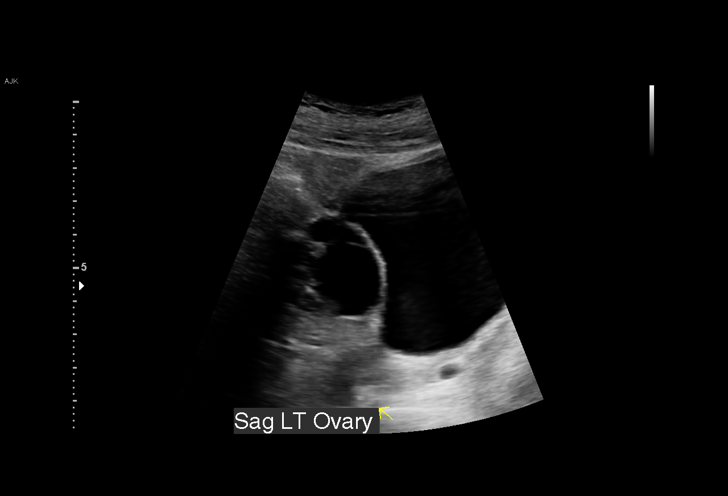
[im 17/80]
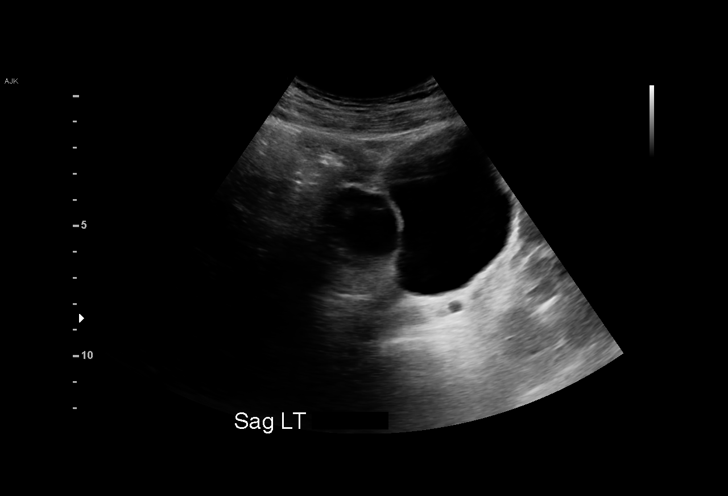
[im 24/80]
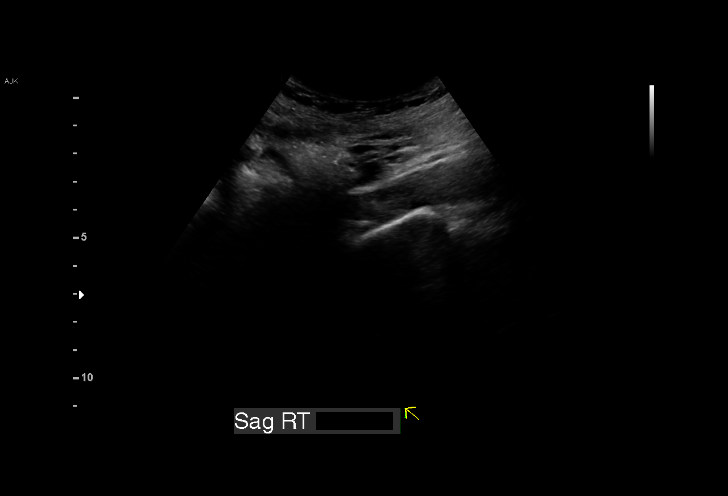
[im 30/80]
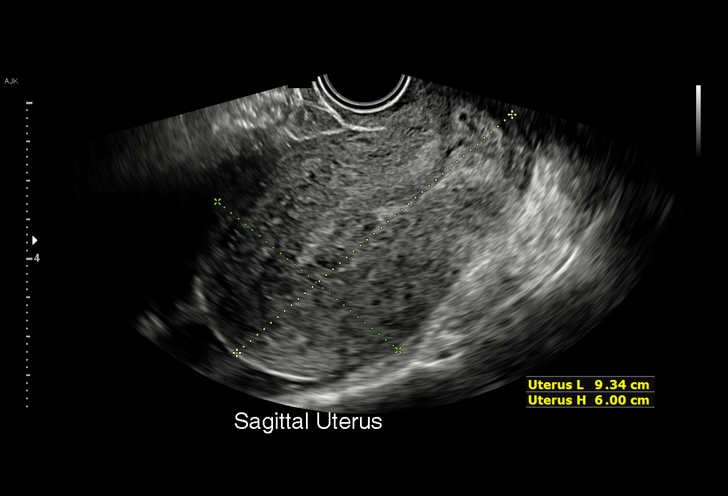
[im 33/80]
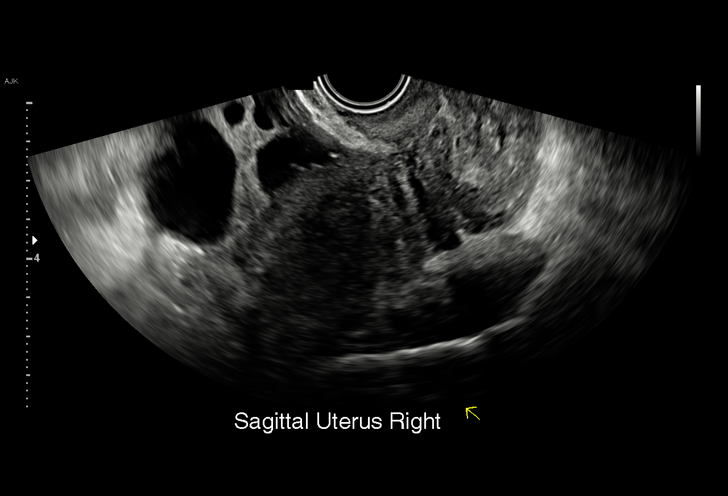
[im 40/80]
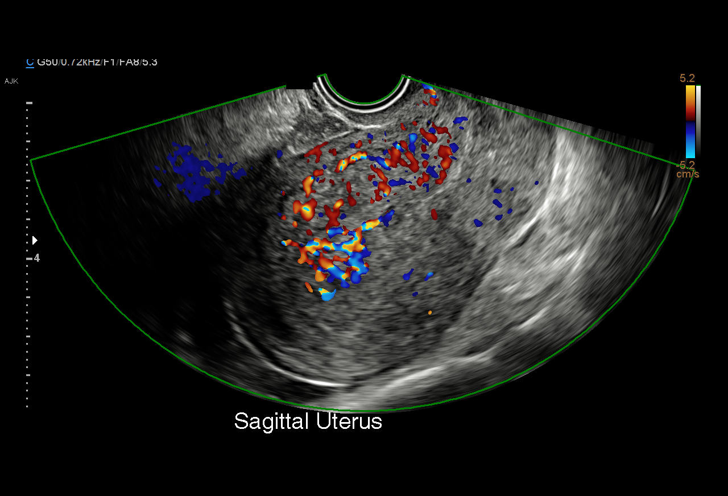
[im 47/80]
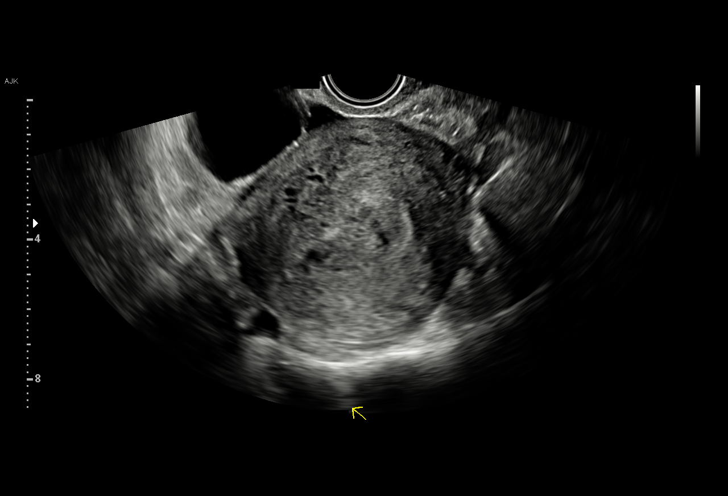
[im 50/80]
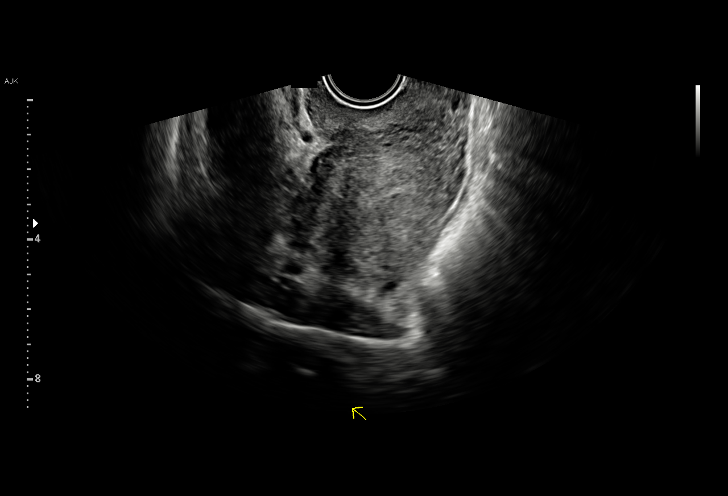
[im 56/80]
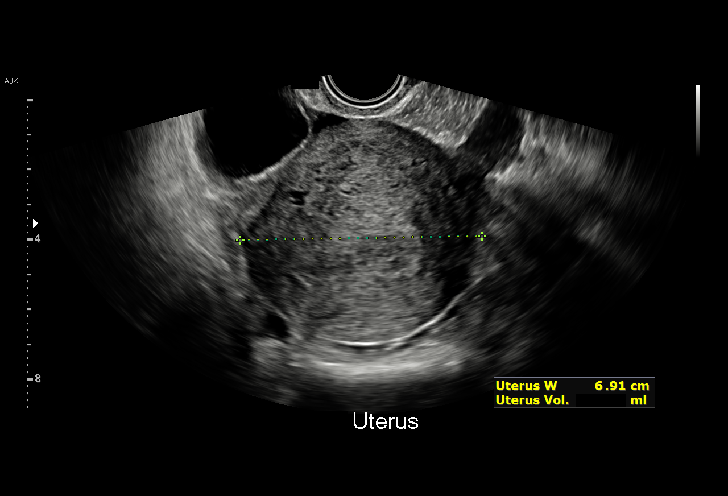
[im 63/80]
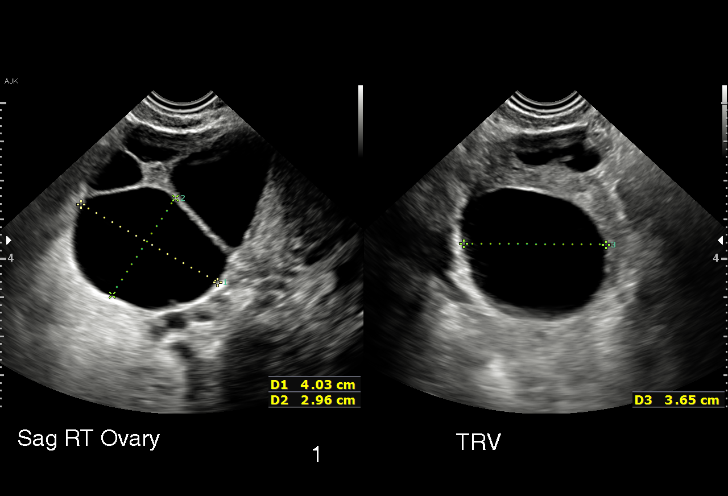
[im 66/80]
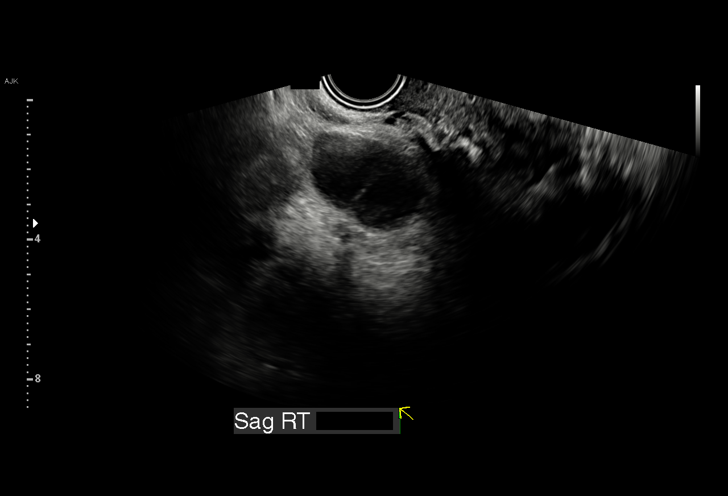
[im 73/80]
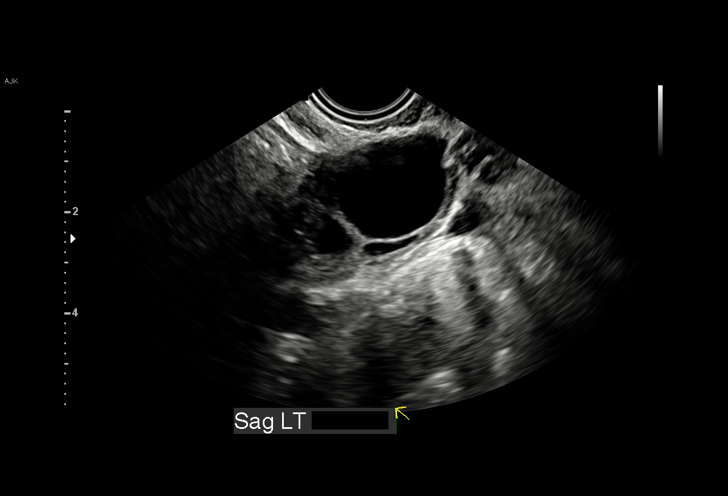
[im 80/80]
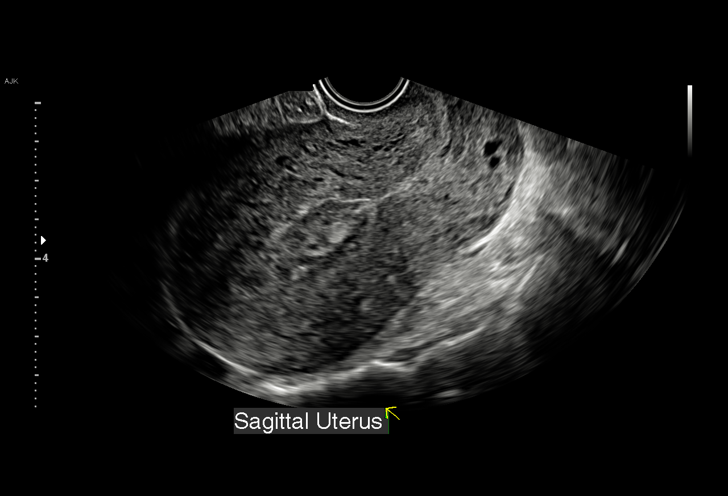

[15 of 25 positions shown; findings below may reference images not displayed]

FINDINGS: Uterus

Measurements: 9.3 x 6 x 6.9 cm = volume: 203 mL. No fibroids or
other mass visualized.

Endometrium

Thickness: 16 mm.  Heterogeneous and hypervascular.

Right ovary

Measurements: 5.4 x 4.2 x 3.9 cm = volume: 46 mL. Multiple cysts
noted largest measuring 4 x 3 x 3.7 cm.

Left ovary

Measurements: 4.2 x 2.6 x 3 cm = volume: 17 mL. Normal appearance/no
adnexal mass.

Other findings

No abnormal free fluid.
IMPRESSION: 1. Hypervascular heterogeneous thickened endometrium measuring up to
16 mm. Finding likely represents retained products of conception.
Differential diagnosis includes gestational trophoblastic neoplasm
versus endometritis.
2. Right simple ovarian cysts measuring up to 4 cm. Theca lutein
cysts not excluded. Recommend short-term (4-6 weeks) repeat
ultrasound for evaluation of resolution.

## 2022-11-20 ENCOUNTER — Emergency Department (HOSPITAL_COMMUNITY): Payer: Self-pay

## 2022-11-20 ENCOUNTER — Other Ambulatory Visit: Payer: Self-pay

## 2022-11-20 ENCOUNTER — Emergency Department (HOSPITAL_COMMUNITY)
Admission: EM | Admit: 2022-11-20 | Discharge: 2022-11-20 | Disposition: A | Discharge status: Home / Self Care | Payer: Self-pay | Attending: Emergency Medicine | Admitting: Emergency Medicine

## 2022-11-20 ENCOUNTER — Encounter (HOSPITAL_COMMUNITY): Payer: Self-pay

## 2022-11-20 DIAGNOSIS — R202 Paresthesia of skin: Secondary | ICD-10-CM | POA: Insufficient documentation

## 2022-11-20 DIAGNOSIS — I10 Essential (primary) hypertension: Secondary | ICD-10-CM | POA: Diagnosis not present

## 2022-11-20 DIAGNOSIS — M25531 Pain in right wrist: Secondary | ICD-10-CM | POA: Insufficient documentation

## 2022-11-20 DIAGNOSIS — Z79899 Other long term (current) drug therapy: Secondary | ICD-10-CM | POA: Diagnosis not present

## 2022-11-20 DIAGNOSIS — M25512 Pain in left shoulder: Secondary | ICD-10-CM | POA: Diagnosis not present

## 2022-11-20 DIAGNOSIS — Y92410 Unspecified street and highway as the place of occurrence of the external cause: Secondary | ICD-10-CM | POA: Insufficient documentation

## 2022-11-20 DIAGNOSIS — E039 Hypothyroidism, unspecified: Secondary | ICD-10-CM | POA: Insufficient documentation

## 2022-11-20 MED ORDER — IBUPROFEN 600 MG PO TABS: 600.0000 mg | ORAL_TABLET | Freq: Four times a day (QID) | ORAL | 0 refills | Status: AC | PRN

## 2022-11-20 MED ORDER — CYCLOBENZAPRINE HCL 10 MG PO TABS: 10.0000 mg | ORAL_TABLET | Freq: Two times a day (BID) | ORAL | 0 refills | Status: AC | PRN
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
## 2022-11-20 NOTE — Discharge Instructions (Addendum)
As discussed, workup today overall reassuring.  X-ray images were negative for any fracture/dislocation.  Will prescribe medicine called Motrin to take as needed for pain/inflammation.  Recommend icing affected areas.  Expect pain to worsen over the next 2 to 3 days before begins to get better.  Recommend follow-up with primary care for reassessment of your symptoms.  Please not hesitate to return to emergency department for worrisome signs and symptoms we discussed become apparent.

## 2022-11-20 NOTE — ED Notes (Signed)
Patient transported to X-ray 
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
## 2023-08-17 ENCOUNTER — Encounter (HOSPITAL_COMMUNITY): Payer: Self-pay | Admitting: *Deleted

## 2023-08-17 ENCOUNTER — Other Ambulatory Visit: Payer: Self-pay

## 2023-08-17 ENCOUNTER — Emergency Department (HOSPITAL_COMMUNITY)
Admission: EM | Admit: 2023-08-17 | Discharge: 2023-08-17 | Disposition: A | Discharge status: Home / Self Care | Payer: Self-pay | Attending: Emergency Medicine | Admitting: Emergency Medicine

## 2023-08-17 DIAGNOSIS — R197 Diarrhea, unspecified: Secondary | ICD-10-CM | POA: Insufficient documentation

## 2023-08-17 DIAGNOSIS — R112 Nausea with vomiting, unspecified: Secondary | ICD-10-CM | POA: Insufficient documentation

## 2023-08-17 DIAGNOSIS — E039 Hypothyroidism, unspecified: Secondary | ICD-10-CM | POA: Insufficient documentation

## 2023-08-17 DIAGNOSIS — I10 Essential (primary) hypertension: Secondary | ICD-10-CM | POA: Insufficient documentation

## 2023-08-17 DIAGNOSIS — R109 Unspecified abdominal pain: Secondary | ICD-10-CM | POA: Insufficient documentation

## 2023-08-17 LAB — COMPREHENSIVE METABOLIC PANEL WITH GFR
ALT: 14 U/L (ref 0–44)
AST: 23 U/L (ref 15–41)
Albumin: 4.1 g/dL (ref 3.5–5.0)
Alkaline Phosphatase: 69 U/L (ref 38–126)
Anion gap: 12 (ref 5–15)
BUN: 13 mg/dL (ref 6–20)
CO2: 18 mmol/L — ABNORMAL LOW (ref 22–32)
Calcium: 8.8 mg/dL — ABNORMAL LOW (ref 8.9–10.3)
Chloride: 102 mmol/L (ref 98–111)
Creatinine, Ser: 0.71 mg/dL (ref 0.44–1.00)
GFR, Estimated: >60 mL/min (ref >60)
Glucose, Bld: 107 mg/dL — ABNORMAL HIGH (ref 70–99)
Potassium: 3.9 mmol/L (ref 3.5–5.1)
Sodium: 132 mmol/L — ABNORMAL LOW (ref 135–145)
Total Bilirubin: 1 mg/dL (ref 0.0–1.2)
Total Protein: 7.7 g/dL (ref 6.5–8.1)

## 2023-08-17 LAB — CBC WITH DIFFERENTIAL/PLATELET
Abs Immature Granulocytes: 0.02 10*3/uL (ref 0.00–0.07)
Basophils Absolute: 0 10*3/uL (ref 0.0–0.1)
Basophils Relative: 0 %
Eosinophils Absolute: 0 10*3/uL (ref 0.0–0.5)
Eosinophils Relative: 0 %
HCT: 38.8 % (ref 36.0–46.0)
Hemoglobin: 12 g/dL (ref 12.0–15.0)
Immature Granulocytes: 1 %
Lymphocytes Relative: 18 %
Lymphs Abs: 0.8 10*3/uL (ref 0.7–4.0)
MCH: 24.3 pg — ABNORMAL LOW (ref 26.0–34.0)
MCHC: 30.9 g/dL (ref 30.0–36.0)
MCV: 78.7 fL — ABNORMAL LOW (ref 80.0–100.0)
Monocytes Absolute: 0.4 10*3/uL (ref 0.1–1.0)
Monocytes Relative: 8 %
Neutro Abs: 3.2 10*3/uL (ref 1.7–7.7)
Neutrophils Relative %: 73 %
Platelets: 255 10*3/uL (ref 150–400)
RBC: 4.93 MIL/uL (ref 3.87–5.11)
RDW: 14.8 % (ref 11.5–15.5)
WBC: 4.4 10*3/uL (ref 4.0–10.5)
nRBC: 0 % (ref 0.0–0.2)

## 2023-08-17 LAB — PREGNANCY, URINE: Preg Test, Ur: NEGATIVE

## 2023-08-17 LAB — URINALYSIS, ROUTINE W REFLEX MICROSCOPIC
Bacteria, UA: NONE SEEN
Bilirubin Urine: NEGATIVE
Glucose, UA: NEGATIVE mg/dL
Hgb urine dipstick: NEGATIVE
Ketones, ur: 80 mg/dL — AB
Nitrite: NEGATIVE
Protein, ur: 100 mg/dL — AB
Specific Gravity, Urine: 1.028 (ref 1.005–1.030)
pH: 5 (ref 5.0–8.0)

## 2023-08-17 LAB — LIPASE, BLOOD: Lipase: 27 U/L (ref 11–51)

## 2023-08-17 MED ORDER — ONDANSETRON HCL 4 MG/2ML IJ SOLN: 4.0000 mg | Freq: Once | INTRAMUSCULAR | Status: DC

## 2023-08-17 MED ORDER — LOPERAMIDE HCL 2 MG PO CAPS
4.0000 mg | ORAL_CAPSULE | Freq: Once | ORAL | Status: AC
  Administered 2023-08-17: 4 mg via ORAL
  Filled 2023-08-17: qty 2

## 2023-08-17 MED ORDER — METOCLOPRAMIDE HCL 10 MG PO TABS
10.0000 mg | ORAL_TABLET | Freq: Once | ORAL | Status: AC
  Administered 2023-08-17: 10 mg via ORAL
  Filled 2023-08-17: qty 1

## 2023-08-17 MED ORDER — ONDANSETRON 4 MG PO TBDP
8.0000 mg | ORAL_TABLET | Freq: Once | ORAL | Status: AC
  Administered 2023-08-17: 8 mg via ORAL
  Filled 2023-08-17: qty 2

## 2023-08-17 MED ORDER — ONDANSETRON 4 MG PO TBDP: 4.0000 mg | ORAL_TABLET | Freq: Three times a day (TID) | ORAL | 0 refills | Status: AC | PRN

## 2023-08-17 MED ORDER — SODIUM CHLORIDE 0.9 % IV BOLUS: 1000.0000 mL | Freq: Once | INTRAVENOUS | Status: DC

## 2023-08-17 NOTE — ED Provider Triage Note (Signed)
 Emergency Medicine Provider Triage Evaluation Note  SINDHU NGUYEN , a 28 y.o. female  was evaluated in triage.  Pt complains of N, V, D, abd pain.  Review of Systems  Positive: As above, decreased urination Negative: Fever, hematemesis, bloody stools  Physical Exam  BP 130/81 (BP Location: Left Arm)   Pulse 99   Temp 98 F (36.7 C)   Resp 16   SpO2 100%  Gen:   Awake, no distress   Resp:  Normal effort  MSK:   Moves extremities without difficulty  Other:  Abd soft, periumbilical tenderness  Medical Decision Making  Medically screening exam initiated at 1:30 PM.  Appropriate orders placed.  Lorynn Moeser [place] was informed that the remainder of the evaluation will be completed by another provider, this initial triage assessment does not replace that evaluation, and the importance of remaining in the ED until their evaluation is complete.  2 days of symptoms as above. 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 Reports infrequent urination without dysuria.    Mandy Second, PA-C 08/17/23 1332

## 2023-08-17 NOTE — ED Provider Notes (Signed)
 [place] EMERGENCY DEPARTMENT AT [facility] Provider Note   CSN: [id number] Arrival date & time: 08/17/23  1321     History Chief Complaint  Patient presents with   Diarrhea   Abdominal Pain     Diarrhea Associated symptoms: abdominal pain   Abdominal Pain Associated symptoms: diarrhea    Deborah Evans is a 28 y.o. female presenting for nausea vomiting and diarrhea for the past 2 days.  Previous medical history of hypertension hypothyroidism associated with pregnancy.  Further noted previous history of bilateral salpingectomy.  She states the current episode began with nausea that has progressed to persistent diarrhea as well as nausea and vomiting.  When asked about the number of episodes she states that they have the number of vomiting and loose stools have been more numerous than she can remember to count.  She denies any hematochezia or melena, also denies any hematemesis.  She denies any recent sick contacts.  Prior to arriving today she states that the last time she had anything to eat or drink was yesterday evening.  States that anytime she tries to eat or drink today she becomes instantly nauseated and vomits.   Patient's recorded medical, surgical, social, medication list and allergies were reviewed in the Snapshot window as part of the initial history.   Review of Systems   Review of Systems  Gastrointestinal:  Positive for abdominal pain and diarrhea.    Physical Exam Updated Vital Signs BP 130/81 (BP Location: Left Arm)   Pulse 99   Temp 98 F (36.7 C)   Resp 16   SpO2 100%  Physical Exam   ED Course/ Medical Decision Making/ A&P    Procedures Procedures   Medications Ordered in ED Medications  metoCLOPramide  (REGLAN ) tablet 10 mg (10 mg Oral Given 08/17/23 1348)    Medical Decision Making:   [name] is a 28 y.o. female who presented to the ED today with nausea and vomiting detailed above.     Complete initial physical exam  performed, notably the patient  was diffusely tender across the entire abdomen and had hyperactive bowel sounds.    Reviewed and confirmed nursing documentation for past medical history, family history, social history.    Initial Assessment:   With the patient's presentation of nausea and vomiting, most likely diagnosis is gastroenteritis. Other diagnoses were considered including (but not limited to) C. difficile colitis. These are considered less likely due to history of present illness and physical exam findings.     Initial Plan:  Provide IV fluids and antiemetics to manage likely fluid depletion and also manage her nausea. Screening labs including CBC and Metabolic panel to evaluate for infectious or metabolic etiology of disease.  Urinalysis with reflex culture ordered to evaluate for UTI or relevant urologic/nephrologic pathology.  Objective evaluation as below reviewed   Initial Study Results:   Laboratory  All laboratory results reviewed without evidence of clinically relevant pathology.   Exceptions include: Elevated urine ketones and protein suggest potential fluid depletion.     Reassessment and Plan:   Reviewing her labs, notes that pregnancy test is negative, lipase is within normal limits, and CMP shows no acute abnormalities.  Urine findings do suggest potential for fluid depletion.  Will initially manage with IV hydration, along with IV antiemetics and p.o. antidiarrheals.   Due to patient request, will not initiate IV fluids, will manage with p.o. antiemetics and antidiarrheals.  If she can tolerate p.o. fluids will discharge patient with  instructions to follow-up with primary care.  Clinical Impression: No diagnosis found.   Data Unavailable   Final Clinical Impression(s) / ED Diagnoses Final diagnoses:  None    Rx / DC Orders ED Discharge Orders     None         Juanetta Nordmann, PA 08/17/23 1652    Early Glisson, MD 08/18/23 1008

## 2023-08-17 NOTE — ED Triage Notes (Addendum)
 Pt has been having n/v/d for 2 days.  Pt states that it has been more episodes than she can count. Pt is also having abdominal pain.

## 2023-08-17 NOTE — ED Provider Notes (Signed)
 This patient is a 28 year old female, presenting with some mild abdominal discomfort but more importantly she has had vomiting and diarrhea for the last couple of days, multiple episodes of watery nonbloody diarrhea, on my exam she has minimal tenderness in the periumbilical region but no guarding, she is not tachycardic or febrile, she states she is feeling better after taking a metoclopramide  by mouth on arrival.  Her labs are consistent with dehydration with ketones however metabolic panel CBC are unremarkable, no UTI.  Patient agreeable to discharge with oral antiemetics and refuses IV access or IV rehydration.  I think this is very reasonable as she otherwise appears well and has a nonsurgical abdomen.  I have discussed with the patient at the bedside the results, and the meaning of these results.  They have had opportunity to ask questions,  expressed their understanding to the need for follow-up with primary care physician  Medical screening examination/treatment/procedure(s) were conducted as a shared visit with non-physician practitioner(s) and myself.  I personally evaluated the patient during the encounter.  Clinical Impression:   Final diagnoses:  None         Cleotilde Rogue, MD 08/18/23 1008

## 2023-09-13 ENCOUNTER — Ambulatory Visit: Admitting: Obstetrics & Gynecology

## 2023-10-05 ENCOUNTER — Ambulatory Visit: Admitting: Obstetrics & Gynecology

## 2023-10-10 ENCOUNTER — Encounter: Admitting: Obstetrics & Gynecology

## 2024-01-21 DIAGNOSIS — Z202 Contact with and (suspected) exposure to infections with a predominantly sexual mode of transmission: Secondary | ICD-10-CM | POA: Diagnosis not present
